# Patient Record
Sex: Male | Born: 1975 | Race: Black or African American | Hispanic: No | Marital: Married | Smoking: Former smoker
Health system: Southern US, Community
[De-identification: ages and names within clinical notes are randomized; demographics above are authoritative.]

## PROBLEM LIST (undated history)

## (undated) DIAGNOSIS — I5022 Chronic systolic (congestive) heart failure: Secondary | ICD-10-CM

## (undated) DIAGNOSIS — I255 Ischemic cardiomyopathy: Secondary | ICD-10-CM

## (undated) DIAGNOSIS — Z720 Tobacco use: Secondary | ICD-10-CM

## (undated) DIAGNOSIS — E7800 Pure hypercholesterolemia, unspecified: Secondary | ICD-10-CM

## (undated) DIAGNOSIS — I2119 ST elevation (STEMI) myocardial infarction involving other coronary artery of inferior wall: Secondary | ICD-10-CM

## (undated) DIAGNOSIS — I2699 Other pulmonary embolism without acute cor pulmonale: Secondary | ICD-10-CM

## (undated) DIAGNOSIS — G4733 Obstructive sleep apnea (adult) (pediatric): Secondary | ICD-10-CM

## (undated) DIAGNOSIS — Z9889 Other specified postprocedural states: Secondary | ICD-10-CM

## (undated) DIAGNOSIS — I340 Nonrheumatic mitral (valve) insufficiency: Secondary | ICD-10-CM

## (undated) DIAGNOSIS — E785 Hyperlipidemia, unspecified: Secondary | ICD-10-CM

## (undated) DIAGNOSIS — G43909 Migraine, unspecified, not intractable, without status migrainosus: Secondary | ICD-10-CM

## (undated) DIAGNOSIS — I2129 ST elevation (STEMI) myocardial infarction involving other sites: Secondary | ICD-10-CM

## (undated) DIAGNOSIS — I2510 Atherosclerotic heart disease of native coronary artery without angina pectoris: Secondary | ICD-10-CM

## (undated) HISTORY — DX: Atherosclerotic heart disease of native coronary artery without angina pectoris: I25.10

## (undated) HISTORY — PX: OTHER SURGICAL HISTORY: SHX169

---

## 2005-01-19 DIAGNOSIS — I2119 ST elevation (STEMI) myocardial infarction involving other coronary artery of inferior wall: Secondary | ICD-10-CM

## 2005-01-19 HISTORY — DX: ST elevation (STEMI) myocardial infarction involving other coronary artery of inferior wall: I21.19

## 2008-03-31 ENCOUNTER — Ambulatory Visit: Payer: Self-pay | Admitting: Cardiology

## 2008-03-31 ENCOUNTER — Inpatient Hospital Stay (HOSPITAL_COMMUNITY): Admission: EM | Admit: 2008-03-31 | Discharge: 2008-04-02 | Payer: Self-pay | Admitting: Emergency Medicine

## 2008-04-23 ENCOUNTER — Ambulatory Visit: Payer: Self-pay | Admitting: Cardiology

## 2008-04-23 LAB — CONVERTED CEMR LAB
Calcium: 9.8 mg/dL (ref 8.4–10.5)
Creatinine, Ser: 0.9 mg/dL (ref 0.4–1.5)
GFR calc non Af Amer: 126.28 mL/min (ref >60)

## 2008-04-26 DIAGNOSIS — I2510 Atherosclerotic heart disease of native coronary artery without angina pectoris: Secondary | ICD-10-CM | POA: Insufficient documentation

## 2008-04-26 DIAGNOSIS — J45909 Unspecified asthma, uncomplicated: Secondary | ICD-10-CM | POA: Insufficient documentation

## 2008-04-26 DIAGNOSIS — F17200 Nicotine dependence, unspecified, uncomplicated: Secondary | ICD-10-CM

## 2008-04-27 ENCOUNTER — Encounter: Payer: Self-pay | Admitting: Cardiology

## 2008-04-27 ENCOUNTER — Ambulatory Visit: Payer: Self-pay | Admitting: Cardiology

## 2008-04-27 DIAGNOSIS — E785 Hyperlipidemia, unspecified: Secondary | ICD-10-CM

## 2008-04-27 DIAGNOSIS — G4733 Obstructive sleep apnea (adult) (pediatric): Secondary | ICD-10-CM

## 2008-05-15 ENCOUNTER — Ambulatory Visit (HOSPITAL_BASED_OUTPATIENT_CLINIC_OR_DEPARTMENT_OTHER): Admission: RE | Admit: 2008-05-15 | Discharge: 2008-05-15 | Payer: Self-pay | Admitting: Cardiology

## 2008-05-28 ENCOUNTER — Ambulatory Visit: Payer: Self-pay | Admitting: Pulmonary Disease

## 2008-05-28 ENCOUNTER — Encounter: Payer: Self-pay | Admitting: Cardiology

## 2008-09-04 ENCOUNTER — Ambulatory Visit: Payer: Self-pay | Admitting: Cardiology

## 2008-09-04 DIAGNOSIS — R1310 Dysphagia, unspecified: Secondary | ICD-10-CM | POA: Insufficient documentation

## 2008-11-12 ENCOUNTER — Telehealth: Payer: Self-pay | Admitting: Cardiology

## 2008-11-20 ENCOUNTER — Telehealth: Payer: Self-pay | Admitting: Cardiology

## 2008-11-22 ENCOUNTER — Telehealth (INDEPENDENT_AMBULATORY_CARE_PROVIDER_SITE_OTHER): Payer: Self-pay | Admitting: *Deleted

## 2009-01-07 ENCOUNTER — Telehealth: Payer: Self-pay | Admitting: Cardiology

## 2009-06-04 ENCOUNTER — Ambulatory Visit: Payer: Self-pay | Admitting: Cardiology

## 2009-06-06 ENCOUNTER — Encounter: Payer: Self-pay | Admitting: Internal Medicine

## 2009-06-06 LAB — CONVERTED CEMR LAB
ALT: 34 units/L (ref 0–53)
AST: 22 units/L (ref 0–37)
Albumin: 3.9 g/dL (ref 3.5–5.2)
Cholesterol: 198 mg/dL (ref 0–200)
Direct LDL: 117.4 mg/dL
Triglycerides: 336 mg/dL — ABNORMAL HIGH (ref 0.0–149.0)

## 2009-09-09 ENCOUNTER — Encounter (INDEPENDENT_AMBULATORY_CARE_PROVIDER_SITE_OTHER): Payer: Self-pay | Admitting: *Deleted

## 2009-10-08 ENCOUNTER — Encounter (INDEPENDENT_AMBULATORY_CARE_PROVIDER_SITE_OTHER): Payer: Self-pay | Admitting: *Deleted

## 2009-11-15 ENCOUNTER — Ambulatory Visit: Payer: Self-pay | Admitting: Cardiology

## 2009-11-15 ENCOUNTER — Encounter: Payer: Self-pay | Admitting: Cardiology

## 2009-11-21 ENCOUNTER — Encounter: Payer: Self-pay | Admitting: Cardiology

## 2009-11-21 LAB — CONVERTED CEMR LAB
ALT: 33 units/L (ref 0–53)
Alkaline Phosphatase: 83 units/L (ref 39–117)
Bilirubin, Direct: 0.1 mg/dL (ref 0.0–0.3)
Direct LDL: 149.8 mg/dL
Total Bilirubin: 0.9 mg/dL (ref 0.3–1.2)
Total Protein: 7.1 g/dL (ref 6.0–8.3)

## 2009-12-05 ENCOUNTER — Telehealth: Payer: Self-pay | Admitting: Cardiology

## 2010-02-18 NOTE — Miscellaneous (Signed)
Summary: refills on meds  Clinical Lists Changes  Medications: Rx of SIMVASTATIN 80 MG TABS (SIMVASTATIN) 1 by mouth daily;  #30 x 11;  Signed;  Entered by: Charolotte Capuchin, RN;  Authorized by: Rollene Rotunda, MD, [facility];  Method used: Electronically to [facility]. [phone]*, [address], [place], [place]  [address], Ph: [phone], Fax: [phone] Rx of METOPROLOL TARTRATE 25 MG TABS (METOPROLOL TARTRATE) 1 by mouth daily;  #30 x 11;  Signed;  Entered by: Charolotte Capuchin, RN;  Authorized by: Rollene Rotunda, MD, [facility];  Method used: Electronically to [facility]. [phone]*, [address] [place], [place]  [address], Ph: [phone], Fax: [phone] Rx of PLAVIX 75 MG TABS (CLOPIDOGREL BISULFATE) ONE DAILY;  #30 x 11;  Signed;  Entered by: Charolotte Capuchin, RN;  Authorized by: Rollene Rotunda, MD, [facility];  Method used: Electronically to [facility] [address] [phone]*, [address] [place], [place]  [address], Ph: [phone], Fax: [phone]    Prescriptions: PLAVIX 75 MG TABS (CLOPIDOGREL BISULFATE) ONE DAILY  #30 x 11   Entered by:   Charolotte Capuchin, RN   Authorized by:   Rollene Rotunda, MD, [facility]   Signed by:   Charolotte Capuchin, RN on 11/15/2009   Method used:   Electronically to        [facility] [address] [phone]* (retail)       [address] [place], [place]  [address]       Ph: [phone]       Fax: [phone]   RxID:   [id number] METOPROLOL TARTRATE 25 MG TABS (METOPROLOL TARTRATE) 1 by mouth daily  #30 x 11   Entered by:   Charolotte Capuchin, RN   Authorized by:   Rollene Rotunda, MD, [facility]   Signed by:   Charolotte Capuchin, RN on 11/15/2009   Method used:   Electronically to        [facility] [address] [phone]* (retail)       [address] [place], [place]  [address]       Ph: [phone]       Fax: [phone]   RxID:   [id number] SIMVASTATIN 80 MG TABS (SIMVASTATIN) 1 by  mouth daily  #30 x 11   Entered by:   Charolotte Capuchin, RN   Authorized by:   Rollene Rotunda, MD, [facility]   Signed by:   Charolotte Capuchin, RN on 11/15/2009   Method used:   Electronically to        [facility] [address] [phone]* (retail)       [address] [place], [place]  [address]       Ph: [phone]       Fax: [phone]   RxID:   [id number]

## 2010-02-18 NOTE — Miscellaneous (Signed)
Summary: RX for lipitor 80 mg  Clinical Lists Changes  Medications: Removed medication of SIMVASTATIN 80 MG TABS (SIMVASTATIN) 1 by mouth daily Added new medication of LIPITOR 80 MG TABS (ATORVASTATIN CALCIUM) one daily - Signed Rx of LIPITOR 80 MG TABS (ATORVASTATIN CALCIUM) one daily;  #30 x 11;  Signed;  Entered by: Charolotte Capuchin, RN;  Authorized by: Rollene Rotunda, MD, [facility];  Method used: Electronically to [facility]. [phone]*, [address] [place], [place]  [address], Ph: [phone], Fax: [phone]    Prescriptions: LIPITOR 80 MG TABS (ATORVASTATIN CALCIUM) one daily  #30 x 11   Entered by:   Charolotte Capuchin, RN   Authorized by:   Rollene Rotunda, MD, [facility]   Signed by:   Charolotte Capuchin, RN on 11/21/2009   Method used:   Electronically to        [facility] [address] [phone]* (retail)       [address] [place], [place]  [address]       Ph: [phone]       Fax: [phone]   RxID:   [id number]

## 2010-02-18 NOTE — Letter (Signed)
Summary: Lab Test Reminder  [facility], Main Office  [address]   [place], [place] [address]   Phone: [phone]  Fax: [phone]         November 21, 2009 MRN: [id number]   Joshua May [address] [place], [place]  [address]   Dear [name],   Dr.  Antoine Poche requests you have some follow-up blood work (lipid and liver) which will be due in 2 months.  Please do not eat or drink after midnight the day of you come for the lab work.   It is important that patients and their doctor work together in the management/treatment of their health care.     Thanks,     Sander Nephew, RN for Dr Rollene Rotunda [facility]

## 2010-02-18 NOTE — Miscellaneous (Signed)
Summary: Orders Update  Clinical Lists Changes  Medications: Removed medication of EFFIENT 10 MG TABS (PRASUGREL HCL) OUT Added new medication of PLAVIX 75 MG TABS (CLOPIDOGREL BISULFATE) ONE DAILY - Signed Rx of PLAVIX 75 MG TABS (CLOPIDOGREL BISULFATE) ONE DAILY;  #30 x 11;  Signed;  Entered by: Charolotte Capuchin, RN;  Authorized by: Rollene Rotunda, MD, [facility];  Method used: Electronically to [facility]. [phone]*, [address] [place], [place]  [address], Ph: [phone], Fax: [phone] Rx of SIMVASTATIN 80 MG TABS (SIMVASTATIN) OUT;  #30 x 11;  Signed;  Entered by: Charolotte Capuchin, RN;  Authorized by: Rollene Rotunda, MD, [facility];  Method used: Electronically to [facility]. [phone]*, [address], [place], [place]  [address], Ph: [phone], Fax: [phone] Rx of METOPROLOL TARTRATE 25 MG TABS (METOPROLOL TARTRATE) OUT;  #30 x 11;  Signed;  Entered by: Charolotte Capuchin, RN;  Authorized by: Rollene Rotunda, MD, [facility];  Method used: Electronically to [facility]. [phone]*, [address], [place], [place]  [address], Ph: [phone], Fax: [phone] Orders: Added new Service order of EKG w/ Interpretation (93[address]) - Signed Added new Test order of TLB-Lipid Panel (80061-LIPID) - Signed Added new Test order of TLB-Hepatic/Liver Function Pnl (80076-HEPATIC) - Signed Observations: Added new observation of PI CARDIO: Your physician recommends that you schedule a follow-up appointment in: 3 MONTHS WITH DR [facility] Your physician recommends that you havelab work today.  Fasting Lipid and liver profile Have a "verify now blood test" in one week at the hospital Your physician has recommended you make the following change in your medication: Stop Effient and start Plavix 75 mg a day Your physician discussed the hazards of tobacco use.  Tobacco use cessation is recommended and techniques and options to help you quit were discussed. (06/04/2009  10:13)    Prescriptions: METOPROLOL TARTRATE 25 MG TABS (METOPROLOL TARTRATE) OUT  #30 x 11   Entered by:   Charolotte Capuchin, RN   Authorized by:   Rollene Rotunda, MD, [facility]   Signed by:   Charolotte Capuchin, RN on 06/04/2009   Method used:   Electronically to        [facility] [address] [phone]* (retail)       [address] [place], [place]  [address]       Ph: [phone]       Fax: [phone]   RxID:   [id number] SIMVASTATIN 80 MG TABS (SIMVASTATIN) OUT  #30 x 11   Entered by:   Charolotte Capuchin, RN   Authorized by:   Rollene Rotunda, MD, [facility]   Signed by:   Charolotte Capuchin, RN on 06/04/2009   Method used:   Electronically to        [facility] [address] [phone]* (retail)       [address] [place], [place]  [address]       Ph: [phone]       Fax: [phone]   RxID:   [id number] PLAVIX 75 MG TABS (CLOPIDOGREL BISULFATE) ONE DAILY  #30 x 11   Entered by:   Charolotte Capuchin, RN   Authorized by:   Rollene Rotunda, MD, [place]   Signed by:   Charolotte Capuchin, RN on 06/04/2009   Method used:   Electronically to        [facility]. [phone]* (retail)       [address]  [place], [place]  [address]       Ph: [phone]       Fax: [phone]   RxID:   [id number]    Patient Instructions: 1)  Your physician recommends that you schedule a follow-up appointment in: 3 MONTHS WITH [facility] 2)  Your physician recommends that you havelab work today.  Fasting Lipid and liver profile 3)  Have a "verify now blood test" in one week at the hospital 4)  Your physician has recommended you make the following change in your medication: Stop Effient and start Plavix 75 mg a day 5)  Your physician discussed the hazards of tobacco use.  Tobacco use cessation is recommended and techniques and options to help you quit were discussed.

## 2010-02-18 NOTE — Letter (Signed)
Summary: Appointment - Missed  [facility], Main Office  [address]   [place], [place] [address]   Phone: [phone]  Fax: [phone]     September 09, 2009 MRN: [id number]   Joshua May [address] [place], [place]  [address]   Dear [name],  Our records indicate you missed your appointment on 09-09-2009 with  Dr. Antoine Poche .  It is very important that we reach you to reschedule this appointment. We look forward to participating in your health care needs. Please contact us at the number listed above at your earliest convenience to reschedule this appointment.     Sincerely,   Lorne Skeens  [facility] Scheduling Team

## 2010-02-18 NOTE — Progress Notes (Signed)
Summary: Need new referral  Phone Note Call from Patient Call back at Home Phone [phone]   Caller: Patient Summary of Call: Pt needs a new referral for another MD that takes [facility] Initial call taken by: Judie Grieve,  December 05, 2009 11:35 AM  Follow-up for Phone Call        pt will need to call number on his insurance card to find of the name of MD that takes his insurance.  left message for pt to do so and call back with the information Follow-up by: Charolotte Capuchin, RN,  December 05, 2009 12:35 PM

## 2010-02-18 NOTE — Letter (Signed)
Summary: Appointment - Missed  [facility], Main Office  [address]   [place], [place] [address]   Phone: [phone]  Fax: [phone]     October 08, 2009 MRN: [id number]   Joshua May [address] [place], [place]  [address]   Dear [name],  Our records indicate you missed your appointment on 09/09/2009 at 09:45am with Dr. Antoine Poche. It is very important that we reach you to reschedule this appointment. We look forward to participating in your health care needs. Please contact us at the number listed above at your earliest convenience to reschedule this appointment.     Sincerely,  Neurosurgeon Team  GD

## 2010-02-18 NOTE — Assessment & Plan Note (Signed)
Summary: rov   Visit Type:  Follow-up  CC:  CAD.  History of Present Illness: The patient followup of his known coronary disease. Since I last saw him he has been doing well. He continues to smoke 2 cigarettes daily. He every day. With this he denies any chest discomfort, neck or arm discomfort. He has no palpitations, presyncope or syncope. He has no PND or orthopnea. He hast lost several pounds.  He does complain of headaches when the lights are first turned on in the morning. He also has occasional dyspnea which he relates to his asthma. He has not been seeing his primary care doctor.  Current Medications (verified): 1)  Bayer Aspirin 325 Mg Tabs (Aspirin) .... Take 1 Once Daily 2)  Simvastatin 80 Mg Tabs (Simvastatin) .Marland Kitchen.. 1 By Mouth Daily 3)  Metoprolol Tartrate 25 Mg Tabs (Metoprolol Tartrate) .Marland Kitchen.. 1 By Mouth Daily 4)  Plavix 75 Mg Tabs (Clopidogrel Bisulfate) .... One Daily  Allergies (verified): No Known Drug Allergies  Past History:  Past Medical History: Reviewed history from 04/26/2008 and no changes required.  1. 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 No significant CAD in the left main, LAD, or left circumflex.Mild segmental LV dysfunction with overall preserved LVEF. Successful PCI using a single bare-metal stent.   2. Asthma.   Past Surgical History: Reviewed history from 04/26/2008 and no changes required. None.   Review of Systems       As stated in the HPI and negative for all other systems.   Vital Signs:  Patient profile:   35 year old male Height:      73 inches Weight:      238 pounds BMI:     31.51 Pulse rate:   72 / minute Resp:     16 per minute BP sitting:   122 / 80  (right arm)  Vitals Entered By: Marrion Coy, CNA (November 15, 2009 9:02 AM)  Physical Exam  General:  Well developed, well nourished, in no acute distress. Head:  normocephalic and atraumatic Mouth:  Teeth, gums and palate normal. Oral mucosa  normal. Neck:  Neck supple, no JVD. No masses, thyromegaly or abnormal cervical nodes. Chest Wall:  no deformities or breast masses noted Lungs:  Clear bilaterally to auscultation and percussion. Heart:  Non-displaced PMI, chest non-tender; regular rate and rhythm, S1, S2 without murmurs, rubs or gallops. Carotid upstroke normal, no bruit. Normal abdominal aortic size, no bruits. Femorals normal pulses, no bruits. Pedals normal pulses. No edema, no varicosities. Abdomen:  Bowel sounds positive; abdomen soft and non-tender without masses, organomegaly, or hernias noted. No hepatosplenomegaly, obese Msk:  Back normal, normal gait. Muscle strength and tone normal. Extremities:  No clubbing or cyanosis. Neurologic:  Alert and oriented x 3. Skin:  Intact without lesions or rashes. Cervical Nodes:  no significant adenopathy Inguinal Nodes:  no significant adenopathy Psych:  Normal affect.   EKG  Procedure date:  11/15/2009  Findings:      Reviewed today and reported elsewhere.  Impression & Recommendations:  Problem # 1:  CAD (ICD-414.00) He is having no ongoing chest pain. No further cardiovascular testing is suggested. Orders: EKG w/ Interpretation (93000)  Problem # 2:  TOBACCO ABUSE (ICD-305.1) I again discussed the need to stop smoking completely  Problem # 3:  SLEEP APNEA (ICD-780.57) I review studies. There was no indication for treatment of mild sleep disturbance.  Problem # 4:  DYSLIPIDEMIA (ICD-272.4) I reviewed his lipids from  May and his LDL was 117. He has changed his lifestyle. I will repeat lipid and liver today. Orders: TLB-Lipid Panel (80061-LIPID) TLB-Hepatic/Liver Function Pnl (80076-HEPATIC)  Problem # 5:  ASTHMA (ICD-493.90) I will refer him for an primary care physician visit at his request for followup asthma and headaches. He is looking for a new primary M.D.  Other Orders: Primary Care Referral (Primary)  Patient Instructions: 1)  Your physician  recommends that you schedule a follow-up appointment in: 6 months with Dr Antoine Poche 2)  Your physician recommends that you return for a FASTING lipid and liver profile: today  272.0 v58.69 3)  Your physician recommends that you continue on your current medications as directed. Please refer to the Current Medication list given to you today. 4)  You have been referred to primary care to establish care

## 2010-02-18 NOTE — Letter (Signed)
Summary: Custom - Lipid  [facility], Main Office  [address]   [place], [place] [address]   Phone: [phone]  Fax: [phone]         Jun 06, 2009 MRN: [id number]     Joshua May [address] [place], [place]  [address]     Dear [name],  We have reviewed your cholesterol results.  They are as follows:     Total Cholesterol:    198 (Desirable: less than 200)       HDL  Cholesterol:     30.60  (Desirable: greater than 40 for men and 50 for women)       LDL Cholesterol:         (Desirable: less than 100 for low risk and less than 70 for moderate to high risk)       Triglycerides:       336.0  (Desirable: less than 150)  Our recommendations include:  Continue Zocor 40 mg, decrease fats in diet, repeat lab working in 10 weeks.   Call our office at the number listed above if you have any questions.  Lowering your LDL cholesterol is important, but it is only one of a large number of "risk factors" that may indicate that you are at risk for heart disease, stroke or other complications of hardening of the arteries.  Other risk factors include:   A.  Cigarette Smoking* B.  High Blood Pressure* C.  Obesity* D.   Low HDL Cholesterol (see yours above)* E.   Diabetes Mellitus (higher risk if your is uncontrolled) F.  Family history of premature heart disease G.  Previous history of stroke or cardiovascular disease          *These are risk factors YOU HAVE CONTROL OVER.  For more information, visit .  There is now evidence that lowering the TOTAL CHOLESTEROL AND LDL CHOLESTEROL can reduce the risk of heart disease.  The American Heart Association recommends the following guidelines for the treatment of elevated cholesterol:  1.  If there is now current heart disease and less than two risk factors, TOTAL CHOLESTEROL should be less than 200 and LDL CHOLESTEROL should be less than 100. 2.  If there is current heart disease or two or more risk  factors, TOTAL CHOLESTEROL should be less than 200 and LDL CHOLESTEROL should be less than 70.  A diet low in cholesterol, saturated fat, and calories is the cornerstone of treatment for elevated cholesterol.  Cessation of smoking and exercise are also important in the management of elevated cholesterol and preventing vascular disease.  Studies have shown that 30 to 60 minutes of physical activity most days can help lower blood pressure, lower cholesterol, and keep your weight at a healthy level.  Drug therapy is used when cholesterol levels do not respond to therapeutic lifestyle changes (smoking cessation, diet, and exercise) and remains unacceptably high.  If medication is started, it is important to have you levels checked periodically to evaluate the need for further treatment options.      Thank you,     Charolotte Capuchin, RN for Dr. Rollene Rotunda [facility] Team

## 2010-02-18 NOTE — Assessment & Plan Note (Signed)
Summary: f64m/jss   Visit Type:  Follow-up Primary Provider:  Billee Cashing, MD  CC:  CAD.  History of Present Illness: The patient presents for followup of his known coronary disease. Unfortunately since I last saw him he has come off of his medications. He said the pharmacy would not give him his Effient.  He subsequently stopped taking everything but the aspirin. He denies any chest discomfort, neck or arm discomfort. He is not having any palpitations, presyncope or syncope. He's not having any PND or orthopnea.  Current Medications (verified): 1)  Bayer Aspirin 325 Mg Tabs (Aspirin) .... Take 1 Once Daily 2)  Effient 10 Mg Tabs (Prasugrel Hcl) .... Out 3)  Simvastatin 80 Mg Tabs (Simvastatin) .... Out 4)  Metoprolol Tartrate 25 Mg Tabs (Metoprolol Tartrate) .... Out  Allergies (verified): No Known Drug Allergies  Past History:  Past Medical History: Reviewed history from 04/26/2008 and no changes required.  1. 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 No significant CAD in the left main, LAD, or left circumflex.Mild segmental LV dysfunction with overall preserved LVEF. Successful PCI using a single bare-metal stent.   2. Asthma.   Review of Systems       As stated in the HPI and negative for all other systems.   Vital Signs:  Patient profile:   35 year old male Height:      73 inches Weight:      247 pounds BMI:     32.71 Pulse rate:   72 / minute Resp:     16 per minute BP sitting:   118 / 88  (right arm)  Vitals Entered By: Marrion Coy, CNA (Jun 04, 2009 9:41 AM)  Physical Exam  General:  Well developed, well nourished, in no acute distress. Head:  normocephalic and atraumatic Mouth:  Teeth, gums and palate normal. Oral mucosa normal. Neck:  Neck supple, no JVD. No masses, thyromegaly or abnormal cervical nodes. Chest Wall:  no deformities or breast masses noted Lungs:  Clear bilaterally to auscultation and percussion. Heart:   Non-displaced PMI, chest non-tender; regular rate and rhythm, S1, S2 without murmurs, rubs or gallops. Carotid upstroke normal, no bruit. Normal abdominal aortic size, no bruits. Femorals normal pulses, no bruits. Pedals normal pulses. No edema, no varicosities. Abdomen:  Bowel sounds positive; abdomen soft and non-tender without masses, organomegaly, or hernias noted. No hepatosplenomegaly. Msk:  Back normal, normal gait. Muscle strength and tone normal. Extremities:  No clubbing or cyanosis. Neurologic:  Alert and oriented x 3. Skin:  Intact without lesions or rashes. Psych:  Normal affect.   EKG  Procedure date:  06/04/2009  Findings:      normal sinus rhythm, rate 74, old inferior infarct, rightward axis, inferolateral T-wave inversions  Impression & Recommendations:  Problem # 1:  TOBACCO ABUSE (ICD-305.1) I have again counseled the patient on the need to stop smoking (greater than 3 minutes).  Problem # 2:  DYSLIPIDEMIA (ICD-272.4) I will check a lipid profile today although it is a little bit difficult to interpret this as he's not been taking his statin. I will however probably not choose simvastatin 80 mg again and we'll determine her medication based on the results of the above testing.  Problem # 3:  CAD (ICD-414.00) I will put the patient on Plavix as Medicaid will not pay for the Effient.  I will check a platelet reactivity test to verify that the drug is effective.  Appended Document: f11m/jss Put patient  on Zocor 40 mg daily.  Decrease fat in diet and check lipids and liver in 10 weeks.  Appended Document: f28m/jss letter mailed with orders

## 2010-05-01 LAB — COMPREHENSIVE METABOLIC PANEL
ALT: 41 U/L (ref 0–53)
Albumin: 3.9 g/dL (ref 3.5–5.2)
Alkaline Phosphatase: 99 U/L (ref 39–117)
Chloride: 107 mEq/L (ref 96–112)
Glucose, Bld: 124 mg/dL — ABNORMAL HIGH (ref 70–99)
Potassium: 3.9 mEq/L (ref 3.5–5.1)
Sodium: 140 mEq/L (ref 135–145)
Total Protein: 7 g/dL (ref 6.0–8.3)

## 2010-05-01 LAB — BASIC METABOLIC PANEL
BUN: 11 mg/dL (ref 6–23)
Calcium: 8.6 mg/dL (ref 8.4–10.5)
Creatinine, Ser: 0.83 mg/dL (ref 0.4–1.5)
GFR calc non Af Amer: >60 mL/min (ref >60)
Glucose, Bld: 104 mg/dL — ABNORMAL HIGH (ref 70–99)

## 2010-05-01 LAB — CBC
Hemoglobin: 14.1 g/dL (ref 13.0–17.0)
Platelets: 254 10*3/uL (ref 150–400)
RDW: 12.9 % (ref 11.5–15.5)
RDW: 13.2 % (ref 11.5–15.5)
WBC: 7.4 10*3/uL (ref 4.0–10.5)

## 2010-05-01 LAB — CARDIAC PANEL(CRET KIN+CKTOT+MB+TROPI)
CK, MB: 51 ng/mL — ABNORMAL HIGH (ref 0.3–4.0)
CK, MB: 63.1 ng/mL — ABNORMAL HIGH (ref 0.3–4.0)
Relative Index: 7.4 — ABNORMAL HIGH (ref 0.0–2.5)
Relative Index: 8.5 — ABNORMAL HIGH (ref 0.0–2.5)
Troponin I: 5.14 ng/mL (ref 0.00–0.06)

## 2010-05-01 LAB — LIPID PANEL
Cholesterol: 198 mg/dL (ref 0–200)
LDL Cholesterol: 112 mg/dL — ABNORMAL HIGH (ref 0–99)
VLDL: 60 mg/dL — ABNORMAL HIGH (ref 0–40)

## 2010-05-01 LAB — LIPASE, BLOOD: Lipase: 19 U/L (ref 11–59)

## 2010-05-01 LAB — HEMOGLOBIN A1C: Mean Plasma Glucose: 94 mg/dL

## 2010-05-01 LAB — POCT CARDIAC MARKERS: Troponin i, poc: <0.05 ng/mL (ref 0.00–0.09)

## 2010-06-03 NOTE — Procedures (Signed)
Joshua May, Joshua May                 ACCOUNT NO.:  [id number]   MEDICAL RECORD NO.:  [id number]          PATIENT TYPE:  OUT   LOCATION:  SLEEP CENTER                 FACILITY[facility]   PHYSICIAN:  Barbaraann Share, MD,FCCPDATE OF BIRTH:  12-12-76   DATE OF STUDY:  05/28/2008                            NOCTURNAL POLYSOMNOGRAM   REFERRING PHYSICIAN:  Rollene Rotunda, MD, [facility]   INDICATION OF THE STUDY:  Hypersomnia with sleep apnea.   EPWORTH SCORE:  12.   SLEEP ARCHITECTURE:  The patient had total sleep time of 371 minutes  with no slow-wave sleep and decreased quantity of REM.  Sleep onset  latency was normal at 7.5 minutes, and REM onset was prolonged at 271  minutes.  Sleep efficiency was fairly good at 92%.   RESPIRATORY DATA:  The patient was noted to have 5 apneas and 10  obstructive hypopneas for an AHI of 2 events per hour.  The events  occurred in all body positions, and there was moderate snoring noted  throughout.  The patient did not meet split-night protocol secondary to  the small numbers of events.   OXYGEN DATA:  There was O2 desaturation transiently as low as 91%.   CARDIAC DATA:  No clinically significant arrhythmias were noted.   MOVEMENT/PARASOMNIA:  The patient was found to have no leg jerks or  other abnormalities seen with regard to behaviors.   IMPRESSION/RECOMMENDATIONS:  Small numbers of obstructive events, which  do not meet the AHI criteria for the obstructive sleep apnea syndrome.  I suspect from the patient's history that he has symptomatic snoring,  and would encourage him to work aggressively on weight loss and also to  avoid the supine sleeping position.      Barbaraann Share, MD,FCCP  Diplomate, American Board of Sleep  Medicine  Electronically Signed     [facility]  D:  05/28/2008 06:13:48  T:  05/28/2008 21:36:30  Job:  [id number]

## 2010-06-03 NOTE — Discharge Summary (Signed)
Joshua May, Joshua May                 ACCOUNT NO.:  [id number]   MEDICAL RECORD NO.:  [id number]          PATIENT TYPE:  INP   LOCATION:  2015                         FACILITY:  [facility]   PHYSICIAN:  Rollene Rotunda, MD, FACCDATE OF BIRTH:  02/02/1975   DATE OF ADMISSION:  03/31/2008  DATE OF DISCHARGE:  04/02/2008                               DISCHARGE SUMMARY   PRIMARY CARDIOLOGIST:  Rollene Rotunda, MD, [facility]   PRIMARY INTERVENTIONAL CARDIOLOGIST:  Veverly Fells. Excell Seltzer, MD   DISCHARGE DIAGNOSIS:  Inferior ST elevation myocardial infarction.   SECONDARY DIAGNOSES:  1. Coronary artery disease, history of myocardial infarction with      percutaneous coronary intervention January 18, 2006, ? stent.  2. Hyperlipidemia.  3. Asthma.  4. Tobacco abuse disorder, ongoing.   ALLERGIES:  NKDA.   PROCEDURES PERFORMED DURING THIS HOSPITALIZATION:  The patient had EKG  that was performed on March 31, 2008 that showed minimal ST elevation in  lead 2 and approximately 1.5 mm ST elevation in lead 3 and aVF.  T-wave  inversions in V5, V6, and one in aVL.  Otherwise, no acute ST-T wave  changes, possibly significant Q-waves in lead 3 and aVF.  Otherwise  normal sinus rhythm at the rate of 84 beats per minute.  No evidence of  hypertrophy.  Normal axis.  PR 152, QRS 86, and QTC 402.  The patient  had chest x-ray done on March 31, 2008.  It showed no acute process  findings.  The patient had urgent cardiac catheterization in March 2010  that showed:  1. Acute inferior MI secondary to RCA occlusion.  2. No significant CAD in the left main, LAD, or left circumflex.  3. Mild segmental LV dysfunction with overall preserved LVEF.  4. Successful PCI using a single bare-metal stent.   The patient had EKG performed on April 01, 2008, that showed no acute ST-  T wave changes, significant Q waves in 2, 3, and aVF.  No evidence of  hypertrophy.  Normal axis, otherwise normal sinus rhythm with a rate of  75 beats  per minute, PR 144, QRS 92, QTC 399.   HISTORY OF PRESENT ILLNESS:  The patient is a 35 year old African  American male with a known history of coronary artery disease status  post MIs and PCI (? stent) 01/18/06.  The patient was to be taking  Plavix; however, he has been noncompliant.  He developed recurrent chest  pain at 4 a.m. on date of admission, similar to prior angina.  He  presented to the ER at 7:21 a.m.  He was noted to have inferior ST  elevation.  He was treated with nitroglycerin and enteric-coated aspirin  and morphine.   HOSPITAL COURSE:  The patient was taken urgently to the cardiac cath lab  and underwent cardiac cath and PCI (see above).  The patient tolerated  the procedure well without any significant complications.  The patient's  symptoms significantly improved after cardiac cath and was asymptomatic  on the 14th, remaining so for the rest of his hospital course.  The  patient's vital signs  remained stable, and he was deemed ready for  discharge on April 02, 2008.  Prior to his discharge, the patient was  enrolled in the PARIS clinical trial.  The patient had risks and  benefits explained to him, and we signed the informed consent form on  the day of discharge.  Prior to discharge, the patient was found to  admit inclusion/exclusion criteria for the PARIS clinical trial.  The  patient was explained with benefits of enrollment into the trial, and he  agreed to participate.  He signed the informed consent document along  with the study personnel and received a copy.  The patient will be  contacted by study personnel regarding following up per protocol.  The  patient also received smoking cessation counseling and has agreed to  quit smoking.  He will be given a prescription for 21 mg smoking  cessation nicotine patch on discharge along with rest of his pain  medications.  The patient has been made a followup appointment with  [facility].  The patient received  all of his post-cath  instructions, followup instructions, and new medication list along with  prescriptions in both the oral and written form.  At the time of  discharge, he had no questions or concerns that had not been addressed.  Most recent vital signs on date of discharge:  Temperature 97.0 degrees  Fahrenheit,  BP 124/80, pulse 81, respiration rate 20, his O2 saturation  was 100% on room air.   DISCHARGE LABS:  WBC 10.1, HGB 13.2, HCT 38.9, PLT count 234.  Sodium  140, potassium 3.8, chloride 107, CO2 of 28, BUN 11, creatinine 0.83,  glucose 104.  Total bilirubin 0.6, alkaline phosphatase 99, AST 31, ALT  41, total protein 7.0, albumin 3.9, calcium 8.6, lipase 19.  Hemoglobin  A1c 4.9.  Total cholesterol 198, triglycerides 299, HDL 26, LDL 112,  VLDL 60, cholesterol/HDL ratio 7.6, TSH 0.674.   FOLLOWUP PLANS AND APPOINTMENT:  The patient has a followup appointment  for lab work between April 2nd and April 6th anytime of day on any date  except weekend.  The patient has a followup appointment with Dr.  Antoine Poche for April 27, 2008, at 3:30 p.m.   DISCHARGE MEDICATIONS:  1. Enteric-coated aspirin 325 mg p.o. daily.  2. Prasugrel 10 mg p.o. daily.  3. Simvastatin 80 mg p.o. daily.  4. Metoprolol tartarate 25 mg p.o. b.i.d.  5. Nicotine patch 21 mg apply daily.   DURATION OF THIS DISCHARGE ENCOUNTER:  Including physician time is 45  minutes.      Joshua May, [facility]      Rollene Rotunda, MD, [facility]  Electronically Signed    MS/MEDQ  D:  04/02/2008  T:  04/03/2008  Job:  [id number]   cc:   Veverly Fells. Excell Seltzer, MD

## 2010-06-06 NOTE — H&P (Signed)
Joshua May, Joshua May                 ACCOUNT NO.:  [id number]   MEDICAL RECORD NO.:  [id number]          PATIENT TYPE:  INP   LOCATION:                               FACILITY:  [facility]   PHYSICIAN:  Rollene Rotunda, MD, FACCDATE OF BIRTH:  [birth year]   DATE OF ADMISSION:  03/31/2008  DATE OF DISCHARGE:  04/02/2008                              HISTORY & PHYSICAL   PRIMARY CARE PHYSICIAN:  Lorelle Formosa, MD   REASON FOR PRESENTATION:  Evaluate the patient with chest pain.   HISTORY OF PRESENT ILLNESS:  The patient is a 35 year old African  American gentleman who reports a previous stent on January 18, 2006.  This was for an acute coronary event apparently in [place].  He has  moved to this area recently.  He has not been taking his medications.  He says he was taking Plavix for a while.  He was taking Coumadin at  some point, though he does not remember why.  He developed recurrent  chest discomfort at 4:00 a.m.  He was asleep.  This was similar to his  previous angina.  It was substernal and severe.  It was associated with  nausea.  He looks to be in moderate distress.  He has no radiation to  his jaw or arms.  He is having some shortness of breath.  He presented  to the emergency room at 7:21 where he was noted to have inferior ST-  segment elevation.  He has been treated with aspirin, nitroglycerin, and  morphine without significant improvement.   PAST MEDICAL HISTORY:  1. Coronary artery disease (details pending).  2. Asthma.   PAST SURGICAL HISTORY:  None.   ALLERGIES:  None.   MEDICATIONS:  Lipitor, metoprolol, and Plavix (the patient was not  taking any of these and does not recall the doses).   FAMILY HISTORY:  The patient reports a sister with a myocardial  infarction in her 30s.   SOCIAL HISTORY:  The patient is married with 2 children.  He smokes less  than 1 pack per day.   REVIEW OF SYSTEMS:  As stated in the HPI and negative for all other  systems.   PHYSICAL EXAMINATION:  GENERAL:  The patient is in moderate distress.  VITAL SIGNS:  Blood pressure 124/81, heart rate 79 and regular,  afebrile, respiratory rate 18.  HEENT: Eyelids unremarkable, pupils equal, round, and reactive to light,  fundi not visualized, oral mucosa unremarkable.  NECK:  No jugular venous distention at 45 degrees, carotid upstroke  brisk and symmetric, no bruits, no thyromegaly.  LYMPHATICS:  No cervical, axillary, or inguinal adenopathy.  LUNGS:  Clear to auscultation bilaterally.  BACK:  No costovertebral angle tenderness.  CHEST:  Unremarkable.  HEART:  PMI not displaced or sustained, S1 and S2 within normal limits,  no S3, no S4, no clicks, no rubs, no murmurs.  ABDOMEN:  Obese, positive bowel sounds, normal in frequency and pitch,  no bruits, no rebound, no guarding, no midline pulsatile mass, no  hepatomegaly, no splenomegaly.  SKIN:  No rashes, no nodules.  EXTREMITIES:  Pulses 2+ throughout, no edema, no cyanosis, no clubbing.  NEUROLOGIC:  Oriented to person, place, and time, cranial nerves II  through XII grossly intact, motor grossly intact.   EKG:  Rate 84, inferior ST-segment elevation of 1.5 mm in II, III, aVF,  Q's in III and aVF.  Labs pending.  Chest x-ray pending.   ASSESSMENT AND PLAN:  1. Inferior myocardial infarction.  The patient will be taken urgently      to the cath lab.  The risk and benefits have been explained, and he      understands.  This will be per Dr. Excell Seltzer.  2. Asthma.  He tolerated beta-blockers in the past and should be      restarted on these.  3. Risk reduction.  We will discuss stopping smoking.  We will check a      lipid profile and treat accordingly.      Rollene Rotunda, MD, [facility]  Electronically Signed     JH/MEDQ  D:  04/05/2008  T:  04/05/2008  Job:  [id number]   cc:   Lorelle Formosa, M.D.

## 2010-06-13 ENCOUNTER — Encounter: Payer: Self-pay | Admitting: Cardiology

## 2010-06-25 ENCOUNTER — Ambulatory Visit (INDEPENDENT_AMBULATORY_CARE_PROVIDER_SITE_OTHER): Payer: Self-pay | Admitting: Cardiology

## 2010-06-25 ENCOUNTER — Encounter: Payer: Self-pay | Admitting: Cardiology

## 2010-06-25 VITALS — BP 124/88 | HR 76 | Resp 18 | Ht 71.0 in | Wt 229.0 lb

## 2010-06-25 DIAGNOSIS — I2510 Atherosclerotic heart disease of native coronary artery without angina pectoris: Secondary | ICD-10-CM

## 2010-06-25 DIAGNOSIS — F17200 Nicotine dependence, unspecified, uncomplicated: Secondary | ICD-10-CM

## 2010-06-25 DIAGNOSIS — E785 Hyperlipidemia, unspecified: Secondary | ICD-10-CM

## 2010-06-25 NOTE — Patient Instructions (Signed)
Continue your current medications as listed Please return for fasting lipid panel Follow up with Dr Antoine Poche in 6 months

## 2010-06-25 NOTE — Assessment & Plan Note (Signed)
The patient has had no new symptoms. We will continue with risk reduction.

## 2010-06-25 NOTE — Progress Notes (Signed)
HPI The patient presents for followup of his known coronary disease. Since I last saw him he has done well. He has been playing basketball routinely. With this he denies any chest discomfort, neck or arm discomfort. He has had no palpitations, presyncope or syncope. He has had no weight gain or edema. He continues to smoke a few cigarettes. He is watching his diet. He did have a markedly elevated LDL last fall and I started him on Lipitor. He has not had followup lipids since then but has tolerated this medication.  No Known Allergies  Current Outpatient Prescriptions  Medication Sig Dispense Refill  . aspirin 325 MG tablet Take 325 mg by mouth daily.        Marland Kitchen atorvastatin (LIPITOR) 80 MG tablet Take 80 mg by mouth daily.        . clopidogrel (PLAVIX) 75 MG tablet Take 75 mg by mouth daily.        . metoprolol tartrate (LOPRESSOR) 25 MG tablet 1 TAB BY MOUTH DAILY         Past Medical History  Diagnosis Date  . CAD (coronary artery disease)     ACUTE,INFERIOR MI SECONDARY TO RCA OCCLUSION. NO SIGNIFICANT CAD IN THE LEFT MAIN,LAD, OR LEFT CIRCUMFLEX. MILD SEGMENTAL LV DYSFUNCTION WITH OVERALL PRESERVED LVEF. SUCCESSFUL PCI USING A SINGLE BARE-METAL STENT.  Marland Kitchen Asthma     Past Surgical History  Procedure Date  . Other surgical history     NONE    ROS:  As stated in the HPI and negative for all other systems.  PHYSICAL EXAM BP 124/88  Pulse 76  Resp 18  Ht 5\' 11"  (1.803 m)  Wt 229 lb (103.874 kg)  BMI 31.94 kg/m2 GENERAL:  Well appearing HEENT:  Pupils equal round and reactive, fundi not visualized, oral mucosa unremarkable NECK:  No jugular venous distention, waveform within normal limits, carotid upstroke brisk and symmetric, no bruits, no thyromegaly LYMPHATICS:  No cervical, inguinal adenopathy LUNGS:  Clear to auscultation bilaterally BACK:  No CVA tenderness CHEST:  Unremarkable HEART:  PMI not displaced or sustained,S1 and S2 within normal limits, no S3, no S4, no clicks,  no rubs, no murmurs ABD:  Flat, positive bowel sounds normal in frequency in pitch, no bruits, no rebound, no guarding, no midline pulsatile mass, no hepatomegaly, no splenomegaly EXT:  2 plus pulses throughout, no edema, no cyanosis no clubbing SKIN:  No rashes no nodules NEURO:  Cranial nerves II through XII grossly intact, motor grossly intact throughout PSYCH:  Cognitively intact, oriented to person place and time   EKG:  Sinus rhythm, rate 78, axis within normal limits, intervals within normal limits, nonspecific T-wave flattening  ASSESSMENT AND PLAN

## 2010-06-25 NOTE — Assessment & Plan Note (Signed)
We had a long discussion about the need to quit smoking completely.

## 2010-06-25 NOTE — Assessment & Plan Note (Signed)
I will have him come back for fasting lipid profile with a goal LDL less than 70 and HDL greater than 40.

## 2010-06-30 ENCOUNTER — Other Ambulatory Visit: Payer: Medicaid Other | Admitting: *Deleted

## 2010-07-01 ENCOUNTER — Other Ambulatory Visit: Payer: Medicaid Other | Admitting: *Deleted

## 2015-03-19 ENCOUNTER — Encounter (HOSPITAL_COMMUNITY): Payer: Self-pay | Admitting: *Deleted

## 2015-03-19 ENCOUNTER — Emergency Department (HOSPITAL_COMMUNITY)
Admission: EM | Admit: 2015-03-19 | Discharge: 2015-03-19 | Disposition: A | Discharge status: Home / Self Care | Payer: Self-pay | Source: Home / Self Care | Attending: Emergency Medicine | Admitting: Emergency Medicine

## 2015-03-19 DIAGNOSIS — J111 Influenza due to unidentified influenza virus with other respiratory manifestations: Secondary | ICD-10-CM

## 2015-03-19 MED ORDER — OSELTAMIVIR PHOSPHATE 75 MG PO CAPS: 75.0000 mg | ORAL_CAPSULE | Freq: Two times a day (BID) | ORAL | Status: DC

## 2015-03-19 NOTE — ED Notes (Signed)
Pt     Reports  Body  Aches   Chills -      Nausea     As  Well  As  Symptoms        Of         Congestion         X  sev  Days             Pt  Reports      Symptoms  Not  releived     By  otc  meds           Pt   Sitting  Upright  On      Exam table       Speaking  In  Complete   sentances

## 2015-03-19 NOTE — ED Provider Notes (Signed)
CSN: [id number]     Arrival date & time 03/19/15  1501 History   First MD Initiated Contact with Patient 03/19/15 1652     No chief complaint on file.  (Consider location/radiation/quality/duration/timing/severity/associated sxs/prior Treatment) The history is provided by the patient.    Past Medical History  Diagnosis Date  . CAD (coronary artery disease)     ACUTE,INFERIOR MI SECONDARY TO RCA OCCLUSION. NO SIGNIFICANT CAD IN THE LEFT MAIN,LAD, OR LEFT CIRCUMFLEX. MILD SEGMENTAL LV DYSFUNCTION WITH OVERALL PRESERVED LVEF. SUCCESSFUL PCI USING A SINGLE BARE-METAL STENT.  Marland Kitchen Asthma    Past Surgical History  Procedure Laterality Date  . Other surgical history      NONE   Family History  Problem Relation Age of Onset  . Other Sister 37    THE PATIENT REPORTS A SISTER WITH A MYOCARDIAL INFARCTION IN HER 30s.(SHE IS NOW S/P CABG)   Social History  Substance Use Topics  . Smoking status: Current Every Day Smoker -- 0.30 packs/day for 17 years    Types: Cigarettes  . Smokeless tobacco: Not on file     Comment: HE SMOKE LESS THAN  1 PACK PER DAY.  Marland Kitchen Alcohol Use: Not on file    Review of Systems  Constitutional: Positive for fever and chills.  HENT: Negative.   Eyes: Negative.   Respiratory: Negative.   Cardiovascular: Negative.   Gastrointestinal: Negative.   Endocrine: Negative.   Genitourinary: Negative.   Musculoskeletal: Negative.   Skin: Negative.   Allergic/Immunologic: Negative.   Neurological: Negative.   Hematological: Negative.   Psychiatric/Behavioral: Negative.     Allergies  Review of patient's allergies indicates no known allergies.  Home Medications   Prior to Admission medications   Medication Sig Start Date End Date Taking? Authorizing Provider  aspirin 325 MG tablet Take 325 mg by mouth daily.      Historical Provider, MD  atorvastatin (LIPITOR) 80 MG tablet Take 80 mg by mouth daily.      Historical Provider, MD  clopidogrel (PLAVIX) 75 MG tablet  Take 75 mg by mouth daily.      Historical Provider, MD  metoprolol tartrate (LOPRESSOR) 25 MG tablet 1 TAB BY MOUTH DAILY     Historical Provider, MD   Meds Ordered and Administered this Visit  Medications - No data to display  BP 124/85 mmHg  Pulse 101  Temp(Src) 100.4 F (38 C) (Oral)  Resp 16  SpO2 97% No data found.   Physical Exam  Constitutional:  Ill appearing  HENT:  Head: Normocephalic and atraumatic.  Eyes: Conjunctivae and EOM are normal. Pupils are equal, round, and reactive to light.  Neck: Normal range of motion.  Cardiovascular: Normal rate, regular rhythm and normal heart sounds.   Pulmonary/Chest: Effort normal and breath sounds normal.  Abdominal: Soft. Bowel sounds are normal.    ED Course  Procedures (including critical care time)  Labs Review Labs Reviewed - No data to display  Imaging Review No results found.   Visual Acuity Review  Right Eye Distance:   Left Eye Distance:   Bilateral Distance:    Right Eye Near:   Left Eye Near:    Bilateral Near:         MDM  Influenza Tamilflu  one po bid x 5 days #10  Push po fluids, rest, tylenol and motrin otc prn as directed for fever, arthralgias, and myalgias.  Follow up prn if sx's continue or persist.     Deatra Canter,  FNP 03/19/15 1743

## 2015-03-19 NOTE — Discharge Instructions (Signed)

## 2015-10-20 ENCOUNTER — Encounter (HOSPITAL_COMMUNITY)
Admission: EM | Disposition: A | Discharge status: Home / Self Care | Payer: Self-pay | Source: Home / Self Care | Attending: Cardiovascular Disease

## 2015-10-20 ENCOUNTER — Encounter (HOSPITAL_COMMUNITY): Payer: Self-pay | Admitting: Emergency Medicine

## 2015-10-20 ENCOUNTER — Inpatient Hospital Stay (HOSPITAL_COMMUNITY)
Admission: EM | Admit: 2015-10-20 | Discharge: 2015-10-22 | DRG: 247 | Disposition: A | Discharge status: Home / Self Care | Payer: Medicaid Other | Attending: Cardiovascular Disease | Admitting: Cardiovascular Disease

## 2015-10-20 DIAGNOSIS — Z7982 Long term (current) use of aspirin: Secondary | ICD-10-CM | POA: Diagnosis not present

## 2015-10-20 DIAGNOSIS — Z7902 Long term (current) use of antithrombotics/antiplatelets: Secondary | ICD-10-CM | POA: Diagnosis not present

## 2015-10-20 DIAGNOSIS — F17210 Nicotine dependence, cigarettes, uncomplicated: Secondary | ICD-10-CM | POA: Diagnosis present

## 2015-10-20 DIAGNOSIS — Z9114 Patient's other noncompliance with medication regimen: Secondary | ICD-10-CM

## 2015-10-20 DIAGNOSIS — I2129 ST elevation (STEMI) myocardial infarction involving other sites: Secondary | ICD-10-CM

## 2015-10-20 DIAGNOSIS — E7800 Pure hypercholesterolemia, unspecified: Secondary | ICD-10-CM | POA: Diagnosis present

## 2015-10-20 DIAGNOSIS — Z8249 Family history of ischemic heart disease and other diseases of the circulatory system: Secondary | ICD-10-CM

## 2015-10-20 DIAGNOSIS — I255 Ischemic cardiomyopathy: Secondary | ICD-10-CM | POA: Diagnosis present

## 2015-10-20 DIAGNOSIS — Z955 Presence of coronary angioplasty implant and graft: Secondary | ICD-10-CM | POA: Diagnosis not present

## 2015-10-20 DIAGNOSIS — I2510 Atherosclerotic heart disease of native coronary artery without angina pectoris: Secondary | ICD-10-CM | POA: Diagnosis present

## 2015-10-20 DIAGNOSIS — Z91013 Allergy to seafood: Secondary | ICD-10-CM

## 2015-10-20 DIAGNOSIS — I252 Old myocardial infarction: Secondary | ICD-10-CM

## 2015-10-20 DIAGNOSIS — E785 Hyperlipidemia, unspecified: Secondary | ICD-10-CM | POA: Diagnosis present

## 2015-10-20 DIAGNOSIS — I2119 ST elevation (STEMI) myocardial infarction involving other coronary artery of inferior wall: Principal | ICD-10-CM | POA: Diagnosis present

## 2015-10-20 DIAGNOSIS — I213 ST elevation (STEMI) myocardial infarction of unspecified site: Secondary | ICD-10-CM

## 2015-10-20 DIAGNOSIS — I2111 ST elevation (STEMI) myocardial infarction involving right coronary artery: Secondary | ICD-10-CM

## 2015-10-20 HISTORY — PX: CARDIAC CATHETERIZATION: SHX172

## 2015-10-20 HISTORY — DX: Pure hypercholesterolemia, unspecified: E78.00

## 2015-10-20 HISTORY — DX: ST elevation (STEMI) myocardial infarction involving other sites: I21.29

## 2015-10-20 HISTORY — DX: Tobacco use: Z72.0

## 2015-10-20 LAB — DIFFERENTIAL
BASOS ABS: 0 10*3/uL (ref 0.0–0.1)
BASOS PCT: 0 %
EOS ABS: 0 10*3/uL (ref 0.0–0.7)
Eosinophils Relative: 0 %
Lymphocytes Relative: 13 %
Lymphs Abs: 1.7 10*3/uL (ref 0.7–4.0)
MONOS PCT: 4 %
Monocytes Absolute: 0.6 10*3/uL (ref 0.1–1.0)
NEUTROS PCT: 83 %
Neutro Abs: 10.9 10*3/uL — ABNORMAL HIGH (ref 1.7–7.7)

## 2015-10-20 LAB — LIPID PANEL
CHOL/HDL RATIO: 8 ratio
CHOLESTEROL: 224 mg/dL — AB (ref 0–200)
HDL: 28 mg/dL — ABNORMAL LOW (ref >40)
LDL Cholesterol: 141 mg/dL — ABNORMAL HIGH (ref 0–99)
Triglycerides: 273 mg/dL — ABNORMAL HIGH (ref <150)
VLDL: 55 mg/dL — ABNORMAL HIGH (ref 0–40)

## 2015-10-20 LAB — CBC
HEMATOCRIT: 44.5 % (ref 39.0–52.0)
HEMOGLOBIN: 14.6 g/dL (ref 13.0–17.0)
MCH: 31.5 pg (ref 26.0–34.0)
MCHC: 32.8 g/dL (ref 30.0–36.0)
MCV: 95.9 fL (ref 78.0–100.0)
Platelets: 301 10*3/uL (ref 150–400)
RBC: 4.64 MIL/uL (ref 4.22–5.81)
RDW: 12.2 % (ref 11.5–15.5)
WBC: 13.1 10*3/uL — AB (ref 4.0–10.5)

## 2015-10-20 LAB — COMPREHENSIVE METABOLIC PANEL
ALT: 32 U/L (ref 17–63)
AST: 52 U/L — ABNORMAL HIGH (ref 15–41)
Albumin: 4.5 g/dL (ref 3.5–5.0)
Alkaline Phosphatase: 90 U/L (ref 38–126)
Anion gap: 12 (ref 5–15)
BILIRUBIN TOTAL: 0.6 mg/dL (ref 0.3–1.2)
BUN: 16 mg/dL (ref 6–20)
CHLORIDE: 107 mmol/L (ref 101–111)
CO2: 18 mmol/L — ABNORMAL LOW (ref 22–32)
CREATININE: 1.25 mg/dL — AB (ref 0.61–1.24)
Calcium: 10 mg/dL (ref 8.9–10.3)
Glucose, Bld: 141 mg/dL — ABNORMAL HIGH (ref 65–99)
Potassium: 4.3 mmol/L (ref 3.5–5.1)
Sodium: 137 mmol/L (ref 135–145)
TOTAL PROTEIN: 7.4 g/dL (ref 6.5–8.1)

## 2015-10-20 LAB — TROPONIN I
TROPONIN I: 0.9 ng/mL — AB (ref <0.03)
Troponin I: 22.03 ng/mL (ref <0.03)

## 2015-10-20 LAB — PROTIME-INR
INR: 0.99
Prothrombin Time: 13.1 seconds (ref 11.4–15.2)

## 2015-10-20 LAB — APTT: APTT: 26 s (ref 24–36)

## 2015-10-20 LAB — MRSA PCR SCREENING: MRSA BY PCR: NEGATIVE

## 2015-10-20 SURGERY — LEFT HEART CATH AND CORONARY ANGIOGRAPHY
Anesthesia: LOCAL

## 2015-10-20 MED ORDER — MIDAZOLAM HCL 2 MG/2ML IJ SOLN
INTRAMUSCULAR | Status: AC
  Filled 2015-10-20: qty 2

## 2015-10-20 MED ORDER — ONDANSETRON HCL 4 MG/2ML IJ SOLN
4.0000 mg | Freq: Once | INTRAMUSCULAR | Status: AC
  Administered 2015-10-20: 4 mg via INTRAVENOUS
  Filled 2015-10-20: qty 2

## 2015-10-20 MED ORDER — NITROGLYCERIN 1 MG/10 ML FOR IR/CATH LAB
INTRA_ARTERIAL | Status: DC | PRN
  Administered 2015-10-20 (×2): 200 ug via INTRACORONARY

## 2015-10-20 MED ORDER — TICAGRELOR 90 MG PO TABS
ORAL_TABLET | ORAL | Status: AC
  Filled 2015-10-20: qty 1

## 2015-10-20 MED ORDER — METOPROLOL TARTRATE 25 MG PO TABS
37.5000 mg | ORAL_TABLET | Freq: Two times a day (BID) | ORAL | Status: DC
  Administered 2015-10-20 – 2015-10-22 (×4): 37.5 mg via ORAL
  Filled 2015-10-20 (×4): qty 1

## 2015-10-20 MED ORDER — VERAPAMIL HCL 2.5 MG/ML IV SOLN
INTRAVENOUS | Status: DC | PRN
  Administered 2015-10-20: 10 mL via INTRA_ARTERIAL

## 2015-10-20 MED ORDER — TICAGRELOR 90 MG PO TABS
90.0000 mg | ORAL_TABLET | Freq: Two times a day (BID) | ORAL | Status: DC
  Administered 2015-10-20 – 2015-10-22 (×4): 90 mg via ORAL
  Filled 2015-10-20 (×4): qty 1

## 2015-10-20 MED ORDER — SODIUM CHLORIDE 0.9% FLUSH
3.0000 mL | Freq: Two times a day (BID) | INTRAVENOUS | Status: DC
  Administered 2015-10-20 – 2015-10-21 (×3): 3 mL via INTRAVENOUS

## 2015-10-20 MED ORDER — IOPAMIDOL (ISOVUE-370) INJECTION 76%
INTRAVENOUS | Status: AC
  Filled 2015-10-20: qty 125

## 2015-10-20 MED ORDER — TICAGRELOR 90 MG PO TABS
ORAL_TABLET | ORAL | Status: DC | PRN
  Administered 2015-10-20: 180 mg via ORAL

## 2015-10-20 MED ORDER — HEPARIN (PORCINE) IN NACL 2-0.9 UNIT/ML-% IJ SOLN
INTRAMUSCULAR | Status: DC | PRN
  Administered 2015-10-20: 1000 mL

## 2015-10-20 MED ORDER — BIVALIRUDIN 250 MG IV SOLR
INTRAVENOUS | Status: AC
  Filled 2015-10-20: qty 250

## 2015-10-20 MED ORDER — ASPIRIN 81 MG PO CHEW
81.0000 mg | CHEWABLE_TABLET | Freq: Every day | ORAL | Status: DC
  Administered 2015-10-21 – 2015-10-22 (×2): 81 mg via ORAL
  Filled 2015-10-20 (×2): qty 1

## 2015-10-20 MED ORDER — FENTANYL CITRATE (PF) 100 MCG/2ML IJ SOLN
INTRAMUSCULAR | Status: AC
  Filled 2015-10-20: qty 2

## 2015-10-20 MED ORDER — ACETAMINOPHEN 325 MG PO TABS: 650.0000 mg | ORAL_TABLET | ORAL | Status: DC | PRN

## 2015-10-20 MED ORDER — SODIUM CHLORIDE 0.9% FLUSH: 3.0000 mL | INTRAVENOUS | Status: DC | PRN

## 2015-10-20 MED ORDER — ONDANSETRON HCL 4 MG/2ML IJ SOLN: 4.0000 mg | Freq: Four times a day (QID) | INTRAMUSCULAR | Status: DC | PRN

## 2015-10-20 MED ORDER — BIVALIRUDIN BOLUS VIA INFUSION - CUPID
INTRAVENOUS | Status: DC | PRN
  Administered 2015-10-20: 81.675 mg via INTRAVENOUS

## 2015-10-20 MED ORDER — FENTANYL CITRATE (PF) 100 MCG/2ML IJ SOLN
INTRAMUSCULAR | Status: DC | PRN
  Administered 2015-10-20: 50 ug via INTRAVENOUS

## 2015-10-20 MED ORDER — MORPHINE SULFATE (PF) 2 MG/ML IV SOLN: 2.0000 mg | INTRAVENOUS | Status: DC | PRN

## 2015-10-20 MED ORDER — MIDAZOLAM HCL 2 MG/2ML IJ SOLN
INTRAMUSCULAR | Status: DC | PRN
  Administered 2015-10-20: 2 mg via INTRAVENOUS

## 2015-10-20 MED ORDER — SODIUM CHLORIDE 0.9 % IV SOLN: INTRAVENOUS | Status: AC

## 2015-10-20 MED ORDER — NITROGLYCERIN 1 MG/10 ML FOR IR/CATH LAB
INTRA_ARTERIAL | Status: AC
  Filled 2015-10-20: qty 10

## 2015-10-20 MED ORDER — IOPAMIDOL (ISOVUE-370) INJECTION 76%
INTRAVENOUS | Status: AC
  Filled 2015-10-20: qty 100

## 2015-10-20 MED ORDER — HEPARIN SODIUM (PORCINE) 5000 UNIT/ML IJ SOLN
4000.0000 [IU] | INTRAMUSCULAR | Status: AC
  Administered 2015-10-20: 4000 [IU] via INTRAVENOUS
  Filled 2015-10-20: qty 1

## 2015-10-20 MED ORDER — ASPIRIN 81 MG PO CHEW
324.0000 mg | CHEWABLE_TABLET | Freq: Once | ORAL | Status: AC
  Administered 2015-10-20: 324 mg via ORAL
  Filled 2015-10-20: qty 4

## 2015-10-20 MED ORDER — VERAPAMIL HCL 2.5 MG/ML IV SOLN
INTRAVENOUS | Status: AC
  Filled 2015-10-20: qty 2

## 2015-10-20 MED ORDER — SODIUM CHLORIDE 0.9 % IV SOLN
INTRAVENOUS | Status: DC | PRN
  Administered 2015-10-20: 1.75 mg/kg/h via INTRAVENOUS

## 2015-10-20 MED ORDER — SODIUM CHLORIDE 0.9 % IV SOLN: 250.0000 mL | INTRAVENOUS | Status: DC | PRN

## 2015-10-20 MED ORDER — LIDOCAINE HCL (PF) 1 % IJ SOLN
INTRAMUSCULAR | Status: AC
  Filled 2015-10-20: qty 30

## 2015-10-20 MED ORDER — MORPHINE SULFATE (PF) 2 MG/ML IV SOLN
2.0000 mg | Freq: Once | INTRAVENOUS | Status: AC
  Administered 2015-10-20: 2 mg via INTRAVENOUS
  Filled 2015-10-20: qty 1

## 2015-10-20 MED ORDER — ATORVASTATIN CALCIUM 80 MG PO TABS
80.0000 mg | ORAL_TABLET | Freq: Every day | ORAL | Status: DC
  Administered 2015-10-20 – 2015-10-21 (×2): 80 mg via ORAL
  Filled 2015-10-20 (×2): qty 1

## 2015-10-20 MED ORDER — HEPARIN (PORCINE) IN NACL 2-0.9 UNIT/ML-% IJ SOLN
INTRAMUSCULAR | Status: AC
  Filled 2015-10-20: qty 1000

## 2015-10-20 MED ORDER — METOPROLOL TARTRATE 25 MG PO TABS: 25.0000 mg | ORAL_TABLET | Freq: Two times a day (BID) | ORAL | Status: DC

## 2015-10-20 MED ORDER — IOPAMIDOL (ISOVUE-370) INJECTION 76%
INTRAVENOUS | Status: DC | PRN
  Administered 2015-10-20: 195 mL via INTRA_ARTERIAL

## 2015-10-20 MED ORDER — SODIUM CHLORIDE 0.9 % IV SOLN
10.0000 mL/h | INTRAVENOUS | Status: DC
  Administered 2015-10-20: 20 mL/h via INTRAVENOUS

## 2015-10-20 SURGICAL SUPPLY — 20 items
BALLN EMERGE MR 2.0X15 (BALLOONS) ×2
BALLN EMERGE MR 2.5X12 (BALLOONS) ×2
BALLN ~~LOC~~ TREK RX 3.25X12 (BALLOONS) ×2
BALLOON EMERGE MR 2.0X15 (BALLOONS) ×1 IMPLANT
BALLOON EMERGE MR 2.5X12 (BALLOONS) ×1 IMPLANT
BALLOON ~~LOC~~ TREK RX 3.25X12 (BALLOONS) ×1 IMPLANT
CATH IMPULSE 5F ANG/FL3.5 (CATHETERS) ×2 IMPLANT
CATH VISTA GUIDE 6FR XBLAD3.5 (CATHETERS) ×2 IMPLANT
DEVICE RAD COMP TR BAND LRG (VASCULAR PRODUCTS) ×2 IMPLANT
GLIDESHEATH SLEND SS 6F .021 (SHEATH) ×2 IMPLANT
KIT ENCORE 26 ADVANTAGE (KITS) ×2 IMPLANT
KIT HEART LEFT (KITS) ×2 IMPLANT
PACK CARDIAC CATHETERIZATION (CUSTOM PROCEDURE TRAY) ×2 IMPLANT
STENT SYNERGY DES 3X16 (Permanent Stent) ×2 IMPLANT
SYR MEDRAD MARK V 150ML (SYRINGE) ×2 IMPLANT
TRANSDUCER W/STOPCOCK (MISCELLANEOUS) ×2 IMPLANT
TUBING CIL FLEX 10 FLL-RA (TUBING) ×2 IMPLANT
WIRE COUGAR XT STRL 190CM (WIRE) ×2 IMPLANT
WIRE HI TORQ WHISPER MS 190CM (WIRE) ×2 IMPLANT
WIRE SAFE-T 1.5MM-J .035X260CM (WIRE) ×2 IMPLANT

## 2015-10-20 NOTE — ED Notes (Signed)
Cardiologist MD at bedside

## 2015-10-20 NOTE — H&P (Addendum)
Patient ID: Joshua May MRN: [id number] DOB/AGE: 1975/12/04 40 y.o. Admit date: 10/20/2015  Primary Care Physician: No PCP Per Patient Primary Cardiologist: Hochrein  HPI: 40 yo male with history of HLD, tobacco abuse and CAD with prior MI and bare metal stent placement RCA in 2010 who presented to the [facility] ED today with c/o severe chest pain, nausea, dyspnea. Prior MI in 2009 in [place] but no stent placed per pt. Inferior MI in March 2010 with placement of a bare metal stent in the RCA. He has not seen Dr. Antoine Poche since 2012. He has been off of all medical therapy except ASA. He is still smoking. Chest pain began around 3 am this am. Similar pain 2 days ago but did not seek medical attention. Pain is similar to pain he had with prior MI. EKG today with inferior Q-waves, 1 mm ST segment elevation. Code STEMI activated. Ongoing chest pain. Emergent cardiac cath planned. Pt received 4000 units IV heparin, morphine, Zofran and ASA in the ED.   Review of systems complete and found to be negative unless listed above   Past Medical History:  Diagnosis Date  . Asthma   . 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 NO SIGNIFICANT CAD IN THE LEFT MAIN,LAD, OR LEFT CIRCUMFLEX. MILD SEGMENTAL LV DYSFUNCTION WITH OVERALL PRESERVED LVEF. SUCCESSFUL PCI USING A SINGLE BARE-METAL STENT.  Marland Kitchen Hypercholesteremia   . Tobacco abuse     Family History  Problem Relation Age of Onset  . Other Sister 28    THE PATIENT REPORTS A SISTER WITH A MYOCARDIAL INFARCTION IN HER 30s.(SHE IS NOW S/P CABG)  . Heart attack Father     Social History   Social History  . Marital status: Married    Spouse name: N/A  . Number of children: N/A  . Years of education: N/A   Occupational History  . Not on file.   Social History Main Topics  . Smoking status: Current Every Day Smoker    Packs/day: 0.50    Years: 17.00    Types: Cigarettes  . Smokeless tobacco: Never Used       Comment: HE SMOKE LESS THAN  1 PACK PER DAY.  Marland Kitchen Alcohol use 4.8 oz/week    8 Shots of liquor per week  . Drug use:     Types: Marijuana  . Sexual activity: Not on file   Other Topics Concern  . Not on file   Social History Narrative   The patient is married with two children.    Past Surgical History:  Procedure Laterality Date  . none    . OTHER SURGICAL HISTORY     NONE    Allergies  Allergen Reactions  . Shellfish Allergy Anaphylaxis    Prior to Admission Meds:  Prior to Admission medications   Medication Sig Start Date End Date Taking? Authorizing Provider  aspirin 325 MG tablet Take 325 mg by mouth daily.      Historical Provider, MD                                Physical Exam: Blood pressure 123/60, pulse 78, temperature 98.2 F (36.8 C), temperature source Oral, resp. rate 20, height 5\' 11"  (1.803 m), weight 240 lb (108.9 kg), SpO2 100 %.    General: Well developed, well nourished, appears uncomfortable, alert and oriented x 3.  HEENT:  OP clear, mucus membranes moist  SKIN: warm, dry. No rashes.  Neuro: No focal deficits  Musculoskeletal: Muscle strength 5/5 all ext  Psychiatric: Mood and affect normal  Neck: No JVD, no carotid bruits, no thyromegaly, no lymphadenopathy.  Lungs:Clear bilaterally, no wheezes, rhonci, crackles  Cardiovascular: Regular rate and rhythm. No murmurs, gallops or rubs.  Abdomen:Soft. Bowel sounds present. Non-tender.  Extremities: No lower extremity edema. Pulses are 2 + in the bilateral DP/PT.   Labs:   Lab Results  Component Value Date   WBC 13.1 (H) 10/20/2015   HGB 14.6 10/20/2015   HCT 44.5 10/20/2015   MCV 95.9 10/20/2015   PLT 301 10/20/2015    BMET pending   EKG: sinus, 1 mm ST segment elevation inferior leads.   ASSESSMENT AND PLAN:   1. Acute inferior STEMI: Pt with prior placement bare metal stent in RCA in 2010. Ongoing tobacco abuse with medication non-compliance and no cardiac follow up in since  2012. Plan emergent cardiac cath today with possible PCI.   Earney Hamburghris McAlhany, MD 10/20/2015, 12:17 PM

## 2015-10-20 NOTE — ED Notes (Signed)
Chaplain responding to code STEMI.   Provided brief crisis support with family at bedside in ED and 2 heart waiting.    Spouse and two children present.

## 2015-10-20 NOTE — ED Notes (Signed)
MD at bedside. 

## 2015-10-20 NOTE — ED Triage Notes (Addendum)
Pt arrives from home reporting new CP beginning around 3am.  Pt reports medial pain radiating to RUE and back.  Pt reports N/V, SOB, diaphoresis, denies LOC.  Pt reports hx MI, states similar pain.

## 2015-10-20 NOTE — ED Provider Notes (Signed)
[facility]-EMERGENCY DEPT Provider Note   CSN: [id number] Arrival date & time: 10/20/15  1116     History   Chief Complaint Chief Complaint  Patient presents with  . Code STEMI    HPI Joshua May is a 40 y.o. male.  HPI  Patient has history of coronary artery disease and an acute inferior MI secondary to an RCA occlusion.  This was back in December 2010.  Patient states he has been doing well until last evening at about 3 AM when he had acute onset of chest pain in the center of his chest radiating to both sides and towards his left shoulder. She has felt nauseated and short of breath. He took aspirin this morning. Because of the persistent symptoms he decided to come into the emergency room. The pain is now severe. He feels that the symptoms are similar to his prior MI. He denies any fevers or chills. No cough. No recent injuries. No leg swelling. Past Medical History:  Diagnosis Date  . Asthma   . 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 NO SIGNIFICANT CAD IN THE LEFT MAIN,LAD, OR LEFT CIRCUMFLEX. MILD SEGMENTAL LV DYSFUNCTION WITH OVERALL PRESERVED LVEF. SUCCESSFUL PCI USING A SINGLE BARE-METAL STENT.  Marland Kitchen Hypercholesteremia     Patient Active Problem List   Diagnosis Date Noted  . DYSPHAGIA UNSPECIFIED 09/04/2008  . DYSLIPIDEMIA 04/27/2008  . SLEEP APNEA 04/27/2008  . TOBACCO ABUSE 04/26/2008  . CAD 04/26/2008  . ASTHMA 04/26/2008    Past Surgical History:  Procedure Laterality Date  . OTHER SURGICAL HISTORY     NONE       Home Medications    Prior to Admission medications   Medication Sig Start Date End Date Taking? Authorizing Provider  aspirin 325 MG tablet Take 325 mg by mouth daily.      Historical Provider, MD  atorvastatin (LIPITOR) 80 MG tablet Take 80 mg by mouth daily.      Historical Provider, MD  clopidogrel (PLAVIX) 75 MG tablet Take 75 mg by mouth daily.      Historical Provider, MD  metoprolol tartrate  (LOPRESSOR) 25 MG tablet 1 TAB BY MOUTH DAILY     Historical Provider, MD  oseltamivir (TAMIFLU) 75 MG capsule Take 1 capsule (75 mg total) by mouth every 12 (twelve) hours. 03/19/15   Deatra Canter, FNP    Family History Family History  Problem Relation Age of Onset  . Other Sister 2    THE PATIENT REPORTS A SISTER WITH A MYOCARDIAL INFARCTION IN HER 30s.(SHE IS NOW S/P CABG)    Social History Social History  Substance Use Topics  . Smoking status: Current Every Day Smoker    Packs/day: 0.50    Years: 17.00    Types: Cigarettes  . Smokeless tobacco: Never Used     Comment: HE SMOKE LESS THAN  1 PACK PER DAY.  Marland Kitchen Alcohol use 4.8 oz/week    8 Shots of liquor per week     Allergies   Shellfish allergy   Review of Systems Review of Systems  All other systems reviewed and are negative.    Physical Exam Updated Vital Signs BP 141/91 (BP Location: Right Arm)   Pulse 70   Temp 98.2 F (36.8 C) (Oral)   Resp 15   Ht 5\' 11"  (1.803 m)   Wt 108.9 kg   SpO2 100%   BMI 33.47 kg/m   Physical Exam  Constitutional: He appears  distressed.  HENT:  Head: Normocephalic and atraumatic.  Right Ear: External ear normal.  Left Ear: External ear normal.  Eyes: Conjunctivae are normal. Right eye exhibits no discharge. Left eye exhibits no discharge. No scleral icterus.  Neck: Neck supple. No tracheal deviation present.  Cardiovascular: Normal rate, regular rhythm and intact distal pulses.   Pulmonary/Chest: Effort normal and breath sounds normal. No stridor. No respiratory distress. He has no wheezes. He has no rales.  Abdominal: Soft. Bowel sounds are normal. He exhibits no distension. There is no tenderness. There is no rebound and no guarding.  Musculoskeletal: He exhibits no edema or tenderness.  Neurological: He is alert. He has normal strength. No cranial nerve deficit (no facial droop, extraocular movements intact, no slurred speech) or sensory deficit. He exhibits normal  muscle tone. He displays no seizure activity. Coordination normal.  Skin: Skin is warm and dry. No rash noted. He is not diaphoretic.  Psychiatric: He has a normal mood and affect.  Nursing note and vitals reviewed.    ED Treatments / Results  Labs (all labs ordered are listed, but only abnormal results are displayed) Labs Reviewed  CBC  DIFFERENTIAL  PROTIME-INR  APTT  COMPREHENSIVE METABOLIC PANEL  TROPONIN I  LIPID PANEL    EKG  EKG Interpretation  Date/Time:  Sunday October 20 2015 11:19:59 EDT Ventricular Rate:  72 PR Interval:  126 QRS Duration: 86 QT Interval:  404 QTC Calculation: 442 R Axis:   14 Text Interpretation:  Normal sinus rhythm Inferior infarct , age undetermined Anterolateral infarct , age undetermined Abnormal ECG new t wave inversion, increasing st elevation inferior leads which is present on prior ECG Confirmed by Leita Lindbloom  MD-J, Dagon Budai ([id number]) on 10/20/2015 11:27:11 AM      Procedures .Critical Care Performed by: Linwood Dibbles Authorized by: Linwood Dibbles   Critical care provider statement:    Critical care time (minutes):  20   Critical care was necessary to treat or prevent imminent or life-threatening deterioration of the following conditions: MI.   Critical care was time spent personally by me on the following activities:  Discussions with consultants, evaluation of patient's response to treatment, examination of patient, ordering and performing treatments and interventions, ordering and review of laboratory studies, ordering and review of radiographic studies, pulse oximetry, re-evaluation of patient's condition, obtaining history from patient or surrogate and review of old charts   (including critical care time)  Medications Ordered in ED Medications  0.9 %  sodium chloride infusion (not administered)  aspirin chewable tablet 324 mg (not administered)  heparin injection 4,000 Units (not administered)     Initial Impression / Assessment and Plan /  ED Course  I have reviewed the triage vital signs and the nursing notes.  Pertinent labs & imaging results that were available during my care of the patient were reviewed by me and considered in my medical decision making (see chart for details).  Clinical Course  Comment By Time  EKG reviewed at 1125.  ST elevation on prior ECG but does appear worse. Cardiology consulted.  Discussed with Dr Clifton James.  Code stemi activated Linwood Dibbles, MD 10/01 1144    The patient presented to the emergency room with symptoms concerning for acute coronary syndrome. He appears to have chronic ST elevation but the EKG findings today show worsening ST elevation with T-wave inversion.  Code STEMI has been activated. Aspirin and heparin have been ordered. The plan is for emergent cardiac catheterization.  Final Clinical Impressions(s) /  ED Diagnoses   Final diagnoses:  ST elevation myocardial infarction (STEMI), unspecified artery (HCC)      Linwood DibblesJon Deran Barro, MD 10/20/15 1152

## 2015-10-20 NOTE — ED Notes (Signed)
activated code stemi with [facility]- spoke with rhonda.- per a verbal from Dr. Lynelle DoctorKnapp.

## 2015-10-20 NOTE — Progress Notes (Signed)
Dr. Diona BrownerMcDowell informed of nonsustained VT and patients complaints of new onset throbbing CP 3/10. New orders received. Patient remains in NSR in 70s at this time. BP WNL.

## 2015-10-20 NOTE — ED Notes (Signed)
Pt placed on radiolucent zoll pads.

## 2015-10-21 ENCOUNTER — Inpatient Hospital Stay (HOSPITAL_COMMUNITY): Payer: Medicaid Other

## 2015-10-21 ENCOUNTER — Encounter (HOSPITAL_COMMUNITY): Payer: Self-pay | Admitting: Cardiovascular Disease

## 2015-10-21 DIAGNOSIS — I2119 ST elevation (STEMI) myocardial infarction involving other coronary artery of inferior wall: Principal | ICD-10-CM

## 2015-10-21 DIAGNOSIS — I213 ST elevation (STEMI) myocardial infarction of unspecified site: Secondary | ICD-10-CM

## 2015-10-21 LAB — ECHOCARDIOGRAM COMPLETE
HEIGHTINCHES: 71 in
WEIGHTICAEL: 3781.33 [oz_av]

## 2015-10-21 LAB — TROPONIN I: Troponin I: >65 ng/mL (ref <0.03)

## 2015-10-21 LAB — BASIC METABOLIC PANEL
Anion gap: 7 (ref 5–15)
BUN: 9 mg/dL (ref 6–20)
CALCIUM: 9.2 mg/dL (ref 8.9–10.3)
CHLORIDE: 105 mmol/L (ref 101–111)
CO2: 25 mmol/L (ref 22–32)
CREATININE: 1.06 mg/dL (ref 0.61–1.24)
Glucose, Bld: 111 mg/dL — ABNORMAL HIGH (ref 65–99)
Potassium: 3.9 mmol/L (ref 3.5–5.1)
SODIUM: 137 mmol/L (ref 135–145)

## 2015-10-21 LAB — CBC
HCT: 43.9 % (ref 39.0–52.0)
HEMOGLOBIN: 14 g/dL (ref 13.0–17.0)
MCH: 30.4 pg (ref 26.0–34.0)
MCHC: 31.9 g/dL (ref 30.0–36.0)
MCV: 95.4 fL (ref 78.0–100.0)
PLATELETS: 302 10*3/uL (ref 150–400)
RBC: 4.6 MIL/uL (ref 4.22–5.81)
RDW: 12.1 % (ref 11.5–15.5)
WBC: 15 10*3/uL — ABNORMAL HIGH (ref 4.0–10.5)

## 2015-10-21 LAB — POCT ACTIVATED CLOTTING TIME: ACTIVATED CLOTTING TIME: 621 s

## 2015-10-21 MED FILL — Nitroglycerin IV Soln 100 MCG/ML in D5W: INTRA_ARTERIAL | Qty: 10 | Status: CN

## 2015-10-21 MED FILL — Lidocaine HCl Local Preservative Free (PF) Inj 1%: INTRAMUSCULAR | Qty: 30 | Status: AC

## 2015-10-21 MED FILL — Bivalirudin For IV Soln 250 MG: INTRAVENOUS | Qty: 250 | Status: AC

## 2015-10-21 NOTE — Care Management Note (Signed)
Case Management Note  Patient Details  Name: Ambrose MantleKaleff H Abood MRN: [id number] Date of Birth: [birth year]  Subjective/Objective:    Adm w mi                Action/Plan:lives at home w fam   Expected Discharge Date:                  Expected Discharge Plan:  Home/Self Care  In-House Referral:     Discharge planning Services  CM Consult, Indigent Health Clinic, Medication Assistance  Post Acute Care Choice:    Choice offered to:     DME Arranged:    DME Agency:     HH Arranged:    HH Agency:     Status of Service:  In process, will continue to follow  If discussed at Long Length of Stay Meetings, dates discussed:    Additional Comments:no pcp , gave pt [facility] lists. Gave pt 30day free brilinta card and end pt to call 800 number to see if he can get few more month. Pt assist form on shadow chart.  Hanley Haysowell, Zykia Walla T, RN 10/21/2015, 9:51 AM

## 2015-10-21 NOTE — Progress Notes (Signed)
  Echocardiogram 2D Echocardiogram has been performed.  Arvil ChacoFoster, Afra Tricarico 10/21/2015, 11:54 AM

## 2015-10-21 NOTE — Progress Notes (Signed)
CARDIAC REHAB PHASE I   PRE:  Rate/Rhythm: 81 SR c/ PVCs  BP:  Sitting: 109/68        SaO2: 98 RA  MODE:  Ambulation: 350 ft   POST:  Rate/Rhythm: 105 ST  BP:  Sitting: 116/73         SaO2: 100 RA  Pt ambulated 350 ft on RA, handheld assist, steady gait, tolerated well with no complaints. Completed MI/stent education.  Reviewed risk factors, tobacco cessation (gave pt fake cigarette), anti-platelet therapy, stent card, activity restrictions, ntg, exercise, heart healthy diet and phase 2 cardiac rehab. Pt verbalized understanding. Pt agrees to phase 2 cardiac rehab referral, [facility]. Pt does not have insurance, on brilinta, also does not have a PCP, will need to see case manager regarding medications, establish PCP prior to d/c. Pt to recliner after walk, call bell within reach. Will follow.    [phone] Joylene GrapesEmily C Wrenley Sayed, RN, BSN 10/21/2015 9:44 AM

## 2015-10-21 NOTE — Progress Notes (Signed)
    Subjective:  No chest pain or shortness of breath this am. Feeling ok. Has been off of meds because of cost/limited resources.   Objective:  Vital Signs in the last 24 hours: Temp:  [98.1 F (36.7 C)-98.8 F (37.1 C)] 98.8 F (37.1 C) (10/02 0830) Pulse Rate:  [0-235] 85 (10/02 0600) Resp:  [0-59] 25 (10/02 0600) BP: (111-152)/(50-100) 124/73 (10/02 0600) SpO2:  [0 %-100 %] 99 % (10/02 0600) Weight:  [236 lb 5.3 oz (107.2 kg)-240 lb (108.9 kg)] 236 lb 5.3 oz (107.2 kg) (10/01 1350)  Intake/Output from previous day: 10/01 0701 - 10/02 0700 In: 1260 [P.O.:960; I.V.:300] Out: 1700 [Urine:1700]  Physical Exam: Pt is alert and oriented, pleasant obese male in NAD HEENT: normal Neck: JVP - normal, carotids 2+= without bruits Lungs: CTA bilaterally CV: RRR without murmur or gallop Abd: soft, NT, Positive BS, no hepatomegaly Ext: no C/C/E, distal pulses intact and equal, right radial site is clear with a 2+ proximal pulse Skin: warm/dry no rash   Lab Results:  Recent Labs  10/20/15 1144 10/21/15 0219  WBC 13.1* 15.0*  HGB 14.6 14.0  PLT 301 302    Recent Labs  10/20/15 1144 10/21/15 0219  NA 137 137  K 4.3 3.9  CL 107 105  CO2 18* 25  GLUCOSE 141* 111*  BUN 16 9  CREATININE 1.25* 1.06    Recent Labs  10/20/15 1922 10/21/15 0219  TROPONINI >65.00* >65.00*    Cardiac Studies: 2D Echo pending  Tele: Sinus rhythm, Episode of nonsustained VT last night  EKG: Normal sinus rhythm, age indeterminate inferior MI, resolution of ST segment elevation compared to 10/20/15 study  Assessment/Plan:  1. Acute inferolateral STEMI: Patient with acutely occluded left circumflex, treated with PCI using a drug-eluting stent. Troponins are greater than 65. LVEF is preserved by cath assessment. 2-D echo pending today.  2. Tobacco abuse: Cessation counseling done.  3. Hyperlipidemia: Cholesterol is 224, LDL 141, HDL low at 28. High intensity statin drug  started.  Overall the patient appears stable. He has 24 hours out from his MI. Did have an episode of nonsustained VT overnight, but rhythm looks good this morning with normal sinus. Will continue aspirin, brilinta, metoprolol, and atorvastatin. It will be important to get him on affordable medications at discharge. Will consult the case manager. Will write transfer orders for him to go to a telemetry bed today. Possible discharge tomorrow. 2-D echocardiogram is pending.   Tonny Bollmanooper, Brinleigh Tew, M.D. 10/21/2015, 8:53 AM

## 2015-10-22 ENCOUNTER — Encounter (HOSPITAL_COMMUNITY): Payer: Self-pay | Admitting: Cardiology

## 2015-10-22 ENCOUNTER — Telehealth: Payer: Self-pay | Admitting: Cardiology

## 2015-10-22 DIAGNOSIS — Z720 Tobacco use: Secondary | ICD-10-CM

## 2015-10-22 DIAGNOSIS — I2510 Atherosclerotic heart disease of native coronary artery without angina pectoris: Secondary | ICD-10-CM | POA: Diagnosis present

## 2015-10-22 DIAGNOSIS — I255 Ischemic cardiomyopathy: Secondary | ICD-10-CM

## 2015-10-22 DIAGNOSIS — E7800 Pure hypercholesterolemia, unspecified: Secondary | ICD-10-CM

## 2015-10-22 MED ORDER — METOPROLOL TARTRATE 37.5 MG PO TABS: 37.5000 mg | ORAL_TABLET | Freq: Two times a day (BID) | ORAL | 12 refills | Status: DC

## 2015-10-22 MED ORDER — NITROGLYCERIN 0.4 MG SL SUBL: 0.4000 mg | SUBLINGUAL_TABLET | SUBLINGUAL | 2 refills | Status: DC | PRN

## 2015-10-22 MED ORDER — ASPIRIN EC 81 MG PO TBEC: 81.0000 mg | DELAYED_RELEASE_TABLET | Freq: Every day | ORAL | Status: DC

## 2015-10-22 MED ORDER — TICAGRELOR 90 MG PO TABS: 90.0000 mg | ORAL_TABLET | Freq: Two times a day (BID) | ORAL | 0 refills | Status: DC

## 2015-10-22 MED ORDER — LISINOPRIL 5 MG PO TABS: 5.0000 mg | ORAL_TABLET | Freq: Every day | ORAL | 12 refills | Status: DC

## 2015-10-22 MED ORDER — LISINOPRIL 5 MG PO TABS
5.0000 mg | ORAL_TABLET | Freq: Every day | ORAL | Status: DC
  Administered 2015-10-22: 5 mg via ORAL
  Filled 2015-10-22: qty 1

## 2015-10-22 MED ORDER — TICAGRELOR 90 MG PO TABS: 90.0000 mg | ORAL_TABLET | Freq: Two times a day (BID) | ORAL | 12 refills | Status: DC

## 2015-10-22 NOTE — Progress Notes (Signed)
SUBJECTIVE: no chest pain or dyspnea  Tele: sinus  BP 120/66 (BP Location: Left Arm)   Pulse 81   Temp 99.3 F (37.4 C) (Oral)   Resp (!) 23   Ht 5\' 11"  (1.803 m)   Wt 236 lb 5.3 oz (107.2 kg)   SpO2 96%   BMI 32.96 kg/m   Intake/Output Summary (Last 24 hours) at 10/22/15 [id number] Last data filed at 10/21/15 2200  Gross per 24 hour  Intake              920 ml  Output                0 ml  Net              920 ml    PHYSICAL EXAM General: Well developed, well nourished, in no acute distress. Alert and oriented x 3.  Psych:  Good affect, responds appropriately Neck: No JVD. No masses noted.  Lungs: Clear bilaterally with no wheezes or rhonci noted.  Heart: RRR with no murmurs noted. Abdomen: Bowel sounds are present. Soft, non-tender.  Extremities: No lower extremity edema.   LABS: Basic Metabolic Panel:  Recent Labs  96/04/5408/01/17 1144 10/21/15 0219  NA 137 137  K 4.3 3.9  CL 107 105  CO2 18* 25  GLUCOSE 141* 111*  BUN 16 9  CREATININE 1.25* 1.06  CALCIUM 10.0 9.2   CBC:  Recent Labs  10/20/15 1144 10/21/15 0219  WBC 13.1* 15.0*  NEUTROABS 10.9*  --   HGB 14.6 14.0  HCT 44.5 43.9  MCV 95.9 95.4  PLT 301 302   Cardiac Enzymes:  Recent Labs  10/20/15 1423 10/20/15 1922 10/21/15 0219  TROPONINI 22.03* >65.00* >65.00*   Fasting Lipid Panel:  Recent Labs  10/20/15 1144  CHOL 224*  HDL 28*  LDLCALC 141*  TRIG 273*  CHOLHDL 8.0    Current Meds: . aspirin  81 mg Oral Daily  . atorvastatin  80 mg Oral q1800  . metoprolol tartrate  37.5 mg Oral BID  . sodium chloride flush  3 mL Intravenous Q12H  . ticagrelor  90 mg Oral BID   Echo 10/22/15: Left ventricle: The cavity size was mildly dilated. Systolic   function was mildly to moderately reduced. The estimated ejection   fraction was in the range of 40% to 45%. Diffuse hypokinesis.   Features are consistent with a pseudonormal left ventricular   filling pattern, with concomitant abnormal  relaxation and   increased filling pressure (grade 2 diastolic dysfunction).   Doppler parameters are consistent with indeterminate ventricular   filling pressure. - Aortic valve: Transvalvular velocity was within the normal range.   There was no stenosis. There was no regurgitation. - Mitral valve: Transvalvular velocity was within the normal range.   There was no evidence for stenosis. There was mild regurgitation. - Right ventricle: The cavity size was normal. Wall thickness was   normal. Systolic function was normal. - Tricuspid valve: There was no regurgitation.  ASSESSMENT AND PLAN:  1. Acute inferolateral STEMI: Patient with acutely occluded left circumflex, treated with PCI using a drug-eluting stent. Troponins are greater than 65. Echo with LVEF=40-45%. Will continue DAPT with ASA and Brilinta. Continue beta blocker and statin. Will start Ace-inh.   2. Tobacco abuse: Cessation counseling done.  3. Hyperlipidemia: He is on a high intensity statin.    4. Ischemic cardiomyopathy: Will continue beta blocker and will start Lisinopril.   Discharge home today.  He will need f/u with an office APP in [place] office one week. Follow up Dr. Antoine Poche after that.   Verne Carrow  10/3/20178:23 AM

## 2015-10-22 NOTE — Progress Notes (Signed)
Pt ready for d/c, has been walking. Teach back education reviewed with pt and wife. Pt planning to quit smoking. Knows the importance of Brilinta/ASA. Reinforced NTG.  [phone] Ethelda ChickKristan Sherie Dobrowolski CES, ACSM 10:23 AM 10/22/2015

## 2015-10-22 NOTE — Telephone Encounter (Signed)
TCM Phone call .Marland Kitchen. Appt on 11/01/15 at 10:30 w/ Azalee CourseHao Meng [facility] .   Thanks

## 2015-10-22 NOTE — Telephone Encounter (Signed)
Patient [facility] 10/22/15.  Patient understands to follow up with Joshua CourseHao May 11/01/15 at 10:30 am. Patient understands discharge instructions. Patient understands medications and regiment. Patient understands to bring all medications to this visit.  Patient stated he is feeling good.No complaints.

## 2015-10-22 NOTE — Discharge Summary (Signed)
Discharge Summary    Patient ID: Joshua May,  MRN: [id number], DOB/AGE: Apr 05, 1975 40 y.o.  Admit date: 10/20/2015 Discharge date: 10/22/2015  Primary Care Provider: No PCP Per Patient Primary Cardiologist: Dr. Antoine PocheHochrein  Discharge Diagnoses    Active Problems:   Acute ST elevation myocardial infarction (STEMI) involving right coronary artery (HCC)   Acute MI, inferior wall, initial episode of care [facility](HCC)   CAD (coronary artery disease)   Allergies Allergies  Allergen Reactions  . Shellfish Allergy Anaphylaxis    Diagnostic Studies/Procedures   Coronary Stent Intervention  Left Heart Cath and Coronary Angiography 10/20/15   Prox RCA lesion, 50 %stenosed.  A STENT SYNERGY DES 3X16 drug eluting stent was successfully placed.  Mid Cx lesion, 100 %stenosed.  Post intervention, there is a 0% residual stenosis.  Ost 3rd Mrg to 3rd Mrg lesion, 80 %stenosed.  Post intervention, there is a 10% residual stenosis.  The left ventricular systolic function is normal.  LV end diastolic pressure is normal.  The left ventricular ejection fraction is 50-55% by visual estimate.  There is no mitral valve regurgitation.  Dist LAD lesion, 20 %stenosed.   1. Acute inferolateral STEMI secondary to occluded mid Circumflex artery.  2. Successful PTCA/DES x 1 mid Circumflex 3. Successful balloon angioplasty ostium OM1 4. Moderate non-flow limiting restenosis bare metal stent proximal to mid RCA 5. Preserved LV systolic function  Recommendations: Will admit to CCU. Continue ASA and Brilinta. Start beta blocker, high intensity statin. Echo in am. Would start Lisinopril in am if BP tolerates.   Transthoracic Echocardiography 10/21/15 Study Conclusions  - Left ventricle: The cavity size was mildly dilated. Systolic   function was mildly to moderately reduced. The estimated ejection   fraction was in the range of 40% to 45%. Diffuse hypokinesis.   Features are consistent with  a pseudonormal left ventricular   filling pattern, with concomitant abnormal relaxation and   increased filling pressure (grade 2 diastolic dysfunction).   Doppler parameters are consistent with indeterminate ventricular   filling pressure. - Aortic valve: Transvalvular velocity was within the normal range.   There was no stenosis. There was no regurgitation. - Mitral valve: Transvalvular velocity was within the normal range.   There was no evidence for stenosis. There was mild regurgitation. - Right ventricle: The cavity size was normal. Wall thickness was   normal. Systolic function was normal. - Tricuspid valve: There was no regurgitation.  _____________   History of Present Illness   Joshua May is a 40 yo male with history of HLD, tobacco abuse and CAD with prior MI and bare metal stent placement RCA in 2010 who [facility]15 with c/o severe chest pain, nausea, dyspnea.   Also with history of prior MI in [place] in [place] but no stent placed per pt. He had an inferior MI in March 2010 with placement of a bare metal stent in the RCA. He has not seen Dr. Antoine PocheHochrein since 2012. He has been off of all medical therapy except ASA. He is still smoking.   Chest pain began around 3 am on the morning of 10/20/15. Similar pain 2 days prior but did not seek medical attention. Pain is similar to pain he had with prior MI. EKG today with inferior Q-waves, 1 mm ST segment elevation. Code STEMI activated. Ongoing chest pain. Emergent cardiac cath planned.    Hospital Course  Full cath report above. Patient had successful stenting to his  mid circumflex with a STENT SYNERGY DES 3X16. His troponin peaked at greater than 65. He did have an episode of nonsustained VT following stent placement while in the CCU likely consistent with reperfusion arrhythmia.   Patient continues to abuse tobacco. Smoking cessation counseling was done and cessation was encouraged.  He will continue on  aspirin, Brilinta, metoprolol and atorvastatin. Case management was consulted as he he expressed concerns about being able to afford his medications.   His echo showed LVEF of 40-45%. He was started on an ACE inhibitor. He will follow-up with our office in one week.  He was seen today by Dr. Clifton James and deemed suitable for discharge. _____________  Discharge Vitals Blood pressure 107/74, pulse 88, temperature 98.3 F (36.8 C), temperature source Oral, resp. rate 15, height 5\' 11"  (1.803 m), weight 236 lb 5.3 oz (107.2 kg), SpO2 100 %.  Filed Weights   10/20/15 1129 10/20/15 1350  Weight: 240 lb (108.9 kg) 236 lb 5.3 oz (107.2 kg)    Labs & Radiologic Studies     CBC  Recent Labs  10/20/15 1144 10/21/15 0219  WBC 13.1* 15.0*  NEUTROABS 10.9*  --   HGB 14.6 14.0  HCT 44.5 43.9  MCV 95.9 95.4  PLT 301 302   Basic Metabolic Panel  Recent Labs  10/20/15 1144 10/21/15 0219  NA 137 137  K 4.3 3.9  CL 107 105  CO2 18* 25  GLUCOSE 141* 111*  BUN 16 9  CREATININE 1.25* 1.06  CALCIUM 10.0 9.2   Liver Function Tests  Recent Labs  10/20/15 1144  AST 52*  ALT 32  ALKPHOS 90  BILITOT 0.6  PROT 7.4  ALBUMIN 4.5    Cardiac Enzymes  Recent Labs  10/20/15 1423 10/20/15 1922 10/21/15 0219  TROPONINI 22.03* >65.00* >65.00*   Fasting Lipid Panel  Recent Labs  10/20/15 1144  CHOL 224*  HDL 28*  LDLCALC 141*  TRIG 273*  CHOLHDL 8.0    Disposition   Pt is being discharged home today in good condition.  Follow-up Plans & Appointments    Follow-up Information    Azalee Course, [place] Follow up on 11/01/2015.   Specialties:  Cardiology, Radiology Why:  at 10:30am for hospital follow up.  Contact information: [address] [place] [address] [phone]          Discharge Instructions    Amb Referral to Cardiac Rehabilitation    Complete by:  As directed    Diagnosis:   Coronary Stents PTCA STEMI     Diet - low sodium heart  healthy    Complete by:  As directed    Discharge instructions    Complete by:  As directed    NO HEAVY LIFTING OR SEXUAL ACTIVITY for 7 DAYS. NO DRIVING for 3-5 DAYS. NO SOAKING BATHS, HOT TUBS, POOLS, ETC., for 7 DAYS  Radial Site Care Refer to this sheet in the next few weeks. These instructions provide you with information on caring for yourself after your procedure. Your caregiver May also give you more specific instructions. Your treatment has been planned according to current medical practices, but problems sometimes occur. Call your caregiver if you have any problems or questions after your procedure. HOME CARE INSTRUCTIONS You May shower the day after the procedure.Remove the bandage (dressing) and gently wash the site with plain soap and water.Gently pat the site dry.  Do not apply powder or lotion to the site.  Do not submerge the affected  site in water for 3 to 5 days.  Inspect the site at least twice daily.  Do not flex or bend the affected arm for 24 hours.  No lifting over 5 pounds (2.3 kg) for 5 days after your procedure.  Do not drive home if you are discharged the same day of the procedure. Have someone else drive you.  You May drive 24 hours after the procedure unless otherwise instructed by your caregiver.  What to expect: Any bruising will usually fade within 1 to 2 weeks.  Blood that collects in the tissue (hematoma) May be painful to the touch. It should usually decrease in size and tenderness within 1 to 2 weeks.  SEEK IMMEDIATE MEDICAL CARE IF: You have unusual pain at the radial site.  You have redness, warmth, swelling, or pain at the radial site.  You have drainage (other than a small amount of blood on the dressing).  You have chills.  You have a fever or persistent symptoms for more than 72 hours.  You have a fever and your symptoms suddenly get worse.  Your arm becomes pale, cool, tingly, or numb.  You have heavy bleeding from the site. Hold pressure on  the site.   Increase activity slowly    Complete by:  As directed       Discharge Medications   Discharge Medication List as of 10/22/2015  9:54 AM    START taking these medications   Details  aspirin EC 81 MG tablet Take 1 tablet (81 mg total) by mouth daily., Starting Tue 10/22/2015, OTC    lisinopril (PRINIVIL,ZESTRIL) 5 MG tablet Take 1 tablet (5 mg total) by mouth daily., Starting Tue 10/22/2015, Normal    !! ticagrelor (BRILINTA) 90 MG TABS tablet Take 1 tablet (90 mg total) by mouth 2 (two) times daily., Starting Tue 10/22/2015, Normal    !! ticagrelor (BRILINTA) 90 MG TABS tablet Take 1 tablet (90 mg total) by mouth 2 (two) times daily., Starting Tue 10/22/2015, Print     !! - Potential duplicate medications found. Please discuss with provider.    CONTINUE these medications which have CHANGED   Details  metoprolol tartrate 37.5 MG TABS Take 37.5 mg by mouth 2 (two) times daily., Starting Tue 10/22/2015, Normal      CONTINUE these medications which have NOT CHANGED   Details  atorvastatin (LIPITOR) 80 MG tablet Take 80 mg by mouth daily.  , Historical Med      STOP taking these medications     aspirin 325 MG tablet      clopidogrel (PLAVIX) 75 MG tablet      oseltamivir (TAMIFLU) 75 MG capsule          Aspirin prescribed at discharge?  Yes High Intensity Statin Prescribed? (Lipitor 40-80mg  or Crestor 20-40mg ): Yes Beta Blocker Prescribed? Yes For EF 40% or less, Was ACEI/ARB Prescribed? Yes ADP Receptor Inhibitor Prescribed? (i.e. Plavix etc.-Includes Medically Managed Patients): Yes For EF <40%, Aldosterone Inhibitor Prescribed? No: EF 40-45% Was EF assessed during THIS hospitalization? Yes Was Cardiac Rehab II ordered? (Included Medically managed Patients): Yes   Outstanding Labs/Studies   BMP   Duration of Discharge Encounter   Greater than 30 minutes including physician time.  Signed, Little Ishikawa NP 10/22/2015, 12:17 PM

## 2015-10-22 NOTE — Progress Notes (Signed)
Spoke w pt. md signed pt assist form and pt will bing with him to first appt. Pt has clinic list. He has 30day free brilinta card and knows to call 800 number also. Gave pt match letter in case meds other than brilinta too exspensive.

## 2015-11-01 ENCOUNTER — Ambulatory Visit (INDEPENDENT_AMBULATORY_CARE_PROVIDER_SITE_OTHER): Payer: Self-pay | Admitting: Physician Assistant

## 2015-11-01 ENCOUNTER — Encounter: Payer: Self-pay | Admitting: Physician Assistant

## 2015-11-01 VITALS — BP 137/98 | HR 63 | Ht 71.0 in | Wt 241.8 lb

## 2015-11-01 DIAGNOSIS — Z720 Tobacco use: Secondary | ICD-10-CM

## 2015-11-01 DIAGNOSIS — I2510 Atherosclerotic heart disease of native coronary artery without angina pectoris: Secondary | ICD-10-CM

## 2015-11-01 DIAGNOSIS — I255 Ischemic cardiomyopathy: Secondary | ICD-10-CM

## 2015-11-01 DIAGNOSIS — E785 Hyperlipidemia, unspecified: Secondary | ICD-10-CM

## 2015-11-01 LAB — BASIC METABOLIC PANEL
BUN: 14 mg/dL (ref 7–25)
CALCIUM: 9.3 mg/dL (ref 8.6–10.3)
CO2: 22 mmol/L (ref 20–31)
CREATININE: 1.22 mg/dL (ref 0.60–1.35)
Chloride: 108 mmol/L (ref 98–110)
Glucose, Bld: 87 mg/dL (ref 65–99)
POTASSIUM: 4.9 mmol/L (ref 3.5–5.3)
Sodium: 142 mmol/L (ref 135–146)

## 2015-11-01 MED ORDER — ATORVASTATIN CALCIUM 80 MG PO TABS: 80.0000 mg | ORAL_TABLET | Freq: Every day | ORAL | 3 refills | Status: DC

## 2015-11-01 MED ORDER — LISINOPRIL 10 MG PO TABS: 10.0000 mg | ORAL_TABLET | Freq: Every day | ORAL | 3 refills | Status: DC

## 2015-11-01 NOTE — Progress Notes (Signed)
Cardiology Office Note    Date:  11/01/2015   ID:  Joshua May, DOB Dec 21, 1975, MRN [id number]  PCP:  No PCP Per Patient  Cardiologist:  Dr. Antoine Poche   Chief Complaint  Patient presents with  . Transitions Of Care    seen for Dr. Antoine Poche, 14 day TCM after recent inferior STEMI  . Follow-up    since pt left hospital he has not taken his atorvastaton, was not given a script for it     History of Present Illness:  Joshua May is a 40 y.o. male with PMH of Tobacco abuse, HLD, and CAD s/p prior MI and bare metal stent placement in RCA in 2010 who presented recently again to [facility] on 10/20/2015 with severe chest pain. He has not been seen by cardiology prior to recent admission since 2012. He has been off of all medications except for aspirin. He is still smoking. Initial EKG on arrival showed inferior Q waves with 1 mm ST segment elevation. Code STEMI was activated. With ongoing chest pain, patient was taken urgently to the cath lab. Cardiac catheterization performed on the same day showed 50% proximal RCA lesion, 100% mid LCx occlusion treated with 3 x 16 mm Synergy DES, 80% ostial OM 3 lesion treated with balloon angioplasty, 20% distal LAD lesion. Ejection fraction by LV gram was normal at 50-55%. No significant mitral regurgitation noted. Post procedure, he was placed on aspirin, Brilinta, beta blocker, and high-dose statin. Echocardiogram obtained on 10/21/2015 showed EF 40-45%, grade 2 diastolic dysfunction, mild MR. given LV dysfunction, ACE inhibitor was also started. Post cath, his serial troponin did trended up to greater than 65 indicating significant myocardial injury. He was discharged on 10/22/2015, prior to discharge, case manager was consulted as he expressed concern for not being able to afford his medication.  He presents today for cardiology office follow-up. He has not had any chest discomfort since he left the hospital. He denies any significant shortness of  breath. There is no lower extremity edema, orthopnea or paroxysmal nocturnal dyspnea. He did not receive his Lipitor from discharge, therefore he has not started on event. I have given him a prescription of Lipitor for him to start today. Otherwise he has been compliant with all other medications. We have emphasized on the needed to be compliant with DAPT. He has stopped smoking since discharge. I congratulated him on this. EKG today showed normal sinus rhythm without significant ST-T wave changes. Previous ST elevation in the inferolateral leads has resolved. I have reviewed his lipid panel during the hospitalization and encourage him to increase exercise and take statin medication. We will recheck a fasting lipid panel and the liver function tests in 2 months. His blood pressure is mildly elevated today, I will continue to up titrate his medication, I will change his lisinopril to 10 mg daily. Technically, it may be more beneficial for his metoprolol tartrate to be changed to metoprolol succinate or carvedilol, however cost is very much a concern in this case. He is doing well on current medication, therefore I will keep the metoprolol tartrate. He says if he does not go back to work today then he likely will lose his job, since he is doing so well from cardiology perspective, I have cleared him to go back to work. 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 He is trying to enrolled in Brilinta medication assistance program, I have also asked the patient to contact  us if he is unable to be enrolled for any reason as I would actually prefer him to be on Plavix if he cannot afford Brilinta. Previously he was off Plavix for 2 years without telling anybody because of financial issues.    Past Medical History:  Diagnosis Date  . Asthma   . 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 NO SIGNIFICANT CAD IN THE LEFT MAIN,LAD, OR LEFT CIRCUMFLEX. MILD  SEGMENTAL LV DYSFUNCTION WITH OVERALL PRESERVED LVEF. SUCCESSFUL PCI USING A SINGLE BARE-METAL STENT. b. STEMI 10/2015 STENT SYNERGY DES 3X16  to Circ.  Marland Kitchen Hypercholesteremia   . Tobacco abuse     Past Surgical History:  Procedure Laterality Date  . CARDIAC CATHETERIZATION N/A 10/20/2015   Procedure: Left Heart Cath and Coronary Angiography;  Surgeon: Kathleene Hazel, MD;  Location: [facility] INVASIVE CV LAB;  Service: Cardiovascular;  Laterality: N/A;  . CARDIAC CATHETERIZATION N/A 10/20/2015   Procedure: Coronary Stent Intervention;  Surgeon: Kathleene Hazel, MD;  Location: [facility] INVASIVE CV LAB;  Service: Cardiovascular;  Laterality: N/A;  . none    . OTHER SURGICAL HISTORY     NONE    Current Medications: Outpatient Medications Prior to Visit  Medication Sig Dispense Refill  . aspirin EC 81 MG tablet Take 1 tablet (81 mg total) by mouth daily. 30 tablet   . metoprolol tartrate 37.5 MG TABS Take 37.5 mg by mouth 2 (two) times daily. 60 tablet 12  . nitroGLYCERIN (NITROSTAT) 0.4 MG SL tablet Place 1 tablet (0.4 mg total) under the tongue every 5 (five) minutes as needed for chest pain. 25 tablet 2  . ticagrelor (BRILINTA) 90 MG TABS tablet Take 1 tablet (90 mg total) by mouth 2 (two) times daily. 60 tablet 12  . atorvastatin (LIPITOR) 80 MG tablet Take 80 mg by mouth daily.      Marland Kitchen lisinopril (PRINIVIL,ZESTRIL) 5 MG tablet Take 1 tablet (5 mg total) by mouth daily. 30 tablet 12  . ticagrelor (BRILINTA) 90 MG TABS tablet Take 1 tablet (90 mg total) by mouth 2 (two) times daily. (Patient not taking: Reported on 11/01/2015) 60 tablet 0   No facility-administered medications prior to visit.      Allergies:   Shellfish allergy   Social History   Social History  . Marital status: Married    Spouse name: N/A  . Number of children: N/A  . Years of education: N/A   Social History Main Topics  . Smoking status: Former Smoker    Packs/day: 0.50    Years: 17.00    Types: Cigarettes    . Smokeless tobacco: Never Used     Comment: quit since heart attack in 10/2015  . Alcohol use 4.8 oz/week    8 Shots of liquor per week  . Drug use:     Types: Marijuana  . Sexual activity: Not Asked   Other Topics Concern  . None   Social History Narrative   The patient is married with two children.     Family History:  The patient's family history includes Heart attack in his father; Other (age of onset: 62) in his sister.   ROS:   Please see the history of present illness.    ROS All other systems reviewed and are negative.   PHYSICAL EXAM:   VS:  BP (!) 137/98 (BP Location: Right Wrist, Patient Position: Sitting, Cuff Size: Normal)   Pulse 63   Ht 5\' 11"  (1.803 m)  Wt 241 lb 12.8 oz (109.7 kg)   SpO2 99%   BMI 33.72 kg/m    GEN: Well nourished, well developed, in no acute distress  HEENT: normal  Neck: no JVD, carotid bruits, or masses Cardiac: RRR; no murmurs, rubs, or gallops,no edema  Respiratory:  clear to auscultation bilaterally, normal work of breathing GI: soft, nontender, nondistended, + BS MS: no deformity or atrophy  Skin: warm and dry, no rash Neuro:  Alert and Oriented x 3, Strength and sensation are intact Psych: euthymic mood, full affect  Wt Readings from Last 3 Encounters:  11/01/15 241 lb 12.8 oz (109.7 kg)  10/20/15 236 lb 5.3 oz (107.2 kg)  06/25/10 229 lb (103.9 kg)      Studies/Labs Reviewed:   EKG:  EKG is ordered today.  The ekg ordered today demonstrates Normal sinus rhythm with inferolateral T-wave inversion.  Recent Labs: 10/20/2015: ALT 32 10/21/2015: Hemoglobin 14.0; Platelets 302 11/01/2015: BUN 14; Creat 1.22; Potassium 4.9; Sodium 142   Lipid Panel    Component Value Date/Time   CHOL 224 (H) 10/20/2015 1144   TRIG 273 (H) 10/20/2015 1144   HDL 28 (L) 10/20/2015 1144   CHOLHDL 8.0 10/20/2015 1144   VLDL 55 (H) 10/20/2015 1144   LDLCALC 141 (H) 10/20/2015 1144   LDLDIRECT 149.8 11/15/2009 0933    Additional  studies/ records that were reviewed today include:   Cardiac catheterization 10/20/2015 Conclusion     Prox RCA lesion, 50 %stenosed.  A STENT SYNERGY DES 3X16 drug eluting stent was successfully placed.  Mid Cx lesion, 100 %stenosed.  Post intervention, there is a 0% residual stenosis.  Ost 3rd Mrg to 3rd Mrg lesion, 80 %stenosed.  Post intervention, there is a 10% residual stenosis.  The left ventricular systolic function is normal.  LV end diastolic pressure is normal.  The left ventricular ejection fraction is 50-55% by visual estimate.  There is no mitral valve regurgitation.  Dist LAD lesion, 20 %stenosed.   1. Acute inferolateral STEMI secondary to occluded mid Circumflex artery.  2. Successful PTCA/DES x 1 mid Circumflex 3. Successful balloon angioplasty ostium OM1 4. Moderate non-flow limiting restenosis bare metal stent proximal to mid RCA 5. Preserved LV systolic function  Recommendations: Will admit to CCU. Continue ASA and Brilinta. Start beta blocker, high intensity statin. Echo in am. Would start Lisinopril in am if BP tolerates.      Echo 10/21/2015 LV EF: 40% -   45%  ------------------------------------------------------------------- Indications:      MI - acute 410.91.  ------------------------------------------------------------------- History:   PMH:  Acute inferolateral STEMI S/P stent. Smoker. Hyperlipidemia.  ------------------------------------------------------------------- Study Conclusions  - Left ventricle: The cavity size was mildly dilated. Systolic   function was mildly to moderately reduced. The estimated ejection   fraction was in the range of 40% to 45%. Diffuse hypokinesis.   Features are consistent with a pseudonormal left ventricular   filling pattern, with concomitant abnormal relaxation and   increased filling pressure (grade 2 diastolic dysfunction).   Doppler parameters are consistent with indeterminate  ventricular   filling pressure. - Aortic valve: Transvalvular velocity was within the normal range.   There was no stenosis. There was no regurgitation. - Mitral valve: Transvalvular velocity was within the normal range.   There was no evidence for stenosis. There was mild regurgitation. - Right ventricle: The cavity size was normal. Wall thickness was   normal. Systolic function was normal. - Tricuspid valve: There was no regurgitation.   ASSESSMENT:  1. Coronary artery disease involving native coronary artery of native heart without angina pectoris   2. Hyperlipidemia, unspecified hyperlipidemia type   3. Tobacco abuse   4. Cardiomyopathy, ischemic, baseline EF 40-45% by echo 10/2015      PLAN:  In order of problems listed above:  1. CAD: Previous bare metal stent after inferior STEMI 2010, recently had recurrent inferior STEMI with 100% mid RCA occlusion treated with Synergy DES and high-grade disease in OM 3 treated with balloon angioplasty. No obvious angina, patient has been doing well. Continue aspirin and Brilinta, patient has been advised to contact cardiology if unable to afford Brilinta for any reason, as I would prefer to switch him to Plavix if he cannot afford Brilinta. I have repeatedly emphasized the importance of DAPT as patient had a history of coming off of medication due to financial difficulties without telling anybody. Will obtain basic metabolic panel today given his underlying stage II CKD and recent initiation of lisinopril. I have also increased his lisinopril to 10mg  daily as his blood pressure in the office is mildly elevated. However he does not have a formal diagnosis of hypertension.  2. ICM with baseline EF 40-45%  - Despite having mid RCA occlusion treated with stent, serial troponin was greater than 65 indicating significant myocardial injury. Continue beta blocker and ACE inhibitor.  3. HLD: Last lipid panel obtained on 10/20/2015 showed cholesterol  224, triglyceride 273, HDL 28, LDL 141. Currently on high-dose statin, plan for 2 month repeat fasting lipid panel and LFTs.  4. Tobacco abuse: He has quit since recent heart attack, I congratulated him on this.    Medication Adjustments/Labs and Tests Ordered: Current medicines are reviewed at length with the patient today.  Concerns regarding medicines are outlined above.  Medication changes, Labs and Tests ordered today are listed in the Patient Instructions below. Patient Instructions  Medication Instructions:  INCREASE- Lisinopril 10 mg daily  Labwork: BMP today  Fasting Lipid Liver 2 months.  Testing/Procedures: None Ordered  Follow-Up: Your physician recommends that you schedule a follow-up appointment in: 2 Months with Dr Antoine Poche   Any Other Special Instructions Will Be Listed Below (If Applicable).     If you need a refill on your cardiac medications before your next appointment, please call your pharmacy.      Ramond Dial, [place]  11/01/2015 11:36 PM    [facility] [address] [place], [place], [place]  [address] Phone: [phone]; Fax: [phone]

## 2015-11-01 NOTE — Progress Notes (Addendum)
Call received from wife, states PA wrote a Rx for Lipitor at his appt today. [facility]. Explained [facility] days post [facility] (dc on 10/22/2015, MATCH expired on 10/29/2015) States Lipitor and cost of his other Rx totalled $40.00 today at [facility].  Isidoro DonningAlesia Kiani Wurtzel RN CCM Case Mgmt phone [phone]

## 2015-11-01 NOTE — Patient Instructions (Signed)
Medication Instructions:  INCREASE- Lisinopril 10 mg daily  Labwork: BMP today  Fasting Lipid Liver 2 months.  Testing/Procedures: None Ordered  Follow-Up: Your physician recommends that you schedule a follow-up appointment in: 2 Months with Dr Antoine PocheHochrein   Any Other Special Instructions Will Be Listed Below (If Applicable).     If you need a refill on your cardiac medications before your next appointment, please call your pharmacy.

## 2015-11-04 ENCOUNTER — Telehealth: Payer: Self-pay | Admitting: Physician Assistant

## 2015-11-04 DIAGNOSIS — I25810 Atherosclerosis of coronary artery bypass graft(s) without angina pectoris: Secondary | ICD-10-CM

## 2015-11-04 NOTE — Telephone Encounter (Signed)
-----   Message from [place]ao Meng, [place] sent at 11/04/2015  9:51 AM EDT ----- Kidney function worsened slightly, would not change any medication at this time, recommend repeat BMET in 1 week

## 2015-11-04 NOTE — Telephone Encounter (Signed)
Spoke to pt and gave results. Informed him pf instructions to repeat lab work in 1 week. Pt verbalized understanding.

## 2015-11-12 ENCOUNTER — Telehealth (HOSPITAL_COMMUNITY): Payer: Self-pay | Admitting: *Deleted

## 2015-12-24 ENCOUNTER — Telehealth: Payer: Self-pay | Admitting: Cardiology

## 2015-12-24 MED ORDER — METOPROLOL TARTRATE 25 MG PO TABS: 37.5000 mg | ORAL_TABLET | Freq: Two times a day (BID) | ORAL | 5 refills | Status: DC

## 2015-12-24 NOTE — Telephone Encounter (Signed)
Spoke with pharmacy and the Metoprolol 37.5 mg tablets are on backorder  New Rx sent for Metoprolol 25 mg 1 and 1/2 tablet twice a day to [facility] Advised Joshua May

## 2015-12-24 NOTE — Telephone Encounter (Signed)
Left message to call back  

## 2015-12-24 NOTE — Telephone Encounter (Signed)
Returning your call. °

## 2015-12-24 NOTE — Telephone Encounter (Signed)
Pt's Metoprolol 37.5 mg is on back order,what can the pt use in the place of it? Please call this new medicine to [facility] on [address]. Please call today,pt have been out of his medicine for 2 days.

## 2015-12-24 NOTE — Telephone Encounter (Signed)
New message ° ° ° ° °

## 2016-01-13 NOTE — Progress Notes (Deleted)
Cardiology Office Note   Date:  01/13/2016   ID:  Joshua May, DOB [birth year], MRN [id number]  PCP:  No PCP Per Patient  Cardiologist:   Rollene RotundaJames Raelynne Ludwick, MD  Referring:  ***  No chief complaint on file.     History of Present Illness: Joshua May is a 40 y.o. male who presents for ***  Joshua May is a 40 y.o. male with PMH of Tobacco abuse, HLD, and CAD s/p prior MI and bare metal stent placement in RCA in 2010 who [facility] with severe chest pain.  He was not taking any meds and had not had any follow up.  Cardiac catheterization performed on the same day showed 50% proximal RCA lesion, 100% mid LCx occlusion treated with 3 x 16 mm Synergy DES, 80% ostial OM 3 lesion treated with balloon angioplasty, 20% distal LAD lesion. Ejection fraction by LV gram was normal at 50-55%. No significant mitral regurgitation noted. Post procedure, he was placed on aspirin, Brilinta, beta blocker, and high-dose statin. Echocardiogram obtained on 10/21/2015 showed EF 40-45%, grade 2 diastolic dysfunction, mild MR. given LV dysfunction, ACE inhibitor was also started. Post cath, his serial troponin did trended up to greater than 65 indicating significant myocardial injury. He was discharged on 10/22/2015, prior to discharge, the case manager was consulted as he expressed concern for not being able to afford his medication.  He has been back once for follow up seeing Azalee CourseHao Meng.  ***  He presents today for cardiology office follow-up. He has not had any chest discomfort since he left the hospital. He denies any significant shortness of breath. There is no lower extremity edema, orthopnea or paroxysmal nocturnal dyspnea. He did not receive his Lipitor from discharge, therefore he has not started on event. I have given him a prescription of Lipitor for him to start today. Otherwise he has been compliant with all other medications. We have emphasized on the  needed to be compliant with DAPT. He has stopped smoking since discharge. I congratulated him on this. EKG today showed normal sinus rhythm without significant ST-T wave changes. Previous ST elevation in the inferolateral leads has resolved. I have reviewed his lipid panel during the hospitalization and encourage him to increase exercise and take statin medication. We will recheck a fasting lipid panel and the liver function tests in 2 months. His blood pressure is mildly elevated today, I will continue to up titrate his medication, I will change his lisinopril to 10 mg daily. Technically, it may be more beneficial for his metoprolol tartrate to be changed to metoprolol succinate or carvedilol, however cost is very much a concern in this case. He is doing well on current medication, therefore I will keep the metoprolol tartrate. He says if he does not go back to work today then he likely will lose his job, since he is doing so well from cardiology perspective, I have cleared him to go back to work. 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 He is trying to enrolled in Brilinta medication assistance program, I have also asked the patient to contact us if he is unable to be enrolled for any reason as I would actually prefer him to be on Plavix if he cannot afford Brilinta. Previously he was off Plavix for 2 years without telling anybody because of financial issues.    Past Medical History:  Diagnosis Date  . Asthma   .  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 NO SIGNIFICANT CAD IN THE LEFT MAIN,LAD, OR LEFT CIRCUMFLEX. MILD SEGMENTAL LV DYSFUNCTION WITH OVERALL PRESERVED LVEF. SUCCESSFUL PCI USING A SINGLE BARE-METAL STENT. b. STEMI 10/2015 STENT SYNERGY DES 3X16  to Circ.  Marland Kitchen. Hypercholesteremia   . Tobacco abuse     Past Surgical History:  Procedure Laterality Date  . CARDIAC CATHETERIZATION N/A 10/20/2015   Procedure: Left Heart Cath and  Coronary Angiography;  Surgeon: Kathleene Hazelhristopher D McAlhany, MD;  [facility];  Service: Cardiovascular;  Laterality: N/A;  . CARDIAC CATHETERIZATION N/A 10/20/2015   Procedure: Coronary Stent Intervention;  Surgeon: Kathleene Hazelhristopher D McAlhany, MD;  Location: [facility] INVASIVE CV LAB;  Service: Cardiovascular;  Laterality: N/A;  . none    . OTHER SURGICAL HISTORY     NONE     Current Outpatient Prescriptions  Medication Sig Dispense Refill  . aspirin EC 81 MG tablet Take 1 tablet (81 mg total) by mouth daily. 30 tablet   . atorvastatin (LIPITOR) 80 MG tablet Take 1 tablet (80 mg total) by mouth daily. 90 tablet 3  . lisinopril (PRINIVIL,ZESTRIL) 10 MG tablet Take 1 tablet (10 mg total) by mouth daily. 90 tablet 3  . metoprolol tartrate (LOPRESSOR) 25 MG tablet Take 1.5 tablets (37.5 mg total) by mouth 2 (two) times daily. 90 tablet 5  . nitroGLYCERIN (NITROSTAT) 0.4 MG SL tablet Place 1 tablet (0.4 mg total) under the tongue every 5 (five) minutes as needed for chest pain. 25 tablet 2  . ticagrelor (BRILINTA) 90 MG TABS tablet Take 1 tablet (90 mg total) by mouth 2 (two) times daily. 60 tablet 12   No current facility-administered medications for this visit.     Allergies:   Shellfish allergy    ROS:  Please see the history of present illness.   Otherwise, review of systems are positive for {NONE DEFAULTED:18576::"none"}.   All other systems are reviewed and negative.    PHYSICAL EXAM: VS:  There were no vitals taken for this visit. , BMI There is no height or weight on file to calculate BMI. GENERAL:  Well appearing NECK:  No jugular venous distention, waveform within normal limits, carotid upstroke brisk and symmetric, no bruits, no thyromegaly LYMPHATICS:  No cervical, inguinal adenopathy LUNGS:  Clear to auscultation bilaterally BACK:  No CVA tenderness CHEST:  Unremarkable HEART:  PMI not displaced or sustained,S1 and S2 within normal limits, no S3, no S4, no clicks, no rubs,  *** murmurs ABD:  Flat, positive bowel sounds normal in frequency in pitch, no bruits, no rebound, no guarding, no midline pulsatile mass, no hepatomegaly, no splenomegaly EXT:  2 plus pulses throughout, no edema, no cyanosis no clubbing    EKG:  EKG {ACTION; IS/IS ZOX:[id number]}OT:[id number]} ordered today. The ekg ordered today demonstrates ***   Recent Labs: 10/20/2015: ALT 32 10/21/2015: Hemoglobin 14.0; Platelets 302 11/01/2015: BUN 14; Creat 1.22; Potassium 4.9; Sodium 142    Lipid Panel    Component Value Date/Time   CHOL 224 (H) 10/20/2015 1144   TRIG 273 (H) 10/20/2015 1144   HDL 28 (L) 10/20/2015 1144   CHOLHDL 8.0 10/20/2015 1144   VLDL 55 (H) 10/20/2015 1144   LDLCALC 141 (H) 10/20/2015 1144   LDLDIRECT 149.8 11/15/2009 0933      Wt Readings from Last 3 Encounters:  11/01/15 241 lb 12.8 oz (109.7 kg)  10/20/15 236 lb 5.3 oz (107.2 kg)  06/25/10 229 lb (103.9 kg)  Other studies Reviewed: Additional studies/ records that were reviewed today include: ***. Review of the above records demonstrates:  Please see elsewhere in the note.  ***   ASSESSMENT AND PLAN:  CAD:  ***  ICM:  EF 40 - 45%.  ***  HLD:  ***  CKD:  ***  TOBACCO ABUSE:  ***   Current medicines are reviewed at length with the patient today.  The patient {ACTIONS; HAS/DOES NOT HAVE:19233} concerns regarding medicines.  The following changes have been made:  {PLAN; NO CHANGE:13088:s}  Labs/ tests ordered today include: *** No orders of the defined types were placed in this encounter.    Disposition:   FU with ***    Signed, Rollene Rotunda, MD  01/13/2016 5:09 PM    [facility]

## 2016-01-14 ENCOUNTER — Ambulatory Visit: Payer: Self-pay | Admitting: Cardiology

## 2016-01-14 ENCOUNTER — Encounter: Payer: Self-pay | Admitting: *Deleted

## 2016-10-04 ENCOUNTER — Encounter: Payer: Self-pay | Admitting: *Deleted

## 2016-10-04 DIAGNOSIS — Z006 Encounter for examination for normal comparison and control in clinical research program: Secondary | ICD-10-CM

## 2017-02-27 ENCOUNTER — Other Ambulatory Visit: Payer: Self-pay

## 2017-02-27 ENCOUNTER — Inpatient Hospital Stay (HOSPITAL_COMMUNITY)
Admission: EM | Admit: 2017-02-27 | Discharge: 2017-03-05 | DRG: 175 | Disposition: A | Discharge status: Home / Self Care | Payer: Medicaid Other | Attending: Internal Medicine | Admitting: Internal Medicine

## 2017-02-27 ENCOUNTER — Encounter (HOSPITAL_COMMUNITY): Payer: Self-pay | Admitting: Emergency Medicine

## 2017-02-27 ENCOUNTER — Emergency Department (HOSPITAL_COMMUNITY): Payer: Medicaid Other

## 2017-02-27 DIAGNOSIS — I5043 Acute on chronic combined systolic (congestive) and diastolic (congestive) heart failure: Secondary | ICD-10-CM | POA: Diagnosis present

## 2017-02-27 DIAGNOSIS — I2699 Other pulmonary embolism without acute cor pulmonale: Principal | ICD-10-CM | POA: Diagnosis present

## 2017-02-27 DIAGNOSIS — I2510 Atherosclerotic heart disease of native coronary artery without angina pectoris: Secondary | ICD-10-CM | POA: Diagnosis present

## 2017-02-27 DIAGNOSIS — I472 Ventricular tachycardia: Secondary | ICD-10-CM | POA: Diagnosis not present

## 2017-02-27 DIAGNOSIS — Z6833 Body mass index (BMI) 33.0-33.9, adult: Secondary | ICD-10-CM

## 2017-02-27 DIAGNOSIS — I5022 Chronic systolic (congestive) heart failure: Secondary | ICD-10-CM | POA: Diagnosis present

## 2017-02-27 DIAGNOSIS — Z832 Family history of diseases of the blood and blood-forming organs and certain disorders involving the immune mechanism: Secondary | ICD-10-CM

## 2017-02-27 DIAGNOSIS — R011 Cardiac murmur, unspecified: Secondary | ICD-10-CM | POA: Diagnosis present

## 2017-02-27 DIAGNOSIS — E7800 Pure hypercholesterolemia, unspecified: Secondary | ICD-10-CM | POA: Diagnosis present

## 2017-02-27 DIAGNOSIS — Z91013 Allergy to seafood: Secondary | ICD-10-CM

## 2017-02-27 DIAGNOSIS — E785 Hyperlipidemia, unspecified: Secondary | ICD-10-CM | POA: Diagnosis present

## 2017-02-27 DIAGNOSIS — G4733 Obstructive sleep apnea (adult) (pediatric): Secondary | ICD-10-CM | POA: Diagnosis present

## 2017-02-27 DIAGNOSIS — I252 Old myocardial infarction: Secondary | ICD-10-CM

## 2017-02-27 DIAGNOSIS — Y831 Surgical operation with implant of artificial internal device as the cause of abnormal reaction of the patient, or of later complication, without mention of misadventure at the time of the procedure: Secondary | ICD-10-CM | POA: Diagnosis present

## 2017-02-27 DIAGNOSIS — I340 Nonrheumatic mitral (valve) insufficiency: Secondary | ICD-10-CM | POA: Diagnosis present

## 2017-02-27 DIAGNOSIS — Z9119 Patient's noncompliance with other medical treatment and regimen: Secondary | ICD-10-CM

## 2017-02-27 DIAGNOSIS — E669 Obesity, unspecified: Secondary | ICD-10-CM | POA: Diagnosis present

## 2017-02-27 DIAGNOSIS — I255 Ischemic cardiomyopathy: Secondary | ICD-10-CM | POA: Diagnosis present

## 2017-02-27 DIAGNOSIS — Z79899 Other long term (current) drug therapy: Secondary | ICD-10-CM

## 2017-02-27 DIAGNOSIS — J45909 Unspecified asthma, uncomplicated: Secondary | ICD-10-CM | POA: Diagnosis present

## 2017-02-27 DIAGNOSIS — Z87891 Personal history of nicotine dependence: Secondary | ICD-10-CM

## 2017-02-27 DIAGNOSIS — Z955 Presence of coronary angioplasty implant and graft: Secondary | ICD-10-CM

## 2017-02-27 DIAGNOSIS — Z7982 Long term (current) use of aspirin: Secondary | ICD-10-CM

## 2017-02-27 DIAGNOSIS — I25119 Atherosclerotic heart disease of native coronary artery with unspecified angina pectoris: Secondary | ICD-10-CM | POA: Diagnosis present

## 2017-02-27 DIAGNOSIS — T82855A Stenosis of coronary artery stent, initial encounter: Secondary | ICD-10-CM | POA: Diagnosis present

## 2017-02-27 DIAGNOSIS — Z8249 Family history of ischemic heart disease and other diseases of the circulatory system: Secondary | ICD-10-CM

## 2017-02-27 DIAGNOSIS — I2721 Secondary pulmonary arterial hypertension: Secondary | ICD-10-CM | POA: Diagnosis present

## 2017-02-27 HISTORY — DX: Hyperlipidemia, unspecified: E78.5

## 2017-02-27 HISTORY — DX: Obstructive sleep apnea (adult) (pediatric): G47.33

## 2017-02-27 HISTORY — DX: Ischemic cardiomyopathy: I25.5

## 2017-02-27 HISTORY — DX: Chronic systolic (congestive) heart failure: I50.22

## 2017-02-27 HISTORY — DX: Other pulmonary embolism without acute cor pulmonale: I26.99

## 2017-02-27 HISTORY — DX: ST elevation (STEMI) myocardial infarction involving other coronary artery of inferior wall: I21.19

## 2017-02-27 HISTORY — DX: ST elevation (STEMI) myocardial infarction involving other sites: I21.29

## 2017-02-27 HISTORY — DX: Nonrheumatic mitral (valve) insufficiency: I34.0

## 2017-02-27 LAB — BASIC METABOLIC PANEL
ANION GAP: 9 (ref 5–15)
BUN: 14 mg/dL (ref 6–20)
CALCIUM: 9 mg/dL (ref 8.9–10.3)
CO2: 22 mmol/L (ref 22–32)
Chloride: 109 mmol/L (ref 101–111)
Creatinine, Ser: 1.32 mg/dL — ABNORMAL HIGH (ref 0.61–1.24)
GFR calc non Af Amer: >60 mL/min (ref >60)
GLUCOSE: 96 mg/dL (ref 65–99)
Potassium: 4.2 mmol/L (ref 3.5–5.1)
Sodium: 140 mmol/L (ref 135–145)

## 2017-02-27 LAB — LIPASE, BLOOD: Lipase: 27 U/L (ref 11–51)

## 2017-02-27 LAB — CBC
HEMATOCRIT: 38.8 % — AB (ref 39.0–52.0)
HEMOGLOBIN: 12.2 g/dL — AB (ref 13.0–17.0)
MCH: 28.2 pg (ref 26.0–34.0)
MCHC: 31.4 g/dL (ref 30.0–36.0)
MCV: 89.6 fL (ref 78.0–100.0)
Platelets: 272 10*3/uL (ref 150–400)
RBC: 4.33 MIL/uL (ref 4.22–5.81)
RDW: 13.5 % (ref 11.5–15.5)
WBC: 8.9 10*3/uL (ref 4.0–10.5)

## 2017-02-27 LAB — I-STAT TROPONIN, ED: TROPONIN I, POC: 0.01 ng/mL (ref 0.00–0.08)

## 2017-02-27 LAB — D-DIMER, QUANTITATIVE (NOT AT ARMC): D DIMER QUANT: 5.92 ug{FEU}/mL — AB (ref 0.00–0.50)

## 2017-02-27 MED ORDER — IOPAMIDOL (ISOVUE-370) INJECTION 76%
INTRAVENOUS | Status: AC
  Administered 2017-02-28: 100 mL via INTRAVENOUS
  Filled 2017-02-27: qty 100

## 2017-02-27 NOTE — ED Triage Notes (Signed)
Reports epigastric pain that started three days ago and SOB with lying down and exertion for the last 2 weeks.  Also reports that abdomen is swollen.  Extensive cardiac history.  Also c/o n/v/d for last two days.

## 2017-02-27 NOTE — ED Notes (Signed)
ED Provider at bedside. 

## 2017-02-27 NOTE — ED Notes (Signed)
Patient transported to CT 

## 2017-02-27 NOTE — ED Provider Notes (Signed)
[facility] EMERGENCY DEPARTMENT Provider Note   CSN: [id number] Arrival date & time: 02/27/17  1928     History   Chief Complaint Chief Complaint  Patient presents with  . Abdominal Pain  . Shortness of Breath    HPI Joshua May is a 42 y.o. male with past medical history significant for CAD, multiple MIs with stent placement last in 2017, asthma, hyperlipidemia, presenting with 3 weeks of worsening shortness of breath when lying down which he describes as episodic apnea and gasping for air during the night as he is falling asleep.  He has been having to sleep with multiple pillows to keep his head elevated.  Also reports worsening shortness of breath on exertion over the last 2 weeks. He also endorses 3 days of epigastric non-radiating pain worse after meals, nausea/vomiting/nonbloody diarrhea.  He reports that the last vomiting and diarrhea was prior to ED arrival.  Nothing since in the emergency department.  His epigastric pain is exacerbated by lying on his side or movement.  He reports being comfortable at this time.  No chest pain. Patient denies any known ill contacts.  Also reports a sensation of something being stuck in his throat which is something he experienced with his prior 3 MIs. Otherwise states that this does not feel like his prior MIs.  Denies history of DVT/PE, prolonged immobilization, recent surgery, cough, hemoptysis, leg swelling or calf pain. Has been noncompliant with medications including antihypertensives, statin and nitroglycerin for the last 7 months. Current every day smoker of 5 cigarettes/day on average over the last 10-15 years.  HPI  Past Medical History:  Diagnosis Date  . Asthma   . 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 NO SIGNIFICANT CAD IN THE LEFT MAIN,LAD, OR LEFT CIRCUMFLEX. MILD SEGMENTAL LV DYSFUNCTION WITH OVERALL PRESERVED LVEF. SUCCESSFUL PCI USING A SINGLE BARE-METAL STENT. b.  STEMI 10/2015 STENT SYNERGY DES 3X16  to Circ.  Marland Kitchen Hypercholesteremia   . Tobacco abuse     Patient Active Problem List   Diagnosis Date Noted  . Pulmonary embolism (HCC) 02/28/2017  . CAD (coronary artery disease)   . Acute MI, inferior wall, initial episode of care (HCC) 10/20/2015  . Acute ST elevation myocardial infarction (STEMI) involving right coronary artery (HCC)   . DYSPHAGIA UNSPECIFIED 09/04/2008  . HLD (hyperlipidemia) 04/27/2008  . SLEEP APNEA 04/27/2008  . TOBACCO ABUSE 04/26/2008  . ASTHMA 04/26/2008    Past Surgical History:  Procedure Laterality Date  . CARDIAC CATHETERIZATION N/A 10/20/2015   Procedure: Left Heart Cath and Coronary Angiography;  Surgeon: Kathleene Hazel, MD;  Location[facility] INVASIVE CV LAB;  Service: Cardiovascular;  Laterality: N/A;  . CARDIAC CATHETERIZATION N/A 10/20/2015   Procedure: Coronary Stent Intervention;  Surgeon: Kathleene Hazel, MD;  Location: [facility] INVASIVE CV LAB;  Service: Cardiovascular;  Laterality: N/A;  . none    . OTHER SURGICAL HISTORY     NONE       Home Medications    Prior to Admission medications   Medication Sig Start Date End Date Taking? Authorizing Provider  aspirin EC 81 MG tablet Take 1 tablet (81 mg total) by mouth daily. 10/22/15  Yes Little Ishikawa, NP  nitroGLYCERIN (NITROSTAT) 0.4 MG SL tablet Place 1 tablet (0.4 mg total) under the tongue every 5 (five) minutes as needed for chest pain. 10/22/15  Yes Little Ishikawa, NP  atorvastatin (LIPITOR) 80 MG tablet Take 1 tablet (  80 mg total) by mouth daily. Patient not taking: Reported on 02/27/2017 11/01/15   Azalee Course, PA  lisinopril (PRINIVIL,ZESTRIL) 10 MG tablet Take 1 tablet (10 mg total) by mouth daily. Patient not taking: Reported on 02/27/2017 11/01/15   Azalee Course, PA  metoprolol tartrate (LOPRESSOR) 25 MG tablet Take 1.5 tablets (37.5 mg total) by mouth 2 (two) times daily. Patient not taking: Reported on 02/27/2017 12/24/15   Azalee Course, PA  ticagrelor  (BRILINTA) 90 MG TABS tablet Take 1 tablet (90 mg total) by mouth 2 (two) times daily. Patient not taking: Reported on 02/27/2017 10/22/15   Little Ishikawa, NP    Family History Family History  Problem Relation Age of Onset  . Other Sister 37       THE PATIENT REPORTS A SISTER WITH A MYOCARDIAL INFARCTION IN HER 30s.(SHE IS NOW S/P CABG)  . Heart attack Father     Social History Social History   Tobacco Use  . Smoking status: Former Smoker    Packs/day: 0.50    Years: 17.00    Pack years: 8.50    Types: Cigarettes  . Smokeless tobacco: Never Used  . Tobacco comment: quit since heart attack in 10/2015  Substance Use Topics  . Alcohol use: Yes    Alcohol/week: 4.8 oz    Types: 8 Shots of liquor per week  . Drug use: Yes    Types: Marijuana     Allergies   Shellfish allergy   Review of Systems Review of Systems  Constitutional: Positive for chills. Negative for diaphoresis, fatigue and fever.  HENT: Negative for congestion, sore throat, tinnitus, trouble swallowing and voice change.   Eyes: Negative for visual disturbance.  Respiratory: Positive for apnea and shortness of breath. Negative for cough, choking, chest tightness, wheezing and stridor.   Cardiovascular: Negative for chest pain, palpitations and leg swelling.  Gastrointestinal: Positive for abdominal pain, diarrhea, nausea and vomiting. Negative for blood in stool.       Nonradiating epigastric pain  Genitourinary: Negative for difficulty urinating, dysuria, flank pain, frequency and hematuria.  Musculoskeletal: Negative for arthralgias, back pain, gait problem, joint swelling, myalgias, neck pain and neck stiffness.  Skin: Negative for color change, pallor and rash.  Neurological: Negative for dizziness, seizures, syncope, weakness, light-headedness and headaches.     Physical Exam Updated Vital Signs BP 119/78   Pulse 89   Temp 98.3 F (36.8 C) (Oral)   Resp 18   Ht 5\' 11"  (1.803 m)   Wt 104.3 kg (230  lb)   SpO2 100%   BMI 32.08 kg/m   Physical Exam  Constitutional: He appears well-developed and well-nourished.  Non-toxic appearance. He does not appear ill. No distress.  Afebrile, nontoxic-appearing, sitting comfortably in bed no acute distress.  He is speaking in full sentences without difficulties.  HENT:  Head: Normocephalic and atraumatic.  Eyes: Conjunctivae are normal.  Neck: Neck supple.  Cardiovascular: Normal rate and regular rhythm.  Murmur heard. Holosystolic murmur  Pulmonary/Chest: Effort normal and breath sounds normal. No stridor. No respiratory distress. He has no wheezes. He has no rhonchi. He has no rales. He exhibits no tenderness.  Abdominal: Soft. Normal appearance and bowel sounds are normal. There is no tenderness. There is no rigidity, no rebound, no guarding, no CVA tenderness, no tenderness at McBurney's point and negative Murphy's sign.  Abdomen is soft and nontender to palpation.  Musculoskeletal: He exhibits no edema.  Neurological: He is alert.  Skin: Skin is warm  and dry. No rash noted. He is not diaphoretic. No cyanosis or erythema. No pallor.  Psychiatric: He has a normal mood and affect.  Nursing note and vitals reviewed.    ED Treatments / Results  Labs (all labs ordered are listed, but only abnormal results are displayed) Labs Reviewed  BASIC METABOLIC PANEL - Abnormal; Notable for the following components:      Result Value   Creatinine, Ser 1.32 (*)    All other components within normal limits  CBC - Abnormal; Notable for the following components:   Hemoglobin 12.2 (*)    HCT 38.8 (*)    All other components within normal limits  D-DIMER, QUANTITATIVE (NOT AT [facility]) - Abnormal; Notable for the following components:   D-Dimer, Quant 5.92 (*)    All other components within normal limits  LIPASE, BLOOD  URINALYSIS, ROUTINE W REFLEX MICROSCOPIC  I-STAT TROPONIN, ED  I-STAT TROPONIN, ED    EKG  EKG  Interpretation  Date/Time:  Saturday February 27 2017 19:38:46 EST Ventricular Rate:  95 PR Interval:  138 QRS Duration: 78 QT Interval:  368 QTC Calculation: 462 R Axis:   72 Text Interpretation:  Normal sinus rhythm Possible Left atrial enlargement Possible Inferior infarct , age undetermined ST & T wave abnormality, consider lateral ischemia Abnormal ECG Confirmed by Tilden Fossa [phone]) on 02/27/2017 8:51:54 PM       Radiology Dg Chest 2 View  Result Date: 02/27/2017 CLINICAL DATA:  Epigastric pain for 3 days, shortness of breath when lying down and with exertion for 2 weeks, nausea, vomiting, diarrhea abdominal swelling, history cardiac disease (coronary artery disease post MI and coronary stenting), asthma, former smoker EXAM: CHEST  2 VIEW COMPARISON:  03/31/2008 FINDINGS: Enlargement of cardiac silhouette. Mediastinal contours and pulmonary vascularity normal. Minimal chronic peribronchial thickening. Tiny RIGHT pleural effusion. No acute infiltrate, LEFT pleural effusion or pneumothorax. Bones unremarkable. IMPRESSION: Enlargement of cardiac silhouette. Minimal chronic bronchitic changes and tiny RIGHT pleural effusion without acute infiltrate. Electronically Signed   By: Ulyses Southward M.D.   On: 02/27/2017 20:17   Ct Angio Chest Pe W And/or Wo Contrast  Result Date: 02/28/2017 CLINICAL DATA:  Acute onset of epigastric abdominal pain. Shortness of breath with exertion and when lying down. Elevated D-dimer. EXAM: CT ANGIOGRAPHY CHEST WITH CONTRAST TECHNIQUE: Multidetector CT imaging of the chest was performed using the standard protocol during bolus administration of intravenous contrast. Multiplanar CT image reconstructions and MIPs were obtained to evaluate the vascular anatomy. CONTRAST:  ISOVUE-370 IOPAMIDOL (ISOVUE-370) INJECTION 76% COMPARISON:  Chest radiograph performed 02/27/2017 FINDINGS: Cardiovascular: A small pulmonary infarct is suspected at the right lung base, raising  concern for mild mostly resolved pulmonary embolus. No central pulmonary embolus is identified. The heart is borderline enlarged. The thoracic aorta is not well assessed but appears grossly unremarkable. The great vessels are grossly unremarkable in appearance. Mediastinum/Nodes: No mediastinal lymphadenopathy is seen. No pericardial effusion is identified. The visualized portions of the thyroid gland are unremarkable. No axillary lymphadenopathy is appreciated. Lungs/Pleura: A small right pleural effusion is noted. The hazy wedge-shaped opacity at the right lung base is suspicious for pulmonary infarct. No pneumothorax is seen. The left lung appears clear. No masses are identified. Upper Abdomen: The visualized portions of the liver and spleen are unremarkable. There is reflux of contrast into the hepatic veins and IVC. Musculoskeletal: No acute osseous abnormalities are identified. The visualized musculature is unremarkable in appearance. Review of the MIP images confirms the above findings.  IMPRESSION: 1. Small pulmonary infarct suspected at the right lung base, raising concern for mild mostly resolved pulmonary embolus. 2. Associated small right pleural effusion noted. 3. Borderline cardiomegaly. Critical Value/emergent results were called by telephone at the time of interpretation on 02/28/2017 at 12:44 am to Dr. Wilkie AyeHorton, who verbally acknowledged these results. Electronically Signed   By: Roanna RaiderJeffery  Chang M.D.   On: 02/28/2017 00:47    Procedures Procedures (including critical care time)   Medications Ordered in ED Medications  enoxaparin (LOVENOX) injection 105 mg (not administered)  iopamidol (ISOVUE-370) 76 % injection (100 mLs Intravenous Contrast Given 02/28/17 0001)  fentaNYL (SUBLIMAZE) injection 100 mcg (100 mcg Intravenous Given 02/28/17 0101)  enoxaparin (LOVENOX) injection 105 mg (105 mg Subcutaneous Given 02/28/17 0147)     Initial Impression / Assessment and Plan / ED Course  I have  reviewed the triage vital signs and the nursing notes.  Pertinent labs & imaging results that were available during my care of the patient were reviewed by me and considered in my medical decision making (see chart for details).     Inferolateral STEMI in 2017 with stent placement. Patient fell to follow up over the last year and has been noncompliant with medications over the last 7 months.  Presenting with worsening shortness of breath on exertion over the last 3 weeks. Sleep apnea and epigastric pain shifting to the right side when he lays on his side.  Elevated Dimer at 5.92, CTa with right lower lobe small pulmonary infarct and small effusion likely mild resolving PE.  CXR with worsening cardiomegally Last ECHO 2017: LV EF 40-45% with diffuse hypokinesis  EKG with some mild worsening t-wave inversions since last tracing. Negative delta troponin  Will call for admission for rule out given history, mild ekg changes, worsening cardiomegaly, poor follow up and compliance. Patient may need repeat echo.  Dr. Antionette Charpyd admitted patient.  Final Clinical Impressions(s) / ED Diagnoses   Final diagnoses:  Other acute pulmonary embolism without acute cor pulmonale [facility](HCC)    ED Discharge Orders    None       Gregary CromerMitchell, Venise Ellingwood B, PA-C 02/28/17 [id number]    Tilden Fossaees, Elizabeth, MD 02/28/17 [phone]

## 2017-02-28 ENCOUNTER — Observation Stay (HOSPITAL_BASED_OUTPATIENT_CLINIC_OR_DEPARTMENT_OTHER): Payer: Medicaid Other

## 2017-02-28 ENCOUNTER — Encounter (HOSPITAL_COMMUNITY): Payer: Self-pay | Admitting: Family Medicine

## 2017-02-28 ENCOUNTER — Emergency Department (HOSPITAL_COMMUNITY): Payer: Medicaid Other

## 2017-02-28 ENCOUNTER — Other Ambulatory Visit: Payer: Self-pay

## 2017-02-28 DIAGNOSIS — I472 Ventricular tachycardia: Secondary | ICD-10-CM | POA: Diagnosis not present

## 2017-02-28 DIAGNOSIS — I2510 Atherosclerotic heart disease of native coronary artery without angina pectoris: Secondary | ICD-10-CM | POA: Diagnosis not present

## 2017-02-28 DIAGNOSIS — Z79899 Other long term (current) drug therapy: Secondary | ICD-10-CM | POA: Diagnosis not present

## 2017-02-28 DIAGNOSIS — E669 Obesity, unspecified: Secondary | ICD-10-CM | POA: Diagnosis present

## 2017-02-28 DIAGNOSIS — I25119 Atherosclerotic heart disease of native coronary artery with unspecified angina pectoris: Secondary | ICD-10-CM | POA: Diagnosis not present

## 2017-02-28 DIAGNOSIS — R011 Cardiac murmur, unspecified: Secondary | ICD-10-CM | POA: Diagnosis present

## 2017-02-28 DIAGNOSIS — I2699 Other pulmonary embolism without acute cor pulmonale: Secondary | ICD-10-CM | POA: Diagnosis present

## 2017-02-28 DIAGNOSIS — I25110 Atherosclerotic heart disease of native coronary artery with unstable angina pectoris: Secondary | ICD-10-CM | POA: Diagnosis not present

## 2017-02-28 DIAGNOSIS — Z91013 Allergy to seafood: Secondary | ICD-10-CM | POA: Diagnosis not present

## 2017-02-28 DIAGNOSIS — Z8249 Family history of ischemic heart disease and other diseases of the circulatory system: Secondary | ICD-10-CM | POA: Diagnosis not present

## 2017-02-28 DIAGNOSIS — I2721 Secondary pulmonary arterial hypertension: Secondary | ICD-10-CM | POA: Diagnosis present

## 2017-02-28 DIAGNOSIS — I255 Ischemic cardiomyopathy: Secondary | ICD-10-CM | POA: Diagnosis present

## 2017-02-28 DIAGNOSIS — I5022 Chronic systolic (congestive) heart failure: Secondary | ICD-10-CM | POA: Diagnosis not present

## 2017-02-28 DIAGNOSIS — Y831 Surgical operation with implant of artificial internal device as the cause of abnormal reaction of the patient, or of later complication, without mention of misadventure at the time of the procedure: Secondary | ICD-10-CM | POA: Diagnosis present

## 2017-02-28 DIAGNOSIS — Z6833 Body mass index (BMI) 33.0-33.9, adult: Secondary | ICD-10-CM | POA: Diagnosis not present

## 2017-02-28 DIAGNOSIS — E7800 Pure hypercholesterolemia, unspecified: Secondary | ICD-10-CM | POA: Diagnosis present

## 2017-02-28 DIAGNOSIS — I5021 Acute systolic (congestive) heart failure: Secondary | ICD-10-CM | POA: Diagnosis not present

## 2017-02-28 DIAGNOSIS — Z7982 Long term (current) use of aspirin: Secondary | ICD-10-CM | POA: Diagnosis not present

## 2017-02-28 DIAGNOSIS — E785 Hyperlipidemia, unspecified: Secondary | ICD-10-CM | POA: Diagnosis present

## 2017-02-28 DIAGNOSIS — Z955 Presence of coronary angioplasty implant and graft: Secondary | ICD-10-CM | POA: Diagnosis not present

## 2017-02-28 DIAGNOSIS — G4733 Obstructive sleep apnea (adult) (pediatric): Secondary | ICD-10-CM | POA: Diagnosis present

## 2017-02-28 DIAGNOSIS — I252 Old myocardial infarction: Secondary | ICD-10-CM | POA: Diagnosis not present

## 2017-02-28 DIAGNOSIS — I340 Nonrheumatic mitral (valve) insufficiency: Secondary | ICD-10-CM | POA: Diagnosis present

## 2017-02-28 DIAGNOSIS — Z9119 Patient's noncompliance with other medical treatment and regimen: Secondary | ICD-10-CM | POA: Diagnosis not present

## 2017-02-28 DIAGNOSIS — I5043 Acute on chronic combined systolic (congestive) and diastolic (congestive) heart failure: Secondary | ICD-10-CM | POA: Diagnosis present

## 2017-02-28 DIAGNOSIS — J45909 Unspecified asthma, uncomplicated: Secondary | ICD-10-CM | POA: Diagnosis present

## 2017-02-28 DIAGNOSIS — T82855A Stenosis of coronary artery stent, initial encounter: Secondary | ICD-10-CM | POA: Diagnosis present

## 2017-02-28 DIAGNOSIS — Z832 Family history of diseases of the blood and blood-forming organs and certain disorders involving the immune mechanism: Secondary | ICD-10-CM | POA: Diagnosis not present

## 2017-02-28 DIAGNOSIS — Z87891 Personal history of nicotine dependence: Secondary | ICD-10-CM | POA: Diagnosis not present

## 2017-02-28 HISTORY — DX: Other pulmonary embolism without acute cor pulmonale: I26.99

## 2017-02-28 LAB — CBC
HEMATOCRIT: 38.5 % — AB (ref 39.0–52.0)
HEMOGLOBIN: 12.1 g/dL — AB (ref 13.0–17.0)
MCH: 28.2 pg (ref 26.0–34.0)
MCHC: 31.4 g/dL (ref 30.0–36.0)
MCV: 89.7 fL (ref 78.0–100.0)
Platelets: 267 10*3/uL (ref 150–400)
RBC: 4.29 MIL/uL (ref 4.22–5.81)
RDW: 13.7 % (ref 11.5–15.5)
WBC: 10.5 10*3/uL (ref 4.0–10.5)

## 2017-02-28 LAB — URINALYSIS, ROUTINE W REFLEX MICROSCOPIC
BILIRUBIN URINE: NEGATIVE
GLUCOSE, UA: NEGATIVE mg/dL
HGB URINE DIPSTICK: NEGATIVE
Ketones, ur: NEGATIVE mg/dL
Leukocytes, UA: NEGATIVE
Nitrite: NEGATIVE
PROTEIN: NEGATIVE mg/dL
Specific Gravity, Urine: >1.046 — ABNORMAL HIGH (ref 1.005–1.030)
pH: 5 (ref 5.0–8.0)

## 2017-02-28 LAB — BASIC METABOLIC PANEL
ANION GAP: 11 (ref 5–15)
BUN: 14 mg/dL (ref 6–20)
CHLORIDE: 107 mmol/L (ref 101–111)
CO2: 21 mmol/L — AB (ref 22–32)
Calcium: 8.9 mg/dL (ref 8.9–10.3)
Creatinine, Ser: 1.33 mg/dL — ABNORMAL HIGH (ref 0.61–1.24)
GFR calc Af Amer: >60 mL/min (ref >60)
GLUCOSE: 114 mg/dL — AB (ref 65–99)
POTASSIUM: 4.2 mmol/L (ref 3.5–5.1)
Sodium: 139 mmol/L (ref 135–145)

## 2017-02-28 LAB — I-STAT TROPONIN, ED: TROPONIN I, POC: 0 ng/mL (ref 0.00–0.08)

## 2017-02-28 LAB — HIV ANTIBODY (ROUTINE TESTING W REFLEX): HIV Screen 4th Generation wRfx: NONREACTIVE

## 2017-02-28 MED ORDER — ACETAMINOPHEN 650 MG RE SUPP: 650.0000 mg | Freq: Four times a day (QID) | RECTAL | Status: DC | PRN

## 2017-02-28 MED ORDER — SODIUM CHLORIDE 0.9 % IV SOLN: 250.0000 mL | INTRAVENOUS | Status: DC | PRN

## 2017-02-28 MED ORDER — LISINOPRIL 10 MG PO TABS
10.0000 mg | ORAL_TABLET | Freq: Every day | ORAL | Status: DC
  Administered 2017-03-01: 10 mg via ORAL
  Filled 2017-02-28 (×2): qty 1

## 2017-02-28 MED ORDER — SENNOSIDES-DOCUSATE SODIUM 8.6-50 MG PO TABS: 1.0000 | ORAL_TABLET | Freq: Every evening | ORAL | Status: DC | PRN

## 2017-02-28 MED ORDER — SODIUM CHLORIDE 0.9% FLUSH: 3.0000 mL | INTRAVENOUS | Status: DC | PRN

## 2017-02-28 MED ORDER — SODIUM CHLORIDE 0.9% FLUSH
3.0000 mL | Freq: Two times a day (BID) | INTRAVENOUS | Status: DC
  Administered 2017-02-28 (×3): 3 mL via INTRAVENOUS

## 2017-02-28 MED ORDER — ENOXAPARIN SODIUM 120 MG/0.8ML ~~LOC~~ SOLN
1.0000 mg/kg | Freq: Once | SUBCUTANEOUS | Status: AC
  Administered 2017-02-28: 105 mg via SUBCUTANEOUS
  Filled 2017-02-28: qty 0.8

## 2017-02-28 MED ORDER — BISACODYL 5 MG PO TBEC: 5.0000 mg | DELAYED_RELEASE_TABLET | Freq: Every day | ORAL | Status: DC | PRN

## 2017-02-28 MED ORDER — FENTANYL CITRATE (PF) 100 MCG/2ML IJ SOLN
100.0000 ug | Freq: Once | INTRAMUSCULAR | Status: AC
  Administered 2017-02-28: 100 ug via INTRAVENOUS
  Filled 2017-02-28: qty 2

## 2017-02-28 MED ORDER — MORPHINE SULFATE (PF) 4 MG/ML IV SOLN: 4.0000 mg | INTRAVENOUS | Status: DC | PRN

## 2017-02-28 MED ORDER — ASPIRIN EC 81 MG PO TBEC
81.0000 mg | DELAYED_RELEASE_TABLET | Freq: Every day | ORAL | Status: DC
  Administered 2017-02-28 – 2017-03-02 (×3): 81 mg via ORAL
  Filled 2017-02-28 (×4): qty 1

## 2017-02-28 MED ORDER — ATORVASTATIN CALCIUM 80 MG PO TABS
80.0000 mg | ORAL_TABLET | Freq: Every day | ORAL | Status: DC
  Administered 2017-02-28 – 2017-03-04 (×5): 80 mg via ORAL
  Filled 2017-02-28 (×6): qty 1

## 2017-02-28 MED ORDER — ONDANSETRON HCL 4 MG PO TABS: 4.0000 mg | ORAL_TABLET | Freq: Four times a day (QID) | ORAL | Status: DC | PRN

## 2017-02-28 MED ORDER — ACETAMINOPHEN 325 MG PO TABS
650.0000 mg | ORAL_TABLET | Freq: Four times a day (QID) | ORAL | Status: DC | PRN
  Administered 2017-02-28: 650 mg via ORAL
  Filled 2017-02-28: qty 2

## 2017-02-28 MED ORDER — ONDANSETRON HCL 4 MG/2ML IJ SOLN: 4.0000 mg | Freq: Four times a day (QID) | INTRAMUSCULAR | Status: DC | PRN

## 2017-02-28 MED ORDER — HEPARIN BOLUS VIA INFUSION
5800.0000 [IU] | Freq: Once | INTRAVENOUS | Status: DC
  Filled 2017-02-28: qty 5800

## 2017-02-28 MED ORDER — SODIUM CHLORIDE 0.9% FLUSH
3.0000 mL | Freq: Two times a day (BID) | INTRAVENOUS | Status: DC
  Administered 2017-02-28 – 2017-03-01 (×4): 3 mL via INTRAVENOUS

## 2017-02-28 MED ORDER — HYDROCODONE-ACETAMINOPHEN 5-325 MG PO TABS
1.0000 | ORAL_TABLET | ORAL | Status: DC | PRN
  Administered 2017-02-28 – 2017-03-02 (×2): 2 via ORAL
  Filled 2017-02-28 (×2): qty 2

## 2017-02-28 MED ORDER — ENOXAPARIN SODIUM 120 MG/0.8ML ~~LOC~~ SOLN
105.0000 mg | Freq: Two times a day (BID) | SUBCUTANEOUS | Status: DC
  Administered 2017-02-28 (×2): 105 mg via SUBCUTANEOUS
  Filled 2017-02-28 (×2): qty 0.8

## 2017-02-28 MED ORDER — HEPARIN (PORCINE) IN NACL 100-0.45 UNIT/ML-% IJ SOLN
1550.0000 [IU]/h | INTRAMUSCULAR | Status: DC
  Filled 2017-02-28: qty 250

## 2017-02-28 NOTE — ED Notes (Signed)
Heart healthy lunch tray with BBQ chicken, steak fries, and salad with french requested @1141 

## 2017-02-28 NOTE — Plan of Care (Signed)
  Pain Managment: General experience of comfort will improve 02/28/2017 2220 - Progressing by Elnita Maxwellodoo, Briena Swingler A, RN   Coping: Level of anxiety will decrease 02/28/2017 2220 - Progressing by Elnita Maxwellodoo, Taylah Dubiel A, RN   Activity: Risk for activity intolerance will decrease 02/28/2017 2220 - Progressing by Elnita Maxwellodoo, Kimora Stankovic A, RN

## 2017-02-28 NOTE — Progress Notes (Signed)
  PROGRESS NOTE  Patient admitted earlier this morning. See H&P. Patient admitted with PE. DVT US negative. Echo pending. CM consulted for DOAC assistance, patient does not have insurance. Continue lovenox BID for now.    Noralee StainJennifer Harless Molinari, DO [facility] [email or web address] Password [email or web address] 02/28/2017, 5:45 PM

## 2017-02-28 NOTE — Progress Notes (Signed)
Patient arrived from ED via bed.  Patient alert and oriented with no complaints of pain.  Patient educated on how to use phone, call bell, and surroundings.

## 2017-02-28 NOTE — ED Notes (Signed)
ED Provider at bedside. 

## 2017-02-28 NOTE — Progress Notes (Signed)
ANTICOAGULATION CONSULT NOTE - Initial Consult  Pharmacy Consult for Lovenox Indication: pulmonary embolus  Allergies  Allergen Reactions  . Shellfish Allergy Anaphylaxis    Patient Measurements: Height: 5\' 11"  (180.3 cm) Weight: 230 lb (104.3 kg) IBW/kg (Calculated) : 75.3  Vital Signs: Temp: 98.3 F (36.8 C) (02/09 1947) Temp Source: Oral (02/09 1947) BP: 119/78 (02/10 0145) Pulse Rate: 89 (02/10 0145)  Labs: Recent Labs    02/27/17 1959  HGB 12.2*  HCT 38.8*  PLT 272  CREATININE 1.32*    Estimated Creatinine Clearance: 91.4 mL/min (A) (by C-G formula based on SCr of 1.32 mg/dL (H)).   Medical History: Past Medical History:  Diagnosis Date  . Asthma   . 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 NO SIGNIFICANT CAD IN THE LEFT MAIN,LAD, OR LEFT CIRCUMFLEX. MILD SEGMENTAL LV DYSFUNCTION WITH OVERALL PRESERVED LVEF. SUCCESSFUL PCI USING A SINGLE BARE-METAL STENT. b. STEMI 10/2015 STENT SYNERGY DES 3X16  to Circ.  Marland Kitchen. Hypercholesteremia   . Tobacco abuse     Assessment: 42yo male c/o epigastric pain and SOB w/ exertion and lying down, CT reveals small pulmonary infarct suspected at the right lung base raising concern for mild mostly resolved pulmonary embolus, to begin LMWH.  Goal of Therapy:  Anti-Xa level 0.6-1 units/ml 4hrs after LMWH dose given Monitor platelets by anticoagulation protocol: Yes   Plan:  Will begin Lovenox 105mg  SQ Q12H and monitor CBC; f/u long-term anticoagulation.  Vernard GamblesVeronda Supriya Beaston, PharmD, BCPS  02/28/2017,2:09 AM

## 2017-02-28 NOTE — Progress Notes (Signed)
LE venous duplex prelim: negative for DVT. Dewitt Judice Eunice, RDMS, RVT  

## 2017-02-28 NOTE — ED Notes (Signed)
Patient transported to Ultrasound 

## 2017-02-28 NOTE — ED Notes (Signed)
Opyd, MD at bedside 

## 2017-02-28 NOTE — H&P (Signed)
History and Physical    Joshua May ZOX:[id number] DOB: October 24, 1975 DOA: 02/27/2017  PCP: Patient, No Pcp Per; confirmed with patient  Patient coming from: Home  Chief Complaint: Chest pain, epigastric pain, DOE, orthopnea  HPI: Joshua May is a 42 y.o. male with medical history significant for hyperlipidemia, coronary artery disease with stents, and nonadherence with his treatment plan, now presenting to the emergency department for evaluation of epigastric and chest pain for several days with concomitant exertional dyspnea.  Patient reports that the symptoms developed insidiously several days ago and person.  Denies fevers or chills, reports a nonproductive cough, and reports some nausea with nonbloody vomiting yesterday.  Chest pain is nonradiating, intermittent, worse with certain movements, and different than the symptoms he experienced with his prior MIs.  Reports using multiple pillows recently due to orthopnea.  Denies any leg swelling or tenderness.  Denies any personal history of DVT or PE, but reports that his dad had a DVT, though the details are uncertain.  ED Course: Upon arrival to the ED, patient is found to be afebrile, saturating well on room air, a sinus rhythm with T wave inversions that are similar.  Chemistry panel is notable for a serum creatinine of 1.32, similar to priors.  CBC features a slight normocytic anemia.  Troponin is negative x2 and decreasing.  D-dimer is elevated to 5.92 and CTA chest reveals a small pulmonary embolism in the right lung base, possibly a resolving PE.  Patient was treated with treatment dose Lovenox and fentanyl in the ED.  He remains hemodynamically stable, is not in any apparent respiratory distress, and will be observed on the telemetry unit for ongoing evaluation and management of pulmonary embolism.  Review of Systems:  All other systems reviewed and apart from HPI, are negative.  Past Medical History:  Diagnosis Date  . Asthma   . CAD  (coronary artery disease)    ACUTE,INFERIOR MI SECONDARY TO RCA OCCLUSION. NO SIGNIFICANT CAD IN THE LEFT MAIN,LAD, OR LEFT CIRCUMFLEX. MILD SEGMENTAL LV DYSFUNCTION WITH OVERALL PRESERVED LVEF. SUCCESSFUL PCI USING A SINGLE BARE-METAL STENT. b. STEMI 10/2015 STENT SYNERGY DES 3X16  to Circ.  Marland Kitchen Hypercholesteremia   . Tobacco abuse     Past Surgical History:  Procedure Laterality Date  . CARDIAC CATHETERIZATION N/A 10/20/2015   Procedure: Left Heart Cath and Coronary Angiography;  Surgeon: Kathleene Hazel, MD;  Location: [facility] INVASIVE CV LAB;  Service: Cardiovascular;  Laterality: N/A;  . CARDIAC CATHETERIZATION N/A 10/20/2015   Procedure: Coronary Stent Intervention;  Surgeon: Kathleene Hazel, MD;  Location: [facility] INVASIVE CV LAB;  Service: Cardiovascular;  Laterality: N/A;  . none    . OTHER SURGICAL HISTORY     NONE     reports that he has quit smoking. His smoking use included cigarettes. He has a 8.50 pack-year smoking history. he has never used smokeless tobacco. He reports that he drinks about 4.8 oz of alcohol per week. He reports that he uses drugs. Drug: Marijuana.  Allergies  Allergen Reactions  . Shellfish Allergy Anaphylaxis    Family History  Problem Relation Age of Onset  . Other Sister 15       THE PATIENT REPORTS A SISTER WITH A MYOCARDIAL INFARCTION IN HER 30s.(SHE IS NOW S/P CABG)  . Heart attack Father   . Deep vein thrombosis Father      Prior to Admission medications   Medication Sig Start Date End Date Taking? Authorizing Provider  aspirin EC 81  MG tablet Take 1 tablet (81 mg total) by mouth daily. 10/22/15  Yes Little IshikawaSmith, Erin E, NP  nitroGLYCERIN (NITROSTAT) 0.4 MG SL tablet Place 1 tablet (0.4 mg total) under the tongue every 5 (five) minutes as needed for chest pain. 10/22/15  Yes Little IshikawaSmith, Erin E, NP  atorvastatin (LIPITOR) 80 MG tablet Take 1 tablet (80 mg total) by mouth daily. Patient not taking: Reported on 02/27/2017 11/01/15   Azalee CourseMeng, Hao, PA    lisinopril (PRINIVIL,ZESTRIL) 10 MG tablet Take 1 tablet (10 mg total) by mouth daily. Patient not taking: Reported on 02/27/2017 11/01/15   Azalee CourseMeng, Hao, PA  metoprolol tartrate (LOPRESSOR) 25 MG tablet Take 1.5 tablets (37.5 mg total) by mouth 2 (two) times daily. Patient not taking: Reported on 02/27/2017 12/24/15   Azalee CourseMeng, Hao, PA  ticagrelor (BRILINTA) 90 MG TABS tablet Take 1 tablet (90 mg total) by mouth 2 (two) times daily. Patient not taking: Reported on 02/27/2017 10/22/15   Little IshikawaSmith, Erin E, NP    Physical Exam: Vitals:   02/28/17 0030 02/28/17 0100 02/28/17 0115 02/28/17 0145  BP: (!) 125/104 (!) 122/94 (!) 133/101 119/78  Pulse: 97 96 98 89  Resp: (!) 22 (!) 21 16 18   Temp:      TempSrc:      SpO2: 100% 100% 97% 100%  Weight:      Height:          Constitutional: NAD, calm, comfortable Eyes: PERTLA, lids and conjunctivae normal ENMT: Mucous membranes are moist. Posterior pharynx clear of any exudate or lesions.   Neck: normal, supple, no masses, no thyromegaly Respiratory: clear to auscultation bilaterally, no wheezing, no crackles. Normal respiratory effort.  Cardiovascular: S1 & S2 heard, regular rate and rhythm. No extremity edema. No significant JVD. Abdomen: No distension, no tenderness, no masses palpated. Bowel sounds normal.  Musculoskeletal: no clubbing / cyanosis. No joint deformity upper and lower extremities. Normal muscle tone.  Skin: no significant rashes, lesions, ulcers. Warm, dry, well-perfused. Neurologic: CN 2-12 grossly intact. Sensation intact. Strength 5/5 in all 4 limbs.  Psychiatric: Alert and oriented x 3. Calm, cooperative.     Labs on Admission: I have personally reviewed following labs and imaging studies  CBC: Recent Labs  Lab 02/27/17 1959  WBC 8.9  HGB 12.2*  HCT 38.8*  MCV 89.6  PLT 272   Basic Metabolic Panel: Recent Labs  Lab 02/27/17 1959  NA 140  K 4.2  CL 109  CO2 22  GLUCOSE 96  BUN 14  CREATININE 1.32*  CALCIUM 9.0    GFR: Estimated Creatinine Clearance: 91.4 mL/min (A) (by C-G formula based on SCr of 1.32 mg/dL (H)). Liver Function Tests: No results for input(s): AST, ALT, ALKPHOS, BILITOT, PROT, ALBUMIN in the last 168 hours. Recent Labs  Lab 02/27/17 1959  LIPASE 27   No results for input(s): AMMONIA in the last 168 hours. Coagulation Profile: No results for input(s): INR, PROTIME in the last 168 hours. Cardiac Enzymes: No results for input(s): CKTOTAL, CKMB, CKMBINDEX, TROPONINI in the last 168 hours. BNP (last 3 results) No results for input(s): PROBNP in the last 8760 hours. HbA1C: No results for input(s): HGBA1C in the last 72 hours. CBG: No results for input(s): GLUCAP in the last 168 hours. Lipid Profile: No results for input(s): CHOL, HDL, LDLCALC, TRIG, CHOLHDL, LDLDIRECT in the last 72 hours. Thyroid Function Tests: No results for input(s): TSH, T4TOTAL, FREET4, T3FREE, THYROIDAB in the last 72 hours. Anemia Panel: No results for input(s): VITAMINB12,  FOLATE, FERRITIN, TIBC, IRON, RETICCTPCT in the last 72 hours. Urine analysis: No results found for: COLORURINE, APPEARANCEUR, LABSPEC, PHURINE, GLUCOSEU, HGBUR, BILIRUBINUR, KETONESUR, PROTEINUR, UROBILINOGEN, NITRITE, LEUKOCYTESUR Sepsis Labs: @LABRCNTIP (procalcitonin:4,lacticidven:4) )No results found for this or any previous visit (from the past 240 hour(s)).   Radiological Exams on Admission: Dg Chest 2 View  Result Date: 02/27/2017 CLINICAL DATA:  Epigastric pain for 3 days, shortness of breath when lying down and with exertion for 2 weeks, nausea, vomiting, diarrhea abdominal swelling, history cardiac disease (coronary artery disease post MI and coronary stenting), asthma, former smoker EXAM: CHEST  2 VIEW COMPARISON:  03/31/2008 FINDINGS: Enlargement of cardiac silhouette. Mediastinal contours and pulmonary vascularity normal. Minimal chronic peribronchial thickening. Tiny RIGHT pleural effusion. No acute infiltrate, LEFT  pleural effusion or pneumothorax. Bones unremarkable. IMPRESSION: Enlargement of cardiac silhouette. Minimal chronic bronchitic changes and tiny RIGHT pleural effusion without acute infiltrate. Electronically Signed   By: Ulyses Southward M.D.   On: 02/27/2017 20:17   Ct Angio Chest Pe W And/or Wo Contrast  Result Date: 02/28/2017 CLINICAL DATA:  Acute onset of epigastric abdominal pain. Shortness of breath with exertion and when lying down. Elevated D-dimer. EXAM: CT ANGIOGRAPHY CHEST WITH CONTRAST TECHNIQUE: Multidetector CT imaging of the chest was performed using the standard protocol during bolus administration of intravenous contrast. Multiplanar CT image reconstructions and MIPs were obtained to evaluate the vascular anatomy. CONTRAST:  ISOVUE-370 IOPAMIDOL (ISOVUE-370) INJECTION 76% COMPARISON:  Chest radiograph performed 02/27/2017 FINDINGS: Cardiovascular: A small pulmonary infarct is suspected at the right lung base, raising concern for mild mostly resolved pulmonary embolus. No central pulmonary embolus is identified. The heart is borderline enlarged. The thoracic aorta is not well assessed but appears grossly unremarkable. The great vessels are grossly unremarkable in appearance. Mediastinum/Nodes: No mediastinal lymphadenopathy is seen. No pericardial effusion is identified. The visualized portions of the thyroid gland are unremarkable. No axillary lymphadenopathy is appreciated. Lungs/Pleura: A small right pleural effusion is noted. The hazy wedge-shaped opacity at the right lung base is suspicious for pulmonary infarct. No pneumothorax is seen. The left lung appears clear. No masses are identified. Upper Abdomen: The visualized portions of the liver and spleen are unremarkable. There is reflux of contrast into the hepatic veins and IVC. Musculoskeletal: No acute osseous abnormalities are identified. The visualized musculature is unremarkable in appearance. Review of the MIP images confirms the  above findings. IMPRESSION: 1. Small pulmonary infarct suspected at the right lung base, raising concern for mild mostly resolved pulmonary embolus. 2. Associated small right pleural effusion noted. 3. Borderline cardiomegaly. Critical Value/emergent results were called by telephone at the time of interpretation on 02/28/2017 at 12:44 am to Dr. Wilkie Aye, who verbally acknowledged these results. Electronically Signed   By: Roanna Raider M.D.   On: 02/28/2017 00:47    EKG: Independently reviewed. Sinus rhythm, T-wave inversions similar to prior.   Assessment/Plan  1. Pulmonary embolism - Presents with DOE and pain in epigastrium and chest for several days  - ED work-up with elevated d-dimer and small right-sided PE on CTA chest  - He was started on treatment-dose Lovenox  - Denies leg swelling, leg tenderness, prolonged immobilization, of prior DVT/PE - He reports FHx of DVT in his father but details uncertain  - Troponin has been negative x2 and decreasing  - Continue anticoagulation, check LE venous dopplers, check echo    2. CAD  - Complains of chest and epigastric pain for the past 3 days  - Troponin is negative  x2 and decreasing, EKG with T-wave inversions similar to prior, d-dimer elevated and small PE detected on CTA  - Started on treatment-dose Lovenox for PE  - Resume ASA, statin, and ACE-i, hold beta-blocker in light of PE   3. Chronic combined systolic/diastolic CHF  - EF 40-45% with diffuse HK, mild MR, and grade 2 diastolic dysfunction on echo from October 2017 after NSTEMI  - Appears to be well-compensated  - Given the worsening DOE, and small PE, will update echo   - Resume ACE, hold beta-blocker for now in light of PE   DVT prophylaxis: Treatment-dose Lovenox  Code Status: Full  Family Communication: Family updated at bedside Disposition Plan: Observe on telemetry Consults called: None Admission status: Observation    Briscoe Deutscher, MD [facility] Pager  [phone]  If 7PM-7AM, please contact night-coverage [email or web address] Password [facility]  02/28/2017, 2:33 AM

## 2017-02-28 NOTE — ED Notes (Signed)
Patient's ED bed changed to hospital bed.

## 2017-02-28 NOTE — ED Notes (Signed)
Breakfast tray ordered 

## 2017-02-28 NOTE — ED Notes (Signed)
Pharmacy messaged about lovenox.

## 2017-02-28 NOTE — ED Notes (Signed)
Nurse will draw labs. 

## 2017-03-01 ENCOUNTER — Inpatient Hospital Stay (HOSPITAL_COMMUNITY): Payer: Medicaid Other

## 2017-03-01 DIAGNOSIS — I2721 Secondary pulmonary arterial hypertension: Secondary | ICD-10-CM

## 2017-03-01 DIAGNOSIS — I340 Nonrheumatic mitral (valve) insufficiency: Secondary | ICD-10-CM

## 2017-03-01 DIAGNOSIS — I5021 Acute systolic (congestive) heart failure: Secondary | ICD-10-CM

## 2017-03-01 DIAGNOSIS — I25110 Atherosclerotic heart disease of native coronary artery with unstable angina pectoris: Secondary | ICD-10-CM

## 2017-03-01 LAB — BASIC METABOLIC PANEL
ANION GAP: 10 (ref 5–15)
BUN: 16 mg/dL (ref 6–20)
CALCIUM: 9 mg/dL (ref 8.9–10.3)
CO2: 24 mmol/L (ref 22–32)
Chloride: 106 mmol/L (ref 101–111)
Creatinine, Ser: 1.34 mg/dL — ABNORMAL HIGH (ref 0.61–1.24)
GFR calc Af Amer: >60 mL/min (ref >60)
GLUCOSE: 96 mg/dL (ref 65–99)
POTASSIUM: 4.7 mmol/L (ref 3.5–5.1)
SODIUM: 140 mmol/L (ref 135–145)

## 2017-03-01 LAB — CBC
HCT: 39 % (ref 39.0–52.0)
Hemoglobin: 12.2 g/dL — ABNORMAL LOW (ref 13.0–17.0)
MCH: 28.2 pg (ref 26.0–34.0)
MCHC: 31.3 g/dL (ref 30.0–36.0)
MCV: 90.1 fL (ref 78.0–100.0)
PLATELETS: 291 10*3/uL (ref 150–400)
RBC: 4.33 MIL/uL (ref 4.22–5.81)
RDW: 13.9 % (ref 11.5–15.5)
WBC: 10.4 10*3/uL (ref 4.0–10.5)

## 2017-03-01 LAB — TROPONIN I

## 2017-03-01 LAB — ECHOCARDIOGRAM COMPLETE
HEIGHTINCHES: 71 in
WEIGHTICAEL: 3790.4 [oz_av]

## 2017-03-01 MED ORDER — RIVAROXABAN (XARELTO) EDUCATION KIT FOR DVT/PE PATIENTS
PACK | Freq: Once | Status: AC
  Administered 2017-03-01: 14:00:00
  Filled 2017-03-01 (×2): qty 1

## 2017-03-01 MED ORDER — PERFLUTREN LIPID MICROSPHERE
INTRAVENOUS | Status: AC
  Administered 2017-03-01: 5 mL via INTRAVENOUS
  Filled 2017-03-01: qty 10

## 2017-03-01 MED ORDER — PERFLUTREN LIPID MICROSPHERE
1.0000 mL | INTRAVENOUS | Status: AC | PRN
  Administered 2017-03-01: 5 mL via INTRAVENOUS
  Filled 2017-03-01: qty 10

## 2017-03-01 MED ORDER — NITROGLYCERIN 0.4 MG SL SUBL
SUBLINGUAL_TABLET | SUBLINGUAL | Status: AC
  Administered 2017-03-01: 0.4 mg
  Filled 2017-03-01: qty 1

## 2017-03-01 MED ORDER — RIVAROXABAN 15 MG PO TABS
15.0000 mg | ORAL_TABLET | Freq: Two times a day (BID) | ORAL | Status: DC
  Administered 2017-03-01: 15 mg via ORAL
  Filled 2017-03-01: qty 1

## 2017-03-01 MED ORDER — HEPARIN (PORCINE) IN NACL 100-0.45 UNIT/ML-% IJ SOLN
1450.0000 [IU]/h | INTRAMUSCULAR | Status: DC
  Administered 2017-03-02: 1500 [IU]/h via INTRAVENOUS
  Administered 2017-03-02 (×2): 1600 [IU]/h via INTRAVENOUS
  Filled 2017-03-01 (×2): qty 250

## 2017-03-01 MED ORDER — RIVAROXABAN 20 MG PO TABS: 20.0000 mg | ORAL_TABLET | Freq: Every day | ORAL | Status: DC

## 2017-03-01 NOTE — Progress Notes (Signed)
RN called to the room by pt wife Pt wife states pt is having shortness of breath  Asked pt if he is having shortness of breath, pt stated yes Pt also states he just woke up  Pt has history of sleep apnea  Family states he has not been diagnosed and does not wear a CPAP  Placed pt on 2L nasal canula, pt states he feels better   MD called RN  MD aware of pt shortness of breath and placed on nasal canula  MD talked to pt and pt wife via phone regarding 2D echo results and plan of care

## 2017-03-01 NOTE — Progress Notes (Signed)
Echocardiogram 2D Echocardiogram with definity has been performed.  [name], Joshua May 03/01/2017, 11:25 AM

## 2017-03-01 NOTE — Progress Notes (Signed)
Pt having chest pian 7/10. Pt describes it as throbbing, mid sternal. Vital signs documented. EKG completed and placed in chart. One nitro SL given. Pt states chest pain relieved 3/10. Paged MD. MD aware of pt chest pain, nitro given. MD states she saw EKG digitally, no change from yesterday. MD stated to draw troponins and await 2D echo. Education provided to pt and pt family at bedside.  Pt resting in bed at this time. Will continue to monitor

## 2017-03-01 NOTE — Care Management Note (Signed)
Case Management Note  Patient Details  Name: Joshua May MRN: [id number] Date of Birth: 07-19-1975  Subjective/Objective:   PE                Action/Plan: Patient lives at home with spouse; no PCP/ no medical insurance; [facility]; Xarelto coupon card given to the patient with explanation of usage; CM encouraged pt/ spouse to call the Xarelto medication assistance program for ongoing support; They plan to go to [employer] to complete paperwork to see if he will qualify for Medicaid Assistance.  Expected Discharge Date:    possibly 03/01/2017              Expected Discharge Plan:   Home/ self  Status of Service:   In progress  Reola MosherChandler, Reeve Turnley L, RN,MHA,BSN [phone] 03/01/2017, 10:38 AM

## 2017-03-01 NOTE — Progress Notes (Signed)
PROGRESS NOTE    Joshua PoreKaleff H Causer  VHQ:[id number] DOB: 09/17/1975 DOA: 02/27/2017 PCP: Patient, No Pcp Per     Brief Narrative:  Joshua May is a 42 y.o. male with medical history significant for hyperlipidemia, coronary artery disease with stents, and nonadherence with DAPT, now presenting to the emergency department for evaluation of epigastric and chest pain for several days with concomitant exertional dyspnea. Patient reports that the symptoms developed insidiously several days. 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 Denies any personal history of DVT or PE, but reports that his dad had a DVT. In the ED, CTA chest confirmed PE. He was started on lovenox and transitioned to Xarelto. Echocardiogram was completed which revealed worsening EF 25% with diffuse hypokinesis.   Assessment & Plan:   Principal Problem:   Pulmonary embolism (HCC) Active Problems:   HLD (hyperlipidemia)   CAD (coronary artery disease)  Pulmonary embolism -LE doppler negative for DVT -Xarelto  Chest pain with hx of CAD and ICM  -Complained of central chest pain this morning. EKG completed and personally reviewed which revealed NSR with T wave inversion in lateral leads, similar to previous. Troponin obtained negative.  -Echocardiogram with new EF 25-30% with diffuse hypokinesis and inferolateral akinesis -He had previously undergone BMS in 2010 and DES in October 2017. He was lost to follow up due to insurance issues and stopped Brilinta about 7 months ago along with statin, beta blocker, ACE inhibitor. He is currently on baby aspirin only -Cardiology consult  HLD -Restart statin   ?OSA -Need sleep study as outpatient    DVT prophylaxis: Xarelto Code Status: Full Family Communication: At bedside Disposition Plan: Pending stabilization   Consultants:    Cardiology  Procedures:   None   Antimicrobials:  Anti-infectives (From admission, onward)   None       Subjective: On my initial examination this morning, patient is complaining of right-sided chest pain that was pleuritic in nature, worsening with deep breaths, cough, positional changes.  He had no complaints of shortness of breath at the time.  I was then later called by RN regarding substernal chest pain, throbbing in nature, 7 out of 10.  He did receive nitro with relief to 3 out of 10.   I called patient this afternoon regarding echocardiogram findings.  He had just complained of shortness of breath to RN.  Per wife, patient had been sleeping for about 3 hours and woke up with shortness of breath, gasping for air.  He was placed on 2 L nasal cannula with improvement in his symptoms.  Currently, he is chest pain-free.  Objective: Vitals:   03/01/17 1139 03/01/17 1209 03/01/17 1455 03/01/17 1456  BP: 125/62 125/62    Pulse: (!) 101 (!) 101 98 100  Resp: 18 18 18 18   Temp: 98.2 F (36.8 C) 98.2 F (36.8 C)    TempSrc: Oral Oral    SpO2: 100% 100% 100% 100%  Weight:      Height:        Intake/Output Summary (Last 24 hours) at 03/01/2017 1510 Last data filed at 03/01/2017 1440 Gross per 24 hour  Intake -  Output 525 ml  Net -525 ml   Filed Weights   02/27/17 1950 02/28/17 1536 03/01/17 0645  Weight: 104.3 kg (230 lb) 107 kg (236 lb) 107.5 kg (236 lb 14.4 oz)    Examination:  General  exam: Appears calm and comfortable  Respiratory system: Clear to auscultation. Respiratory effort normal. Cardiovascular system: S1 & S2 heard, RRR. No JVD, murmurs, rubs, gallops or clicks. No pedal edema. Gastrointestinal system: Abdomen is nondistended, soft and nontender. No organomegaly or masses felt. Normal bowel sounds heard. Central nervous system: Alert and oriented. No focal neurological deficits. Extremities: Symmetric 5 x 5 power. Skin: No rashes, lesions or  ulcers Psychiatry: Judgement and insight appear normal. Mood & affect appropriate.   Data Reviewed: I have personally reviewed following labs and imaging studies  CBC: Recent Labs  Lab 02/27/17 1959 02/28/17 0234 03/01/17 0605  WBC 8.9 10.5 10.4  HGB 12.2* 12.1* 12.2*  HCT 38.8* 38.5* 39.0  MCV 89.6 89.7 90.1  PLT 272 267 291   Basic Metabolic Panel: Recent Labs  Lab 02/27/17 1959 02/28/17 0234 03/01/17 0605  NA 140 139 140  K 4.2 4.2 4.7  CL 109 107 106  CO2 22 21* 24  GLUCOSE 96 114* 96  BUN 14 14 16   CREATININE 1.32* 1.33* 1.34*  CALCIUM 9.0 8.9 9.0   GFR: Estimated Creatinine Clearance: 91.4 mL/min (A) (by C-G formula based on SCr of 1.34 mg/dL (H)). Liver Function Tests: No results for input(s): AST, ALT, ALKPHOS, BILITOT, PROT, ALBUMIN in the last 168 hours. Recent Labs  Lab 02/27/17 1959  LIPASE 27   No results for input(s): AMMONIA in the last 168 hours. Coagulation Profile: No results for input(s): INR, PROTIME in the last 168 hours. Cardiac Enzymes: Recent Labs  Lab 03/01/17 0848  TROPONINI <0.03   BNP (last 3 results) No results for input(s): PROBNP in the last 8760 hours. HbA1C: No results for input(s): HGBA1C in the last 72 hours. CBG: No results for input(s): GLUCAP in the last 168 hours. Lipid Profile: No results for input(s): CHOL, HDL, LDLCALC, TRIG, CHOLHDL, LDLDIRECT in the last 72 hours. Thyroid Function Tests: No results for input(s): TSH, T4TOTAL, FREET4, T3FREE, THYROIDAB in the last 72 hours. Anemia Panel: No results for input(s): VITAMINB12, FOLATE, FERRITIN, TIBC, IRON, RETICCTPCT in the last 72 hours. Sepsis Labs: No results for input(s): PROCALCITON, LATICACIDVEN in the last 168 hours.  No results found for this or any previous visit (from the past 240 hour(s)).     Radiology Studies: Dg Chest 2 View  Result Date: 02/27/2017 CLINICAL DATA:  Epigastric pain for 3 days, shortness of breath when lying down and with  exertion for 2 weeks, nausea, vomiting, diarrhea abdominal swelling, history cardiac disease (coronary artery disease post MI and coronary stenting), asthma, former smoker EXAM: CHEST  2 VIEW COMPARISON:  03/31/2008 FINDINGS: Enlargement of cardiac silhouette. Mediastinal contours and pulmonary vascularity normal. Minimal chronic peribronchial thickening. Tiny RIGHT pleural effusion. No acute infiltrate, LEFT pleural effusion or pneumothorax. Bones unremarkable. IMPRESSION: Enlargement of cardiac silhouette. Minimal chronic bronchitic changes and tiny RIGHT pleural effusion without acute infiltrate. Electronically Signed   By: Ulyses Southward M.D.   On: 02/27/2017 20:17   Ct Angio Chest Pe W And/or Wo Contrast  Result Date: 02/28/2017 CLINICAL DATA:  Acute onset of epigastric abdominal pain. Shortness of breath with exertion and when lying down. Elevated D-dimer. EXAM: CT ANGIOGRAPHY CHEST WITH CONTRAST TECHNIQUE: Multidetector CT imaging of the chest was performed using the standard protocol during bolus administration of intravenous contrast. Multiplanar CT image reconstructions and MIPs were obtained to evaluate the vascular anatomy. CONTRAST:  ISOVUE-370 IOPAMIDOL (ISOVUE-370) INJECTION 76% COMPARISON:  Chest radiograph performed 02/27/2017 FINDINGS: Cardiovascular: A small pulmonary infarct is  suspected at the right lung base, raising concern for mild mostly resolved pulmonary embolus. No central pulmonary embolus is identified. The heart is borderline enlarged. The thoracic aorta is not well assessed but appears grossly unremarkable. The great vessels are grossly unremarkable in appearance. Mediastinum/Nodes: No mediastinal lymphadenopathy is seen. No pericardial effusion is identified. The visualized portions of the thyroid gland are unremarkable. No axillary lymphadenopathy is appreciated. Lungs/Pleura: A small right pleural effusion is noted. The hazy wedge-shaped opacity at the right lung base is  suspicious for pulmonary infarct. No pneumothorax is seen. The left lung appears clear. No masses are identified. Upper Abdomen: The visualized portions of the liver and spleen are unremarkable. There is reflux of contrast into the hepatic veins and IVC. Musculoskeletal: No acute osseous abnormalities are identified. The visualized musculature is unremarkable in appearance. Review of the MIP images confirms the above findings. IMPRESSION: 1. Small pulmonary infarct suspected at the right lung base, raising concern for mild mostly resolved pulmonary embolus. 2. Associated small right pleural effusion noted. 3. Borderline cardiomegaly. Critical Value/emergent results were called by telephone at the time of interpretation on 02/28/2017 at 12:44 am to Dr. Wilkie Aye, who verbally acknowledged these results. Electronically Signed   By: Roanna Raider M.D.   On: 02/28/2017 00:47   Vas [place] Lower Extremity Venous (dvt)  Result Date: 02/28/2017  Lower Venous Study Risk Factors: Confirmed PE. Examination Guidelines: A complete evaluation includes B-mode imaging, spectral doppler, color doppler, and power doppler as needed of all accessible portions of each vessel. Bilateral testing is considered an integral part of a complete examination. Limited examinations for reoccurring indications may be performed as noted. The reflux portion of the exam is performed with the patient in reverse Trendelenburg.  Right Venous Findings: +---------+---------------+---------+-----------+----------+-------+          CompressibilityPhasicitySpontaneityPropertiesSummary +---------+---------------+---------+-----------+----------+-------+ CFV      Full           Yes      Yes                          +---------+---------------+---------+-----------+----------+-------+ FV Prox  Full                                                 +---------+---------------+---------+-----------+----------+-------+ FV Mid   Full                                                  +---------+---------------+---------+-----------+----------+-------+ FV DistalFull                                                 +---------+---------------+---------+-----------+----------+-------+ PFV      Full                                                 +---------+---------------+---------+-----------+----------+-------+ POP      Full           Yes  Yes                          +---------+---------------+---------+-----------+----------+-------+ PTV      Full                                                 +---------+---------------+---------+-----------+----------+-------+ PERO     Full                                                 +---------+---------------+---------+-----------+----------+-------+  Left Venous Findings: +---------+---------------+---------+-----------+----------+-------+          CompressibilityPhasicitySpontaneityPropertiesSummary +---------+---------------+---------+-----------+----------+-------+ CFV      Full           Yes      Yes                          +---------+---------------+---------+-----------+----------+-------+ FV Prox  Full                                                 +---------+---------------+---------+-----------+----------+-------+ FV Mid   Full                                                 +---------+---------------+---------+-----------+----------+-------+ FV DistalFull                                                 +---------+---------------+---------+-----------+----------+-------+ PFV      Full                                                 +---------+---------------+---------+-----------+----------+-------+ POP      Full           Yes      Yes                          +---------+---------------+---------+-----------+----------+-------+ PTV      Full                                                  +---------+---------------+---------+-----------+----------+-------+ PERO     Full                                                 +---------+---------------+---------+-----------+----------+-------+    Final Interpretation: Right: There is no evidence of deep vein thrombosis in the lower extremity. No cystic  structure found in the popliteal fossa. Left: There is no evidence of deep vein thrombosis in the lower extremity. No cystic structure found in the popliteal fossa.  *See table(s) above for measurements and observations. Electronically signed by Lemar Livings on 02/28/2017 at 2:00:06 PM.  Final     Scheduled Meds: . aspirin EC  81 mg Oral Daily  . atorvastatin  80 mg Oral q1800  . rivaroxaban  15 mg Oral BID   And  . [START ON 03/22/2017] rivaroxaban  20 mg Oral Daily  . sodium chloride flush  3 mL Intravenous Q12H  . sodium chloride flush  3 mL Intravenous Q12H   Continuous Infusions: . sodium chloride       LOS: 1 day    Time spent: 30 minutes   Noralee Stain, DO [facility] [email or web address] Password [email or web address] 03/01/2017, 3:10 PM

## 2017-03-01 NOTE — Progress Notes (Signed)
ANTICOAGULATION CONSULT NOTE   Pharmacy Consult for Lovenox > change to Xarelto Indication: pulmonary embolus  Allergies  Allergen Reactions  . Shellfish Allergy Anaphylaxis    Patient Measurements: Height: 5\' 11"  (180.3 cm) Weight: 236 lb 14.4 oz (107.5 kg) IBW/kg (Calculated) : 75.3  Vital Signs: Temp: 98.4 F (36.9 C) (02/11 0645) Temp Source: Oral (02/11 0645) BP: 114/70 (02/11 0852) Pulse Rate: 91 (02/11 0852)  Labs: Recent Labs    02/27/17 1959 02/28/17 0234 03/01/17 0605  HGB 12.2* 12.1* 12.2*  HCT 38.8* 38.5* 39.0  PLT 272 267 291  CREATININE 1.32* 1.33* 1.34*    Estimated Creatinine Clearance: 91.4 mL/min (A) (by C-G formula based on SCr of 1.34 mg/dL (H)).   Medical History: Past Medical History:  Diagnosis Date  . Asthma   . 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 NO SIGNIFICANT CAD IN THE LEFT MAIN,LAD, OR LEFT CIRCUMFLEX. MILD SEGMENTAL LV DYSFUNCTION WITH OVERALL PRESERVED LVEF. SUCCESSFUL PCI USING A SINGLE BARE-METAL STENT. b. STEMI 10/2015 STENT SYNERGY DES 3X16  to Circ.  Marland Kitchen. Hypercholesteremia   . Tobacco abuse     Assessment: 42yo male c/o epigastric pain and SOB w/ exertion and lying down, CT reveals small pulmonary infarct suspected at the right lung base raising concern for mild mostly resolved pulmonary embolus, started on Lovenox.  Pharmacy asked to change to Xarelto tonight.  Goal of Therapy:  Anti-Xa level 0.6-1 units/ml 4hrs after LMWH dose given Monitor platelets by anticoagulation protocol: Yes   Plan:  D/c Lovenox Start Xarelto 15 mg BID x 21 days, followed by 20 mg daily. Will educate patient prior to d/c.  Tad MooreJessica Destina Mantei, Pharm D, BCPS  Clinical Pharmacist Pager [phone]  03/01/2017 10:11 AM

## 2017-03-01 NOTE — Progress Notes (Signed)
ANTICOAGULATION CONSULT NOTE - Follow Up Consult  Pharmacy Consult:  Xarelto >> IV heparin Indication: pulmonary embolus  Allergies  Allergen Reactions  . Shellfish Allergy Anaphylaxis    Patient Measurements: Height: 5\' 11"  (180.3 cm) Weight: 236 lb 14.4 oz (107.5 kg) IBW/kg (Calculated) : 75.3 Heparin Dosing Weight: 98 kg  Vital Signs: Temp: 98.2 F (36.8 C) (02/11 1209) Temp Source: Oral (02/11 1209) BP: 125/62 (02/11 1209) Pulse Rate: 100 (02/11 1456)  Labs: Recent Labs    02/27/17 1959 02/28/17 0234 03/01/17 0605 03/01/17 0848  HGB 12.2* 12.1* 12.2*  --   HCT 38.8* 38.5* 39.0  --   PLT 272 267 291  --   CREATININE 1.32* 1.33* 1.34*  --   TROPONINI  --   --   --  <0.03    Estimated Creatinine Clearance: 91.4 mL/min (A) (by C-G formula based on SCr of 1.34 mg/dL (H)).     Assessment: 41 YOM with complaint of epigastric pain and SOB with exertion and lying down.  CT revealed small pulmonary infarct suspected at the right lung base raising concern for mild mostly resolved pulmonary embolus.  Patient was transitioned to Xarelto from Lovenox today and he received Xarelto around 1130.  Cards plan for cath to rule out occluded CFx and Pharmacy consulted to transition patient to IV heparin.  BMET and CBC stable; no bleeding reported.   Goal of Therapy:  Heparin level 0.3-0.7 units/ml aPTT 66 - 102 seconds Monitor platelets by anticoagulation protocol: Yes    Plan:  At 2345, start heparin gtt at 1600 units/hr, no bolus Check 6 hr aPTT and heparin level Daily heparin level, aPTT and CBC   Joshua May D. Laney Potashang, PharmD, BCPS Pager:  [phone] 03/01/2017, 4:30 PM

## 2017-03-01 NOTE — Consult Note (Signed)
Cardiology Consultation:   Patient ID: Joshua May; [id number]; 1975/05/20   Admit date: 02/27/2017 Date of Consult: 03/01/2017  Primary Care Provider: Patient, No Pcp Per Primary Cardiologist: Dr Antoine Poche   Patient Profile:   Joshua May is a 42 y.o.AA male with a hx of CAD who is being seen today for the evaluation of new LVD at the request of Dr Alvino Chapel.  History of Present Illness:   Joshua May is an unemployed 42 y/o AA male with a history of CAD dating back to 2007 when he had a PCI in [place]. In 2010 he presented with a STEMI and had an RCA DES placed. He followed up for a year but then didn't present again till Oct 2017 when he presented with a CFX infarct. Cath revealed 50% ISR of the RCA, total mCFX and 85% OM3. He had CFX PCI with DES and OM3 POBA. His EF then was 50-55%.   He did well for 6 -7 months but then ran out of his medications. He presents now with gradual onset increasing DOE. Work up so far has shown negative Troponin. His D-dimer was elevated and a chest CTA showed a small RLL PE. Echo shows new LVD with an EF of 25-30% with moderate to severe MR.    Past Medical History:  Diagnosis Date  . Asthma   . 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 NO SIGNIFICANT CAD IN THE LEFT MAIN,LAD, OR LEFT CIRCUMFLEX. MILD SEGMENTAL LV DYSFUNCTION WITH OVERALL PRESERVED LVEF. SUCCESSFUL PCI USING A SINGLE BARE-METAL STENT. b. STEMI 10/2015 STENT SYNERGY DES 3X16  to Circ.  Marland Kitchen Hypercholesteremia   . Tobacco abuse    Home medications- none   Inpatient Medications: Scheduled Meds: . aspirin EC  81 mg Oral Daily  . atorvastatin  80 mg Oral q1800  . rivaroxaban  15 mg Oral BID   And  . [START ON 03/22/2017] rivaroxaban  20 mg Oral Daily  . sodium chloride flush  3 mL Intravenous Q12H  . sodium chloride flush  3 mL Intravenous Q12H   Continuous Infusions: . sodium chloride     PRN Meds: sodium chloride, acetaminophen **OR** acetaminophen,  bisacodyl, HYDROcodone-acetaminophen, morphine injection, ondansetron **OR** ondansetron (ZOFRAN) IV, senna-docusate, sodium chloride flush  Allergies:    Allergies  Allergen Reactions  . Shellfish Allergy Anaphylaxis    Social History:   Social History   Socioeconomic History  . Marital status: Married    Spouse name: Not on file  . Number of children: Not on file  . Years of education: Not on file  . Highest education level: Not on file  Social Needs  . Financial resource strain: Not on file  . Food insecurity - worry: Not on file  . Food insecurity - inability: Not on file  . Transportation needs - medical: Not on file  . Transportation needs - non-medical: Not on file  Occupational History  . Not on file  Tobacco Use  . Smoking status: Former Smoker    Packs/day: 0.50    Years: 17.00    Pack years: 8.50    Types: Cigarettes  . Smokeless tobacco: Never Used  . Tobacco comment: quit since heart attack in 10/2015  Substance and Sexual Activity  . Alcohol use: Yes    Alcohol/week: 4.8 oz    Types: 8 Shots of liquor per week  . Drug use: Yes    Types: Marijuana  . Sexual activity: Not on file  Other Topics Concern  . Not on file  Social History Narrative   The patient is married with two children.    Family History:    Family History  Problem Relation Age of Onset  . Other Sister 58       THE PATIENT REPORTS A SISTER WITH A MYOCARDIAL INFARCTION IN HER 30s.(SHE IS NOW S/P CABG)  . Heart attack Father   . Deep vein thrombosis Father      ROS:  Please see the history of present illness.  All other ROS reviewed and negative.   Pt snores and wakes up gasping at times. He had mild obstructive sleep apnea in 2010   Physical Exam/Data:   Vitals:   03/01/17 1139 03/01/17 1209 03/01/17 1455 03/01/17 1456  BP: 125/62 125/62    Pulse: (!) 101 (!) 101 98 100  Resp: 18 18 18 18   Temp: 98.2 F (36.8 C) 98.2 F (36.8 C)    TempSrc: Oral Oral    SpO2: 100% 100%  100% 100%  Weight:      Height:        Intake/Output Summary (Last 24 hours) at 03/01/2017 1539 Last data filed at 03/01/2017 1440 Gross per 24 hour  Intake -  Output 525 ml  Net -525 ml   Filed Weights   02/27/17 1950 02/28/17 1536 03/01/17 0645  Weight: 230 lb (104.3 kg) 236 lb (107 kg) 236 lb 14.4 oz (107.5 kg)   Body mass index is 33.04 kg/m.  General:  Well nourished, well developed, in no acute distress HEENT: normal Lymph: no adenopathy Neck: no JVD Endocrine:  No thryomegaly Vascular: No carotid bruits; FA pulses 2+ bilaterally without bruits  Cardiac:  normal S1, S2; 2/6 MR murmur LSB, Lt axillary area Lungs:  clear to auscultation bilaterally, no wheezing, rhonchi or rales  Abd: soft, nontender, no hepatomegaly  Ext: no edema Musculoskeletal:  No deformities, BUE and BLE strength normal and equal Skin: warm and dry  Neuro:  CNs 2-12 intact, no focal abnormalities noted Psych:  Normal affect   EKG:  The EKG was personally reviewed and demonstrates:  NSR, inferior Qs, anterior lateral TWI Telemetry:  Telemetry was personally reviewed and demonstrates:  NSR  Relevant CV Studies: Echo 03/01/17- Study Conclusions  - Left ventricle: The cavity size was mildly dilated. Wall   thickness was normal. Diffuse hypokinesis with inferolateral   akinesis. Systolic function was severely reduced. The estimated   ejection fraction was in the range of 25% to 30%. Doppler   parameters are consistent with restrictive physiology, indicative   of decreased left ventricular diastolic compliance and/or   increased left atrial pressure. - Ventricular septum: D-shaped interventricular septum suggestive   of RV pressure/volume overload. - Aortic valve: There was no stenosis. - Mitral valve: There was moderate to severe regurgitation, suspect   infarct-related MR given inferolateral akinesis and restriction   of posterior leaflet. - Left atrium: The atrium was mildly dilated. - Right  ventricle: Poorly visualized. The cavity size was normal.   Systolic function was mildly reduced. - Right atrium: The atrium was mildly dilated. - Tricuspid valve: Peak RV-RA gradient (S): 50 mm Hg. - Pulmonary arteries: PA peak pressure: 58 mm Hg (S). - Systemic veins: IVC measured 2.0 cm with < 50% respirophasic   variation, suggesting RA pressure 8 mmHg.  Impressions:  - Mildly dilated LV with EF 25-30%, diffuse hypokinesis with   inferolateral akinesis. Restrictive diastolic function. RV poorly   visualized but appears  normal in size with mildly decreased   systolic function. D-shaped interventricular septum is suggestive   of RV pressure/volume overload. Moderate pulmonary hypertension.   Moderate to severe mitral regurgitation, suspect infarct-related   MR with restricted posterior leaflet and akinetic inferolateral   wall.  Laboratory Data:  Chemistry Recent Labs  Lab 02/27/17 1959 02/28/17 0234 03/01/17 0605  NA 140 139 140  K 4.2 4.2 4.7  CL 109 107 106  CO2 22 21* 24  GLUCOSE 96 114* 96  BUN 14 14 16   CREATININE 1.32* 1.33* 1.34*  CALCIUM 9.0 8.9 9.0  GFRNONAA >60 >60 >60  GFRAA >60 >60 >60  ANIONGAP 9 11 10     No results for input(s): PROT, ALBUMIN, AST, ALT, ALKPHOS, BILITOT in the last 168 hours. Hematology Recent Labs  Lab 02/27/17 1959 02/28/17 0234 03/01/17 0605  WBC 8.9 10.5 10.4  RBC 4.33 4.29 4.33  HGB 12.2* 12.1* 12.2*  HCT 38.8* 38.5* 39.0  MCV 89.6 89.7 90.1  MCH 28.2 28.2 28.2  MCHC 31.4 31.4 31.3  RDW 13.5 13.7 13.9  PLT 272 267 291   Cardiac Enzymes Recent Labs  Lab 03/01/17 0848  TROPONINI <0.03    Recent Labs  Lab 02/27/17 2011 02/28/17 0034  TROPIPOC 0.01 0.00    BNPNo results for input(s): BNP, PROBNP in the last 168 hours.  DDimer  Recent Labs  Lab 02/27/17 2224  DDIMER 5.92*    Radiology/Studies:  Dg Chest 2 View  Result Date: 02/27/2017 CLINICAL DATA:  Epigastric pain for 3 days, shortness of breath when  lying down and with exertion for 2 weeks, nausea, vomiting, diarrhea abdominal swelling, history cardiac disease (coronary artery disease post MI and coronary stenting), asthma, former smoker EXAM: CHEST  2 VIEW COMPARISON:  03/31/2008 FINDINGS: Enlargement of cardiac silhouette. Mediastinal contours and pulmonary vascularity normal. Minimal chronic peribronchial thickening. Tiny RIGHT pleural effusion. No acute infiltrate, LEFT pleural effusion or pneumothorax. Bones unremarkable. IMPRESSION: Enlargement of cardiac silhouette. Minimal chronic bronchitic changes and tiny RIGHT pleural effusion without acute infiltrate. Electronically Signed   By: Ulyses Southward M.D.   On: 02/27/2017 20:17   Ct Angio Chest Pe W And/or Wo Contrast  Result Date: 02/28/2017 CLINICAL DATA:  Acute onset of epigastric abdominal pain. Shortness of breath with exertion and when lying down. Elevated D-dimer. EXAM: CT ANGIOGRAPHY CHEST WITH CONTRAST TECHNIQUE: Multidetector CT imaging of the chest was performed using the standard protocol during bolus administration of intravenous contrast. Multiplanar CT image reconstructions and MIPs were obtained to evaluate the vascular anatomy. CONTRAST:  ISOVUE-370 IOPAMIDOL (ISOVUE-370) INJECTION 76% COMPARISON:  Chest radiograph performed 02/27/2017 FINDINGS: Cardiovascular: A small pulmonary infarct is suspected at the right lung base, raising concern for mild mostly resolved pulmonary embolus. No central pulmonary embolus is identified. The heart is borderline enlarged. The thoracic aorta is not well assessed but appears grossly unremarkable. The great vessels are grossly unremarkable in appearance. Mediastinum/Nodes: No mediastinal lymphadenopathy is seen. No pericardial effusion is identified. The visualized portions of the thyroid gland are unremarkable. No axillary lymphadenopathy is appreciated. Lungs/Pleura: A small right pleural effusion is noted. The hazy wedge-shaped opacity at the  right lung base is suspicious for pulmonary infarct. No pneumothorax is seen. The left lung appears clear. No masses are identified. Upper Abdomen: The visualized portions of the liver and spleen are unremarkable. There is reflux of contrast into the hepatic veins and IVC. Musculoskeletal: No acute osseous abnormalities are identified. The visualized musculature is unremarkable in appearance.  Review of the MIP images confirms the above findings. IMPRESSION: 1. Small pulmonary infarct suspected at the right lung base, raising concern for mild mostly resolved pulmonary embolus. 2. Associated small right pleural effusion noted. 3. Borderline cardiomegaly. Critical Value/emergent results were called by telephone at the time of interpretation on 02/28/2017 at 12:44 am to Dr. Wilkie AyeHorton, who verbally acknowledged these results. Electronically Signed   By: Roanna RaiderJeffery  Chang M.D.   On: 02/28/2017 00:47   Vas [place] Lower Extremity Venous (dvt)  Result Date: 02/28/2017  Lower Venous Study  Final Interpretation: Right: There is no evidence of deep vein thrombosis in the lower extremity. No cystic structure found in the popliteal fossa. Left: There is no evidence of deep vein thrombosis in the lower extremity. No cystic structure found in the popliteal fossa.  *See table(s) above for measurements and observations. Electronically signed by Lemar LivingsBrandon Cain on 02/28/2017 at 2:00:06 PM.  Final    Assessment and Plan:   Ischemic cardiomyopathy- new drop in EF with moderate to severe MR. Suspect he has occluded his CFX  CAD-s/p PCI 2007, RCA DES 2010, CFX DES and OM3 POBA Oct 2017.  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   Dyslipidemia-LDL was 141 in Oct 2017  Plan: MD to see. He will need coronary angiogram prior to starting anticoagulation. He received Xarelto this am, will stop and start heparin. Plan cath  for Wednesday.  Consider work up for hypercoagulable state- will defer to primary service.   For questions or updates, please contact [facility] Please consult [email or web address] for contact info under Cardiology/STEMI.   Signed, Corine ShelterLuke Kilroy, PA-C  03/01/2017 3:39 PM   I have seen and examined the patient along with Corine ShelterLuke Kilroy, PA-C .  I have reviewed the chart, notes and new data.  I agree with PA/NP's note.  Key new complaints: presentation is more consistent with acute HF rather than new acute coronary event. Still has orthopnea and chest tightness if he tries to lay flat. Key examination changes: clear lungs, hard to see JVD, no dependent edema, RRR, S3 present , 3/6 holosystolic apical murmur radiating both towards the axilla and the base Key new findings / data: echo findings consistent with extensive wall motion abnormality in LCX distribution with secondary ischemic MR and severe pulmonary HTN. EF is severely depressed.  PLAN: Acute systolic HF due to ischemic CMP and MR, likely due to ischemia and hibernation (versus infarction and scar) in LCX territory. Diuresis and vasodilators until he can lie flat. Coronary angio on Wednesday (received Xarelto today). Hopefully anatomy amenable to PCI (although there is a good chance that the LCX stent is occluded). If can be revascularized, hopefully will see myocardial recovery and improvement in MR. If this does not occur, may need MV repair and CABG, possibly ICD.  Joshua FairMihai Marion Seese, MD, [facility] [phone] 03/01/2017, 4:18 PM

## 2017-03-02 LAB — BASIC METABOLIC PANEL
ANION GAP: 11 (ref 5–15)
BUN: 16 mg/dL (ref 6–20)
CHLORIDE: 105 mmol/L (ref 101–111)
CO2: 24 mmol/L (ref 22–32)
Calcium: 9 mg/dL (ref 8.9–10.3)
Creatinine, Ser: 1.23 mg/dL (ref 0.61–1.24)
GFR calc non Af Amer: >60 mL/min (ref >60)
Glucose, Bld: 90 mg/dL (ref 65–99)
Potassium: 4.6 mmol/L (ref 3.5–5.1)
SODIUM: 140 mmol/L (ref 135–145)

## 2017-03-02 LAB — CBC
HEMATOCRIT: 38 % — AB (ref 39.0–52.0)
HEMOGLOBIN: 11.9 g/dL — AB (ref 13.0–17.0)
MCH: 28.1 pg (ref 26.0–34.0)
MCHC: 31.3 g/dL (ref 30.0–36.0)
MCV: 89.6 fL (ref 78.0–100.0)
Platelets: 251 10*3/uL (ref 150–400)
RBC: 4.24 MIL/uL (ref 4.22–5.81)
RDW: 13.8 % (ref 11.5–15.5)
WBC: 8.9 10*3/uL (ref 4.0–10.5)

## 2017-03-02 LAB — HEPARIN LEVEL (UNFRACTIONATED): HEPARIN UNFRACTIONATED: 1.92 [IU]/mL — AB (ref 0.30–0.70)

## 2017-03-02 LAB — APTT
APTT: 99 s — AB (ref 24–36)
APTT: 119 s — AB (ref 24–36)
aPTT: 91 seconds — ABNORMAL HIGH (ref 24–36)

## 2017-03-02 MED ORDER — FUROSEMIDE 10 MG/ML IJ SOLN
40.0000 mg | Freq: Once | INTRAMUSCULAR | Status: AC
  Administered 2017-03-02: 40 mg via INTRAVENOUS
  Filled 2017-03-02: qty 4

## 2017-03-02 MED ORDER — SODIUM CHLORIDE 0.9% FLUSH: 3.0000 mL | INTRAVENOUS | Status: DC | PRN

## 2017-03-02 MED ORDER — ASPIRIN EC 81 MG PO TBEC
81.0000 mg | DELAYED_RELEASE_TABLET | Freq: Every day | ORAL | Status: DC
  Administered 2017-03-04: 81 mg via ORAL
  Filled 2017-03-02: qty 1

## 2017-03-02 MED ORDER — ASPIRIN 81 MG PO CHEW
81.0000 mg | CHEWABLE_TABLET | ORAL | Status: AC
  Administered 2017-03-03: 81 mg via ORAL
  Filled 2017-03-02: qty 1

## 2017-03-02 MED ORDER — SODIUM CHLORIDE 0.9 % IV SOLN
INTRAVENOUS | Status: DC
  Administered 2017-03-03: 06:00:00 via INTRAVENOUS

## 2017-03-02 MED ORDER — SODIUM CHLORIDE 0.9% FLUSH
3.0000 mL | Freq: Two times a day (BID) | INTRAVENOUS | Status: DC
  Administered 2017-03-02: 3 mL via INTRAVENOUS

## 2017-03-02 MED ORDER — SODIUM CHLORIDE 0.9 % IV SOLN: 250.0000 mL | INTRAVENOUS | Status: DC | PRN

## 2017-03-02 MED ORDER — SODIUM CHLORIDE 0.9 % IV SOLN: INTRAVENOUS | Status: DC

## 2017-03-02 MED ORDER — CARVEDILOL 3.125 MG PO TABS
3.1250 mg | ORAL_TABLET | Freq: Two times a day (BID) | ORAL | Status: DC
  Administered 2017-03-02: 3.125 mg via ORAL
  Filled 2017-03-02: qty 1

## 2017-03-02 NOTE — Plan of Care (Signed)
  Clinical Measurements: Ability to maintain clinical measurements within normal limits will improve 03/02/2017 0033 - Progressing by Sheryle Hailolumbres, Zakiyyah Savannah A, RN Diagnostic test results will improve 03/02/2017 0033 - Progressing by Sheryle Hailolumbres, Rhyder Bratz A, RN Cardiovascular complication will be avoided 03/02/2017 0033 - Progressing by Kash Davie, Marlana Salvageonnie A, RN

## 2017-03-02 NOTE — Plan of Care (Signed)
  Clinical Measurements: Diagnostic test results will improve 03/02/2017 2015 - Progressing by Alayna Mabe A, RN   Education: Understanding of CV disease, CV risk reduction, and recovery process will improve 03/02/2017 2015 - Progressing by Reinhold Rickey A, RN   Cardiovascular: Vascular access site(s) Level 0-1 will be maintained 03/02/2017 2015 - Progressing by Denzal Meir, Marlana Salvageonnie A, RN

## 2017-03-02 NOTE — Progress Notes (Signed)
Pt states his pain is relieved with oxygen Asked pt why he didn't report his pain over night, pt states is is intermittent   Will continue to monitor

## 2017-03-02 NOTE — Progress Notes (Signed)
PROGRESS NOTE    Joshua May  ZOX:[id number] DOB: 1975/07/10 DOA: 02/27/2017 PCP: Patient, No Pcp Per     Brief Narrative:  Joshua May is a 42 y.o. male with medical history significant for hyperlipidemia, coronary artery disease with stents, and nonadherence with DAPT, now presenting to the emergency department for evaluation of epigastric and chest pain for several days with concomitant exertional dyspnea. Patient reports that the symptoms developed insidiously several days. 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 Denies any personal history of DVT or PE, but reports that his dad had a DVT. In the ED, CTA chest confirmed PE. He was started on lovenox and transitioned to Xarelto. Echocardiogram was completed which revealed worsening EF 25% with diffuse hypokinesis.   Assessment & Plan:   Principal Problem:   Pulmonary embolism (HCC) Active Problems:   HLD (hyperlipidemia)   CAD (coronary artery disease)  Pulmonary embolism -LE doppler negative for DVT -Heparin gtt for now, plan to transition back to xarelto   CAD -He had previously undergone BMS in 2010 and DES in October 2017. He was lost to follow up due to insurance issues and stopped Brilinta about 7 months ago along with statin, beta blocker, ACE inhibitor. He is currently on baby aspirin only -Cardiology consulted, plan for heart cath 2/13  -Coreg, lasix   Acute systolic HF, ICM -Echocardiogram with new EF 25-30% with diffuse hypokinesis and inferolateral akinesis -[place] O2 as needed -Coreg, lasix   HLD -Restart statin   ?OSA -Need sleep study as outpatient    DVT prophylaxis: Heparin gtt Code Status: Full Family Communication: No family at bedside Disposition Plan: Heart cath 2/13   Consultants:   Cardiology  Procedures:   None   Antimicrobials:  Anti-infectives (From  admission, onward)   None       Subjective: No complaints this morning. Had intermittent chest pains overnight but none currently. Breathing better on [place] O2.   Objective: Vitals:   03/01/17 1456 03/01/17 2031 03/02/17 0446 03/02/17 0749  BP:  118/80 117/77 111/81  Pulse: 100 95 97 (!) 101  Resp: 18 18 18 18   Temp:  (!) 97.4 F (36.3 C) 98.4 F (36.9 C)   TempSrc:  Oral Oral   SpO2: 100% 100% 100% 100%  Weight:   107.3 kg (236 lb 9.6 oz)   Height:        Intake/Output Summary (Last 24 hours) at 03/02/2017 1146 Last data filed at 03/02/2017 1118 Gross per 24 hour  Intake 240 ml  Output 1175 ml  Net -935 ml   Filed Weights   02/28/17 1536 03/01/17 0645 03/02/17 0446  Weight: 107 kg (236 lb) 107.5 kg (236 lb 14.4 oz) 107.3 kg (236 lb 9.6 oz)    Examination: General exam: Appears calm and comfortable  Respiratory system: Clear to auscultation. Respiratory effort normal. [place] O2  Cardiovascular system: S1 & S2 heard, RRR. +systolic murmur  Gastrointestinal system: Abdomen is nondistended, soft and nontender. No organomegaly or masses felt. Normal bowel sounds heard. Central nervous system: Alert and oriented. No focal neurological deficits. Extremities: Symmetric 5 x 5 power. Skin: No rashes, lesions or ulcers Psychiatry: Judgement and insight appear normal. Mood & affect appropriate.    Data Reviewed: I have personally reviewed following labs and imaging studies  CBC: Recent Labs  Lab 02/27/17 1959 02/28/17 0234 03/01/17 0605 03/02/17 0508  WBC 8.9  10.5 10.4 8.9  HGB 12.2* 12.1* 12.2* 11.9*  HCT 38.8* 38.5* 39.0 38.0*  MCV 89.6 89.7 90.1 89.6  PLT 272 267 291 251   Basic Metabolic Panel: Recent Labs  Lab 02/27/17 1959 02/28/17 0234 03/01/17 0605 03/02/17 0508  NA 140 139 140 140  K 4.2 4.2 4.7 4.6  CL 109 107 106 105  CO2 22 21* 24 24  GLUCOSE 96 114* 96 90  BUN 14 14 16 16   CREATININE 1.32* 1.33* 1.34* 1.23  CALCIUM 9.0 8.9 9.0 9.0    GFR: Estimated Creatinine Clearance: 99.5 mL/min (by C-G formula based on SCr of 1.23 mg/dL). Liver Function Tests: No results for input(s): AST, ALT, ALKPHOS, BILITOT, PROT, ALBUMIN in the last 168 hours. Recent Labs  Lab 02/27/17 1959  LIPASE 27   No results for input(s): AMMONIA in the last 168 hours. Coagulation Profile: No results for input(s): INR, PROTIME in the last 168 hours. Cardiac Enzymes: Recent Labs  Lab 03/01/17 0848  TROPONINI <0.03   BNP (last 3 results) No results for input(s): PROBNP in the last 8760 hours. HbA1C: No results for input(s): HGBA1C in the last 72 hours. CBG: No results for input(s): GLUCAP in the last 168 hours. Lipid Profile: No results for input(s): CHOL, HDL, LDLCALC, TRIG, CHOLHDL, LDLDIRECT in the last 72 hours. Thyroid Function Tests: No results for input(s): TSH, T4TOTAL, FREET4, T3FREE, THYROIDAB in the last 72 hours. Anemia Panel: No results for input(s): VITAMINB12, FOLATE, FERRITIN, TIBC, IRON, RETICCTPCT in the last 72 hours. Sepsis Labs: No results for input(s): PROCALCITON, LATICACIDVEN in the last 168 hours.  No results found for this or any previous visit (from the past 240 hour(s)).     Radiology Studies: Dg Chest 2 View  Result Date: 02/27/2017 CLINICAL DATA:  Epigastric pain for 3 days, shortness of breath when lying down and with exertion for 2 weeks, nausea, vomiting, diarrhea abdominal swelling, history cardiac disease (coronary artery disease post MI and coronary stenting), asthma, former smoker EXAM: CHEST  2 VIEW COMPARISON:  03/31/2008 FINDINGS: Enlargement of cardiac silhouette. Mediastinal contours and pulmonary vascularity normal. Minimal chronic peribronchial thickening. Tiny RIGHT pleural effusion. No acute infiltrate, LEFT pleural effusion or pneumothorax. Bones unremarkable. IMPRESSION: Enlargement of cardiac silhouette. Minimal chronic bronchitic changes and tiny RIGHT pleural effusion without acute  infiltrate. Electronically Signed   By: Ulyses Southward M.D.   On: 02/27/2017 20:17   Ct Angio Chest Pe W And/or Wo Contrast  Result Date: 02/28/2017 CLINICAL DATA:  Acute onset of epigastric abdominal pain. Shortness of breath with exertion and when lying down. Elevated D-dimer. EXAM: CT ANGIOGRAPHY CHEST WITH CONTRAST TECHNIQUE: Multidetector CT imaging of the chest was performed using the standard protocol during bolus administration of intravenous contrast. Multiplanar CT image reconstructions and MIPs were obtained to evaluate the vascular anatomy. CONTRAST:  ISOVUE-370 IOPAMIDOL (ISOVUE-370) INJECTION 76% COMPARISON:  Chest radiograph performed 02/27/2017 FINDINGS: Cardiovascular: A small pulmonary infarct is suspected at the right lung base, raising concern for mild mostly resolved pulmonary embolus. No central pulmonary embolus is identified. The heart is borderline enlarged. The thoracic aorta is not well assessed but appears grossly unremarkable. The great vessels are grossly unremarkable in appearance. Mediastinum/Nodes: No mediastinal lymphadenopathy is seen. No pericardial effusion is identified. The visualized portions of the thyroid gland are unremarkable. No axillary lymphadenopathy is appreciated. Lungs/Pleura: A small right pleural effusion is noted. The hazy wedge-shaped opacity at the right lung base is suspicious for pulmonary infarct. No pneumothorax is seen.  The left lung appears clear. No masses are identified. Upper Abdomen: The visualized portions of the liver and spleen are unremarkable. There is reflux of contrast into the hepatic veins and IVC. Musculoskeletal: No acute osseous abnormalities are identified. The visualized musculature is unremarkable in appearance. Review of the MIP images confirms the above findings. IMPRESSION: 1. Small pulmonary infarct suspected at the right lung base, raising concern for mild mostly resolved pulmonary embolus. 2. Associated small right pleural  effusion noted. 3. Borderline cardiomegaly. Critical Value/emergent results were called by telephone at the time of interpretation on 02/28/2017 at 12:44 am to Dr. Wilkie Aye, who verbally acknowledged these results. Electronically Signed   By: Roanna Raider M.D.   On: 02/28/2017 00:47   Vas [place] Lower Extremity Venous (dvt)  Result Date: 02/28/2017  Lower Venous Study Risk Factors: Confirmed PE. Examination Guidelines: A complete evaluation includes B-mode imaging, spectral doppler, color doppler, and power doppler as needed of all accessible portions of each vessel. Bilateral testing is considered an integral part of a complete examination. Limited examinations for reoccurring indications may be performed as noted. The reflux portion of the exam is performed with the patient in reverse Trendelenburg.  Right Venous Findings: +---------+---------------+---------+-----------+----------+-------+          CompressibilityPhasicitySpontaneityPropertiesSummary +---------+---------------+---------+-----------+----------+-------+ CFV      Full           Yes      Yes                          +---------+---------------+---------+-----------+----------+-------+ FV Prox  Full                                                 +---------+---------------+---------+-----------+----------+-------+ FV Mid   Full                                                 +---------+---------------+---------+-----------+----------+-------+ FV DistalFull                                                 +---------+---------------+---------+-----------+----------+-------+ PFV      Full                                                 +---------+---------------+---------+-----------+----------+-------+ POP      Full           Yes      Yes                          +---------+---------------+---------+-----------+----------+-------+ PTV      Full                                                  +---------+---------------+---------+-----------+----------+-------+ PERO     Full                                                 +---------+---------------+---------+-----------+----------+-------+  Left Venous Findings: +---------+---------------+---------+-----------+----------+-------+          CompressibilityPhasicitySpontaneityPropertiesSummary +---------+---------------+---------+-----------+----------+-------+ CFV      Full           Yes      Yes                          +---------+---------------+---------+-----------+----------+-------+ FV Prox  Full                                                 +---------+---------------+---------+-----------+----------+-------+ FV Mid   Full                                                 +---------+---------------+---------+-----------+----------+-------+ FV DistalFull                                                 +---------+---------------+---------+-----------+----------+-------+ PFV      Full                                                 +---------+---------------+---------+-----------+----------+-------+ POP      Full           Yes      Yes                          +---------+---------------+---------+-----------+----------+-------+ PTV      Full                                                 +---------+---------------+---------+-----------+----------+-------+ PERO     Full                                                 +---------+---------------+---------+-----------+----------+-------+    Final Interpretation: Right: There is no evidence of deep vein thrombosis in the lower extremity. No cystic structure found in the popliteal fossa. Left: There is no evidence of deep vein thrombosis in the lower extremity. No cystic structure found in the popliteal fossa.  *See table(s) above for measurements and observations. Electronically signed by Lemar LivingsBrandon Cain on 02/28/2017 at 2:00:06 PM.   Final     Scheduled Meds: . aspirin EC  81 mg Oral Daily  . atorvastatin  80 mg Oral q1800  . carvedilol  3.125 mg Oral BID WC  . sodium chloride flush  3 mL Intravenous Q12H  . sodium chloride flush  3 mL Intravenous Q12H   Continuous Infusions: . sodium chloride    . heparin Stopped (03/02/17 1113)     LOS: 2 days    Time spent: 30 minutes   Noralee StainJennifer Jaton Eilers, DO [facility] [email or web address] Password [email or web address] 03/02/2017, 11:46  AM

## 2017-03-02 NOTE — Progress Notes (Signed)
ANTICOAGULATION CONSULT NOTE  Pharmacy Consult:  Heparin Indication: pulmonary embolus  Allergies  Allergen Reactions  . Shellfish Allergy Anaphylaxis    Patient Measurements: Height: 5\' 11"  (180.3 cm) Weight: 236 lb 9.6 oz (107.3 kg) IBW/kg (Calculated) : 75.3 Heparin Dosing Weight: 98 kg  Vital Signs: Temp: 97.9 F (36.6 C) (02/12 1949) Temp Source: Oral (02/12 1949) BP: 118/83 (02/12 1949) Pulse Rate: 88 (02/12 1949)  Labs: Recent Labs    02/28/17 0234 03/01/17 0605 03/01/17 0848 03/02/17 0508 03/02/17 1125 03/02/17 1946  HGB 12.1* 12.2*  --  11.9*  --   --   HCT 38.5* 39.0  --  38.0*  --   --   PLT 267 291  --  251  --   --   APTT  --   --   --  99* 119* 91*  HEPARINUNFRC  --   --   --  1.92*  --   --   CREATININE 1.33* 1.34*  --  1.23  --   --   TROPONINI  --   --  <0.03  --   --   --      Assessment: 42 year old male with complaint of epigastric pain and SOB with exertion and lying down.  CT revealed small pulmonary infarct suspected at the right lung base raising concern for mild mostly resolved pulmonary embolus.  Patient was transitioned to Xarelto from Lovenox.  Cards plan for cath to rule out occluded CFx and Pharmacy consulted to transition patient to IV heparin.  BMET and CBC stable; no bleeding reported.  APTT therapeutic.  No overt bleeding or complications noted.   Goal of Therapy:  Heparin level 0.3-0.7 units/ml aPTT 66 - 102 seconds Monitor platelets by anticoagulation protocol: Yes    Plan:  Decrease heparin gtt slightly to 1450 units/hr F/U AM labs   Yuvin Bussiere D. Laney Potashang, PharmD, BCPS Pager:  [phone] 03/02/2017, 9:30 PM

## 2017-03-02 NOTE — Progress Notes (Addendum)
Pt states he has had chest tightness off and on all night  Pt states it is not new  Vital signs documented, PRN given, 2L nasal canula placed Pt sitting in bed watching tv  Will inform MD

## 2017-03-02 NOTE — Progress Notes (Signed)
ANTICOAGULATION CONSULT NOTE  Pharmacy Consult:  Xarelto >> IV heparin Indication: pulmonary embolus  Allergies  Allergen Reactions  . Shellfish Allergy Anaphylaxis    Patient Measurements: Height: 5\' 11"  (180.3 cm) Weight: 236 lb 9.6 oz (107.3 kg) IBW/kg (Calculated) : 75.3 Heparin Dosing Weight: 98 kg  Vital Signs: Temp: 98.4 F (36.9 C) (02/12 0446) Temp Source: Oral (02/12 0446) BP: 117/77 (02/12 0446) Pulse Rate: 97 (02/12 0446)  Labs: Recent Labs    02/28/17 0234 03/01/17 0605 03/01/17 0848 03/02/17 0508  HGB 12.1* 12.2*  --  11.9*  HCT 38.5* 39.0  --  38.0*  PLT 267 291  --  251  APTT  --   --   --  99*  HEPARINUNFRC  --   --   --  1.92*  CREATININE 1.33* 1.34*  --  1.23  TROPONINI  --   --  <0.03  --      Assessment: 42 year old male with complaint of epigastric pain and SOB with exertion and lying down.  CT revealed small pulmonary infarct suspected at the right lung base raising concern for mild mostly resolved pulmonary embolus.  Patient was transitioned to Xarelto from Lovenox.  Cards plan for cath to rule out occluded CFx and Pharmacy consulted to transition patient to IV heparin.  BMET and CBC stable; no bleeding reported.  APTT is wnl, heparin level is high as anticipated from Xarelto.  Goal of Therapy:  Heparin level 0.3-0.7 units/ml aPTT 66 - 102 seconds Monitor platelets by anticoagulation protocol: Yes    Plan:  Continue heparin at 1600 units/hr Check confirmatory aPTT Daily heparin level, aPTT and CBC   Agapito GamesAlison Margan Elias, PharmD, BCPS Clinical Pharmacist 03/02/2017 6:19 AM

## 2017-03-02 NOTE — Progress Notes (Signed)
CCMD called stated pt had 6 beats of vtach, stripped saved  PA for Cardiology aware  No new orders

## 2017-03-02 NOTE — Progress Notes (Signed)
Cardiology PA  Aware of pt chest pain, no new orders at this time  PA aware of norco given and nasal canula given

## 2017-03-02 NOTE — Progress Notes (Signed)
ANTICOAGULATION CONSULT NOTE  Pharmacy Consult:  Xarelto >> IV heparin Indication: pulmonary embolus  Allergies  Allergen Reactions  . Shellfish Allergy Anaphylaxis    Patient Measurements: Height: 5\' 11"  (180.3 cm) Weight: 236 lb 9.6 oz (107.3 kg) IBW/kg (Calculated) : 75.3 Heparin Dosing Weight: 98 kg  Vital Signs: Temp: 98.4 F (36.9 C) (02/12 0446) Temp Source: Oral (02/12 0446) BP: 111/81 (02/12 0749) Pulse Rate: 101 (02/12 0749)  Labs: Recent Labs    02/28/17 0234 03/01/17 0605 03/01/17 0848 03/02/17 0508 03/02/17 1125  HGB 12.1* 12.2*  --  11.9*  --   HCT 38.5* 39.0  --  38.0*  --   PLT 267 291  --  251  --   APTT  --   --   --  99* 119*  HEPARINUNFRC  --   --   --  1.92*  --   CREATININE 1.33* 1.34*  --  1.23  --   TROPONINI  --   --  <0.03  --   --      Assessment: 42 year old male with complaint of epigastric pain and SOB with exertion and lying down.  CT revealed small pulmonary infarct suspected at the right lung base raising concern for mild mostly resolved pulmonary embolus.  Patient was transitioned to Xarelto from Lovenox.  Cards plan for cath to rule out occluded CFx and Pharmacy consulted to transition patient to IV heparin.  BMET and CBC stable; no bleeding reported.  Aptt this afternoon is above goal.  No overt bleeding or complications noted.  Goal of Therapy:  Heparin level 0.3-0.7 units/ml aPTT 66 - 102 seconds Monitor platelets by anticoagulation protocol: Yes    Plan:  Decrease IV heparin to 1500 units/hr Check confirmatory aPTT in 6 hrs. Daily heparin level, aPTT and CBC  Tad MooreJessica Sally-Anne Wamble, Pharm D, BCPS  Clinical Pharmacist Pager [phone]  03/02/2017 1:49 PM

## 2017-03-02 NOTE — Progress Notes (Signed)
Progress Note  Patient Name: Joshua May Date of Encounter: 03/02/2017  Primary Cardiologist: No primary care provider on file.   Subjective   He has improved but he is still mildly orthopneic.  Has not had any chest tightness. Brief episode of asymptomatic nonsustained VT earlier today.  Inpatient Medications    Scheduled Meds: . aspirin EC  81 mg Oral Daily  . atorvastatin  80 mg Oral q1800  . carvedilol  3.125 mg Oral BID WC  . sodium chloride flush  3 mL Intravenous Q12H  . sodium chloride flush  3 mL Intravenous Q12H   Continuous Infusions: . sodium chloride    . heparin 1,600 Units/hr (03/02/17 0010)   PRN Meds: sodium chloride, acetaminophen **OR** acetaminophen, bisacodyl, HYDROcodone-acetaminophen, morphine injection, ondansetron **OR** ondansetron (ZOFRAN) IV, senna-docusate, sodium chloride flush   Vital Signs    Vitals:   03/01/17 1456 03/01/17 2031 03/02/17 0446 03/02/17 0749  BP:  118/80 117/77 111/81  Pulse: 100 95 97 (!) 101  Resp: 18 18 18 18   Temp:  (!) 97.4 F (36.3 C) 98.4 F (36.9 C)   TempSrc:  Oral Oral   SpO2: 100% 100% 100% 100%  Weight:   236 lb 9.6 oz (107.3 kg)   Height:        Intake/Output Summary (Last 24 hours) at 03/02/2017 0948 Last data filed at 03/02/2017 4782 Gross per 24 hour  Intake 0 ml  Output 1025 ml  Net -1025 ml   Filed Weights   02/28/17 1536 03/01/17 0645 03/02/17 0446  Weight: 236 lb (107 kg) 236 lb 14.4 oz (107.5 kg) 236 lb 9.6 oz (107.3 kg)    Telemetry    Sinus rhythm, a single brief 5 beat run of nonsustained VT- Personally Reviewed  ECG    No new tracing- Personally Reviewed  Physical Exam  Appears very comfortable while sitting up GEN: No acute distress.   Neck: No JVD Cardiac: RRR, no murmurs, rubs, or gallops.  Respiratory: Clear to auscultation bilaterally. GI: Soft, nontender, non-distended  MS: No edema; No deformity. Neuro:  Nonfocal  Psych: Normal affect   Labs     Chemistry Recent Labs  Lab 02/28/17 0234 03/01/17 0605 03/02/17 0508  NA 139 140 140  K 4.2 4.7 4.6  CL 107 106 105  CO2 21* 24 24  GLUCOSE 114* 96 90  BUN 14 16 16   CREATININE 1.33* 1.34* 1.23  CALCIUM 8.9 9.0 9.0  GFRNONAA >60 >60 >60  GFRAA >60 >60 >60  ANIONGAP 11 10 11      Hematology Recent Labs  Lab 02/28/17 0234 03/01/17 0605 03/02/17 0508  WBC 10.5 10.4 8.9  RBC 4.29 4.33 4.24  HGB 12.1* 12.2* 11.9*  HCT 38.5* 39.0 38.0*  MCV 89.7 90.1 89.6  MCH 28.2 28.2 28.1  MCHC 31.4 31.3 31.3  RDW 13.7 13.9 13.8  PLT 267 291 251    Cardiac Enzymes Recent Labs  Lab 03/01/17 0848  TROPONINI <0.03    Recent Labs  Lab 02/27/17 2011 02/28/17 0034  TROPIPOC 0.01 0.00     BNPNo results for input(s): BNP, PROBNP in the last 168 hours.   DDimer  Recent Labs  Lab 02/27/17 2224  DDIMER 5.92*     Radiology    Vas [place] Lower Extremity Venous (dvt)  Result Date: 02/28/2017  Final Interpretation: Right: There is no evidence of deep vein thrombosis in the lower extremity. No cystic structure found in the popliteal fossa. Left: There is no evidence  of deep vein thrombosis in the lower extremity. No cystic structure found in the popliteal fossa.    Cardiac Studies   Echo 03/01/17- Study Conclusions  - Left ventricle: The cavity size was mildly dilated. Wall thickness was normal. Diffuse hypokinesis with inferolateral akinesis. Systolic function was severely reduced. The estimated ejection fraction was in the range of 25% to 30%. Doppler parameters are consistent with restrictive physiology, indicative of decreased left ventricular diastolic compliance and/or increased left atrial pressure. - Ventricular septum: D-shaped interventricular septum suggestive of RV pressure/volume overload. - Aortic valve: There was no stenosis. - Mitral valve: There was moderate to severe regurgitation, suspect infarct-related MR given inferolateral akinesis and  restriction of posterior leaflet. - Left atrium: The atrium was mildly dilated. - Right ventricle: Poorly visualized. The cavity size was normal. Systolic function was mildly reduced. - Right atrium: The atrium was mildly dilated. - Tricuspid valve: Peak RV-RA gradient (S): 50 mm Hg. - Pulmonary arteries: PA peak pressure: 58 mm Hg (S). - Systemic veins: IVC measured 2.0 cm with < 50% respirophasic variation, suggesting RA pressure 8 mmHg.  Impressions:  - Mildly dilated LV with EF 25-30%, diffuse hypokinesis with inferolateral akinesis. Restrictive diastolic function. RV poorly visualized but appears normal in size with mildly decreased systolic function. D-shaped interventricular septum is suggestive of RV pressure/volume overload. Moderate pulmonary hypertension. Moderate to severe mitral regurgitation, suspect infarct-related MR with restricted posterior leaflet and akinetic inferolateral wall.  Patient Profile     42 y.o. 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 Also identified to have a small right lower lobe pulmonary embolism.  Assessment & Plan    1. CHF: Still has orthopnea, but suspect that by tomorrow will be ready to lie supine for cardiac catheterization.  Renal function improving, will give another dose of intravenous diuretic today. 2. CAD: Echo findings suggests complete occlusion of the left circumflex coronary artery with secondary wall motion abnormality and ischemic mitral insufficiency.  Hopefully there will be an option to open that vessel again. 3. MR: Echo  appearance is strongly consistent with ischemic posterior leaflet tethering.  Fully he has hibernating myocardium that can be recruited with secondary improvement in the degree of mitral insufficiency.  If not he will require high risk mitral valve surgery and bypass, probably ICD. 4. PE: His clinical presentation is much more suggestive of heart failure rather than pulmonary embolism, but he will require anticoagulation.  His father did have a history of a "clotting disorder".  Plan to restart Xarelto after the cath/revascularization.  For questions or updates, please contact [facility] Please consult [email or web address] for contact info under Cardiology/STEMI.      Signed, Thurmon FairMihai Diksha Tagliaferro, MD  03/02/2017, 9:48 AM

## 2017-03-03 ENCOUNTER — Encounter (HOSPITAL_COMMUNITY): Payer: Self-pay | Admitting: Cardiology

## 2017-03-03 ENCOUNTER — Inpatient Hospital Stay (HOSPITAL_COMMUNITY)
Admission: EM | Disposition: A | Discharge status: Home / Self Care | Payer: Self-pay | Source: Home / Self Care | Attending: Internal Medicine

## 2017-03-03 DIAGNOSIS — I25119 Atherosclerotic heart disease of native coronary artery with unspecified angina pectoris: Secondary | ICD-10-CM

## 2017-03-03 DIAGNOSIS — I255 Ischemic cardiomyopathy: Secondary | ICD-10-CM

## 2017-03-03 HISTORY — PX: LEFT HEART CATH AND CORONARY ANGIOGRAPHY: CATH118249

## 2017-03-03 HISTORY — PX: CORONARY PRESSURE/FFR STUDY: CATH118243

## 2017-03-03 LAB — BASIC METABOLIC PANEL
Anion gap: 12 (ref 5–15)
BUN: 20 mg/dL (ref 6–20)
CHLORIDE: 105 mmol/L (ref 101–111)
CO2: 23 mmol/L (ref 22–32)
CREATININE: 1.19 mg/dL (ref 0.61–1.24)
Calcium: 8.8 mg/dL — ABNORMAL LOW (ref 8.9–10.3)
GFR calc Af Amer: >60 mL/min (ref >60)
GFR calc non Af Amer: >60 mL/min (ref >60)
GLUCOSE: 92 mg/dL (ref 65–99)
Potassium: 4.4 mmol/L (ref 3.5–5.1)
SODIUM: 140 mmol/L (ref 135–145)

## 2017-03-03 LAB — CBC
HEMATOCRIT: 39 % (ref 39.0–52.0)
HEMOGLOBIN: 12 g/dL — AB (ref 13.0–17.0)
MCH: 27.6 pg (ref 26.0–34.0)
MCHC: 30.8 g/dL (ref 30.0–36.0)
MCV: 89.9 fL (ref 78.0–100.0)
Platelets: 242 10*3/uL (ref 150–400)
RBC: 4.34 MIL/uL (ref 4.22–5.81)
RDW: 13.6 % (ref 11.5–15.5)
WBC: 8.5 10*3/uL (ref 4.0–10.5)

## 2017-03-03 LAB — POCT ACTIVATED CLOTTING TIME: ACTIVATED CLOTTING TIME: 301 s

## 2017-03-03 LAB — PROTIME-INR
INR: 1.31
Prothrombin Time: 16.1 seconds — ABNORMAL HIGH (ref 11.4–15.2)

## 2017-03-03 LAB — HEPARIN LEVEL (UNFRACTIONATED): HEPARIN UNFRACTIONATED: 0.66 [IU]/mL (ref 0.30–0.70)

## 2017-03-03 LAB — APTT: aPTT: 76 seconds — ABNORMAL HIGH (ref 24–36)

## 2017-03-03 SURGERY — LEFT HEART CATH AND CORONARY ANGIOGRAPHY
Anesthesia: LOCAL

## 2017-03-03 MED ORDER — LIDOCAINE HCL (PF) 1 % IJ SOLN
INTRAMUSCULAR | Status: DC | PRN
  Administered 2017-03-03: 2 mL

## 2017-03-03 MED ORDER — VERAPAMIL HCL 2.5 MG/ML IV SOLN
INTRAVENOUS | Status: AC
  Filled 2017-03-03: qty 2

## 2017-03-03 MED ORDER — ADENOSINE (DIAGNOSTIC) 140MCG/KG/MIN
INTRAVENOUS | Status: DC | PRN
  Administered 2017-03-03: 140 ug/kg/min via INTRAVENOUS

## 2017-03-03 MED ORDER — IOPAMIDOL (ISOVUE-370) INJECTION 76%
INTRAVENOUS | Status: AC
  Filled 2017-03-03: qty 50

## 2017-03-03 MED ORDER — ADENOSINE 12 MG/4ML IV SOLN
INTRAVENOUS | Status: AC
  Filled 2017-03-03: qty 16

## 2017-03-03 MED ORDER — RIVAROXABAN 20 MG PO TABS: 20.0000 mg | ORAL_TABLET | Freq: Every day | ORAL | Status: DC

## 2017-03-03 MED ORDER — RIVAROXABAN 15 MG PO TABS
15.0000 mg | ORAL_TABLET | Freq: Two times a day (BID) | ORAL | Status: DC
  Administered 2017-03-04 – 2017-03-05 (×3): 15 mg via ORAL
  Filled 2017-03-03 (×3): qty 1

## 2017-03-03 MED ORDER — RIVAROXABAN 15 MG PO TABS
15.0000 mg | ORAL_TABLET | Freq: Two times a day (BID) | ORAL | Status: DC
  Administered 2017-03-03: 15 mg via ORAL
  Filled 2017-03-03: qty 1

## 2017-03-03 MED ORDER — FUROSEMIDE 10 MG/ML IJ SOLN
40.0000 mg | Freq: Two times a day (BID) | INTRAMUSCULAR | Status: DC
  Administered 2017-03-03: 40 mg via INTRAVENOUS
  Filled 2017-03-03: qty 4

## 2017-03-03 MED ORDER — SODIUM CHLORIDE 0.9 % IV SOLN: INTRAVENOUS | Status: DC

## 2017-03-03 MED ORDER — FENTANYL CITRATE (PF) 100 MCG/2ML IJ SOLN
INTRAMUSCULAR | Status: AC
  Filled 2017-03-03: qty 2

## 2017-03-03 MED ORDER — FUROSEMIDE 10 MG/ML IJ SOLN
40.0000 mg | Freq: Two times a day (BID) | INTRAMUSCULAR | Status: DC
  Administered 2017-03-03 – 2017-03-04 (×2): 40 mg via INTRAVENOUS
  Filled 2017-03-03 (×2): qty 4

## 2017-03-03 MED ORDER — MIDAZOLAM HCL 2 MG/2ML IJ SOLN
INTRAMUSCULAR | Status: AC
  Filled 2017-03-03: qty 2

## 2017-03-03 MED ORDER — MIDAZOLAM HCL 2 MG/2ML IJ SOLN
INTRAMUSCULAR | Status: DC | PRN
  Administered 2017-03-03 (×2): 1 mg via INTRAVENOUS

## 2017-03-03 MED ORDER — HEPARIN (PORCINE) IN NACL 2-0.9 UNIT/ML-% IJ SOLN
INTRAMUSCULAR | Status: AC | PRN
  Administered 2017-03-03 (×2): 500 mL

## 2017-03-03 MED ORDER — LIDOCAINE HCL (PF) 1 % IJ SOLN
INTRAMUSCULAR | Status: AC
  Filled 2017-03-03: qty 30

## 2017-03-03 MED ORDER — CARVEDILOL 3.125 MG PO TABS
3.1250 mg | ORAL_TABLET | Freq: Two times a day (BID) | ORAL | Status: DC
  Administered 2017-03-03 – 2017-03-05 (×5): 3.125 mg via ORAL
  Filled 2017-03-03 (×5): qty 1

## 2017-03-03 MED ORDER — HEPARIN (PORCINE) IN NACL 2-0.9 UNIT/ML-% IJ SOLN
INTRAMUSCULAR | Status: AC
  Filled 2017-03-03: qty 1000

## 2017-03-03 MED ORDER — VERAPAMIL HCL 2.5 MG/ML IV SOLN
INTRAVENOUS | Status: DC | PRN
  Administered 2017-03-03: 10 mL via INTRA_ARTERIAL

## 2017-03-03 MED ORDER — HEPARIN SODIUM (PORCINE) 1000 UNIT/ML IJ SOLN
INTRAMUSCULAR | Status: DC | PRN
  Administered 2017-03-03 (×2): 5500 [IU] via INTRAVENOUS

## 2017-03-03 MED ORDER — SODIUM CHLORIDE 0.9 % IV SOLN: 250.0000 mL | INTRAVENOUS | Status: DC | PRN

## 2017-03-03 MED ORDER — FENTANYL CITRATE (PF) 100 MCG/2ML IJ SOLN
INTRAMUSCULAR | Status: DC | PRN
  Administered 2017-03-03 (×2): 25 ug via INTRAVENOUS

## 2017-03-03 MED ORDER — SODIUM CHLORIDE 0.9% FLUSH: 3.0000 mL | Freq: Two times a day (BID) | INTRAVENOUS | Status: DC

## 2017-03-03 MED ORDER — SODIUM CHLORIDE 0.9 % IV SOLN
INTRAVENOUS | Status: AC | PRN
  Administered 2017-03-03: 20 mL/h via INTRAVENOUS

## 2017-03-03 MED ORDER — HEPARIN SODIUM (PORCINE) 1000 UNIT/ML IJ SOLN
INTRAMUSCULAR | Status: AC
  Filled 2017-03-03: qty 1

## 2017-03-03 MED ORDER — LOSARTAN POTASSIUM 25 MG PO TABS
25.0000 mg | ORAL_TABLET | Freq: Every day | ORAL | Status: DC
  Administered 2017-03-03 – 2017-03-05 (×3): 25 mg via ORAL
  Filled 2017-03-03 (×3): qty 1

## 2017-03-03 MED ORDER — SODIUM CHLORIDE 0.9% FLUSH
3.0000 mL | INTRAVENOUS | Status: DC | PRN
Start: 2017-03-03 — End: 2017-03-03

## 2017-03-03 MED ORDER — IOPAMIDOL (ISOVUE-370) INJECTION 76%
INTRAVENOUS | Status: DC | PRN
  Administered 2017-03-03: 108 mL

## 2017-03-03 SURGICAL SUPPLY — 17 items
CATH INFINITI 5 FR JL3.5 (CATHETERS) ×2 IMPLANT
CATH INFINITI 5FR ANG PIGTAIL (CATHETERS) ×2 IMPLANT
CATH LAUNCHER 5F EBU3.5 (CATHETERS) ×2 IMPLANT
CATH MICROCATH NAVVUS (MICROCATHETER) ×1 IMPLANT
CATH OPTITORQUE TIG 4.0 5F (CATHETERS) ×2 IMPLANT
CATH VISTA GUIDE 6FR XBLAD3.5 (CATHETERS) ×4 IMPLANT
DEVICE RAD COMP TR BAND LRG (VASCULAR PRODUCTS) ×2 IMPLANT
GLIDESHEATH SLEND A-KIT 6F 22G (SHEATH) ×2 IMPLANT
GUIDEWIRE INQWIRE 1.5J.035X260 (WIRE) ×1 IMPLANT
INQWIRE 1.5J .035X260CM (WIRE) ×2
KIT ESSENTIALS PG (KITS) ×2 IMPLANT
KIT HEMO VALVE WATCHDOG (MISCELLANEOUS) ×2 IMPLANT
KIT PREMIUM HAND CONTROLLER (KITS) ×2 IMPLANT
KIT SINGLE USE MANIFOLD (KITS) ×2 IMPLANT
MICROCATHETER NAVVUS (MICROCATHETER) ×2
PACK CARDIAC CATHETERIZATION (CUSTOM PROCEDURE TRAY) ×2 IMPLANT
WIRE ASAHI PROWATER 180CM (WIRE) ×2 IMPLANT

## 2017-03-03 NOTE — Progress Notes (Signed)
PROGRESS NOTE    Joshua May  XBM:[id number] DOB: 12-04-1975 DOA: 02/27/2017 PCP: Patient, No Pcp Per     Brief Narrative:  Joshua May is a 42 y.o. male with medical history significant for hyperlipidemia, coronary artery disease with stents, and nonadherence with DAPT, now presenting to the emergency department for evaluation of epigastric and chest pain for several days with concomitant exertional dyspnea. Patient reports that the symptoms developed insidiously several days. 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 Denies any personal history of DVT or PE, but reports that his dad had a DVT. In the ED, CTA chest confirmed PE. He was started on lovenox and transitioned to Xarelto. Echocardiogram was completed which revealed worsening EF 25% with diffuse hypokinesis. Cardiology was consulted.   Assessment & Plan:   Principal Problem:   Pulmonary embolism (HCC) Active Problems:   HLD (hyperlipidemia)   CAD (coronary artery disease)  Pulmonary embolism -LE doppler negative for DVT -Heparin gtt for now, plan to transition back to xarelto after cath   CAD -He had previously undergone BMS in 2010 and DES in October 2017. He was lost to follow up due to insurance issues and stopped Brilinta about 7 months ago along with statin, beta blocker, ACE inhibitor. He is currently on baby aspirin only at home  -Cardiology consulted, plan for heart cath today  -Coreg  Acute systolic HF, ICM -Echocardiogram with new EF 25-30% with diffuse hypokinesis and inferolateral akinesis - O2 as needed -Lasix given yesterday  -Coreg  HLD -Restart statin   ?OSA -Need sleep study as outpatient    DVT prophylaxis: Heparin gtt Code Status: Full Family Communication: No family at bedside Disposition Plan: Heart cath today    Consultants:   Cardiology  Procedures:    None   Antimicrobials:  Anti-infectives (From admission, onward)   None       Subjective: No chest pain, shortness of breath overnight or this morning.   Objective: Vitals:   03/02/17 1439 03/02/17 1949 03/03/17 0611 03/03/17 0829  BP: 101/71 118/83 119/76 115/88  Pulse: 86 88 95 87  Resp: 18 18 18    Temp: (!) 97.5 F (36.4 C) 97.9 F (36.6 C) 98 F (36.7 C)   TempSrc: Oral Oral Oral   SpO2: 100% 100% 100%   Weight:   104.5 kg (230 lb 6.4 oz)   Height:        Intake/Output Summary (Last 24 hours) at 03/03/2017 0928 Last data filed at 03/03/2017 0908 Gross per 24 hour  Intake 1206 ml  Output 2100 ml  Net -894 ml   Filed Weights   03/01/17 0645 03/02/17 0446 03/03/17 0611  Weight: 107.5 kg (236 lb 14.4 oz) 107.3 kg (236 lb 9.6 oz) 104.5 kg (230 lb 6.4 oz)    Examination: General exam: Appears calm and comfortable  Respiratory system: Clear to auscultation. Respiratory effort normal. Cardiovascular system: S1 & S2 heard, RRR. +systolic murmur  Gastrointestinal system: Abdomen is nondistended, soft and nontender. No organomegaly or masses felt. Normal bowel sounds heard. Central nervous system: Alert and oriented. No focal neurological deficits. Extremities: Symmetric 5 x 5 power. Skin: No rashes, lesions or ulcers Psychiatry: Judgement and insight appear normal. Mood & affect appropriate.     Data Reviewed: I have personally reviewed following labs and imaging studies  CBC: Recent Labs  Lab 02/27/17 1959 02/28/17 0234 03/01/17 0272 03/02/17  [id number] 03/03/17 0557  WBC 8.9 10.5 10.4 8.9 8.5  HGB 12.2* 12.1* 12.2* 11.9* 12.0*  HCT 38.8* 38.5* 39.0 38.0* 39.0  MCV 89.6 89.7 90.1 89.6 89.9  PLT 272 267 291 251 242   Basic Metabolic Panel: Recent Labs  Lab 02/27/17 1959 02/28/17 0234 03/01/17 0605 03/02/17 0508 03/03/17 0557  NA 140 139 140 140 140  K 4.2 4.2 4.7 4.6 4.4  CL 109 107 106 105 105  CO2 22 21* 24 24 23   GLUCOSE 96 114* 96 90 92  BUN  14 14 16 16 20   CREATININE 1.32* 1.33* 1.34* 1.23 1.19  CALCIUM 9.0 8.9 9.0 9.0 8.8*   GFR: Estimated Creatinine Clearance: 101.5 mL/min (by C-G formula based on SCr of 1.19 mg/dL). Liver Function Tests: No results for input(s): AST, ALT, ALKPHOS, BILITOT, PROT, ALBUMIN in the last 168 hours. Recent Labs  Lab 02/27/17 1959  LIPASE 27   No results for input(s): AMMONIA in the last 168 hours. Coagulation Profile: Recent Labs  Lab 03/03/17 0557  INR 1.31   Cardiac Enzymes: Recent Labs  Lab 03/01/17 0848  TROPONINI <0.03   BNP (last 3 results) No results for input(s): PROBNP in the last 8760 hours. HbA1C: No results for input(s): HGBA1C in the last 72 hours. CBG: No results for input(s): GLUCAP in the last 168 hours. Lipid Profile: No results for input(s): CHOL, HDL, LDLCALC, TRIG, CHOLHDL, LDLDIRECT in the last 72 hours. Thyroid Function Tests: No results for input(s): TSH, T4TOTAL, FREET4, T3FREE, THYROIDAB in the last 72 hours. Anemia Panel: No results for input(s): VITAMINB12, FOLATE, FERRITIN, TIBC, IRON, RETICCTPCT in the last 72 hours. Sepsis Labs: No results for input(s): PROCALCITON, LATICACIDVEN in the last 168 hours.  No results found for this or any previous visit (from the past 240 hour(s)).     Radiology Studies: Dg Chest 2 View  Result Date: 02/27/2017 CLINICAL DATA:  Epigastric pain for 3 days, shortness of breath when lying down and with exertion for 2 weeks, nausea, vomiting, diarrhea abdominal swelling, history cardiac disease (coronary artery disease post MI and coronary stenting), asthma, former smoker EXAM: CHEST  2 VIEW COMPARISON:  03/31/2008 FINDINGS: Enlargement of cardiac silhouette. Mediastinal contours and pulmonary vascularity normal. Minimal chronic peribronchial thickening. Tiny RIGHT pleural effusion. No acute infiltrate, LEFT pleural effusion or pneumothorax. Bones unremarkable. IMPRESSION: Enlargement of cardiac silhouette. Minimal chronic  bronchitic changes and tiny RIGHT pleural effusion without acute infiltrate. Electronically Signed   By: Ulyses SouthwardMark  Boles M.D.   On: 02/27/2017 20:17   Ct Angio Chest Pe W And/or Wo Contrast  Result Date: 02/28/2017 CLINICAL DATA:  Acute onset of epigastric abdominal pain. Shortness of breath with exertion and when lying down. Elevated D-dimer. EXAM: CT ANGIOGRAPHY CHEST WITH CONTRAST TECHNIQUE: Multidetector CT imaging of the chest was performed using the standard protocol during bolus administration of intravenous contrast. Multiplanar CT image reconstructions and MIPs were obtained to evaluate the vascular anatomy. CONTRAST:  100mL ISOVUE-370 IOPAMIDOL (ISOVUE-370) INJECTION 76% COMPARISON:  Chest radiograph performed 02/27/2017 FINDINGS: Cardiovascular: A small pulmonary infarct is suspected at the right lung base, raising concern for mild mostly resolved pulmonary embolus. No central pulmonary embolus is identified. The heart is borderline enlarged. The thoracic aorta is not well assessed but appears grossly unremarkable. The great vessels are grossly unremarkable in appearance. Mediastinum/Nodes: No mediastinal lymphadenopathy is seen. No pericardial effusion is identified. The visualized portions of the thyroid gland are unremarkable. No axillary lymphadenopathy is appreciated. Lungs/Pleura: A small right pleural  effusion is noted. The hazy wedge-shaped opacity at the right lung base is suspicious for pulmonary infarct. No pneumothorax is seen. The left lung appears clear. No masses are identified. Upper Abdomen: The visualized portions of the liver and spleen are unremarkable. There is reflux of contrast into the hepatic veins and IVC. Musculoskeletal: No acute osseous abnormalities are identified. The visualized musculature is unremarkable in appearance. Review of the MIP images confirms the above findings. IMPRESSION: 1. Small pulmonary infarct suspected at the right lung base, raising concern for mild  mostly resolved pulmonary embolus. 2. Associated small right pleural effusion noted. 3. Borderline cardiomegaly. Critical Value/emergent results were called by telephone at the time of interpretation on 02/28/2017 at 12:44 am to Dr. Wilkie Aye, who verbally acknowledged these results. Electronically Signed   By: Roanna Raider M.D.   On: 02/28/2017 00:47   Vas [place] Lower Extremity Venous (dvt)  Result Date: 02/28/2017  Lower Venous Study Risk Factors: Confirmed PE. Examination Guidelines: A complete evaluation includes B-mode imaging, spectral doppler, color doppler, and power doppler as needed of all accessible portions of each vessel. Bilateral testing is considered an integral part of a complete examination. Limited examinations for reoccurring indications may be performed as noted. The reflux portion of the exam is performed with the patient in reverse Trendelenburg.  Right Venous Findings: +---------+---------------+---------+-----------+----------+-------+          CompressibilityPhasicitySpontaneityPropertiesSummary +---------+---------------+---------+-----------+----------+-------+ CFV      Full           Yes      Yes                          +---------+---------------+---------+-----------+----------+-------+ FV Prox  Full                                                 +---------+---------------+---------+-----------+----------+-------+ FV Mid   Full                                                 +---------+---------------+---------+-----------+----------+-------+ FV DistalFull                                                 +---------+---------------+---------+-----------+----------+-------+ PFV      Full                                                 +---------+---------------+---------+-----------+----------+-------+ POP      Full           Yes      Yes                          +---------+---------------+---------+-----------+----------+-------+ PTV       Full                                                 +---------+---------------+---------+-----------+----------+-------+  PERO     Full                                                 +---------+---------------+---------+-----------+----------+-------+  Left Venous Findings: +---------+---------------+---------+-----------+----------+-------+          CompressibilityPhasicitySpontaneityPropertiesSummary +---------+---------------+---------+-----------+----------+-------+ CFV      Full           Yes      Yes                          +---------+---------------+---------+-----------+----------+-------+ FV Prox  Full                                                 +---------+---------------+---------+-----------+----------+-------+ FV Mid   Full                                                 +---------+---------------+---------+-----------+----------+-------+ FV DistalFull                                                 +---------+---------------+---------+-----------+----------+-------+ PFV      Full                                                 +---------+---------------+---------+-----------+----------+-------+ POP      Full           Yes      Yes                          +---------+---------------+---------+-----------+----------+-------+ PTV      Full                                                 +---------+---------------+---------+-----------+----------+-------+ PERO     Full                                                 +---------+---------------+---------+-----------+----------+-------+    Final Interpretation: Right: There is no evidence of deep vein thrombosis in the lower extremity. No cystic structure found in the popliteal fossa. Left: There is no evidence of deep vein thrombosis in the lower extremity. No cystic structure found in the popliteal fossa.  *See table(s) above for measurements and observations. Electronically  signed by Lemar Livings on 02/28/2017 at 2:00:06 PM.  Final     Scheduled Meds: . [START ON 03/04/2017] aspirin EC  81 mg Oral Daily  . atorvastatin  80 mg Oral q1800  . carvedilol  3.125 mg Oral BID WC  .  sodium chloride flush  3 mL Intravenous Q12H  . sodium chloride flush  3 mL Intravenous Q12H  . sodium chloride flush  3 mL Intravenous Q12H   Continuous Infusions: . sodium chloride    . sodium chloride 10 mL/hr at 03/03/17 0608  . sodium chloride    . sodium chloride 10 mL/hr at 03/03/17 0746  . heparin 1,450 Units/hr (03/03/17 0746)     LOS: 3 days    Time spent: 30 minutes   Noralee Stain, DO [facility] [email or web address] Password [facility] 03/03/2017, 9:28 AM

## 2017-03-03 NOTE — Progress Notes (Signed)
Pt departs to cath lab, heparin gtt stopped, IV saline locked.

## 2017-03-03 NOTE — Progress Notes (Signed)
Page to Dr Alvino Chapelhoi [phone], are you available to review orders on this patient? it seems some orders should be discontinued but were in s/h orders post-cath.

## 2017-03-03 NOTE — Interval H&P Note (Signed)
History and Physical Interval Note:  03/03/2017 9:24 AM  Cath Lab Visit (complete for each Cath Lab visit)  Clinical Evaluation Leading to the Procedure:   ACS: Yes.    Non-ACS:    Anginal Classification: CCS III  Anti-ischemic medical therapy: Minimal Therapy (1 class of medications)  Non-Invasive Test Results: No non-invasive testing performed = ECHO WITH REGIONAL WMA  Prior CABG: No previous CABG  Bryan Lemmaavid Kyrell Ruacho, M.D., M.S. Interventional Cardiologist   Pager # [phone] Phone # [phone] [place], [place] [address]

## 2017-03-03 NOTE — Progress Notes (Signed)
Progress Note  Patient Name: [name] H Terwilliger Date of Encounter: 03/03/2017  Primary Cardiologist: No primary care provider on file.   Subjective   Net 2L diuresis, able to lie flat. No angina Creat 1.2.  Inpatient Medications    Scheduled Meds: . [START ON 03/04/2017] aspirin EC  81 mg Oral Daily  . atorvastatin  80 mg Oral q1800  . carvedilol  3.125 mg Oral BID WC  . sodium chloride flush  3 mL Intravenous Q12H  . sodium chloride flush  3 mL Intravenous Q12H  . sodium chloride flush  3 mL Intravenous Q12H   Continuous Infusions: . sodium chloride    . sodium chloride 10 mL/hr at 03/03/17 0608  . sodium chloride    . sodium chloride 10 mL/hr at 03/03/17 0746  . heparin 1,450 Units/hr (03/03/17 0746)   PRN Meds: sodium chloride, sodium chloride, acetaminophen **OR** acetaminophen, bisacodyl, HYDROcodone-acetaminophen, morphine injection, ondansetron **OR** ondansetron (ZOFRAN) IV, senna-docusate, sodium chloride flush, sodium chloride flush   Vital Signs    Vitals:   03/02/17 0749 03/02/17 1439 03/02/17 1949 03/03/17 0611  BP: 111/81 101/71 118/83 119/76  Pulse: (!) 101 86 88 95  Resp: 18 18 18 18   Temp:  (!) 97.5 F (36.4 C) 97.9 F (36.6 C) 98 F (36.7 C)  TempSrc:  Oral Oral Oral  SpO2: 100% 100% 100% 100%  Weight:    230 lb 6.4 oz (104.5 kg)  Height:        Intake/Output Summary (Last 24 hours) at 03/03/2017 0825 Last data filed at 03/03/2017 0800 Gross per 24 hour  Intake 1206 ml  Output 2100 ml  Net -894 ml   Filed Weights   03/01/17 0645 03/02/17 0446 03/03/17 0611  Weight: 236 lb 14.4 oz (107.5 kg) 236 lb 9.6 oz (107.3 kg) 230 lb 6.4 oz (104.5 kg)    Telemetry    NSR - Personally Reviewed  ECG    No new tracing - Personally Reviewed  Physical Exam  Comfortable GEN: No acute distress.   Neck: No JVD Cardiac: RRR, 3/6 holosystolic apical murmur, no diastolic murmurs, rubs, or gallops.  Respiratory: Clear to auscultation bilaterally. GI:  Soft, nontender, non-distended  MS: No edema; No deformity. Neuro:  Nonfocal  Psych: Normal affect   Labs    Chemistry Recent Labs  Lab 03/01/17 0605 03/02/17 0508 03/03/17 0557  NA 140 140 140  K 4.7 4.6 4.4  CL 106 105 105  CO2 24 24 23   GLUCOSE 96 90 92  BUN 16 16 20   CREATININE 1.34* 1.23 1.19  CALCIUM 9.0 9.0 8.8*  GFRNONAA >60 >60 >60  GFRAA >60 >60 >60  ANIONGAP 10 11 12      Hematology Recent Labs  Lab 03/01/17 0605 03/02/17 0508 03/03/17 0557  WBC 10.4 8.9 8.5  RBC 4.33 4.24 4.34  HGB 12.2* 11.9* 12.0*  HCT 39.0 38.0* 39.0  MCV 90.1 89.6 89.9  MCH 28.2 28.1 27.6  MCHC 31.3 31.3 30.8  RDW 13.9 13.8 13.6  PLT 291 251 242    Cardiac Enzymes Recent Labs  Lab 03/01/17 0848  TROPONINI <0.03    Recent Labs  Lab 02/27/17 2011 02/28/17 0034  TROPIPOC 0.01 0.00     BNPNo results for input(s): BNP, PROBNP in the last 168 hours.   DDimer  Recent Labs  Lab 02/27/17 2224  DDIMER 5.92*     Radiology    No results found.  Cardiac Studies   Echo 03/01/17- Study Conclusions  -  Left ventricle: The cavity size was mildly dilated. Wall thickness was normal. Diffuse hypokinesis with inferolateral akinesis. Systolic function was severely reduced. The estimated ejection fraction was in the range of 25% to 30%. Doppler parameters are consistent with restrictive physiology, indicative of decreased left ventricular diastolic compliance and/or increased left atrial pressure. - Ventricular septum: D-shaped interventricular septum suggestive of RV pressure/volume overload. - Aortic valve: There was no stenosis. - Mitral valve: There was moderate to severe regurgitation, suspect infarct-related MR given inferolateral akinesis and restriction of posterior leaflet. - Left atrium: The atrium was mildly dilated. - Right ventricle: Poorly visualized. The cavity size was normal. Systolic function was mildly reduced. - Right atrium: The  atrium was mildly dilated. - Tricuspid valve: Peak RV-RA gradient (S): 50 mm Hg. - Pulmonary arteries: PA peak pressure: 58 mm Hg (S). - Systemic veins: IVC measured 2.0 cm with < 50% respirophasic variation, suggesting RA pressure 8 mmHg.  Impressions:  - Mildly dilated LV with EF 25-30%, diffuse hypokinesis with inferolateral akinesis. Restrictive diastolic function. RV poorly visualized but appears normal in size with mildly decreased systolic function. D-shaped interventricular septum is suggestive of RV pressure/volume overload. Moderate pulmonary hypertension. Moderate to severe mitral regurgitation, suspect infarct-related MR with restricted posterior leaflet and akinetic inferolateral wall.  CATH 10/20/2015   Prox RCA lesion, 50 %stenosed.  A STENT SYNERGY DES 3X16 drug eluting stent was successfully placed.  Mid Cx lesion, 100 %stenosed.  Post intervention, there is a 0% residual stenosis.  Ost 3rd Mrg to 3rd Mrg lesion, 80 %stenosed.  Post intervention, there is a 10% residual stenosis.  The left ventricular systolic function is normal.  LV end diastolic pressure is normal.  The left ventricular ejection fraction is 50-55% by visual estimate.  There is no mitral valve regurgitation.  Dist LAD lesion, 20 %stenosed.   1. Acute inferolateral STEMI secondary to occluded mid Circumflex artery.  2. Successful PTCA/DES x 1 mid Circumflex 3. Successful balloon angioplasty ostium OM1 4. Moderate non-flow limiting restenosis bare metal stent proximal to mid RCA 5. Preserved LV systolic function  Recommendations: Will admit to CCU. Continue ASA and Brilinta. Start beta blocker, high intensity statin. Echo in am. Would start Lisinopril in am if BP tolerates.   Diagnostic Diagram       Post-Intervention Diagram       Implants     Permanent Stent  Stent Synergy Des 3x16       Patient Profile     42 y.o. male with premature onset CAD,  previous inferior wall myocardial infarction and stent to right coronary artery 2007, previous left circumflex territory infarction and drug-eluting stent  2017, presents with acute systolic heart failure, newly recognized severe left ventricular dysfunction with extensive inferolateral wall motion abnormality moderate to severe mitral insufficiency and moderate pulmonary artery hypertension. Also identified to have a small right lower lobe pulmonary embolism.  Assessment & Plan    1. CHF: better, able to lie supine for cardiac catheterization.  RAAS inhibitors to start after cath. On starting dose carvedilol. 2. CAD: Echo findings suggests complete occlusion of the left circumflex coronary artery with secondary wall motion abnormality and ischemic mitral insufficiency.  Hopefully there will be an option to open that vessel again, also hopefully followed by reduction in MR. 3. MR: Echo appearance is strongly consistent with ischemic posterior leaflet tethering.  If not able to revascularize or no improvement after PCI, he will require high risk mitral valve surgery and bypass, probably ICD. 4.  PE: His clinical presentation is much more suggestive of heart failure rather than pulmonary embolism, but he will require anticoagulation.  His father did have a history of a "clotting disorder".  Plan to restart Xarelto after the cath/revascularization.  For cardiac cath +/- PCI today. This procedure has been fully reviewed with the patient and written informed consent has been obtained.   For questions or updates, please contact [facility] Please consult [email or web address] for contact info under Cardiology/STEMI.      Signed, Thurmon Fair, MD  03/03/2017, 8:25 AM

## 2017-03-03 NOTE — Progress Notes (Signed)
Pt shaving himself upon entering the room for shift report. Pt reports "nicking" himself while shaving his groin. Pt denies active bleeding.

## 2017-03-03 NOTE — Progress Notes (Signed)
Second withdrawal of 1cc of air from TR band, reveals darkening of puncture site. Will wait 20 minutes for next pull.

## 2017-03-03 NOTE — Interval H&P Note (Signed)
History and Physical Interval Note:  03/03/2017 9:23 AM  Joshua May  has presented today for surgery, with the diagnosis of cp- abnormal Echo with known CAD.   The various methods of treatment have been discussed with the patient and family. After consideration of risks, benefits and other options for treatment, the patient has consented to  Procedure(s): LEFT HEART CATH AND CORONARY ANGIOGRAPHY (N/A) with possible PERCUTANEOUS CORONARY INTERVENTION as a surgical intervention .  The patient's history has been reviewed, patient examined, no change in status, stable for surgery.  I have reviewed the patient's chart and labs.  Questions were answered to the patient's satisfaction.     Bryan Lemmaavid Kendle Turbin

## 2017-03-03 NOTE — Progress Notes (Signed)
Pt arrives to room post-cath. Per cath lab, pt is to follow PCI protocol of 2 hour hold before removing air from TR band. First removal will occur at 1250

## 2017-03-03 NOTE — Progress Notes (Signed)
Entered patient's room to medicate with morning coreg, pt states that a 'doctor in a white coat' just turned off his heparin drip because it was beeping; minutes before this nurse entered the room.  Confirmed with Dr C  (found in dictation room, unaware of who may have been in pt room) and confirmed that pt should in fact still be on heparin gtt.  Drip restarted.   Still unclear who was at bedside with patient.

## 2017-03-03 NOTE — H&P (View-Only) (Signed)
Progress Note  Patient Name: Joshua May Date of Encounter: 03/03/2017  Primary Cardiologist: No primary care provider on file.   Subjective   Net 2L diuresis, able to lie flat. No angina Creat 1.2.  Inpatient Medications    Scheduled Meds: . [START ON 03/04/2017] aspirin EC  81 mg Oral Daily  . atorvastatin  80 mg Oral q1800  . carvedilol  3.125 mg Oral BID WC  . sodium chloride flush  3 mL Intravenous Q12H  . sodium chloride flush  3 mL Intravenous Q12H  . sodium chloride flush  3 mL Intravenous Q12H   Continuous Infusions: . sodium chloride    . sodium chloride 10 mL/hr at 03/03/17 0608  . sodium chloride    . sodium chloride 10 mL/hr at 03/03/17 0746  . heparin 1,450 Units/hr (03/03/17 0746)   PRN Meds: sodium chloride, sodium chloride, acetaminophen **OR** acetaminophen, bisacodyl, HYDROcodone-acetaminophen, morphine injection, ondansetron **OR** ondansetron (ZOFRAN) IV, senna-docusate, sodium chloride flush, sodium chloride flush   Vital Signs    Vitals:   03/02/17 0749 03/02/17 1439 03/02/17 1949 03/03/17 0611  BP: 111/81 101/71 118/83 119/76  Pulse: (!) 101 86 88 95  Resp: 18 18 18 18   Temp:  (!) 97.5 F (36.4 C) 97.9 F (36.6 C) 98 F (36.7 C)  TempSrc:  Oral Oral Oral  SpO2: 100% 100% 100% 100%  Weight:    230 lb 6.4 oz (104.5 kg)  Height:        Intake/Output Summary (Last 24 hours) at 03/03/2017 0825 Last data filed at 03/03/2017 0800 Gross per 24 hour  Intake 1206 ml  Output 2100 ml  Net -894 ml   Filed Weights   03/01/17 0645 03/02/17 0446 03/03/17 0611  Weight: 236 lb 14.4 oz (107.5 kg) 236 lb 9.6 oz (107.3 kg) 230 lb 6.4 oz (104.5 kg)    Telemetry    NSR - Personally Reviewed  ECG    No new tracing - Personally Reviewed  Physical Exam  Comfortable GEN: No acute distress.   Neck: No JVD Cardiac: RRR, 3/6 holosystolic apical murmur, no diastolic murmurs, rubs, or gallops.  Respiratory: Clear to auscultation bilaterally. GI:  Soft, nontender, non-distended  MS: No edema; No deformity. Neuro:  Nonfocal  Psych: Normal affect   Labs    Chemistry Recent Labs  Lab 03/01/17 0605 03/02/17 0508 03/03/17 0557  NA 140 140 140  K 4.7 4.6 4.4  CL 106 105 105  CO2 24 24 23   GLUCOSE 96 90 92  BUN 16 16 20   CREATININE 1.34* 1.23 1.19  CALCIUM 9.0 9.0 8.8*  GFRNONAA >60 >60 >60  GFRAA >60 >60 >60  ANIONGAP 10 11 12      Hematology Recent Labs  Lab 03/01/17 0605 03/02/17 0508 03/03/17 0557  WBC 10.4 8.9 8.5  RBC 4.33 4.24 4.34  HGB 12.2* 11.9* 12.0*  HCT 39.0 38.0* 39.0  MCV 90.1 89.6 89.9  MCH 28.2 28.1 27.6  MCHC 31.3 31.3 30.8  RDW 13.9 13.8 13.6  PLT 291 251 242    Cardiac Enzymes Recent Labs  Lab 03/01/17 0848  TROPONINI <0.03    Recent Labs  Lab 02/27/17 2011 02/28/17 0034  TROPIPOC 0.01 0.00     BNPNo results for input(s): BNP, PROBNP in the last 168 hours.   DDimer  Recent Labs  Lab 02/27/17 2224  DDIMER 5.92*     Radiology    No results found.  Cardiac Studies   Echo 03/01/17- Study Conclusions  -  Left ventricle: The cavity size was mildly dilated. Wall thickness was normal. Diffuse hypokinesis with inferolateral akinesis. Systolic function was severely reduced. The estimated ejection fraction was in the range of 25% to 30%. Doppler parameters are consistent with restrictive physiology, indicative of decreased left ventricular diastolic compliance and/or increased left atrial pressure. - Ventricular septum: D-shaped interventricular septum suggestive of RV pressure/volume overload. - Aortic valve: There was no stenosis. - Mitral valve: There was moderate to severe regurgitation, suspect infarct-related MR given inferolateral akinesis and restriction of posterior leaflet. - Left atrium: The atrium was mildly dilated. - Right ventricle: Poorly visualized. The cavity size was normal. Systolic function was mildly reduced. - Right atrium: The  atrium was mildly dilated. - Tricuspid valve: Peak RV-RA gradient (S): 50 mm Hg. - Pulmonary arteries: PA peak pressure: 58 mm Hg (S). - Systemic veins: IVC measured 2.0 cm with < 50% respirophasic variation, suggesting RA pressure 8 mmHg.  Impressions:  - Mildly dilated LV with EF 25-30%, diffuse hypokinesis with inferolateral akinesis. Restrictive diastolic function. RV poorly visualized but appears normal in size with mildly decreased systolic function. D-shaped interventricular septum is suggestive of RV pressure/volume overload. Moderate pulmonary hypertension. Moderate to severe mitral regurgitation, suspect infarct-related MR with restricted posterior leaflet and akinetic inferolateral wall.  CATH 10/20/2015   Prox RCA lesion, 50 %stenosed.  A STENT SYNERGY DES 3X16 drug eluting stent was successfully placed.  Mid Cx lesion, 100 %stenosed.  Post intervention, there is a 0% residual stenosis.  Ost 3rd Mrg to 3rd Mrg lesion, 80 %stenosed.  Post intervention, there is a 10% residual stenosis.  The left ventricular systolic function is normal.  LV end diastolic pressure is normal.  The left ventricular ejection fraction is 50-55% by visual estimate.  There is no mitral valve regurgitation.  Dist LAD lesion, 20 %stenosed.   1. Acute inferolateral STEMI secondary to occluded mid Circumflex artery.  2. Successful PTCA/DES x 1 mid Circumflex 3. Successful balloon angioplasty ostium OM1 4. Moderate non-flow limiting restenosis bare metal stent proximal to mid RCA 5. Preserved LV systolic function  Recommendations: Will admit to CCU. Continue ASA and Brilinta. Start beta blocker, high intensity statin. Echo in am. Would start Lisinopril in am if BP tolerates.   Diagnostic Diagram       Post-Intervention Diagram       Implants     Permanent Stent  Stent Synergy Des 3x16       Patient Profile     42 y.o. male with premature onset CAD,  previous inferior wall myocardial infarction and stent to right coronary artery 2007, previous left circumflex territory infarction and drug-eluting stent  2017, presents with acute systolic heart failure, newly recognized severe left ventricular dysfunction with extensive inferolateral wall motion abnormality moderate to severe mitral insufficiency and moderate pulmonary artery hypertension. Also identified to have a small right lower lobe pulmonary embolism.  Assessment & Plan    1. CHF: better, able to lie supine for cardiac catheterization.  RAAS inhibitors to start after cath. On starting dose carvedilol. 2. CAD: Echo findings suggests complete occlusion of the left circumflex coronary artery with secondary wall motion abnormality and ischemic mitral insufficiency.  Hopefully there will be an option to open that vessel again, also hopefully followed by reduction in MR. 3. MR: Echo appearance is strongly consistent with ischemic posterior leaflet tethering.  If not able to revascularize or no improvement after PCI, he will require high risk mitral valve surgery and bypass, probably ICD. 4.  PE: His clinical presentation is much more suggestive of heart failure rather than pulmonary embolism, but he will require anticoagulation.  His father did have a history of a "clotting disorder".  Plan to restart Xarelto after the cath/revascularization.  For cardiac cath +/- PCI today. This procedure has been fully reviewed with the patient and written informed consent has been obtained.   For questions or updates, please contact [facility] Please consult [email or web address] for contact info under Cardiology/STEMI.      Signed, Thurmon Fair, MD  03/03/2017, 8:25 AM

## 2017-03-03 NOTE — Progress Notes (Addendum)
ANTICOAGULATION CONSULT NOTE  Addendum: Patient went for cardiac cath earlier this morning. Cardiology has asked to resume PTA Xarelto with tonights meal. Patient was taking Xarelto 15mg  BID x21 days and will change to 20mg  once daily on 03/24/17. No bleeding issues reported.    Ruben Imony Kimsey Demaree, PharmD Clinical Pharmacist 03/03/2017 12:50 PM   __________________________________________________________________    ANTICOAGULATION CONSULT NOTE  Pharmacy Consult:  Heparin Indication: pulmonary embolus  Allergies  Allergen Reactions  . Shellfish Allergy Anaphylaxis   Patient Measurements: Height: 5\' 11"  (180.3 cm) Weight: 230 lb 6.4 oz (104.5 kg) IBW/kg (Calculated) : 75.3 Heparin Dosing Weight: 98 kg  Vital Signs: Temp: 97.7 F (36.5 C) (02/13 1105) Temp Source: Oral (02/13 1105) BP: 125/80 (02/13 1105) Pulse Rate: 93 (02/13 1105)  Labs: Recent Labs    03/01/17 0605 03/01/17 0848  03/02/17 0508 03/02/17 1125 03/02/17 1946 03/03/17 0557  HGB 12.2*  --   --  11.9*  --   --  12.0*  HCT 39.0  --   --  38.0*  --   --  39.0  PLT 291  --   --  251  --   --  242  APTT  --   --    < > 99* 119* 91* 76*  LABPROT  --   --   --   --   --   --  16.1*  INR  --   --   --   --   --   --  1.31  HEPARINUNFRC  --   --   --  1.92*  --   --  0.66  CREATININE 1.34*  --   --  1.23  --   --  1.19  TROPONINI  --  <0.03  --   --   --   --   --    < > = values in this interval not displayed.   Assessment: 42 year old male with complaint of epigastric pain and SOB with exertion and lying down.  CT revealed small pulmonary infarct suspected at the right lung base raising concern for mild mostly resolved pulmonary embolus.  Patient was transitioned to Xarelto from Lovenox.  Cards plan for cath today at 1600 to rule out occluded CFx and Pharmacy consulted to transition patient to IV heparin.  BMET and CBC stable; no bleeding reported.  APTT remains therapeutic:76 seconds.  No overt bleeding or  complications noted.  Goal of Therapy:  Heparin level 0.3-0.7 units/ml aPTT 66 - 102 seconds Monitor platelets by anticoagulation protocol: Yes   Plan:  Continue heparin gtt at 1450 units/hr Daily CBC and heparin level while on heparin Monitor for s/sx of bleeding F/u anticoagulation post cath  Ruben Imony Ximenna Fonseca, PharmD Clinical Pharmacist 03/03/2017 11:25 AM

## 2017-03-04 ENCOUNTER — Encounter (HOSPITAL_COMMUNITY): Payer: Self-pay | Admitting: Thoracic Surgery (Cardiothoracic Vascular Surgery)

## 2017-03-04 DIAGNOSIS — I2510 Atherosclerotic heart disease of native coronary artery without angina pectoris: Secondary | ICD-10-CM

## 2017-03-04 DIAGNOSIS — I340 Nonrheumatic mitral (valve) insufficiency: Secondary | ICD-10-CM | POA: Diagnosis present

## 2017-03-04 DIAGNOSIS — I5022 Chronic systolic (congestive) heart failure: Secondary | ICD-10-CM

## 2017-03-04 LAB — CBC
HCT: 39 % (ref 39.0–52.0)
HEMOGLOBIN: 12.4 g/dL — AB (ref 13.0–17.0)
MCH: 28.1 pg (ref 26.0–34.0)
MCHC: 31.8 g/dL (ref 30.0–36.0)
MCV: 88.2 fL (ref 78.0–100.0)
Platelets: 293 10*3/uL (ref 150–400)
RBC: 4.42 MIL/uL (ref 4.22–5.81)
RDW: 13.7 % (ref 11.5–15.5)
WBC: 9 10*3/uL (ref 4.0–10.5)

## 2017-03-04 LAB — BASIC METABOLIC PANEL
ANION GAP: 13 (ref 5–15)
BUN: 20 mg/dL (ref 6–20)
CALCIUM: 9.3 mg/dL (ref 8.9–10.3)
CO2: 26 mmol/L (ref 22–32)
Chloride: 102 mmol/L (ref 101–111)
Creatinine, Ser: 1.35 mg/dL — ABNORMAL HIGH (ref 0.61–1.24)
GFR calc non Af Amer: >60 mL/min (ref >60)
Glucose, Bld: 93 mg/dL (ref 65–99)
POTASSIUM: 3.7 mmol/L (ref 3.5–5.1)
Sodium: 141 mmol/L (ref 135–145)

## 2017-03-04 MED ORDER — SPIRONOLACTONE 12.5 MG HALF TABLET
12.5000 mg | ORAL_TABLET | Freq: Every day | ORAL | Status: DC
  Administered 2017-03-04: 12.5 mg via ORAL
  Filled 2017-03-04 (×2): qty 1

## 2017-03-04 MED ORDER — FUROSEMIDE 40 MG PO TABS
40.0000 mg | ORAL_TABLET | Freq: Every day | ORAL | Status: DC
  Administered 2017-03-05: 40 mg via ORAL
  Filled 2017-03-04: qty 1

## 2017-03-04 MED FILL — Heparin Sodium (Porcine) 2 Unit/ML in Sodium Chloride 0.9%: INTRAMUSCULAR | Qty: 1000 | Status: AC

## 2017-03-04 NOTE — Progress Notes (Signed)
Heart Failure Navigator Consult Note  Presentation: per Dr. Shirlee Latch:  Joshua May is a 42 y.o.maleAA male with a history of CAD dating back to 2007 when he had a PCI in [place]. In 2010 he presented with a STEMI and had an RCA DES placed. He followed up for a year but then didn't present again till Oct 2017 when he presented with a CFX infarct. Cath revealed 50% ISR of the RCA, total mCFX and 85% OM3. He had CFX PCI with DES and OM3 POBA. His EF then was 50-55%.   He presented to [facility] on 02/27/17 with abdominal pain and SOB. Was admitted for a small RLL PE and was found to have worsened cardiomyopathy and CHF. Pertinent admission labs include Cr 1.32, K 4.2. Had been off all medications for 7 months due to finances. SOB began in 11/18 and continually worsened. PTA pt was chronically orthopneic and experiencing SOB intermittently at rest.    Patient feels "better" today. Has a good appetite and wants to increase activity. Sleeps on an incline due to chronic orthopnea. Denies SOB at rest, chest pain, abdominal pain, dizziness, and lower extremity pain or swelling. Current tobacco use.  Past Medical History:  Diagnosis Date  . Asthma   . CAD (coronary artery disease)   . Chronic systolic CHF (congestive heart failure) (HCC)   . HLD (hyperlipidemia)    Qualifier: Diagnosis of  By: Antoine Poche, MD, Gerrit Heck    . Hypercholesteremia   . Inferior myocardial infarction [facility]) 2007   PCI of RCA  . Inferior myocardial infarction (HCC) 03/31/2008   PCI of RCA with bare metal stent  . Ischemic cardiomyopathy   . Mitral regurgitation   . Obstructive sleep apnea    Qualifier: Diagnosis of  By: Antoine Poche, MD, Gerrit Heck    . 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 Pulmonary embolism (HCC) 02/28/2017  . Tobacco abuse     Social History   Socioeconomic History  . Marital status: Married    Spouse name: None  . Number of children: None  . Years of education:  None  . Highest education level: None  Social Needs  . Financial resource strain: None  . Food insecurity - worry: None  . Food insecurity - inability: None  . Transportation needs - medical: None  . Transportation needs - non-medical: None  Occupational History  . None  Tobacco Use  . Smoking status: Former Smoker    Packs/day: 0.50    Years: 17.00    Pack years: 8.50    Types: Cigarettes  . Smokeless tobacco: Never Used  . Tobacco comment: quit since heart attack in 10/2015  Substance and Sexual Activity  . Alcohol use: Yes    Alcohol/week: 4.8 oz    Types: 8 Shots of liquor per week  . Drug use: Yes    Types: Marijuana  . Sexual activity: None  Other Topics Concern  . None  Social History Narrative   The patient is married with two children.    ECHO:Study Conclusions-03/01/17  - Left ventricle: The cavity size was mildly dilated. Wall   thickness was normal. Diffuse hypokinesis with inferolateral   akinesis. Systolic function was severely reduced. The estimated   ejection fraction was in the range of 25% to 30%. Doppler   parameters are consistent with restrictive physiology, indicative   of decreased left ventricular diastolic compliance and/or   increased left atrial pressure. - Ventricular septum: D-shaped  interventricular septum suggestive   of RV pressure/volume overload. - Aortic valve: There was no stenosis. - Mitral valve: There was moderate to severe regurgitation, suspect   infarct-related MR given inferolateral akinesis and restriction   of posterior leaflet. - Left atrium: The atrium was mildly dilated. - Right ventricle: Poorly visualized. The cavity size was normal.   Systolic function was mildly reduced. - Right atrium: The atrium was mildly dilated. - Tricuspid valve: Peak RV-RA gradient (S): 50 mm Hg. - Pulmonary arteries: PA peak pressure: 58 mm Hg (S). - Systemic veins: IVC measured 2.0 cm with < 50% respirophasic   variation, suggesting RA  pressure 8 mmHg.  Impressions:  - Mildly dilated LV with EF 25-30%, diffuse hypokinesis with   inferolateral akinesis. Restrictive diastolic function. RV poorly   visualized but appears normal in size with mildly decreased   systolic function. D-shaped interventricular septum is suggestive   of RV pressure/volume overload. Moderate pulmonary hypertension.   Moderate to severe mitral regurgitation, suspect infarct-related   MR with restricted posterior leaflet and akinetic inferolateral   wall.  BNP No results found for: BNP  ProBNP No results found for: PROBNP   Education Assessment and Provision:  Detailed education and instructions provided on heart failure disease management including the following:  Signs and symptoms of Heart Failure When to call the physician Importance of daily weights Low sodium diet Fluid restriction Medication management Anticipated future follow-up appointments  Patient education given on each of the topics.  Patient acknowledges understanding and acceptance of all instructions.  I spoke with Mr Ellery PlunkBest regarding his current hospitalization and HF diagnosis.  I reviewed the importance of daily weights and when to contact the physician related to his signs and symptoms.  I also reviewed a low sodium diet as well as high sodium foods to avoid.  He says that he currently has no insurance and that affording medications at the time of discharge will be an issue.  I informed him that we can assist with his HF medications at the time of discharge.  We will send his prescriptions to the [facility] to be filled through the HF Fund.  He does say that he has transportation and can pick up those medications after he is discharged.  He will follow in the AHF clinic.  I have given him directions and a map to get to the Outpatient Pharmacy as well as HF Clinic.  Education Materials:  "Living Better With Heart Failure" Booklet, Daily Weight Tracker Tool    High Risk Criteria for Readmission and/or Poor Patient Outcomes:  (Recommend Follow-up with Advanced Heart Failure Clinic)--yes   EF <30%- Yes 25-30%   2 or more admissions in 6 months-No  Difficult social situation- Yes -no insurance noted  Demonstrates medication noncompliance- yes    Barriers of Care:  Knowledge, compliance and financial  Discharge Planning:   [place]

## 2017-03-04 NOTE — Progress Notes (Addendum)
Progress Note  Patient Name: Joshua May Date of Encounter: 03/04/2017  Primary Cardiologist: Dr Antoine PocheHochrein  Subjective   No SOB  Inpatient Medications    Scheduled Meds: . aspirin EC  81 mg Oral Daily  . atorvastatin  80 mg Oral q1800  . carvedilol  3.125 mg Oral BID WC  . furosemide  40 mg Intravenous BID  . losartan  25 mg Oral Daily  . rivaroxaban  15 mg Oral BID WC   Followed by  . [START ON 03/24/2017] rivaroxaban  20 mg Oral Daily   Continuous Infusions:  PRN Meds: acetaminophen **OR** acetaminophen, bisacodyl, HYDROcodone-acetaminophen, morphine injection, ondansetron **OR** ondansetron (ZOFRAN) IV, senna-docusate   Vital Signs    Vitals:   03/03/17 1956 03/04/17 0230 03/04/17 0455 03/04/17 0824  BP: (!) 92/55 101/69 (!) 93/54 (!) 99/54  Pulse: 82  79 74  Resp: 18  18   Temp: (!) 97.5 F (36.4 C)  98.1 F (36.7 C) 98.6 F (37 C)  TempSrc: Oral  Oral Oral  SpO2: 98%  98% 96%  Weight:   222 lb 4.8 oz (100.8 kg)   Height:        Intake/Output Summary (Last 24 hours) at 03/04/2017 0834 Last data filed at 03/04/2017 0300 Gross per 24 hour  Intake 920 ml  Output 2875 ml  Net -1955 ml   Filed Weights   03/02/17 0446 03/03/17 0611 03/04/17 0455  Weight: 236 lb 9.6 oz (107.3 kg) 230 lb 6.4 oz (104.5 kg) 222 lb 4.8 oz (100.8 kg)    Telemetry    NSR - Personally Reviewed  ECG    No new - Personally Reviewed  Physical Exam   GEN: No acute distress.   Neck: No JVD Cardiac: RRR, 2/6 MR murmur, no rubs, or gallops.  Respiratory: Clear to auscultation bilaterally. GI: Soft, nontender, non-distended  MS: No edema; No deformity. Neuro:  Nonfocal  Psych: Normal affect   Labs    Chemistry Recent Labs  Lab 03/02/17 0508 03/03/17 0557 03/04/17 0643  NA 140 140 141  K 4.6 4.4 3.7  CL 105 105 102  CO2 24 23 26   GLUCOSE 90 92 93  BUN 16 20 20   CREATININE 1.23 1.19 1.35*  CALCIUM 9.0 8.8* 9.3  GFRNONAA >60 >60 >60  GFRAA >60 >60 >60  ANIONGAP  11 12 13      Hematology Recent Labs  Lab 03/02/17 0508 03/03/17 0557 03/04/17 0643  WBC 8.9 8.5 9.0  RBC 4.24 4.34 4.42  HGB 11.9* 12.0* 12.4*  HCT 38.0* 39.0 39.0  MCV 89.6 89.9 88.2  MCH 28.1 27.6 28.1  MCHC 31.3 30.8 31.8  RDW 13.8 13.6 13.7  PLT 251 242 293    Cardiac Enzymes Recent Labs  Lab 03/01/17 0848  TROPONINI <0.03    Recent Labs  Lab 02/27/17 2011 02/28/17 0034  TROPIPOC 0.01 0.00     BNPNo results for input(s): BNP, PROBNP in the last 168 hours.   DDimer  Recent Labs  Lab 02/27/17 2224  DDIMER 5.92*     Radiology    CXR 02/27/17- CHEST  2 VIEW  COMPARISON:  03/31/2008  FINDINGS: Enlargement of cardiac silhouette.  Mediastinal contours and pulmonary vascularity normal.  Minimal chronic peribronchial thickening.  Tiny RIGHT pleural effusion.  No acute infiltrate, LEFT pleural effusion or pneumothorax.  Bones unremarkable.  IMPRESSION: Enlargement of cardiac silhouette.  Minimal chronic bronchitic changes and tiny RIGHT pleural effusion without acute infiltrate.  Cardiac Studies  Cath 03/03/17-  Previously placed Prox Cx to Mid Cx stent (unknown type) is widely patent.  Prox Cx lesion is 45% stenosed. - proximal to the Stent -- FFR 0.88.  Prox RCA to Mid RCA stent is 55% stenosed.  Dist LAD lesion is 20% stenosed.  LV end diastolic pressure is severely elevated. 30-31 mmHg   Angiographically moderate lesion prior to the circumflex stent which is likely just a step up.  FFR 0.88.  Otherwise minimal CAD.  No change to the in-stent restenosis of the RCA.  Severely elevated EDP, likely related to cardiomyopathy and mitral regurgitation.  Plan: Medical management and consider possibility of mitral valve repair.   Echo 03/01/17- Study Conclusions  - Left ventricle: The cavity size was mildly dilated. Wall   thickness was normal. Diffuse hypokinesis with inferolateral   akinesis. Systolic function was severely  reduced. The estimated   ejection fraction was in the range of 25% to 30%. Doppler   parameters are consistent with restrictive physiology, indicative   of decreased left ventricular diastolic compliance and/or   increased left atrial pressure. - Ventricular septum: D-shaped interventricular septum suggestive   of RV pressure/volume overload. - Aortic valve: There was no stenosis. - Mitral valve: There was moderate to severe regurgitation, suspect   infarct-related MR given inferolateral akinesis and restriction   of posterior leaflet. - Left atrium: The atrium was mildly dilated. - Right ventricle: Poorly visualized. The cavity size was normal.   Systolic function was mildly reduced. - Right atrium: The atrium was mildly dilated. - Tricuspid valve: Peak RV-RA gradient (S): 50 mm Hg. - Pulmonary arteries: PA peak pressure: 58 mm Hg (S). - Systemic veins: IVC measured 2.0 cm with < 50% respirophasic   variation, suggesting RA pressure 8 mmHg.  Impressions:  - Mildly dilated LV with EF 25-30%, diffuse hypokinesis with   inferolateral akinesis. Restrictive diastolic function. RV poorly   visualized but appears normal in size with mildly decreased   systolic function. D-shaped interventricular septum is suggestive   of RV pressure/volume overload. Moderate pulmonary hypertension.   Moderate to severe mitral regurgitation, suspect infarct-related   MR with restricted posterior leaflet and akinetic inferolateral   wall.   Patient Profile     42 y.o. male AA male with a history of CAD dating back to 2007 when he had a PCI in [place]. In 2010 he presented with a STEMI and had an RCA DES placed. He followed up for a year but then didn't present again till Oct 2017 when he presented with a CFX infarct. Cath revealed 50% ISR of the RCA, total mCFX and 85% OM3. He had CFX PCI with DES and OM3 POBA. His EF then was 50-55%. He presents now with a small RLL PE and was found to have new  cardiomyopathy and CHF-off all medications for several months secondary to finances.    Assessment & Plan     Ischemic cardiomyopathy- new drop in EF with moderate to severe MR.   Acute CHF- I/O negative 3.8L, wgt down 14 lb. He still had elevated LVEDP at cath but is improved symptomatically.  CAD-s/p PCI 2007, RCA DES 2010, CFX DES and OM3 POBA Oct 2017.-patent stents at cath 03/03/17.  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 Xarelto started  Dyslipidemia-LDL was 141 in Oct 2017  Plan: B/P is borderline low and SCr  is now 1.35, (GFR still > 60) doubt we can push medication further. Change Lasix to PO after this mornings IV dose and decrease to 40 mg daily. Consider Entresto as an OP if he can demonstrate compliance, and we could get him assistance for it.    ? DC later today with plans for medical RX. Will discuss timing of surgical consult with MD. He will need TOC OV in one week after discharge.    For questions or updates, please contact [facility] Please consult [email or web address] for contact info under Cardiology/STEMI.      Signed, Corine Shelter, PA-C  03/04/2017, 8:34 AM    I have seen and examined the patient along with Corine Shelter, PA.  I have reviewed the chart, notes and new data.  I agree with PA's note.  Key new complaints: Dyspnea has resolved completely.  Net diuresis more than 4 L, weight down 14 pounds in the last 48 hours. he has noticed that urine output has begun to diminish even with IV diuretics. Key examination changes: Clear lungs, no gallop, no JVD, no edema, 3/6 holosystolic apical murmur radiating to the axilla Key new findings / data: Creatinine suddenly increased to 1.35.  Potassium is 3.7  PLAN: Switch to p.o. Diuretics. Gradually titrate ARB and carvedilol. Evaluation by heart failure team and Dr. Cornelius Moras. Reevaluate severity of  MR after reaching maximal medical therapy. Might be ready for discharge tomorrow pending on renal function and fluid status response to transition to oral diuretics.   Thurmon Fair, MD, [facility] [phone] 03/04/2017, 10:33 AM

## 2017-03-04 NOTE — Consult Note (Signed)
Advanced Heart Failure Team Consult Note   Primary Physician: Patient, No Pcp Per PCP-Cardiologist:  Rollene RotundaJames Hochrein, MD  Reason for Consultation: Acute on chronic systolic HF  HPI:    [name] for evaluation of acute on chronic systolic HF at the request of Dr. Cornelius Moraswen.  [name] is a 42 y.o. male AA male with a history of CAD dating back to 2007 when he had a [place] in [place]. In 2010 he presented with a STEMI and had an RCA DES placed. He followed up for a year but then didn't present again till Oct 2017 when he presented with a CFX infarct. Cath revealed 50% ISR of the RCA, total mCFX and 85% OM3. He had CFX PCI with DES and OM3 POBA. His EF then was 50-55%.   [facility] with abdominal pain and SOB. Was admitted for a small RLL PE and was found to have worsened cardiomyopathy and CHF. Pertinent admission labs include Cr 1.32, K 4.2. Had been off all medications for 7 months due to finances. SOB began in 11/18 and continually worsened. PTA pt was chronically orthopneic and experiencing SOB intermittently at rest.    Patient feels "better" today. Has a good appetite and wants to increase activity. Sleeps on an incline due to chronic orthopnea. Denies SOB at rest, chest pain, abdominal pain, dizziness, and lower extremity pain or swelling. Current tobacco use.  CT Angio chest (02/28/17): small pulmonary infarct at R lung base, raising concern for mild mostly resolved PE. Small R pleural effusion, borderline cardiomegaly. CXR (02/27/17): Enlarged cardiac silhouette, minimal chronic bronchitic changes and tiny R pleural effusion without acute infiltrate EKG (02/27/17): NSR, HR 95 bpm LHC (03/03/17): Moderate lesion prior to circumflex, likely just a step up???, Otherwise minimal CAD.  Echo (10/17): EF 40-45%, diffuse HK, Grade 2 DD, mild MR Echo (2/19): EF 25-30%, diffuse HK with infolateral akinesis, D shaped interventricular septum, mod to severe MR (suspect  infarct-related), mild LAE, mild RAE, PA 58 mmHg  Review of systems except as above in HPI.  Review of Systems: [y] = yes, [ ]  = no   General: Weight gain [ ] ; Weight loss Cove.Etienne[y ]; Anorexia [ ] ; Fatigue [ ] ; Fever [ ] ; Chills [ ] ; Weakness [ ]   Cardiac: Chest pain/pressure [ ] ; Resting SOB [ ] ; Exertional SOB [ ] ; Orthopnea Cove.Etienne[y ]; Pedal Edema [ ] ; Palpitations [ ] ; Syncope [ ] ; Presyncope [ ] ; Paroxysmal nocturnal dyspnea[ ]   Pulmonary: Cough [ ] ; Wheezing[ ] ; Hemoptysis[ ] ; Sputum [ ] ; Snoring [ ]   GI: Vomiting[ ] ; Dysphagia[ ] ; Melena[ ] ; Hematochezia [ ] ; Heartburn[ ] ; Abdominal pain [ ] ; Constipation [ ] ; Diarrhea [ ] ; BRBPR [ ]   GU: Hematuria[ ] ; Dysuria [ ] ; Nocturia[ ]   Vascular: Pain in legs with walking [ ] ; Pain in feet with lying flat [ ] ; Non-healing sores [ ] ; Stroke [ ] ; TIA [ ] ; Slurred speech [ ] ;  Neuro: Headaches[ ] ; Vertigo[ ] ; Seizures[ ] ; Paresthesias[ ] ;Blurred vision [ ] ; Diplopia [ ] ; Vision changes [ ]   Ortho/Skin: Arthritis [ ] ; Joint pain [ ] ; Muscle pain [ ] ; Joint swelling [ ] ; Back Pain [ ] ; Rash [ ]   Psych: Depression[ ] ; Anxiety[ ]   Heme: Bleeding problems [ ] ; Clotting disorders [ ] ; Anemia [ ]   Endocrine: Diabetes [ ] ; Thyroid dysfunction[ ]   Home Medications Prior to Admission medications   Medication Sig Start Date End Date Taking? Authorizing  Provider  aspirin EC 81 MG tablet Take 1 tablet (81 mg total) by mouth daily. 10/22/15  Yes Little Ishikawa, NP  nitroGLYCERIN (NITROSTAT) 0.4 MG SL tablet Place 1 tablet (0.4 mg total) under the tongue every 5 (five) minutes as needed for chest pain. 10/22/15  Yes Little Ishikawa, NP  atorvastatin (LIPITOR) 80 MG tablet Take 1 tablet (80 mg total) by mouth daily. Patient not taking: Reported on 02/27/2017 11/01/15   Azalee Course, PA  lisinopril (PRINIVIL,ZESTRIL) 10 MG tablet Take 1 tablet (10 mg total) by mouth daily. Patient not taking: Reported on 02/27/2017 11/01/15   Azalee Course, PA  metoprolol tartrate (LOPRESSOR) 25 MG tablet  Take 1.5 tablets (37.5 mg total) by mouth 2 (two) times daily. Patient not taking: Reported on 02/27/2017 12/24/15   Azalee Course, PA  ticagrelor (BRILINTA) 90 MG TABS tablet Take 1 tablet (90 mg total) by mouth 2 (two) times daily. Patient not taking: Reported on 02/27/2017 10/22/15   Little Ishikawa, NP    Past Medical History: Past Medical History:  Diagnosis Date  . Asthma   . 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 NO SIGNIFICANT CAD IN THE LEFT MAIN,LAD, OR LEFT CIRCUMFLEX. MILD SEGMENTAL LV DYSFUNCTION WITH OVERALL PRESERVED LVEF. SUCCESSFUL PCI USING A SINGLE BARE-METAL STENT. b. STEMI 10/2015 STENT SYNERGY DES 3X16  to Circ.  Marland Kitchen Hypercholesteremia   . Tobacco abuse     Past Surgical History: Past Surgical History:  Procedure Laterality Date  . CARDIAC CATHETERIZATION N/A 10/20/2015   Procedure: Left Heart Cath and Coronary Angiography;  Surgeon: Kathleene Hazel, MD;  Location[facility] INVASIVE CV LAB;  Service: Cardiovascular;  Laterality: N/A;  . CARDIAC CATHETERIZATION N/A 10/20/2015   Procedure: Coronary Stent Intervention;  Surgeon: Kathleene Hazel, MD;  Location: [facility] INVASIVE CV LAB;  Service: Cardiovascular;  Laterality: N/A;  . INTRAVASCULAR PRESSURE WIRE/FFR STUDY N/A 03/03/2017   Procedure: INTRAVASCULAR PRESSURE WIRE/FFR STUDY;  Surgeon: Marykay Lex, MD;  Location: [facility] INVASIVE CV LAB;  Service: Cardiovascular;  Laterality: N/A;  . LEFT HEART CATH AND CORONARY ANGIOGRAPHY N/A 03/03/2017   Procedure: LEFT HEART CATH AND CORONARY ANGIOGRAPHY;  Surgeon: Marykay Lex, MD;  Location: [facility] INVASIVE CV LAB;  Service: Cardiovascular;  Laterality: N/A;  . none    . OTHER SURGICAL HISTORY     NONE    Family History: Family History  Problem Relation Age of Onset  . Other Sister 40       THE PATIENT REPORTS A SISTER WITH A MYOCARDIAL INFARCTION IN HER 30s.(SHE IS NOW S/P CABG)  . Heart attack Father   . Deep vein thrombosis Father      Social History: Social History   Socioeconomic History  . Marital status: Married    Spouse name: None  . Number of children: None  . Years of education: None  . Highest education level: None  Social Needs  . Financial resource strain: None  . Food insecurity - worry: None  . Food insecurity - inability: None  . Transportation needs - medical: None  . Transportation needs - non-medical: None  Occupational History  . None  Tobacco Use  . Smoking status: Former Smoker    Packs/day: 0.50    Years: 17.00    Pack years: 8.50    Types: Cigarettes  . Smokeless tobacco: Never Used  . Tobacco comment: quit since heart attack in 10/2015  Substance and Sexual Activity  . Alcohol  use: Yes    Alcohol/week: 4.8 oz    Types: 8 Shots of liquor per week  . Drug use: Yes    Types: Marijuana  . Sexual activity: None  Other Topics Concern  . None  Social History Narrative   The patient is married with two children.    Allergies:  Allergies  Allergen Reactions  . Shellfish Allergy Anaphylaxis    Objective:    Vital Signs:   Temp:  [97.5 F (36.4 C)-98.6 F (37 C)] 98.6 F (37 C) (02/14 0824) Pulse Rate:  [66-93] 74 (02/14 0824) Resp:  [12-23] 18 (02/14 0455) BP: (92-126)/(54-89) 99/54 (02/14 0824) SpO2:  [95 %-100 %] 96 % (02/14 0824) Weight:  [222 lb 4.8 oz (100.8 kg)] 222 lb 4.8 oz (100.8 kg) (02/14 0455) Last BM Date: 03/03/17  Weight change: Filed Weights   03/02/17 0446 03/03/17 0611 03/04/17 0455  Weight: 236 lb 9.6 oz (107.3 kg) 230 lb 6.4 oz (104.5 kg) 222 lb 4.8 oz (100.8 kg)    Intake/Output:   Intake/Output Summary (Last 24 hours) at 03/04/2017 1012 Last data filed at 03/04/2017 1000 Gross per 24 hour  Intake 1396 ml  Output 3625 ml  Net -2229 ml      Physical Exam    General:  Well appearing. No resp difficulty HEENT: normal Neck: supple. JVP 8. Carotids 2+ bilat; no bruits. No lymphadenopathy or thyromegaly appreciated. Cor: PMI  nondisplaced. Regular rate & rhythm. 1/6 HSM apex. No rubs, gallops.  Lungs: clear bilaterally Abdomen: soft, nontender, nondistended. No hepatosplenomegaly. No bruits or masses. Good bowel sounds. Extremities: no cyanosis, clubbing, rash, edema Neuro: alert & orientedx3, cranial nerves grossly intact. moves all 4 extremities w/o difficulty. Affect pleasant   Telemetry   NSR, 80  EKG    No new tracings.   Labs   Basic Metabolic Panel: Recent Labs  Lab 02/28/17 0234 03/01/17 0605 03/02/17 0508 03/03/17 0557 03/04/17 0643  NA 139 140 140 140 141  K 4.2 4.7 4.6 4.4 3.7  CL 107 106 105 105 102  CO2 21* 24 24 23 26   GLUCOSE 114* 96 90 92 93  BUN 14 16 16 20 20   CREATININE 1.33* 1.34* 1.23 1.19 1.35*  CALCIUM 8.9 9.0 9.0 8.8* 9.3    Liver Function Tests: No results for input(s): AST, ALT, ALKPHOS, BILITOT, PROT, ALBUMIN in the last 168 hours. Recent Labs  Lab 02/27/17 1959  LIPASE 27   No results for input(s): AMMONIA in the last 168 hours.  CBC: Recent Labs  Lab 02/28/17 0234 03/01/17 0605 03/02/17 0508 03/03/17 0557 03/04/17 0643  WBC 10.5 10.4 8.9 8.5 9.0  HGB 12.1* 12.2* 11.9* 12.0* 12.4*  HCT 38.5* 39.0 38.0* 39.0 39.0  MCV 89.7 90.1 89.6 89.9 88.2  PLT 267 291 251 242 293    Cardiac Enzymes: Recent Labs  Lab 03/01/17 0848  TROPONINI <0.03    BNP: BNP (last 3 results) No results for input(s): BNP in the last 8760 hours.  ProBNP (last 3 results) No results for input(s): PROBNP in the last 8760 hours.   CBG: No results for input(s): GLUCAP in the last 168 hours.  Coagulation Studies: Recent Labs    03/03/17 0557  LABPROT 16.1*  INR 1.31     Imaging    No results found.   Medications:     Current Medications: . aspirin EC  81 mg Oral Daily  . atorvastatin  80 mg Oral q1800  . carvedilol  3.125 mg Oral  BID WC  . [START ON 03/05/2017] furosemide  40 mg Oral Daily  . losartan  25 mg Oral Daily  . rivaroxaban  15 mg Oral BID  WC   Followed by  . [START ON 03/24/2017] rivaroxaban  20 mg Oral Daily     Infusions:     Patient Profile   Joshua May is a 42 y.o. male AA male with a history of CAD dating back to 2007 when he had a PCI in [place]. In 2010 he presented with a STEMI and had an RCA DES placed. He followed up for a year but then didn't present again till Oct 2017 when he presented with a CFX infarct. Cath revealed 50% ISR of the RCA, total mCFX and 85% OM3. He had CFX PCI with DES and OM3 POBA. His EF then was 50-55%.    Assessment/Plan   See attending note below.   Length of Stay: 4  Frances Furbish, [place] Neighbor  03/04/2017, 10:12 AM  Advanced Heart Failure Team Pager [phone] (M-F; 7a - 4p)  Please contact [facility] for night-coverage after hours (4p -7a ) and weekends on [email or web address]  Patient seen with PA, agree with the above note.  Patient has extensive CAD history.  He presented with dyspnea and epigastric/lower chest discomfort that was pleuritic. He was found to have a RLL PE and to be in heart failure.  LHC was done, showing nonobstructive CAD.  He was diuresed and is down > 14 lbs since admission.  Today, he feels well.  Minimal dyspnea but has not been up much.  No further chest/epigastric pain.   On exam, JVP 8 cm, regular rhythm, very soft MR-type murmur, no edema.   1. Acute on chronic diastolic CHF: Ischemic cardiomyopathy with EF 25-30% and inferolateral akinesis, also at least mild RV dysfunction with D-shaped septum (with recent PE).  On exam, he is not significantly volume overloaded at this point.  SBP is soft in the upper 90s-100s.  No lightheadedness.  - Agree with transition to po Lasix, 40 mg po daily.  - Continue Coreg 3.125 mg bid and losartan 25 mg daily.   - Add spironolactone 12.5 daily.   - Will need close outpatient followup for med titration.  We will see him in CHF clinic.  - I suspect he will need an ICD eventually, not CRT candidate.  2. CAD: Patient has extensive  history of CAD with stents in LCx and RCA.  Cath this admission showed nonobstructive disease.  - We will continue statin, use atorvastatin 80 mg daily.  - Given Xarelto use with stable CAD, can stop ASA 81.  3. PE: Small RLL PE.  ?Etiology => no triggers such as recent surgery, sedentary lifestyle, or long car/plan trips.  Interestingly, his father had venous thromboembolism as well so likely a genetic basis.  - Will send Factor V Leiden, prothrombin gene mutation, and Lupus anticoagulant.  If the gene tests are abnormal, this could be helpful for his family.  I think that he will need long-term anticoagulation with untriggered VTE.  4. 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 Interestingly, his murmur is quite soft after extensive diuresis, and suspect MR is responsive to loading conditions.  Would plan medical management for 4-6 weeks and then TEE to assess MR.  If he has severe MR at that point and remains symptomatic, would consider surgical repair (benefit tends to be questionable for surgery with ischemic MR, generally reserve for failure of medical therapy).   Jalexus Brett  Daquisha Clermont 03/04/2017 11:49 AM

## 2017-03-04 NOTE — Progress Notes (Signed)
PROGRESS NOTE    Emanuelle Bastos Chait  ZOX:[id number] DOB: 25-Nov-1975 DOA: 02/27/2017 PCP: Patient, No Pcp Per     Brief Narrative:  Joshua May is a 42 y.o. male with medical history significant for hyperlipidemia, coronary artery disease with stents, and nonadherence with DAPT, now presenting to the emergency department for evaluation of epigastric and chest pain for several days with concomitant exertional dyspnea. Patient reports that the symptoms developed insidiously several days. 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 Denies any personal history of DVT or PE, but reports that his dad had a DVT. In the ED, CTA chest confirmed PE. He was started on lovenox and transitioned to Xarelto. Echocardiogram was completed which revealed worsening EF 25% with diffuse hypokinesis. Cardiology was consulted.   Assessment & Plan:   Principal Problem:   Pulmonary embolism (HCC) Active Problems:   HLD (hyperlipidemia)   CAD (coronary artery disease)   Ischemic cardiomyopathy  Pulmonary embolism -LE doppler negative for DVT -Xarelto   CAD -He had previously undergone BMS in 2010 and DES in October 2017. He was lost to follow up due to insurance issues and stopped Brilinta about 7 months ago along with statin, beta blocker, ACE inhibitor. He is currently on baby aspirin only at home  -Cardiology following  -Heart cath 2/13 showed moderate lesion prior to circumflex stent, minimal CAD. No change to in-stent restenosis RCA    Acute systolic HF, ICM -Echocardiogram with new EF 25-30% with diffuse hypokinesis and inferolateral akinesis -Coreg, ARB, Lasix PO, aldactone  -Await CHF team eval   Severe MR -Await surgical eval   HLD -Restart statin   ?OSA -Need sleep study as outpatient    DVT prophylaxis: Xarelto  Code Status: Full Family Communication: Wife at bedside    Disposition Plan: Pending consultant eval    Consultants:   Cardiology  CHF team  Cardiothoracic surgery   Procedures:   None   Antimicrobials:  Anti-infectives (From admission, onward)   None       Subjective: No complaints of CP SOB   Objective: Vitals:   03/04/17 0230 03/04/17 0455 03/04/17 0824 03/04/17 1100  BP: 101/69 (!) 93/54 (!) 99/54 104/66  Pulse:  79 74 72  Resp:  18    Temp:  98.1 F (36.7 C) 98.6 F (37 C) 98.4 F (36.9 C)  TempSrc:  Oral Oral Oral  SpO2:  98% 96% 99%  Weight:  100.8 kg (222 lb 4.8 oz)    Height:        Intake/Output Summary (Last 24 hours) at 03/04/2017 1142 Last data filed at 03/04/2017 1000 Gross per 24 hour  Intake 1396 ml  Output 3625 ml  Net -2229 ml   Filed Weights   03/02/17 0446 03/03/17 0611 03/04/17 0455  Weight: 107.3 kg (236 lb 9.6 oz) 104.5 kg (230 lb 6.4 oz) 100.8 kg (222 lb 4.8 oz)   Examination: General exam: Appears calm and comfortable  Respiratory system: Clear to auscultation. Respiratory effort normal. Cardiovascular system: S1 & S2 heard, RRR. +systolic murmur  Gastrointestinal system: Abdomen is nondistended, soft and nontender. No organomegaly or masses felt. Normal bowel sounds heard. Central nervous system: Alert and oriented. No focal neurological deficits. Extremities: Symmetric 5 x 5 power. Skin: No rashes, lesions or ulcers Psychiatry: Judgement and insight appear normal. Mood & affect appropriate.    Data Reviewed: I have personally reviewed  following labs and imaging studies  CBC: Recent Labs  Lab 02/28/17 0234 03/01/17 0605 03/02/17 0508 03/03/17 0557 03/04/17 0643  WBC 10.5 10.4 8.9 8.5 9.0  HGB 12.1* 12.2* 11.9* 12.0* 12.4*  HCT 38.5* 39.0 38.0* 39.0 39.0  MCV 89.7 90.1 89.6 89.9 88.2  PLT 267 291 251 242 293   Basic Metabolic Panel: Recent Labs  Lab 02/28/17 0234 03/01/17 0605 03/02/17 0508 03/03/17 0557 03/04/17 0643  NA 139 140 140 140 141  K 4.2 4.7 4.6 4.4  3.7  CL 107 106 105 105 102  CO2 21* 24 24 23 26   GLUCOSE 114* 96 90 92 93  BUN 14 16 16 20 20   CREATININE 1.33* 1.34* 1.23 1.19 1.35*  CALCIUM 8.9 9.0 9.0 8.8* 9.3   GFR: Estimated Creatinine Clearance: 88 mL/min (A) (by C-G formula based on SCr of 1.35 mg/dL (H)). Liver Function Tests: No results for input(s): AST, ALT, ALKPHOS, BILITOT, PROT, ALBUMIN in the last 168 hours. Recent Labs  Lab 02/27/17 1959  LIPASE 27   No results for input(s): AMMONIA in the last 168 hours. Coagulation Profile: Recent Labs  Lab 03/03/17 0557  INR 1.31   Cardiac Enzymes: Recent Labs  Lab 03/01/17 0848  TROPONINI <0.03   BNP (last 3 results) No results for input(s): PROBNP in the last 8760 hours. HbA1C: No results for input(s): HGBA1C in the last 72 hours. CBG: No results for input(s): GLUCAP in the last 168 hours. Lipid Profile: No results for input(s): CHOL, HDL, LDLCALC, TRIG, CHOLHDL, LDLDIRECT in the last 72 hours. Thyroid Function Tests: No results for input(s): TSH, T4TOTAL, FREET4, T3FREE, THYROIDAB in the last 72 hours. Anemia Panel: No results for input(s): VITAMINB12, FOLATE, FERRITIN, TIBC, IRON, RETICCTPCT in the last 72 hours. Sepsis Labs: No results for input(s): PROCALCITON, LATICACIDVEN in the last 168 hours.  No results found for this or any previous visit (from the past 240 hour(s)).     Radiology Studies: Dg Chest 2 View  Result Date: 02/27/2017 CLINICAL DATA:  Epigastric pain for 3 days, shortness of breath when lying down and with exertion for 2 weeks, nausea, vomiting, diarrhea abdominal swelling, history cardiac disease (coronary artery disease post MI and coronary stenting), asthma, former smoker EXAM: CHEST  2 VIEW COMPARISON:  03/31/2008 FINDINGS: Enlargement of cardiac silhouette. Mediastinal contours and pulmonary vascularity normal. Minimal chronic peribronchial thickening. Tiny RIGHT pleural effusion. No acute infiltrate, LEFT pleural effusion or  pneumothorax. Bones unremarkable. IMPRESSION: Enlargement of cardiac silhouette. Minimal chronic bronchitic changes and tiny RIGHT pleural effusion without acute infiltrate. Electronically Signed   By: Ulyses SouthwardMark  Boles M.D.   On: 02/27/2017 20:17   Ct Angio Chest Pe W And/or Wo Contrast  Result Date: 02/28/2017 CLINICAL DATA:  Acute onset of epigastric abdominal pain. Shortness of breath with exertion and when lying down. Elevated D-dimer. EXAM: CT ANGIOGRAPHY CHEST WITH CONTRAST TECHNIQUE: Multidetector CT imaging of the chest was performed using the standard protocol during bolus administration of intravenous contrast. Multiplanar CT image reconstructions and MIPs were obtained to evaluate the vascular anatomy. CONTRAST:  100mL ISOVUE-370 IOPAMIDOL (ISOVUE-370) INJECTION 76% COMPARISON:  Chest radiograph performed 02/27/2017 FINDINGS: Cardiovascular: A small pulmonary infarct is suspected at the right lung base, raising concern for mild mostly resolved pulmonary embolus. No central pulmonary embolus is identified. The heart is borderline enlarged. The thoracic aorta is not well assessed but appears grossly unremarkable. The great vessels are grossly unremarkable in appearance. Mediastinum/Nodes: No mediastinal lymphadenopathy is seen. No pericardial effusion is  identified. The visualized portions of the thyroid gland are unremarkable. No axillary lymphadenopathy is appreciated. Lungs/Pleura: A small right pleural effusion is noted. The hazy wedge-shaped opacity at the right lung base is suspicious for pulmonary infarct. No pneumothorax is seen. The left lung appears clear. No masses are identified. Upper Abdomen: The visualized portions of the liver and spleen are unremarkable. There is reflux of contrast into the hepatic veins and IVC. Musculoskeletal: No acute osseous abnormalities are identified. The visualized musculature is unremarkable in appearance. Review of the MIP images confirms the above findings.  IMPRESSION: 1. Small pulmonary infarct suspected at the right lung base, raising concern for mild mostly resolved pulmonary embolus. 2. Associated small right pleural effusion noted. 3. Borderline cardiomegaly. Critical Value/emergent results were called by telephone at the time of interpretation on 02/28/2017 at 12:44 am to Dr. Wilkie Aye, who verbally acknowledged these results. Electronically Signed   By: Roanna Raider M.D.   On: 02/28/2017 00:47   Vas [place] Lower Extremity Venous (dvt)  Result Date: 02/28/2017  Lower Venous Study Risk Factors: Confirmed PE. Examination Guidelines: A complete evaluation includes B-mode imaging, spectral doppler, color doppler, and power doppler as needed of all accessible portions of each vessel. Bilateral testing is considered an integral part of a complete examination. Limited examinations for reoccurring indications may be performed as noted. The reflux portion of the exam is performed with the patient in reverse Trendelenburg.  Right Venous Findings: +---------+---------------+---------+-----------+----------+-------+          CompressibilityPhasicitySpontaneityPropertiesSummary +---------+---------------+---------+-----------+----------+-------+ CFV      Full           Yes      Yes                          +---------+---------------+---------+-----------+----------+-------+ FV Prox  Full                                                 +---------+---------------+---------+-----------+----------+-------+ FV Mid   Full                                                 +---------+---------------+---------+-----------+----------+-------+ FV DistalFull                                                 +---------+---------------+---------+-----------+----------+-------+ PFV      Full                                                 +---------+---------------+---------+-----------+----------+-------+ POP      Full           Yes      Yes                           +---------+---------------+---------+-----------+----------+-------+ PTV      Full                                                 +---------+---------------+---------+-----------+----------+-------+  PERO     Full                                                 +---------+---------------+---------+-----------+----------+-------+  Left Venous Findings: +---------+---------------+---------+-----------+----------+-------+          CompressibilityPhasicitySpontaneityPropertiesSummary +---------+---------------+---------+-----------+----------+-------+ CFV      Full           Yes      Yes                          +---------+---------------+---------+-----------+----------+-------+ FV Prox  Full                                                 +---------+---------------+---------+-----------+----------+-------+ FV Mid   Full                                                 +---------+---------------+---------+-----------+----------+-------+ FV DistalFull                                                 +---------+---------------+---------+-----------+----------+-------+ PFV      Full                                                 +---------+---------------+---------+-----------+----------+-------+ POP      Full           Yes      Yes                          +---------+---------------+---------+-----------+----------+-------+ PTV      Full                                                 +---------+---------------+---------+-----------+----------+-------+ PERO     Full                                                 +---------+---------------+---------+-----------+----------+-------+    Final Interpretation: Right: There is no evidence of deep vein thrombosis in the lower extremity. No cystic structure found in the popliteal fossa. Left: There is no evidence of deep vein thrombosis in the lower extremity. No cystic structure found  in the popliteal fossa.  *See table(s) above for measurements and observations. Electronically signed by Lemar Livings on 02/28/2017 at 2:00:06 PM.  Final     Scheduled Meds: . atorvastatin  80 mg Oral q1800  . carvedilol  3.125 mg Oral BID WC  . [START ON 03/05/2017] furosemide  40 mg Oral Daily  . losartan  25 mg Oral Daily  . rivaroxaban  15 mg Oral BID WC   Followed by  . [START ON 03/24/2017] rivaroxaban  20 mg Oral Daily  . spironolactone  12.5 mg Oral Daily   Continuous Infusions:    LOS: 4 days    Time spent: 30 minutes   Noralee Stain, DO [facility] [email or web address] Password [facility] 03/04/2017, 11:42 AM

## 2017-03-04 NOTE — Consult Note (Addendum)
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    Joshua May's cardiac history dates back to 2007 when he suffered an acute inferior wall myocardial infarction.  At the time he lived in [place] and he was treated with PCI and stenting of the right coronary artery.  Approximately 10 years ago he moved to [facility] and in March 2010 he suffered another acute inferior wall myocardial infarction involving the right coronary artery.  He was treated with PCI and stenting of the right coronary artery using a bare-metal stent.  He remained stable from a cardiac standpoint until October 2017 when he presented with a third acute myocardial infarction involving acute occlusion of the left circumflex coronary artery.  He was treated with PCI and stenting of the left circumflex coronary artery using a drug eluting stent.  The right coronary artery remained patent at that time with 50% stenosis.  At that time left ventricular ejection fraction was estimated 40-45% and the Joshua May was reported to have mild mitral regurgitation.  The Joshua May states that ever since then he has had symptoms of  exertional shortness of breath.  Symptoms remained quite stable until over the past 2-3 months when he developed considerable progression of shortness of breath.  He began to experience resting shortness of breath and orthopnea.  He has never had any chest pain or chest tightness.  Approximately 1 week ago the Joshua May experienced new onset epigastric abdominal pain.  Pain was described as sharp and constant.  Initially he attributed this to food.  Symptoms persisted over several days, ultimately prompting him to present to the emergency department.  EKGs revealed sinus rhythm without acute ischemic changes.  Serial troponin levels remain negative.  CT angiogram of the chest revealed pulmonary embolus involving the right lower lobe.  Transthoracic echocardiogram revealed severe left ventricular systolic dysfunction with ejection fraction estimated 25-30%.  Left ventricle was mildly dilated.  There was diffuse hypokinesis with inferolateral akinesis.  There was moderate to severe mitral regurgitation.  Attempts to quantify mitral regurgitation revealed ERO reported 0.09 cm corresponding to regurgitant volume estimated 11 mL.  The ventricular septum was D-shaped, consistent with likely right ventricular pressure and volume overload.  The Joshua May subsequently underwent left heart catheterization March 03, 2017.  Previous placed stents in the left circumflex were widely patent.  There was 45% stenosis of the proximal left circumflex coronary artery which was not felt to be hemodynamically significant using FFR.  Stents in the proximal and mid right coronary artery were patent with 55% stenosis.  There was nonobstructive disease in the left anterior descending coronary artery.  Left ventricular end-diastolic pressure was severely elevated, estimated 30-31 mmHg.  Right heart catheterization  was not performed.  Cardiothoracic surgical consultation was requested.  The Joshua May is married and lives locally in [place]  with his wife and 2 teenage children.  He is currently out of work, having previously worked in [employer].  He states that he has been limited by exertional shortness of breath dating back to October 2017.  Symptoms have become considerably worse over the last few months, culminating in this admission.  Prior to admission the Joshua May had occasional symptoms of resting shortness of breath with chronic orthopnea and occasional PND.  He has not had exertional chest pain or chest tightness.  Symptoms of shortness of breath have improved with medical therapy in the hospital.  The abdominal pain which he experienced at the time of admission resolved quickly and has not recurred.  He denies any fevers chills or productive cough.  Past Medical History:  Diagnosis Date  . Asthma   . CAD (coronary artery disease)   . Chronic systolic CHF (congestive heart failure) ([place])   . HLD (hyperlipidemia)    Qualifier: Diagnosis of  By: Percival Spanish, MD, Farrel Gordon    . Hypercholesteremia   . Inferior myocardial infarction [facility]) 2007   PCI of RCA  . Inferior myocardial infarction ([place]) 03/31/2008   PCI of RCA with bare metal stent  . Ischemic cardiomyopathy   . Mitral regurgitation   . Obstructive sleep apnea    Qualifier: Diagnosis of  By: Percival Spanish, MD, Farrel Gordon    . Posterolateral myocardial infarction ([place]) 10/20/2015   PCI of LCx using DES  . Pulmonary embolism ([place]) 02/28/2017  . Tobacco abuse     Past Surgical History:  Procedure Laterality Date  . CARDIAC CATHETERIZATION N/A 10/20/2015   Procedure: Left Heart Cath and Coronary Angiography;  Surgeon: Burnell Blanks, MD;  Location: [facility] [facility];  Service: Cardiovascular;  Laterality: N/A;  . CARDIAC CATHETERIZATION N/A 10/20/2015   Procedure: Coronary Stent Intervention;  Surgeon: Burnell Blanks, MD;  Location: [place] [facility];  Service: Cardiovascular;  Laterality: N/A;  . INTRAVASCULAR PRESSURE WIRE/FFR STUDY N/A  03/03/2017   Procedure: INTRAVASCULAR PRESSURE WIRE/FFR STUDY;  Surgeon: Leonie Man, MD;  Location: [place];  Service: Cardiovascular;  Laterality: N/A;  . LEFT HEART CATH AND CORONARY ANGIOGRAPHY N/A 03/03/2017   Procedure: LEFT HEART CATH AND CORONARY ANGIOGRAPHY;  Surgeon: Leonie Man, MD;  Location: [place] [facility];  Service: Cardiovascular;  Laterality: N/A;  . none    . OTHER SURGICAL HISTORY     NONE    Family History  Problem Relation Age of Onset  . Other Sister 92       THE Joshua May REPORTS A SISTER WITH A MYOCARDIAL INFARCTION IN HER 30s.(SHE IS NOW S/P CABG)  . Heart attack Father   . Deep vein thrombosis Father     Social History   Socioeconomic History  . Marital status: Married    Spouse name: Not on file  . Number of children: Not on file  . Years of education: Not on file  . Highest education level: Not on file  Social Needs  . Financial resource strain: Not on file  . Food insecurity - worry: Not on file  . Food insecurity - inability: Not on file  . Transportation needs - medical: Not on file  . Transportation needs - non-medical: Not on file  Occupational History  . Not on file  Tobacco Use  . Smoking status: Former Smoker  Packs/day: 0.50    Years: 17.00    Pack years: 8.50    Types: Cigarettes  . Smokeless tobacco: Never Used  . Tobacco comment: quit since heart attack in 10/2015  Substance and Sexual Activity  . Alcohol use: Yes    Alcohol/week: 4.8 oz    Types: 8 Shots of liquor per week  . Drug use: Yes    Types: Marijuana  . Sexual activity: Not on file  Other Topics Concern  . Not on file  Social History Narrative   The Joshua May is married with two children.    Prior to Admission medications   Medication Sig Start Date End Date Taking? Authorizing Provider  aspirin EC 81 MG tablet Take 1 tablet (81 mg total) by mouth daily. 10/22/15  Yes Arbutus Leas, NP  nitroGLYCERIN (NITROSTAT) 0.4 MG SL tablet Place 1  tablet (0.4 mg total) under the tongue every 5 (five) minutes as needed for chest pain. 10/22/15  Yes Arbutus Leas, NP  atorvastatin (LIPITOR) 80 MG tablet Take 1 tablet (80 mg total) by mouth daily. Joshua May not taking: Reported on 02/27/2017 11/01/15   Almyra Deforest, PA  lisinopril (PRINIVIL,ZESTRIL) 10 MG tablet Take 1 tablet (10 mg total) by mouth daily. Joshua May not taking: Reported on 02/27/2017 11/01/15   Almyra Deforest, PA  metoprolol tartrate (LOPRESSOR) 25 MG tablet Take 1.5 tablets (37.5 mg total) by mouth 2 (two) times daily. Joshua May not taking: Reported on 02/27/2017 12/24/15   Almyra Deforest, PA  ticagrelor (BRILINTA) 90 MG TABS tablet Take 1 tablet (90 mg total) by mouth 2 (two) times daily. Joshua May not taking: Reported on 02/27/2017 10/22/15   Arbutus Leas, NP    Current Facility-Administered Medications  Medication Dose Route Frequency Provider Last Rate Last Dose  . acetaminophen (TYLENOL) tablet 650 mg  650 mg Oral Q6H PRN Opyd, Ilene Qua, MD   650 mg at 02/28/17 1024   Or  . acetaminophen (TYLENOL) suppository 650 mg  650 mg Rectal Q6H PRN Opyd, Ilene Qua, MD      . atorvastatin (LIPITOR) tablet 80 mg  80 mg Oral q1800 Opyd, Ilene Qua, MD   80 mg at 03/03/17 1650  . bisacodyl (DULCOLAX) EC tablet 5 mg  5 mg Oral Daily PRN Opyd, Ilene Qua, MD      . carvedilol (COREG) tablet 3.125 mg  3.125 mg Oral BID WC Simmons, Brittainy M, PA-C   3.125 mg at 03/04/17 0848  . [START ON 03/05/2017] furosemide (LASIX) tablet 40 mg  40 mg Oral Daily Kilroy, Luke K, [place]      . HYDROcodone-acetaminophen (NORCO/VICODIN) 5-325 MG per tablet 1-2 tablet  1-2 tablet Oral Q4H PRN Opyd, Ilene Qua, MD   2 tablet at 03/02/17 0751  . losartan (COZAAR) tablet 25 mg  25 mg Oral Daily Croitoru, Mihai, MD   25 mg at 03/04/17 1038  . morphine 4 MG/ML injection 4 mg  4 mg Intravenous Q4H PRN Opyd, Ilene Qua, MD      . ondansetron (ZOFRAN) tablet 4 mg  4 mg Oral Q6H PRN Opyd, Ilene Qua, MD       Or  . ondansetron (ZOFRAN) injection  4 mg  4 mg Intravenous Q6H PRN Opyd, Ilene Qua, MD      . Rivaroxaban (XARELTO) tablet 15 mg  15 mg Oral BID WC Croitoru, Mihai, MD   15 mg at 03/04/17 0848   Followed by  . [START ON 03/24/2017] rivaroxaban (XARELTO) tablet 20 mg  20 mg Oral Daily Croitoru, Mihai, MD      . senna-docusate (Senokot-S) tablet 1 tablet  1 tablet Oral QHS PRN Opyd, Ilene Qua, MD      . spironolactone (ALDACTONE) tablet 12.5 mg  12.5 mg Oral Daily Larey Dresser, MD   12.5 mg at 03/04/17 1254    Allergies  Allergen Reactions  . Shellfish Allergy Anaphylaxis      Review of Systems:   General:  normal appetite, normal energy, no weight gain, no weight loss, no fever  Cardiac:  no chest pain with exertion, no chest pain at rest, + SOB with exertion, + resting SOB, + PND, + orthopnea, no palpitations, no arrhythmia, no atrial fibrillation, no LE edema, no dizzy spells, no syncope  Respiratory:  + shortness of breath, no home oxygen, no productive cough, no dry cough, no bronchitis, no wheezing, no hemoptysis, no asthma, no pain with inspiration or cough, + sleep apnea, no CPAP at night  GI:   no difficulty swallowing, no reflux, no frequent heartburn, no hiatal hernia, + abdominal pain, no constipation, no diarrhea, no hematochezia, no hematemesis, no melena  GU:   no dysuria,  no frequency, no urinary tract infection, no hematuria, no enlarged prostate, no kidney stones, no kidney disease  Vascular:  no pain suggestive of claudication, no pain in feet, no leg cramps, no varicose veins, no DVT, no non-healing foot ulcer  Neuro:   no stroke, no TIA's, no seizures, no headaches, no temporary blindness one eye,  no slurred speech, no peripheral neuropathy, no chronic pain, no instability of gait, no memory/cognitive dysfunction  Musculoskeletal: no arthritis, no joint swelling, no myalgias, no difficulty walking, normal mobility   Skin:   no rash, no itching, no skin infections, no pressure sores or  ulcerations  Psych:   no anxiety, no depression, no nervousness, no unusual recent stress  Eyes:   no blurry vision, no floaters, no recent vision changes, does not wears glasses or contacts  ENT:   no hearing loss, no loose or painful teeth but one broken molar, no dentures, last saw dentist many years ago  Hematologic:  no easy bruising, no abnormal bleeding, no clotting disorder, no frequent epistaxis  Endocrine:  no diabetes, does not check CBG's at home     Physical Exam:   BP 104/66 (BP Location: Right Arm)   Pulse 72   Temp 98.4 F (36.9 C) (Oral)   Resp 18   Ht '5\' 11"'$  (1.803 m)   Wt 222 lb 4.8 oz (100.8 kg) Comment: scale c  SpO2 99%   BMI 31.00 kg/m   General:  Mildly obese,  well-appearing  HEENT:  Unremarkable   Neck:   no JVD, no bruits, no adenopathy   Chest:   clear to auscultation, symmetrical breath sounds, no wheezes, no rhonchi   CV:   RRR, soft grade III/VI systolic murmur   Abdomen:  soft, non-tender, no masses   Extremities:  warm, well-perfused, pulses palpable, no lower extremity edema  Rectal/GU  Deferred  Neuro:   Grossly non-focal and symmetrical throughout  Skin:   Clean and dry, no rashes, no breakdown  Diagnostic Tests:  CT ANGIOGRAPHY CHEST WITH CONTRAST  TECHNIQUE: Multidetector CT imaging of the chest was performed using the standard protocol during bolus administration of intravenous contrast. Multiplanar CT image reconstructions and MIPs were obtained to evaluate the vascular anatomy.  CONTRAST:  177m ISOVUE-370 IOPAMIDOL (ISOVUE-370) INJECTION 76%  COMPARISON:  Chest radiograph performed 02/27/2017  FINDINGS: Cardiovascular: A small pulmonary infarct is suspected at the right lung base, raising concern for mild mostly resolved pulmonary embolus. No central pulmonary embolus is identified.  The heart is borderline enlarged. The thoracic aorta is not well assessed but appears grossly unremarkable. The great vessels are grossly  unremarkable in appearance.  Mediastinum/Nodes: No mediastinal lymphadenopathy is seen. No pericardial effusion is identified. The visualized portions of the thyroid gland are unremarkable. No axillary lymphadenopathy is appreciated.  Lungs/Pleura: A small right pleural effusion is noted. The hazy wedge-shaped opacity at the right lung base is suspicious for pulmonary infarct. No pneumothorax is seen. The left lung appears clear. No masses are identified.  Upper Abdomen: The visualized portions of the liver and spleen are unremarkable. There is reflux of contrast into the hepatic veins and IVC.  Musculoskeletal: No acute osseous abnormalities are identified. The visualized musculature is unremarkable in appearance.  Review of the MIP images confirms the above findings.  IMPRESSION: 1. Small pulmonary infarct suspected at the right lung base, raising concern for mild mostly resolved pulmonary embolus. 2. Associated small right pleural effusion noted. 3. Borderline cardiomegaly.  Critical Value/emergent results were called by telephone at the time of interpretation on 02/28/2017 at 12:44 am to Dr. Dina Rich, who verbally acknowledged these results.   Electronically Signed   By: Garald Balding M.D.   On: 02/28/2017 00:47   Transthoracic Echocardiography  Joshua May:    Kwamaine, Cuppett MR #:       [id number] Study Date: 03/01/2017 Gender:     M Age:        41 Height:     180.3 cm Weight:     107.5 kg BSA:        2.35 m^2 Pt. Status: Room:       3E18C   PERFORMING   [facility], Inpatient  SONOGRAPHER  Darlina Sicilian, RDCS  ADMITTING    Opyd, [place]    Dessa Phi Chahn-Yang  Chase Caller, Jennifer Chahn-Yang  REFERRING    Dessa Phi Chahn-Yang  cc:  ------------------------------------------------------------------- LV EF: 25% -   30%  ------------------------------------------------------------------- Indications:      Dyspnea  786.09.  ------------------------------------------------------------------- History:   PMH:  Pulmonary Embolism / DVT.  Coronary artery disease.  PMH:   Myocardial infarction.  Risk factors: Dyslipidemia.  ------------------------------------------------------------------- Study Conclusions  - Left ventricle: The cavity size was mildly dilated. Wall   thickness was normal. Diffuse hypokinesis with inferolateral   akinesis. Systolic function was severely reduced. The estimated   ejection fraction was in the range of 25% to 30%. Doppler   parameters are consistent with restrictive physiology, indicative   of decreased left ventricular diastolic compliance and/or   increased left atrial pressure. - Ventricular septum: D-shaped interventricular septum suggestive   of RV pressure/volume overload. - Aortic valve: There was no stenosis. - Mitral valve: There was moderate to severe regurgitation, suspect   infarct-related MR given inferolateral akinesis and restriction   of posterior leaflet. - Left atrium: The atrium was mildly dilated. - Right ventricle: Poorly visualized. The cavity size was normal.   Systolic function was mildly reduced. - Right atrium: The atrium was mildly dilated. - Tricuspid valve: Peak RV-RA gradient (S): 50 mm Hg. - Pulmonary arteries: PA peak pressure: 58 mm Hg (S). - Systemic veins: IVC measured 2.0 cm with < 50% respirophasic   variation, suggesting RA pressure 8 mmHg.  Impressions:  - Mildly dilated LV with EF 25-30%, diffuse  hypokinesis with   inferolateral akinesis. Restrictive diastolic function. RV poorly   visualized but appears normal in size with mildly decreased   systolic function. D-shaped interventricular septum is suggestive   of RV pressure/volume overload. Moderate pulmonary hypertension.   Moderate to severe mitral regurgitation, suspect infarct-related   MR with restricted posterior leaflet and akinetic inferolateral    wall.  ------------------------------------------------------------------- Study data:  Comparison was made to the study of 10/21/2015.  Study status:  Routine.  Procedure:  The Joshua May reported no pain pre or post test. Transthoracic echocardiography. Image quality was poor. The study was technically difficult, as a result of poor sound wave transmission. Intravenous contrast (Definity) was administered. Study completion:  There were no complications. Transthoracic echocardiography.  M-mode, complete 2D, spectral Doppler, and color Doppler.  Birthdate:  Joshua May birthdate: 12/06/1975.  Age:  Joshua May is 42 yr old.  Sex:  Gender: male. BMI: 33.1 kg/m^2.  Blood pressure:     120/80  Joshua May status: Inpatient.  Study date:  Study date: 03/01/2017. Study time: 10:32 AM.  Location:  Bedside.  -------------------------------------------------------------------  ------------------------------------------------------------------- Left ventricle:  The cavity size was mildly dilated. Wall thickness was normal. Diffuse hypokinesis with inferolateral akinesis. Systolic function was severely reduced. The estimated ejection fraction was in the range of 25% to 30%. Doppler parameters are consistent with restrictive physiology, indicative of decreased left ventricular diastolic compliance and/or increased left atrial pressure.  ------------------------------------------------------------------- Aortic valve:   Trileaflet.  Doppler:   There was no stenosis. There was no regurgitation.  ------------------------------------------------------------------- Aorta:  Aortic root: The aortic root was normal in size. Ascending aorta: The ascending aorta was normal in size.  ------------------------------------------------------------------- Mitral valve:   Mildly calcified leaflets .  Doppler:   There was no evidence for stenosis.   There was moderate to severe regurgitation, suspect infarct-related  MR given inferolateral akinesis and restriction of posterior leaflet.    Peak gradient (D): 11 mm Hg.  ------------------------------------------------------------------- Left atrium:  The atrium was mildly dilated.  ------------------------------------------------------------------- Right ventricle:  Poorly visualized. The cavity size was normal. Systolic function was mildly reduced.  ------------------------------------------------------------------- Ventricular septum:   D-shaped interventricular septum suggestive of RV pressure/volume overload.  ------------------------------------------------------------------- Pulmonic valve:    Structurally normal valve.   Cusp separation was normal.  Doppler:  Transvalvular velocity was within the normal range. There was trivial regurgitation.  ------------------------------------------------------------------- Tricuspid valve:   Doppler:  There was mild regurgitation.  ------------------------------------------------------------------- Right atrium:  The atrium was mildly dilated.  ------------------------------------------------------------------- Pericardium:  There was no pericardial effusion.  ------------------------------------------------------------------- Systemic veins:  IVC measured 2.0 cm with < 50% respirophasic variation, suggesting RA pressure 8 mmHg.  ------------------------------------------------------------------- Measurements   Left ventricle                             Value        Reference  LV ID, ED, PLAX chordal            (H)     56.1  mm     43 - 52  LV ID, ES, PLAX chordal            (H)     49.7  mm     23 - 38  LV fx shortening, PLAX chordal     (L)     11    %      >=29  LV PW thickness, ED  10.6  mm     ----------  IVS/LV PW ratio, ED                        1.08         <=1.3  Stroke volume, 2D                          30    ml     ----------  Stroke volume/bsa, 2D                       13    ml/m^2 ----------  LV end-diastolic volume, 1-p F6C           197   ml     ----------  LV ejection fraction, 1-p A4C              34    %      ----------  LV end-diastolic volume/bsa, 1-p           84    ml/m^2 ----------  L2X  LV end-diastolic volume, 2-p               187   ml     ----------  LV end-systolic volume, 2-p                140   ml     ----------  LV ejection fraction, 2-p                  25    %      ----------  Stroke volume, 2-p                         47    ml     ----------  LV end-diastolic volume/bsa, 2-p           79    ml/m^2 ----------  LV end-systolic volume/bsa, 2-p            59    ml/m^2 ----------  Stroke volume/bsa, 2-p                     20    ml/m^2 ----------  LV e&', lateral                             8.49  cm/s   ----------  LV E/e&', lateral                           19.79        ----------  LV e&', medial                              7.29  cm/s   ----------  LV E/e&', medial                            23.05        ----------  LV e&', average                             7.89  cm/s   ----------  LV E/e&', average  21.29        ----------    Ventricular septum                         Value        Reference  IVS thickness, ED                          11.5  mm     ----------    LVOT                                       Value        Reference  LVOT ID, S                                 17    mm     ----------  LVOT area                                  2.27  cm^2   ----------  LVOT peak velocity, S                      84.2  cm/s   ----------  LVOT mean velocity, S                      54.9  cm/s   ----------  LVOT VTI, S                                13    cm     ----------    Aorta                                      Value        Reference  Aortic root ID, ED                         24    mm     ----------    Left atrium                                Value        Reference  LA ID, A-P, ES                              48    mm     ----------  LA ID/bsa, A-P                             2.04  cm/m^2 <=2.2  LA volume, S                               55.6  ml     ----------  LA volume/bsa, S  23.6  ml/m^2 ----------  LA volume, ES, 1-p A4C                     54.1  ml     ----------  LA volume/bsa, ES, 1-p A4C                 23    ml/m^2 ----------  LA volume, ES, 1-p A2C                     57.3  ml     ----------  LA volume/bsa, ES, 1-p A2C                 24.4  ml/m^2 ----------    Mitral valve                               Value        Reference  Mitral E-wave peak velocity                168   cm/s   ----------  Mitral A-wave peak velocity                49.4  cm/s   ----------  Mitral deceleration time           (L)     127   ms     150 - 230  Mitral peak gradient, D                    11    mm Hg  ----------  Mitral E/A ratio, peak                     3.4          ----------  Mitral regurg VTI, PISA                    120   cm     ----------  Mitral ERO, PISA                           0.09  cm^2   ----------  Mitral regurg volume, PISA                 11    ml     ----------    Pulmonary arteries                         Value        Reference  PA pressure, S, DP                 (H)     58    mm Hg  <=30    Tricuspid valve                            Value        Reference  Tricuspid regurg peak velocity             353   cm/s   ----------  Tricuspid peak RV-RA gradient              50    mm Hg  ----------    Right atrium  Value        Reference  RA ID, S-I, ES, A4C                (H)     63.4  mm     34 - 49  RA area, ES, A4C                   (H)     22.2  cm^2   8.3 - 19.5  RA volume, ES, A/L                         62.7  ml     ----------  RA volume/bsa, ES, A/L                     26.6  ml/m^2 ----------    Systemic veins                             Value        Reference  Estimated CVP                              8      mm Hg  ----------    Right ventricle                            Value        Reference  TAPSE                                      19.4  mm     ----------  RV s&', lateral, S                          14    cm/s   ----------  Legend: (L)  and  (H)  mark values outside specified reference range.  ------------------------------------------------------------------- Prepared and Electronically Authenticated by  Loralie Champagne, M.D. 2019-02-11T14:50:06    INTRAVASCULAR PRESSURE WIRE/FFR STUDY  LEFT HEART CATH AND CORONARY ANGIOGRAPHY  Conclusion     Previously placed Prox Cx to Mid Cx stent (unknown type) is widely patent.  Prox Cx lesion is 45% stenosed. - proximal to the Stent -- FFR 0.88.  Prox RCA to Mid RCA stent is 55% stenosed.  Dist LAD lesion is 20% stenosed.  LV end diastolic pressure is severely elevated. 30-31 mmHg   Angiographically moderate lesion prior to the circumflex stent which is likely just a step up.  FFR 0.88.  Otherwise minimal CAD.  No change to the in-stent restenosis of the RCA.  Severely elevated EDP, likely related to cardiomyopathy and mitral regurgitation.  Plan: Medical management and consider possibility of mitral valve repair.   Discussed with Dr. Sallyanne Kuster.    Glenetta Hew, M.D., M.S. Interventional Cardiologist   Pager # [phone] Phone # [phone] [address] [place], [place] [address]    Indications   Ischemic cardiomyopathy [I25.5 (ICD-10-CM)]  Non-rheumatic mitral regurgitation [I34.0 (ICD-10-CM)]  Coronary artery disease involving native coronary artery of native heart with angina pectoris ([place]) [I25.119 (ICD-10-CM)]  Procedural Details/Technique   Technical Details PCP: Joshua May, No Pcp Per Primary Cardiologist: Dr Percival Spanish  Mr. Meader is an unemployed  42 y/o AA male with a history of CAD dating back to 2007 when he had a PCI in [place]. In 2010 he presented with a STEMI and had an RCA DES  placed. He followed up for a year but then didn't present again till Oct 2017 when he presented with a CFX infarct. Cath revealed 50% ISR of the RCA, total mCFX and 85% OM3. He had CFX PCI with DES and OM3 POBA. His EF then was 50-55%.   He presented to [facility] emergency room pain followed by chest pain. As part of his evaluation he has been found to have a PE but also significantly reduced ejection fraction with inferior inferolateral hypokinesis to akinesis. He is now referred for ischemic evaluation for ongoing intermittent chest discomfort and newly reduced EF which would suggest possible occlusion of his circumflex stent.  Time Out: Verified Joshua May identification, verified procedure, site/side was marked, verified correct Joshua May position, special equipment/implants available, medications/allergies/relevent history reviewed, required imaging and test results available. Performed. Consent Signed.   Access:  * RIGHT Radial Artery: 6 Fr sheath -- Seldinger technique using Angiocath Micropuncture Kit * 10 mL radial cocktail IA; 5500 Units IV Heparin   Left Heart Catheterization: 5 & 6Fr Catheters advanced or exchanged over a J-wire under direct fluoroscopic guidance into the ascending aorta; TIG 4.0 catheter advanced first.  * Left Coronary Artery Cineangiography: After several catheters including TIG 4.0, JL 3.5 and EBU 3.5 catheters, I finally ended using a 6 [place] XB LAD 3.5 guide catheter catheter  * Right Coronary Artery Cineangiography: TIG 4.0 catheter  * LV Hemodynamics (no LV Gram): Angled pigtail catheter  After initial review of angiography, there did appear to be a slight step up in the circumflex just prior to the stent. On several images there was an appearance of a hazy possible significant lesion upstream and we therefore decided to evaluate with FFR.  Additional bolus of IV heparin was administered (5500 units). The 6 [place] XB LAD 3.5 guide catheter was reintroduced. A  pro-water wire was used and the Microcatheter Navvus was used to measure FFR. Baseline FFR was 0.98. Final FFR 0.88. -IV adenosine was perfused at the standard rate of 140 mg/kilogram/min for a total of 2:15 min. The FFR catheter and wire were then removed completely out of the body.   - After completion of angiography, the catheter was removed completely out of the body over wire without complication.  Radial sheath(s) removed in the Cath Lab with TR band placed for hemostasis.   TR Band: 1050 Hours; 14 mL air  MEDICATIONS * SQ Lidocaine 59m * Radial Cocktail: 3 mg Verapamil in 10 mL NS * Isovue Contrast: 108 mL * Heparin: Total 11,000 units *IV adenosine infused at 140 mg/kg/min for total of 2: 15 min * IC NTG - n/a  Fluoro time: 16 minutes. Dose Area Product: [id number]. Cumulative Air Kerma: 596 mGy.    Estimated blood loss <50 mL.  During this procedure the Joshua May was administered the following to achieve and maintain moderate conscious sedation: Versed 2 mg, Fentanyl 50 mcg, while the Joshua May's heart rate, blood pressure, and oxygen saturation were continuously monitored. The period of conscious sedation was 67 minutes, of which I was present face-to-face 100% of this time.  Complications   Complications documented before study signed (03/03/2017 11:22 AM EST)    No complications were associated with this study.  Documented by HLeonie Man MD - 03/03/2017 11:21 AM EST    Coronary  Findings   Diagnostic  Dominance: Right  Left Anterior Descending  Vessel is large. Vessel is angiographically normal.  Dist LAD lesion 20% stenosed  Dist LAD lesion is 20% stenosed.  First Diagonal Branch  Vessel is large in size. Vessel is angiographically normal.  Second Diagonal Branch  Vessel is moderate in size. Vessel is angiographically normal.  Second Septal Branch  The vessel exhibits minimal luminal irregularities.  Third Diagonal Branch  Vessel is small in size.  Left  Circumflex  Vessel is large.  Prox Cx lesion 45% stenosed  Prox Cx lesion is 45% stenosed. The lesion is discrete. Pre-stent Pressure wire/FFR was performed on the lesion. FFR: 0.88. Not physiologically significant  Prox Cx to Mid Cx lesion 0% stenosed  Previously placed Prox Cx to Mid Cx stent (unknown type) is widely patent.  First Obtuse Marginal Branch  Vessel is moderate in size.  Left Atrioventricular Groove Continuation  Vessel is small in size.  Right Coronary Artery  Prox RCA to Mid RCA lesion 55% stenosed  Prox RCA to Mid RCA lesion is 55% stenosed. diffuse The lesion was previously treated using a stent (unknown type) over 2 years ago. Previously placed stent displays restenosis.  Acute Marginal Branch  Vessel is small in size.  Right Posterior Descending Artery  Vessel is small in size.  Intervention   No interventions have been documented.  Wall Motion   No LV Gram        Left Heart   Left Ventricle LV end diastolic pressure is severely elevated.  Coronary Diagrams   Diagnostic Diagram       Implants     No implant documentation for this case.  MERGE Images   Show images for CARDIAC CATHETERIZATION   Link to Procedure Log   Procedure Log    Hemo Data    Most Recent Value  AO Systolic Pressure 161 mmHg  AO Diastolic Pressure 78 mmHg  AO Mean 89 mmHg  LV Systolic Pressure 096 mmHg  LV Diastolic Pressure 12 mmHg  LV EDP 32 mmHg  Arterial Occlusion Pressure Extended Systolic Pressure 045 mmHg  Arterial Occlusion Pressure Extended Diastolic Pressure 95 mmHg  Arterial Occlusion Pressure Extended Mean Pressure 109 mmHg  Left Ventricular Apex Extended Systolic Pressure 409 mmHg  Left Ventricular Apex Extended Diastolic Pressure 19 mmHg  Left Ventricular Apex Extended EDP Pressure 35 mmHg      Impression:  Joshua May has recent pulmonary embolus involving the right lower lobe and acute on chronic systolic congestive heart failure due to underlying  ischemic cardiomyopathy with secondary mitral regurgitation.  I have personally reviewed the Joshua May's recent CT angiogram, transthoracic echocardiogram, and diagnostic cardiac catheterization.  CT angiogram reveals likely recent pulmonary embolism involving a portion of the right lower lobe.  This may or may not have been the source of the Joshua May's original epigastric pain which prompted hospitalization.  The Joshua May has clear history of progressive symptoms of exertional shortness of breath, orthopnea, and occasional PND consistent with acute on chronic systolic congestive heart failure.  Symptoms have improved somewhat over the last few days with diuretic therapy.  Transthoracic echocardiogram demonstrates severe left ventricular systolic dysfunction with akinesis of the posterior lateral wall and hypokinesis of the inferior wall.  There was at least moderate mitral regurgitation with functional anatomy consistent with type IIIB mitral valve dysfunction consistent with ischemic mitral regurgitation.  Diagnostic cardiac catheterization reveals continued patency of all stents in the left circumflex and right coronary artery territories with otherwise  moderate nonobstructive disease including a 45% stenosis in the proximal left circumflex coronary artery and 55% in-stent restenosis of the right coronary artery.  There is nonobstructive disease in the left anterior descending coronary artery distribution.  At this time there are no absolute indications for surgical intervention.  The Joshua May does not need surgical revascularization, and there remains a possibility that his mitral regurgitation will improve with aggressive medical therapy for management of congestive heart failure.  However, I agree that he will need to be followed closely and there remains a significant possibility that his mitral regurgitation might ultimately need to be addressed surgically.   Plan:  I have discussed matters at length with  the Joshua May and his wife at the bedside this afternoon.  We reviewed the underlying cause of his ischemic heart disease and the importance of risk factor modification and close long-term medical follow-up.  We discussed the potential indications for surgical treatment of ischemic mitral regurgitation and all of his questions have been addressed.  I recommend continued plans for systemic anticoagulation to treat his pulmonary embolus and aggressive medical therapy for management of his congestive heart failure.  He will need follow-up TEE and possible right heart catheterization at some point in the future to see whether or not he might benefit from surgical treatment of his ischemic mitral regurgitation.  I would be happy to see him back in follow-up at that time if necessary.  All of his questions have been addressed.   I spent in excess of 120 minutes during the conduct of this hospital consultation and >50% of this time involved direct face-to-face encounter for counseling and/or coordination of the Joshua May's care.   Valentina Gu. Roxy Manns, MD 03/04/2017 1:39 PM

## 2017-03-05 ENCOUNTER — Telehealth (HOSPITAL_COMMUNITY): Payer: Self-pay | Admitting: *Deleted

## 2017-03-05 DIAGNOSIS — I5043 Acute on chronic combined systolic (congestive) and diastolic (congestive) heart failure: Secondary | ICD-10-CM

## 2017-03-05 LAB — BASIC METABOLIC PANEL
Anion gap: 12 (ref 5–15)
BUN: 20 mg/dL (ref 6–20)
CHLORIDE: 105 mmol/L (ref 101–111)
CO2: 23 mmol/L (ref 22–32)
Calcium: 9.3 mg/dL (ref 8.9–10.3)
Creatinine, Ser: 1.16 mg/dL (ref 0.61–1.24)
GFR calc Af Amer: >60 mL/min (ref >60)
GFR calc non Af Amer: >60 mL/min (ref >60)
GLUCOSE: 91 mg/dL (ref 65–99)
POTASSIUM: 4.3 mmol/L (ref 3.5–5.1)
SODIUM: 140 mmol/L (ref 135–145)

## 2017-03-05 LAB — CBC
HEMATOCRIT: 42.7 % (ref 39.0–52.0)
HEMOGLOBIN: 13.4 g/dL (ref 13.0–17.0)
MCH: 28 pg (ref 26.0–34.0)
MCHC: 31.4 g/dL (ref 30.0–36.0)
MCV: 89.3 fL (ref 78.0–100.0)
Platelets: 314 10*3/uL (ref 150–400)
RBC: 4.78 MIL/uL (ref 4.22–5.81)
RDW: 13.9 % (ref 11.5–15.5)
WBC: 10 10*3/uL (ref 4.0–10.5)

## 2017-03-05 MED ORDER — FUROSEMIDE 40 MG PO TABS: 40.0000 mg | ORAL_TABLET | Freq: Every day | ORAL | 0 refills | Status: DC

## 2017-03-05 MED ORDER — ATORVASTATIN CALCIUM 80 MG PO TABS: 80.0000 mg | ORAL_TABLET | Freq: Every day | ORAL | 0 refills | Status: DC

## 2017-03-05 MED ORDER — SPIRONOLACTONE 25 MG PO TABS: 25.0000 mg | ORAL_TABLET | Freq: Every day | ORAL | 0 refills | Status: DC

## 2017-03-05 MED ORDER — LOSARTAN POTASSIUM 25 MG PO TABS: 25.0000 mg | ORAL_TABLET | Freq: Every day | ORAL | 0 refills | Status: DC

## 2017-03-05 MED ORDER — CARVEDILOL 3.125 MG PO TABS: 3.1250 mg | ORAL_TABLET | Freq: Two times a day (BID) | ORAL | 0 refills | Status: DC

## 2017-03-05 MED ORDER — RIVAROXABAN (XARELTO) VTE STARTER PACK (15 & 20 MG): ORAL_TABLET | ORAL | 0 refills | Status: DC

## 2017-03-05 MED ORDER — SPIRONOLACTONE 25 MG PO TABS
25.0000 mg | ORAL_TABLET | Freq: Every day | ORAL | Status: DC
  Administered 2017-03-05: 25 mg via ORAL
  Filled 2017-03-05: qty 1

## 2017-03-05 MED FILL — XARELTO STARTER PACK: 15 & 20 | 30 days supply | Qty: 51 | Fill #0

## 2017-03-05 MED FILL — CARVEDILOL 3.125 MG TABLET: 3.125 | 34 days supply | Qty: 68 | Fill #0

## 2017-03-05 MED FILL — FUROSEMIDE 40 MG TAB: 40 | 30 days supply | Qty: 30 | Fill #0

## 2017-03-05 MED FILL — ATORVASTATIN 80 MG TABLET: 80 | 34 days supply | Qty: 34 | Fill #0

## 2017-03-05 MED FILL — SPIRONOLACTONE 25 MG TABLET: 25 | 34 days supply | Qty: 34 | Fill #0

## 2017-03-05 MED FILL — LOSARTAN POTASSIUM 25 MG TA: 25 | 34 days supply | Qty: 34 | Fill #0

## 2017-03-05 NOTE — Telephone Encounter (Signed)
Pt being D/C from hospital today, the following meds were faxed to [facility] under HF FUND:  Losartan 25 mg daily Lasix 40 mg daily Spironolactone 25 mg daily Carvedilol 3.125 mg Twice daily  Atorvastatin 80 mg daily  Pt is also on Xarelto and was given 30 day free card by Otilio SaberAndy Tillery, PA.  Pt is sch for f/u appt 03/16/17 at 9AM, will make Cecil CobbsErika pharm D and Annice PihJackie CSW aware to f/u with pt at that time for further assistance.

## 2017-03-05 NOTE — Progress Notes (Signed)
Pt has orders to be discharged. Discharge instructions given and pt has no additional questions at this time. Medication regimen reviewed and pt educated. Pt verbalized understanding and has no additional questions. Telemetry box removed. IV removed and site in good condition. Pt stable and waiting for transportation. 

## 2017-03-05 NOTE — Progress Notes (Signed)
Advanced Heart Failure Rounding Note  PCP-Cardiologist: Rollene Rotunda, MD  HF Cardiology: Dr. Shirlee Latch  Subjective:    - 600 mls with 40 mg IV lasix x1 yesterday. Weight unchanged. Started spiro yesterday. Creatinine stable at 1.16. K 4.3.  No CP, SOB, or dizziness. Has been moving around in the room.    Objective:   Weight Range: 222 lb (100.7 kg) Body mass index is 30.96 kg/m.   Vital Signs:   Temp:  [98.1 F (36.7 C)-98.6 F (37 C)] 98.4 F (36.9 C) (02/15 0757) Pulse Rate:  [72-79] 79 (02/15 0757) Resp:  [17-18] 17 (02/15 0757) BP: (98-111)/(54-66) 98/63 (02/15 0757) SpO2:  [96 %-100 %] 100 % (02/15 0757) Weight:  [222 lb (100.7 kg)] 222 lb (100.7 kg) (02/15 0307) Last BM Date: 03/04/17  Weight change: Filed Weights   03/03/17 0611 03/04/17 0455 03/05/17 0307  Weight: 230 lb 6.4 oz (104.5 kg) 222 lb 4.8 oz (100.8 kg) 222 lb (100.7 kg)    Intake/Output:   Intake/Output Summary (Last 24 hours) at 03/05/2017 0804 Last data filed at 03/04/2017 1345 Gross per 24 hour  Intake 716 ml  Output 1325 ml  Net -609 ml      Physical Exam    General:  Well appearing. No resp difficulty HEENT: Normal Neck: Supple. JVP 5-6. Carotids 2+ bilat; no bruits. No lymphadenopathy or thyromegaly appreciated. Cor: PMI nondisplaced. Regular rate & rhythm. No rubs or gallops. 1/6 HSM apex Lungs: Diminished throughout Abdomen: Soft, nontender, nondistended. No hepatosplenomegaly. No bruits or masses. Good bowel sounds. Extremities: No cyanosis, clubbing, rash, edema Neuro: Alert & orientedx3, cranial nerves grossly intact. moves all 4 extremities w/o difficulty. Affect pleasant   Telemetry   NSR 70's. Personally reviewed.  EKG    No new tracings.  Labs    CBC Recent Labs    03/04/17 0643 03/05/17 0544  WBC 9.0 10.0  HGB 12.4* 13.4  HCT 39.0 42.7  MCV 88.2 89.3  PLT 293 314   Basic Metabolic Panel Recent Labs    29/56/21 0643 03/05/17 0544  NA 141 140  K  3.7 4.3  CL 102 105  CO2 26 23  GLUCOSE 93 91  BUN 20 20  CREATININE 1.35* 1.16  CALCIUM 9.3 9.3   Liver Function Tests No results for input(s): AST, ALT, ALKPHOS, BILITOT, PROT, ALBUMIN in the last 72 hours. No results for input(s): LIPASE, AMYLASE in the last 72 hours. Cardiac Enzymes No results for input(s): CKTOTAL, CKMB, CKMBINDEX, TROPONINI in the last 72 hours.  BNP: BNP (last 3 results) No results for input(s): BNP in the last 8760 hours.  ProBNP (last 3 results) No results for input(s): PROBNP in the last 8760 hours.   D-Dimer No results for input(s): DDIMER in the last 72 hours. Hemoglobin A1C No results for input(s): HGBA1C in the last 72 hours. Fasting Lipid Panel No results for input(s): CHOL, HDL, LDLCALC, TRIG, CHOLHDL, LDLDIRECT in the last 72 hours. Thyroid Function Tests No results for input(s): TSH, T4TOTAL, T3FREE, THYROIDAB in the last 72 hours.  Invalid input(s): FREET3  Other results:   Imaging     No results found.   Medications:     Scheduled Medications: . atorvastatin  80 mg Oral q1800  . carvedilol  3.125 mg Oral BID WC  . furosemide  40 mg Oral Daily  . losartan  25 mg Oral Daily  . rivaroxaban  15 mg Oral BID WC   Followed by  . [START ON  03/24/2017] rivaroxaban  20 mg Oral Daily  . spironolactone  12.5 mg Oral Daily     Infusions:   PRN Medications:  acetaminophen **OR** acetaminophen, bisacodyl, HYDROcodone-acetaminophen, morphine injection, ondansetron **OR** ondansetron (ZOFRAN) IV, senna-docusate    Patient Profile    Filmore Piccininni is a 42 y.o.maleAA male with a history of CAD dating back to 2007 when he had a PCI in [place]. In 2010 he presented with a STEMI and had an RCA DES placed. He followed up for a year but then didn't present again till Oct 2017 when he presented with a CFX infarct. Cath revealed 50% ISR of the RCA, total mCFX and 85% OM3. He had CFX PCI with DES and OM3 POBA. His EF then was 50-55%.     Assessment/Plan   1. Acute on chronic diastolic CHF: Ischemic cardiomyopathy with EF 25-30% and inferolateral akinesis, also at least mild RV dysfunction with D-shaped septum (with recent PE).  Volume status stable on exam. No dyspnea walking around his room.  - Continue 40 mg PO lasix daily. - Continue Coreg 3.125 mg bid and losartan 25 mg daily.  Will try to transition to [facility] as outpatient, will not do yet given relatively soft BP.  - Increase spironolactone to 25 mg daily.  - Will need close outpatient followup for med titration.  We will see him in CHF clinic.  - I suspect he will need an ICD eventually, not CRT candidate.  2. CAD: Patient has extensive history of CAD with stents in LCx and RCA.  Cath this admission showed nonobstructive disease.  - Continue atorvastatin 80 mg daily.  - Given Xarelto use with stable CAD, have stopped ASA 81.  3. PE: Small RLL PE.  ?Etiology => no triggers such as recent surgery, sedentary lifestyle, or long car/plan trips.  Interestingly, his father had venous thromboembolism as well so likely a genetic basis.  - Have sent Factor V Leiden, prothrombin gene mutation, and Lupus anticoagulant (pending).  If the gene tests are abnormal, this could be helpful for his family.  I think that he will need long-term anticoagulation with untriggered VTE.  - Continue Xarelto 4. 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 Interestingly, his murmur is quite soft after extensive diuresis, and suspect MR is responsive to loading conditions.  Would plan medical management for 4-6 weeks and then TEE to assess MR.  If he has severe MR at that point and remains symptomatic, would consider surgical repair (benefit tends to be questionable for surgery with ischemic MR, generally reserve for failure of medical therapy). No change.  Medication concerns reviewed with patient and pharmacy team. Barriers identified: none  Will arrange hospital follow up in  HF clinic.   Length of Stay: 5  Alford Highland, NP  03/05/2017, 8:04 AM  Advanced Heart Failure Team Pager [phone] (M-F; 7a - 4p)  Please contact [facility] for night-coverage after hours (4p -7a ) and weekends on [email or web address]  Patient seen with NP, agree with the above note.  He is symptomatically stable today.  Labs stable.  SBP in 100s-110s generally.  He is not volume overloaded on exam.   I think that he can go home today.  Will arrange followup in CHF clinic.  He will likely need a TEE in 4-6 weeks to reassess the mitral valve on optimal medical therapy.  Meds for home: atorvastatin 80 mg daily, Xarelto, spironolactone 25 mg daily, Coreg 3.125 mg bid, losartan 25 mg daily, Lasix 40 mg daily.  Marca AnconaDalton Deepika [place] 03/05/2017 9:22 AM

## 2017-03-05 NOTE — Discharge Summary (Signed)
Physician Discharge Summary  Joshua May ZOX:[id number] DOB: 12-17-1975 DOA: 41/09/2017  PCP: Patient, No Pcp Per  Admit date: 02/27/2017 Discharge date: 03/05/2017  Admitted From: Home Disposition:  Home  Recommendations for Outpatient Follow-up:  1. Follow up with PCP. Establish with [facility] on 2/20 at 3pm 2. Follow up with CHF clinic in on 2/26 at 9am  3. Please obtain BMP in 1 week   Discharge Condition: Stable CODE STATUS: Full  Diet recommendation: Heart healthy   Brief/Interim Summary: Joshua May a 42 y.o.malewith medical history significant forhyperlipidemia, coronary artery disease with stents, and nonadherence with DAPT, now presenting to the emergency department for evaluation of epigastric and chest pain for several days with concomitant exertional dyspnea.Patient reports that the symptoms developed insidiously several days.Chest pain is right sided, nonradiating, intermittent, worse with certain movements and deep breaths, and different than the symptoms he experienced with his prior MIs. Denies any personal history of DVT or PE, but reports that his dad had a DVT. In the ED, CTA chest confirmed PE. He was started on lovenox and transitioned to Xarelto. Echocardiogram was completed which revealed worsening EF 25% with diffuse hypokinesis. Cardiology was consulted. He underwent heart cath on 2/13 which showed moderate lesion prior to circumflex stent, minimal CAD. No change to in-stent restenosis RCA. CHF team and cardiothoracic surgery were consulted for worsening CHF in setting of severe mitral regurgitation. He will be medically maximized and have close follow up with CHF team, undergo TEE in 4-6 weeks to reassess mitral valve for possible surgical consideration.   Discharge Diagnoses:  Principal Problem:   Pulmonary embolism (HCC) Active Problems:   HLD (hyperlipidemia)   CAD (coronary artery disease)   Ischemic  cardiomyopathy   Chronic systolic CHF (congestive heart failure) (HCC)   Mitral regurgitation  Pulmonary embolism -LE doppler negative for DVT -Has family hx of VTE (father). Factor 5 leiden, lupus anticoagulant, prothrombin gene pending. Due to unprovoked PE, patient will need long-term, if not life long, anticoagulation regardless of genetic testing  -Xarelto prescribed at discharge   CAD -He had previously undergone BMS in 2010 and DES in October 2017. He was lost to follow up due to insurance issues and stopped Brilinta about 7 months ago along with statin, beta blocker, ACE inhibitor. He is currently on baby aspirin only at home  -Cardiology following  -Heart cath 2/13 showed moderate lesion prior to circumflex stent, minimal CAD. No change to in-stent restenosis RCA    Acute systolic HF, ICM -Echocardiogram with new EF 25-30% with diffuse hypokinesis and inferolateral akinesis -Coreg, ARB, Lasix, aldactone  -Follow up with CHF team as outpatient for TEE in 4-6 weeks   Severe MR -Evaluated by Dr. Barry Dienes in the hospital. Patient to be medically maximized, then follow up with CHF team.   HLD -Lipitor   Discharge Instructions  Discharge Instructions    (HEART FAILURE PATIENTS) Call MD:  Anytime you have any of the following symptoms: 1) 3 pound weight gain in 24 hours or 5 pounds in 1 week 2) shortness of breath, with or without a dry hacking cough 3) swelling in the hands, feet or stomach 4) if you have to sleep on extra pillows at night in order to breathe.   Complete by:  As directed    Call MD for:   Complete by:  As directed    Chest pain or shortness of breath   Call MD for:  difficulty breathing, headache  or visual disturbances   Complete by:  As directed    Call MD for:  extreme fatigue   Complete by:  As directed    Call MD for:  persistant dizziness or light-headedness   Complete by:  As directed    Call MD for:  persistant nausea and vomiting   Complete by:   As directed    Call MD for:  severe uncontrolled pain   Complete by:  As directed    Call MD for:  temperature >100.4   Complete by:  As directed    Diet - low sodium heart healthy   Complete by:  As directed    Discharge instructions   Complete by:  As directed    You were cared for by a hospitalist during your hospital stay. If you have any questions about your discharge medications or the care you received while you were in the hospital after you are discharged, you can call the unit and asked to speak with the hospitalist on call if the hospitalist that took care of you is not available. Once you are discharged, your primary care physician will handle any further medical issues. Please note that NO REFILLS for any discharge medications will be authorized once you are discharged, as it is imperative that you return to your primary care physician (or establish a relationship with a primary care physician if you do not have one) for your aftercare needs so that they can reassess your need for medications and monitor your lab values.   Increase activity slowly   Complete by:  As directed      Allergies as of 03/05/2017      Reactions   Shellfish Allergy Anaphylaxis      Medication List    STOP taking these medications   aspirin EC 81 MG tablet   lisinopril 10 MG tablet Commonly known as:  PRINIVIL,ZESTRIL   metoprolol tartrate 25 MG tablet Commonly known as:  LOPRESSOR   ticagrelor 90 MG Tabs tablet Commonly known as:  BRILINTA     TAKE these medications   atorvastatin 80 MG tablet Commonly known as:  LIPITOR Take 1 tablet (80 mg total) by mouth daily.   carvedilol 3.125 MG tablet Commonly known as:  COREG Take 1 tablet (3.125 mg total) by mouth 2 (two) times daily with a meal.   furosemide 40 MG tablet Commonly known as:  LASIX Take 1 tablet (40 mg total) by mouth daily. Start taking on:  03/06/2017   losartan 25 MG tablet Commonly known as:  COZAAR Take 1 tablet (25  mg total) by mouth daily. Start taking on:  03/06/2017   nitroGLYCERIN 0.4 MG SL tablet Commonly known as:  NITROSTAT Place 1 tablet (0.4 mg total) under the tongue every 5 (five) minutes as needed for chest pain.   Rivaroxaban 15 & 20 MG Tbpk Take as directed on package: Start with one 15mg  tablet by mouth twice a day with food. On Day 22, switch to one 20mg  tablet once a day with food.   spironolactone 25 MG tablet Commonly known as:  ALDACTONE Take 1 tablet (25 mg total) by mouth daily.      Follow-up Information    [facility]. Go on 03/10/2017.   Why:  @3pm  Contact information: [address] [place] [address] [phone]       [facility]. Go on 03/16/2017.   Specialty:  Cardiology Why:  9:00 AM, [facility], parking code [id number] Contact information: [address] [id number] [address]c [place] [address] [phone]         Allergies  Allergen Reactions  . Shellfish Allergy Anaphylaxis    Consultations:  Cardiology  CHF team  Cardiothoracic surgery    Procedures/Studies: Dg Chest 2 View  Result Date: 02/27/2017 CLINICAL DATA:  Epigastric pain for 3 days, shortness of breath when lying down and with exertion for 2 weeks, nausea, vomiting, diarrhea abdominal swelling, history cardiac disease (coronary artery disease post MI and coronary stenting), asthma, former smoker EXAM: CHEST  2 VIEW COMPARISON:  03/31/2008 FINDINGS: Enlargement of cardiac silhouette. Mediastinal contours and pulmonary vascularity normal. Minimal chronic peribronchial thickening. Tiny RIGHT pleural effusion. No acute infiltrate, LEFT pleural effusion or pneumothorax. Bones unremarkable. IMPRESSION: Enlargement of cardiac silhouette. Minimal chronic bronchitic changes and tiny RIGHT pleural effusion without acute infiltrate. Electronically Signed   By: Ulyses Southward  M.D.   On: 02/27/2017 20:17   Ct Angio Chest Pe W And/or Wo Contrast  Result Date: 02/28/2017 CLINICAL DATA:  Acute onset of epigastric abdominal pain. Shortness of breath with exertion and when lying down. Elevated D-dimer. EXAM: CT ANGIOGRAPHY CHEST WITH CONTRAST TECHNIQUE: Multidetector CT imaging of the chest was performed using the standard protocol during bolus administration of intravenous contrast. Multiplanar CT image reconstructions and MIPs were obtained to evaluate the vascular anatomy. CONTRAST:  ISOVUE-370 IOPAMIDOL (ISOVUE-370) INJECTION 76% COMPARISON:  Chest radiograph performed 02/27/2017 FINDINGS: Cardiovascular: A small pulmonary infarct is suspected at the right lung base, raising concern for mild mostly resolved pulmonary embolus. No central pulmonary embolus is identified. The heart is borderline enlarged. The thoracic aorta is not well assessed but appears grossly unremarkable. The great vessels are grossly unremarkable in appearance. Mediastinum/Nodes: No mediastinal lymphadenopathy is seen. No pericardial effusion is identified. The visualized portions of the thyroid gland are unremarkable. No axillary lymphadenopathy is appreciated. Lungs/Pleura: A small right pleural effusion is noted. The hazy wedge-shaped opacity at the right lung base is suspicious for pulmonary infarct. No pneumothorax is seen. The left lung appears clear. No masses are identified. Upper Abdomen: The visualized portions of the liver and spleen are unremarkable. There is reflux of contrast into the hepatic veins and IVC. Musculoskeletal: No acute osseous abnormalities are identified. The visualized musculature is unremarkable in appearance. Review of the MIP images confirms the above findings. IMPRESSION: 1. Small pulmonary infarct suspected at the right lung base, raising concern for mild mostly resolved pulmonary embolus. 2. Associated small right pleural effusion noted. 3. Borderline cardiomegaly. Critical  Value/emergent results were called by telephone at the time of interpretation on 02/28/2017 at 12:44 am to Dr. Wilkie Aye, who verbally acknowledged these results. Electronically Signed   By: Roanna Raider M.D.   On: 02/28/2017 00:47   Vas [place] Lower Extremity Venous (dvt)  Result Date: 02/28/2017  Lower Venous Study Risk Factors: Confirmed PE. Examination Guidelines: A complete evaluation includes B-mode imaging, spectral doppler, color doppler, and power doppler as needed of all accessible portions of each vessel. Bilateral testing is considered an integral part of a complete examination. Limited examinations for reoccurring indications may be performed as noted. The reflux portion of the exam is performed with the patient in reverse Trendelenburg.  Right Venous Findings: +---------+---------------+---------+-----------+----------+-------+          CompressibilityPhasicitySpontaneityPropertiesSummary +---------+---------------+---------+-----------+----------+-------+ CFV      Full           Yes  Yes                          +---------+---------------+---------+-----------+----------+-------+ FV Prox  Full                                                 +---------+---------------+---------+-----------+----------+-------+ FV Mid   Full                                                 +---------+---------------+---------+-----------+----------+-------+ FV DistalFull                                                 +---------+---------------+---------+-----------+----------+-------+ PFV      Full                                                 +---------+---------------+---------+-----------+----------+-------+ POP      Full           Yes      Yes                          +---------+---------------+---------+-----------+----------+-------+ PTV      Full                                                  +---------+---------------+---------+-----------+----------+-------+ PERO     Full                                                 +---------+---------------+---------+-----------+----------+-------+  Left Venous Findings: +---------+---------------+---------+-----------+----------+-------+          CompressibilityPhasicitySpontaneityPropertiesSummary +---------+---------------+---------+-----------+----------+-------+ CFV      Full           Yes      Yes                          +---------+---------------+---------+-----------+----------+-------+ FV Prox  Full                                                 +---------+---------------+---------+-----------+----------+-------+ FV Mid   Full                                                 +---------+---------------+---------+-----------+----------+-------+ FV DistalFull                                                 +---------+---------------+---------+-----------+----------+-------+  PFV      Full                                                 +---------+---------------+---------+-----------+----------+-------+ POP      Full           Yes      Yes                          +---------+---------------+---------+-----------+----------+-------+ PTV      Full                                                 +---------+---------------+---------+-----------+----------+-------+ PERO     Full                                                 +---------+---------------+---------+-----------+----------+-------+    Final Interpretation: Right: There is no evidence of deep vein thrombosis in the lower extremity. No cystic structure found in the popliteal fossa. Left: There is no evidence of deep vein thrombosis in the lower extremity. No cystic structure found in the popliteal fossa.  *See table(s) above for measurements and observations. Electronically signed by Lemar Livings on 02/28/2017 at 2:00:06 PM.   Final    Echo  Study Conclusions  - Left ventricle: The cavity size was mildly dilated. Wall   thickness was normal. Diffuse hypokinesis with inferolateral   akinesis. Systolic function was severely reduced. The estimated   ejection fraction was in the range of 25% to 30%. Doppler   parameters are consistent with restrictive physiology, indicative   of decreased left ventricular diastolic compliance and/or   increased left atrial pressure. - Ventricular septum: D-shaped interventricular septum suggestive   of RV pressure/volume overload. - Aortic valve: There was no stenosis. - Mitral valve: There was moderate to severe regurgitation, suspect   infarct-related MR given inferolateral akinesis and restriction   of posterior leaflet. - Left atrium: The atrium was mildly dilated. - Right ventricle: Poorly visualized. The cavity size was normal.   Systolic function was mildly reduced. - Right atrium: The atrium was mildly dilated. - Tricuspid valve: Peak RV-RA gradient (S): 50 mm Hg. - Pulmonary arteries: PA peak pressure: 58 mm Hg (S). - Systemic veins: IVC measured 2.0 cm with < 50% respirophasic   variation, suggesting RA pressure 8 mmHg.  Impressions:  - Mildly dilated LV with EF 25-30%, diffuse hypokinesis with   inferolateral akinesis. Restrictive diastolic function. RV poorly   visualized but appears normal in size with mildly decreased   systolic function. D-shaped interventricular septum is suggestive   of RV pressure/volume overload. Moderate pulmonary hypertension.   Moderate to severe mitral regurgitation, suspect infarct-related   MR with restricted posterior leaflet and akinetic inferolateral wall.  Heart cath  Previously placed Prox Cx to Mid Cx stent (unknown type) is widely patent.  Prox Cx lesion is 45% stenosed. - proximal to the Stent -- FFR 0.88.  Prox RCA to Mid RCA stent is 55% stenosed.  Dist LAD lesion is 20% stenosed.  LV end diastolic pressure  is  severely elevated. 30-31 mmHg   Angiographically moderate lesion prior to the circumflex stent which is likely just a step up.  FFR 0.88.  Otherwise minimal CAD.  No change to the in-stent restenosis of the RCA.  Severely elevated EDP, likely related to cardiomyopathy and mitral regurgitation.  Plan: Medical management and consider possibility of mitral valve repair.   Discharge Exam: Vitals:   03/05/17 0444 03/05/17 0757  BP: (!) 105/55 98/63  Pulse: 77 79  Resp: 18 17  Temp: 98.2 F (36.8 C) 98.4 F (36.9 C)  SpO2: 100% 100%   Vitals:   03/04/17 2207 03/05/17 0307 03/05/17 0444 03/05/17 0757  BP: 111/63  (!) 105/55 98/63  Pulse: 76  77 79  Resp: 18  18 17   Temp: 98.1 F (36.7 C)  98.2 F (36.8 C) 98.4 F (36.9 C)  TempSrc: Oral  Oral Oral  SpO2: 100%  100% 100%  Weight:  100.7 kg (222 lb)    Height:        General: Pt is alert, awake, not in acute distress Cardiovascular: RRR, S1/S2 +, no rubs, no gallops, +systolic murmur  Respiratory: CTA bilaterally, no wheezing, no rhonchi Abdominal: Soft, NT, ND, bowel sounds + Extremities: no edema, no cyanosis    The results of significant diagnostics from this hospitalization (including imaging, microbiology, ancillary and laboratory) are listed below for reference.     Microbiology: No results found for this or any previous visit (from the past 240 hour(s)).   Labs: BNP (last 3 results) No results for input(s): BNP in the last 8760 hours. Basic Metabolic Panel: Recent Labs  Lab 03/01/17 0605 03/02/17 0508 03/03/17 0557 03/04/17 0643 03/05/17 0544  NA 140 140 140 141 140  K 4.7 4.6 4.4 3.7 4.3  CL 106 105 105 102 105  CO2 24 24 23 26 23   GLUCOSE 96 90 92 93 91  BUN 16 16 20 20 20   CREATININE 1.34* 1.23 1.19 1.35* 1.16  CALCIUM 9.0 9.0 8.8* 9.3 9.3   Liver Function Tests: No results for input(s): AST, ALT, ALKPHOS, BILITOT, PROT, ALBUMIN in the last 168 hours. Recent Labs  Lab 02/27/17 1959  LIPASE  27   No results for input(s): AMMONIA in the last 168 hours. CBC: Recent Labs  Lab 03/01/17 0605 03/02/17 0508 03/03/17 0557 03/04/17 0643 03/05/17 0544  WBC 10.4 8.9 8.5 9.0 10.0  HGB 12.2* 11.9* 12.0* 12.4* 13.4  HCT 39.0 38.0* 39.0 39.0 42.7  MCV 90.1 89.6 89.9 88.2 89.3  PLT 291 251 242 293 314   Cardiac Enzymes: Recent Labs  Lab 03/01/17 0848  TROPONINI <0.03   BNP: Invalid input(s): POCBNP CBG: No results for input(s): GLUCAP in the last 168 hours. D-Dimer No results for input(s): DDIMER in the last 72 hours. Hgb A1c No results for input(s): HGBA1C in the last 72 hours. Lipid Profile No results for input(s): CHOL, HDL, LDLCALC, TRIG, CHOLHDL, LDLDIRECT in the last 72 hours. Thyroid function studies No results for input(s): TSH, T4TOTAL, T3FREE, THYROIDAB in the last 72 hours.  Invalid input(s): FREET3 Anemia work up No results for input(s): VITAMINB12, FOLATE, FERRITIN, TIBC, IRON, RETICCTPCT in the last 72 hours. Urinalysis    Component Value Date/Time   COLORURINE YELLOW 02/27/2017 0452   APPEARANCEUR CLEAR 02/27/2017 0452   LABSPEC >1.046 (H) 02/27/2017 0452   PHURINE 5.0 02/27/2017 0452   GLUCOSEU NEGATIVE 02/27/2017 0452   HGBUR NEGATIVE 02/27/2017 0452   BILIRUBINUR NEGATIVE 02/27/2017 1610   Lavenia Atlas  NEGATIVE 02/27/2017 0452   PROTEINUR NEGATIVE 02/27/2017 0452   NITRITE NEGATIVE 02/27/2017 0452   LEUKOCYTESUR NEGATIVE 02/27/2017 0452   Sepsis Labs Invalid input(s): PROCALCITONIN,  WBC,  LACTICIDVEN Microbiology No results found for this or any previous visit (from the past 240 hour(s)).   Time coordinating discharge: 40 minutes  SIGNED:  Noralee Stain, DO [facility] Pager [phone]  If 7PM-7AM, please contact night-coverage [email or web address] Password [facility] 03/05/2017, 9:36 AM

## 2017-03-07 LAB — LUPUS ANTICOAGULANT PANEL
DRVVT: 113.9 s — ABNORMAL HIGH (ref 0.0–47.0)
PTT LA: 47 s (ref 0.0–51.9)

## 2017-03-07 LAB — DRVVT MIX: dRVVT Mix: 83.2 s — ABNORMAL HIGH (ref 0.0–47.0)

## 2017-03-07 LAB — DRVVT CONFIRM: dRVVT Confirm: 1.8 ratio — ABNORMAL HIGH (ref 0.8–1.2)

## 2017-03-08 ENCOUNTER — Other Ambulatory Visit (HOSPITAL_COMMUNITY): Payer: Self-pay

## 2017-03-08 DIAGNOSIS — R760 Raised antibody titer: Secondary | ICD-10-CM

## 2017-03-08 LAB — PROTHROMBIN GENE MUTATION

## 2017-03-10 ENCOUNTER — Ambulatory Visit: Payer: Medicaid Other | Attending: Internal Medicine | Admitting: Internal Medicine

## 2017-03-10 ENCOUNTER — Encounter: Payer: Self-pay | Admitting: Internal Medicine

## 2017-03-10 DIAGNOSIS — J45909 Unspecified asthma, uncomplicated: Secondary | ICD-10-CM | POA: Diagnosis not present

## 2017-03-10 DIAGNOSIS — Z79899 Other long term (current) drug therapy: Secondary | ICD-10-CM | POA: Diagnosis not present

## 2017-03-10 DIAGNOSIS — I5022 Chronic systolic (congestive) heart failure: Secondary | ICD-10-CM | POA: Diagnosis not present

## 2017-03-10 DIAGNOSIS — Z86711 Personal history of pulmonary embolism: Secondary | ICD-10-CM | POA: Diagnosis not present

## 2017-03-10 DIAGNOSIS — D6862 Lupus anticoagulant syndrome: Secondary | ICD-10-CM | POA: Diagnosis not present

## 2017-03-10 DIAGNOSIS — G4733 Obstructive sleep apnea (adult) (pediatric): Secondary | ICD-10-CM | POA: Insufficient documentation

## 2017-03-10 DIAGNOSIS — Z87891 Personal history of nicotine dependence: Secondary | ICD-10-CM | POA: Diagnosis not present

## 2017-03-10 DIAGNOSIS — I255 Ischemic cardiomyopathy: Secondary | ICD-10-CM | POA: Insufficient documentation

## 2017-03-10 DIAGNOSIS — I2510 Atherosclerotic heart disease of native coronary artery without angina pectoris: Secondary | ICD-10-CM | POA: Insufficient documentation

## 2017-03-10 DIAGNOSIS — E7800 Pure hypercholesterolemia, unspecified: Secondary | ICD-10-CM | POA: Diagnosis not present

## 2017-03-10 DIAGNOSIS — I252 Old myocardial infarction: Secondary | ICD-10-CM | POA: Insufficient documentation

## 2017-03-10 DIAGNOSIS — Z91013 Allergy to seafood: Secondary | ICD-10-CM | POA: Diagnosis not present

## 2017-03-10 LAB — FACTOR 5 LEIDEN

## 2017-03-10 NOTE — Progress Notes (Signed)
Patient comes in for f/u from hospital. He is feeling better. He reports 18 pound weight loss in the hospital. He was also dx with a PE- note he reports a fhx of PE. On review of labs it appears he has a lupus anticoagulant.   He is feeling well today without significant complaint  Past Medical History:  Diagnosis Date  . Asthma   . CAD (coronary artery disease)   . Chronic systolic CHF (congestive heart failure) (HCC)   . HLD (hyperlipidemia)    Qualifier: Diagnosis of  By: Antoine Poche, MD, Gerrit Heck    . Hypercholesteremia   . Inferior myocardial infarction [facility]) 2007   PCI of RCA  . Inferior myocardial infarction (HCC) 03/31/2008   PCI of RCA with bare metal stent  . Ischemic cardiomyopathy   . Mitral regurgitation   . Obstructive sleep apnea    Qualifier: Diagnosis of  By: Antoine Poche, MD, Gerrit Heck    . 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 Pulmonary embolism (HCC) 02/28/2017  . Tobacco abuse     Social History   Socioeconomic History  . Marital status: Married    Spouse name: Not on file  . Number of children: Not on file  . Years of education: Not on file  . Highest education level: Not on file  Social Needs  . Financial resource strain: Not on file  . Food insecurity - worry: Not on file  . Food insecurity - inability: Not on file  . Transportation needs - medical: Not on file  . Transportation needs - non-medical: Not on file  Occupational History  . Not on file  Tobacco Use  . Smoking status: Former Smoker    Packs/day: 0.50    Years: 17.00    Pack years: 8.50    Types: Cigarettes  . Smokeless tobacco: Never Used  . Tobacco comment: quit since heart attack in 10/2015  Substance and Sexual Activity  . Alcohol use: Yes    Alcohol/week: 4.8 oz    Types: 8 Shots of liquor per week  . Drug use: Yes    Types: Marijuana  . Sexual activity: Not on file  Other Topics Concern  . Not on file  Social History Narrative   The patient is married with two children.    Past Surgical History:  Procedure Laterality Date  . CARDIAC CATHETERIZATION N/A 10/20/2015   Procedure: Left Heart Cath and Coronary Angiography;  Surgeon: Kathleene Hazel, MD;  Location: [facility] INVASIVE CV LAB;  Service: Cardiovascular;  Laterality: N/A;  . CARDIAC CATHETERIZATION N/A 10/20/2015   Procedure: Coronary Stent Intervention;  Surgeon: Kathleene Hazel, MD;  Location: [facility] INVASIVE CV LAB;  Service: Cardiovascular;  Laterality: N/A;  . INTRAVASCULAR PRESSURE WIRE/FFR STUDY N/A 03/03/2017   Procedure: INTRAVASCULAR PRESSURE WIRE/FFR STUDY;  Surgeon: Marykay Lex, MD;  Location: [facility] INVASIVE CV LAB;  Service: Cardiovascular;  Laterality: N/A;  . LEFT HEART CATH AND CORONARY ANGIOGRAPHY N/A 03/03/2017   Procedure: LEFT HEART CATH AND CORONARY ANGIOGRAPHY;  Surgeon: Marykay Lex, MD;  Location: [facility] INVASIVE CV LAB;  Service: Cardiovascular;  Laterality: N/A;  . none    . OTHER SURGICAL HISTORY     NONE    Family History  Problem Relation Age of Onset  . Other Sister 61       THE PATIENT REPORTS A SISTER WITH A MYOCARDIAL INFARCTION IN HER 30s.(SHE IS NOW S/P CABG)  . Heart attack Father   .  Deep vein thrombosis Father     Allergies  Allergen Reactions  . Shellfish Allergy Anaphylaxis    Current Outpatient Medications on File Prior to Visit  Medication Sig Dispense Refill  . atorvastatin (LIPITOR) 80 MG tablet Take 1 tablet (80 mg total) by mouth daily. 30 tablet 0  . carvedilol (COREG) 3.125 MG tablet Take 1 tablet (3.125 mg total) by mouth 2 (two) times daily with a meal. 60 tablet 0  . furosemide (LASIX) 40 MG tablet Take 1 tablet (40 mg total) by mouth daily. 30 tablet 0  . losartan (COZAAR) 25 MG tablet Take 1 tablet (25 mg total) by mouth daily. 30 tablet 0  . nitroGLYCERIN (NITROSTAT) 0.4 MG SL tablet Place 1 tablet (0.4 mg total) under the tongue every 5 (five) minutes as needed for chest pain. 25 tablet 2  .  Rivaroxaban 15 & 20 MG TBPK Take as directed on package: Start with one 15mg  tablet by mouth twice a day with food. On Day 22, switch to one 20mg  tablet once a day with food. 51 each 0  . spironolactone (ALDACTONE) 25 MG tablet Take 1 tablet (25 mg total) by mouth daily. 30 tablet 0   No current facility-administered medications on file prior to visit.      patient denies chest pain, shortness of breath, orthopnea. Denies lower extremity edema, abdominal pain, change in appetite, change in bowel movements. Patient denies rashes, musculoskeletal complaints. No other specific complaints in a complete review of systems.   BP 105/69   Pulse 75   Temp 98.4 F (36.9 C) (Oral)   Resp 16   Wt 229 lb (103.9 kg)   SpO2 97%   BMI 31.94 kg/m   well-developed well-nourished male in no acute distress. HEENT exam atraumatic, normocephalic, neck supple without jugular venous distention. Chest clear to auscultation cardiac exam S1-S2 are regular. No significant murmur Abdominal exam overweight with bowel sounds, soft and nontender. Extremities no edema. Neurologic exam is alert with a normal gait.   A/p- ischemic cardiomyopathy with CHF (ef 25%). He appears well and clinically is stable He has f/u with CV next week. Discussed the need for absolute medicine adherence.  He also understands his diet and need for daily weights.   PE- has lupus AC- continue NOAC- stressed the importance of abslute compliance  F/u one month

## 2017-03-10 NOTE — Patient Instructions (Signed)
You have multiple medical problems and are not able to work due to congestive heart failure and pulmonary emboli. There may be a time in the future (perhaps 3 months) that you may be physically fit to seek employment.   Continue your current medications. Follow up with your cardiologists and we will see you back here in one month

## 2017-03-16 ENCOUNTER — Ambulatory Visit (HOSPITAL_COMMUNITY)
Admission: RE | Admit: 2017-03-16 | Discharge: 2017-03-16 | Disposition: A | Discharge status: Home / Self Care | Payer: Medicaid Other | Source: Ambulatory Visit | Attending: Cardiology | Admitting: Cardiology

## 2017-03-16 ENCOUNTER — Encounter (HOSPITAL_COMMUNITY): Payer: Self-pay | Admitting: Pharmacist

## 2017-03-16 ENCOUNTER — Encounter (HOSPITAL_COMMUNITY): Payer: Self-pay

## 2017-03-16 VITALS — BP 98/56 | HR 81 | Wt 233.8 lb

## 2017-03-16 DIAGNOSIS — I2510 Atherosclerotic heart disease of native coronary artery without angina pectoris: Secondary | ICD-10-CM

## 2017-03-16 DIAGNOSIS — I272 Pulmonary hypertension, unspecified: Secondary | ICD-10-CM | POA: Diagnosis not present

## 2017-03-16 DIAGNOSIS — Z8249 Family history of ischemic heart disease and other diseases of the circulatory system: Secondary | ICD-10-CM | POA: Insufficient documentation

## 2017-03-16 DIAGNOSIS — F17200 Nicotine dependence, unspecified, uncomplicated: Secondary | ICD-10-CM

## 2017-03-16 DIAGNOSIS — Z87891 Personal history of nicotine dependence: Secondary | ICD-10-CM | POA: Insufficient documentation

## 2017-03-16 DIAGNOSIS — I340 Nonrheumatic mitral (valve) insufficiency: Secondary | ICD-10-CM

## 2017-03-16 DIAGNOSIS — Z7901 Long term (current) use of anticoagulants: Secondary | ICD-10-CM | POA: Diagnosis not present

## 2017-03-16 DIAGNOSIS — Z955 Presence of coronary angioplasty implant and graft: Secondary | ICD-10-CM | POA: Diagnosis not present

## 2017-03-16 DIAGNOSIS — I5022 Chronic systolic (congestive) heart failure: Secondary | ICD-10-CM | POA: Diagnosis not present

## 2017-03-16 DIAGNOSIS — Z86711 Personal history of pulmonary embolism: Secondary | ICD-10-CM | POA: Diagnosis not present

## 2017-03-16 DIAGNOSIS — Z832 Family history of diseases of the blood and blood-forming organs and certain disorders involving the immune mechanism: Secondary | ICD-10-CM | POA: Insufficient documentation

## 2017-03-16 DIAGNOSIS — G4733 Obstructive sleep apnea (adult) (pediatric): Secondary | ICD-10-CM | POA: Diagnosis not present

## 2017-03-16 DIAGNOSIS — E7800 Pure hypercholesterolemia, unspecified: Secondary | ICD-10-CM | POA: Diagnosis not present

## 2017-03-16 DIAGNOSIS — I255 Ischemic cardiomyopathy: Secondary | ICD-10-CM

## 2017-03-16 DIAGNOSIS — J45909 Unspecified asthma, uncomplicated: Secondary | ICD-10-CM | POA: Diagnosis not present

## 2017-03-16 DIAGNOSIS — Z9889 Other specified postprocedural states: Secondary | ICD-10-CM | POA: Diagnosis not present

## 2017-03-16 DIAGNOSIS — I252 Old myocardial infarction: Secondary | ICD-10-CM | POA: Diagnosis not present

## 2017-03-16 DIAGNOSIS — Z79899 Other long term (current) drug therapy: Secondary | ICD-10-CM | POA: Diagnosis not present

## 2017-03-16 DIAGNOSIS — I2699 Other pulmonary embolism without acute cor pulmonale: Secondary | ICD-10-CM

## 2017-03-16 LAB — BASIC METABOLIC PANEL
Anion gap: 9 (ref 5–15)
BUN: 19 mg/dL (ref 6–20)
CHLORIDE: 107 mmol/L (ref 101–111)
CO2: 23 mmol/L (ref 22–32)
CREATININE: 1.03 mg/dL (ref 0.61–1.24)
Calcium: 9.5 mg/dL (ref 8.9–10.3)
GFR calc Af Amer: >60 mL/min (ref >60)
GFR calc non Af Amer: >60 mL/min (ref >60)
Glucose, Bld: 104 mg/dL — ABNORMAL HIGH (ref 65–99)
Potassium: 4.4 mmol/L (ref 3.5–5.1)
Sodium: 139 mmol/L (ref 135–145)

## 2017-03-16 LAB — BRAIN NATRIURETIC PEPTIDE: B Natriuretic Peptide: 126 pg/mL — ABNORMAL HIGH (ref 0.0–100.0)

## 2017-03-16 MED ORDER — ATORVASTATIN CALCIUM 80 MG PO TABS: 80.0000 mg | ORAL_TABLET | Freq: Every day | ORAL | 2 refills | Status: DC

## 2017-03-16 MED ORDER — CARVEDILOL 3.125 MG PO TABS: 3.1250 mg | ORAL_TABLET | Freq: Two times a day (BID) | ORAL | 2 refills | Status: DC

## 2017-03-16 MED ORDER — RIVAROXABAN 20 MG PO TABS: 20.0000 mg | ORAL_TABLET | Freq: Every day | ORAL | 2 refills | Status: DC

## 2017-03-16 MED ORDER — SPIRONOLACTONE 25 MG PO TABS: 25.0000 mg | ORAL_TABLET | Freq: Every day | ORAL | 2 refills | Status: DC

## 2017-03-16 MED ORDER — LOSARTAN POTASSIUM 25 MG PO TABS: 25.0000 mg | ORAL_TABLET | Freq: Every day | ORAL | 2 refills | Status: DC

## 2017-03-16 MED ORDER — RIVAROXABAN 20 MG PO TABS: 20.0000 mg | ORAL_TABLET | Freq: Every day | ORAL | 3 refills | Status: DC

## 2017-03-16 MED ORDER — FUROSEMIDE 40 MG PO TABS: 40.0000 mg | ORAL_TABLET | Freq: Every day | ORAL | 2 refills | Status: DC

## 2017-03-16 NOTE — Progress Notes (Signed)
Heart Failure Clinic Social Work Assessment  Sprint Nextel CorporationKaleff H May  presents today in association with an Advanced Heart Failure Clinic Appointment.  Patient seen today by CSW for follow up/assistance on Financial difficulties Health problems and/or  Finances and insurance.   Social Determinants impacting successful heart failure regimen:  Housing: patient lives with wife and two children. Food: Do you have enough food? Yes  Do you know and understand healthy eating and how that affects your heart failure diagnosis? Yes  Do you follow a low salt diet?  Yes  Utilities: Do you have gas and/or electricity on in your home? Yes  Income: What is your source of income? Will apply for SSD Insurance: pending medicaid Transportation: Do you have transportation to your medical appointments?Yes  If yes, how? wife    Daily Health Needs: Do you have a scale and weigh each day?  Yes  Do you have your medications? Yes  Are you able to adhere to your medication regimen? Yes   Do you ever take medications differently than prescribed? No  Do you know the zones of Heart Failure?  Yes  Do you know how to contact the HF Clinic appropriately with worsening symptoms or weight increases?  Yes    Do you have any identified obstacles / challenges for adherence to current treatment plan?Yes  Finances and insurance  Patient appears overwhelmed with his current health and lack of community resources to help improve his health.    CSW assisted patient with [employer] with the goal to added resources for improved health. CSW referred patient to Social Security for application for SSD.   Heart Failure Clinic Social Worker will continue to coordinate and monitor patient's treatment plan as needed.  CSW continues to be available for identified needs. Joshua BeechJackie Antoni Stefan, LCSW, CCSW-MCS [phone]

## 2017-03-16 NOTE — Progress Notes (Signed)
PCP: None  Primary Cardiologist: Dr Antoine Poche HF MD: Dr Shirlee Latch   HPI: Savage is a40 y.o.maleAA male with a history of CAD dating back to 2007 when [place] in [place]. In 2010 he presented with a STEMI and had an RCA DES placed. He followed up for a year but then didn't present again till Oct 2017 when he presented with a CFX infarct. Cath revealed 50% ISR of the RCA, total mCFX and 85% OM3. He had CFX PCI with DES and OM3 POBA. His EF then was 50-55%.  Admitted 02/27/17 with increased dyspnea and chest pain. CTA confirmed PE. ECHO completed and showed reduced EF 20-25%. Underwent LHC as noted below. [place] no evidence DVTs. CT surgery consulted for severe MR. Plan to optimize HF medications.Set up TEE as an outpatient. Discharge weight 222 pounds.   Today he returns for HF follow up. Overall feeling just ok. SOB with exertion. Had to stop walking up steps to his bedroom. Gets tired after walking 5-10 minutes. Denies PND/Orthopnea.No chest pain  No bleeding problems. Appetite ok. No fever or chills. Weight at home has gone up from  222-228 pounds.  Taking all medications but has difficulty paying for them. He is not working and he is trying to get Medicaid.   ECHO 03/01/2017  Mildly dilated LV with EF 25-30%, diffuse hypokinesis with   inferolateral akinesis. Restrictive diastolic function. RV poorly   visualized but appears normal in size with mildly decreased   systolic function. D-shaped interventricular septum is suggestive   of RV pressure/volume overload. Moderate pulmonary hypertension.   Moderate to severe mitral regurgitation, suspect infarct-related   MR with restricted posterior leaflet and akinetic inferolateral   wall.  LHC 03/03/2017   Previously placed Prox Cx to Mid Cx stent (unknown type) is widely patent.  Prox Cx lesion is 45% stenosed. - proximal to the Stent -- FFR 0.88.  Prox RCA to Mid RCA stent is 55% stenosed.  Dist LAD lesion is 20% stenosed.  LV end  diastolic pressure is severely elevated. 30-31 mmHg   ROS: All systems negative except as listed in HPI, PMH and Problem List.  SH:  Social History   Socioeconomic History  . Marital status: Married    Spouse name: Not on file  . Number of children: Not on file  . Years of education: Not on file  . Highest education level: Not on file  Social Needs  . Financial resource strain: Not on file  . Food insecurity - worry: Not on file  . Food insecurity - inability: Not on file  . Transportation needs - medical: Not on file  . Transportation needs - non-medical: Not on file  Occupational History  . Not on file  Tobacco Use  . Smoking status: Former Smoker    Packs/day: 0.50    Years: 17.00    Pack years: 8.50    Types: Cigarettes  . Smokeless tobacco: Never Used  . Tobacco comment: quit since heart attack in 10/2015  Substance and Sexual Activity  . Alcohol use: Yes    Alcohol/week: 4.8 oz    Types: 8 Shots of liquor per week  . Drug use: Yes    Types: Marijuana  . Sexual activity: Not on file  Other Topics Concern  . Not on file  Social History Narrative   The patient is married with two children.    FH:  Family History  Problem Relation Age of Onset  . Other Sister 83  THE PATIENT REPORTS A SISTER WITH A MYOCARDIAL INFARCTION IN HER 30s.(SHE IS NOW S/P CABG)  . Heart attack Father   . Deep vein thrombosis Father     Past Medical History:  Diagnosis Date  . Asthma   . CAD (coronary artery disease)   . Chronic systolic CHF (congestive heart failure) (HCC)   . HLD (hyperlipidemia)    Qualifier: Diagnosis of  By: Antoine PocheHochrein, MD, Gerrit HeckFACC, James    . Hypercholesteremia   . Inferior myocardial infarction [facility](HCC) 2007   PCI of RCA  . Inferior myocardial infarction (HCC) 03/31/2008   PCI of RCA with bare metal stent  . Ischemic cardiomyopathy   . Mitral regurgitation   . Obstructive sleep apnea    Qualifier: Diagnosis of  By: Antoine PocheHochrein, MD, Gerrit HeckFACC, James    .  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 Pulmonary embolism (HCC) 02/28/2017  . Tobacco abuse     Current Outpatient Medications  Medication Sig Dispense Refill  . atorvastatin (LIPITOR) 80 MG tablet Take 1 tablet (80 mg total) by mouth daily. 30 tablet 0  . carvedilol (COREG) 3.125 MG tablet Take 1 tablet (3.125 mg total) by mouth 2 (two) times daily with a meal. 60 tablet 0  . furosemide (LASIX) 40 MG tablet Take 1 tablet (40 mg total) by mouth daily. 30 tablet 0  . losartan (COZAAR) 25 MG tablet Take 1 tablet (25 mg total) by mouth daily. 30 tablet 0  . Rivaroxaban 15 & 20 MG TBPK Take as directed on package: Start with one 15mg  tablet by mouth twice a day with food. On Day 22, switch to one 20mg  tablet once a day with food. 51 each 0  . spironolactone (ALDACTONE) 25 MG tablet Take 1 tablet (25 mg total) by mouth daily. 30 tablet 0  . nitroGLYCERIN (NITROSTAT) 0.4 MG SL tablet Place 1 tablet (0.4 mg total) under the tongue every 5 (five) minutes as needed for chest pain. (Patient not taking: Reported on 03/16/2017) 25 tablet 2   No current facility-administered medications for this encounter.     Vitals:   03/16/17 0914  BP: (!) 98/56  Pulse: 81  SpO2: 98%  Weight: 233 lb 12.8 oz (106.1 kg)    PHYSICAL EXAM: General:  No resp difficulty. Walked slowly in the clinic with his wife. Marland Kitchen.  HEENT: normal Neck: supple. JVP 9-10.  Carotids 2+ bilaterally; no bruits. No lymphadenopathy or thryomegaly appreciated. Cor: PMI normal. Regular rate & rhythm. No rubs, gallops or murmurs. Lungs: clear Abdomen: soft, nontender, nondistended. No hepatosplenomegaly. No bruits or masses. Good bowel sounds. Extremities: no cyanosis, clubbing, rash, edema Neuro: alert & orientedx3, cranial nerves grossly intact. Moves all 4 extremities w/o difficulty. Affect pleasant.   ASSESSMENT & PLAN: 1. Chronic Systolic Heart Failure, ICM ECHO 02/2017 25-30%.  NYHAIIiB.  Volume status elevated. Instructed to take extra lasix for next 2 days.  Continue carvedilol 3.125 mg twice a day.  Continue losartan 25 mg daily.  Continue spironolactone 25 mg daily.  Plan to set up TEE at the next visit.  Check BMET.  2. CAD Stents in LCX and RCA. LHC cath 02/2017 nonobstructive disease.  No S/S ischemia  Continue statin, bb.  On xarelto so no aspirin.  3. PE - CTA 02/2017 with PE note.  Check anticardiolipin and lupus antigen.  ? Etiology. Father has venous atheroembolism.  + lupus anticoagulant . Resent today.  Likely need life long anticoagulation. Continue xarelto.  4. Mitral Regurgitation ? Infarct. Secondary MR.  Plan to set up TEE at next visit.  5. Former Pension scheme manager on smoking cessation.   Follow up in 2 weeks. Check BMET   Referred to HFSW for assistance with medications as wells HF Pharmacist. Assistance application have been completed. Medicaid application pending.   Greater than 50% of the (total minutes 30) visit spent in counseling/coordination of care regarding medication changes, low salt food choices, and the plan for the next months.   Amy Clegg NP-C  2:22 PM

## 2017-03-16 NOTE — Progress Notes (Signed)
Advanced Heart Failure Medication Review by a Pharmacist  Does the patient  feel that his/her medications are working for him/her?  yes  Has the patient been experiencing any side effects to the medications prescribed?  no  Does the patient measure his/her own blood pressure or blood glucose at home?  no   Does the patient have any problems obtaining medications due to transportation or finances?   Yes - [name] family planning only   Understanding of regimen: fair Understanding of indications: fair Potential of compliance: good Patient understands to avoid NSAIDs. Patient understands to avoid decongestants.  Issues to address at subsequent visits: Xarelto patient assistance application status + [place] Medicaid status   Pharmacist comments: [name] is a 42 yo M presenting with his wife and a discharge medication list. He reports good compliance with his regimen and his only complaint is that he has gained about 6 pounds since discharge. He did not have any other medication-related questions or concerns for me at this time.   Joshua DeisErika K. Bonnye May, PharmD, BCPS, CPP Clinical Pharmacist Phone: [phone] 03/16/2017 9:33 AM      Time with patient: 14 minutes Preparation and documentation time: 10 minutes Total time: 24 minutes

## 2017-03-16 NOTE — Patient Instructions (Signed)
Routine lab work today. Will notify you of abnormal results, otherwise no news is good news!  Take EXTRA 20 mg Lasix tablet today and tomorrow only.  Follow up 2 weeks with Amy Clegg NP-C.  Take all medication as prescribed the day of your appointment. Bring all medications with you to your appointment.  Do the following things EVERYDAY: 1) Weigh yourself in the morning before breakfast. Write it down and keep it in a log. 2) Take your medicines as prescribed 3) Eat low salt foods-Limit salt (sodium) to 2000 mg per day.  4) Stay as active as you can everyday 5) Limit all fluids for the day to less than 2 liters

## 2017-03-17 ENCOUNTER — Telehealth (HOSPITAL_COMMUNITY): Payer: Self-pay

## 2017-03-17 DIAGNOSIS — R899 Unspecified abnormal finding in specimens from other organs, systems and tissues: Secondary | ICD-10-CM

## 2017-03-17 LAB — CARDIOLIPIN ANTIBODIES, IGG, IGM, IGA
ANTICARDIOLIPIN IGM: 10 [MPL'U]/mL (ref 0–12)
Anticardiolipin IgA: <9 APL U/mL (ref 0–11)
Anticardiolipin IgG: 26 GPL U/mL — ABNORMAL HIGH (ref 0–14)

## 2017-03-17 LAB — DRVVT MIX: dRVVT Mix: 96.5 s — ABNORMAL HIGH (ref 0.0–47.0)

## 2017-03-17 LAB — DRVVT CONFIRM: dRVVT Confirm: 1.9 ratio — ABNORMAL HIGH (ref 0.8–1.2)

## 2017-03-17 LAB — LUPUS ANTICOAGULANT PANEL
DRVVT: 147.3 s — ABNORMAL HIGH (ref 0.0–47.0)
PTT LA: 51.7 s (ref 0.0–51.9)

## 2017-03-17 NOTE — Telephone Encounter (Signed)
Result Notes for dRVVT Confirm   Notes recorded by Chyrl CivatteBradley, Mirian Casco G, RN on 03/17/2017 at 2:05 PM EST Patient aware and agreeable to referral ------  Notes recorded by Sherald Hesslegg, Amy D, NP on 03/17/2017 at 9:05 AM EST Discussed with Dr McLean---> Refer to Dr Cyndie ChimeGranfortuna for further evaluation. PE ? Lupus.

## 2017-03-25 ENCOUNTER — Telehealth: Payer: Self-pay | Admitting: Cardiology

## 2017-03-25 NOTE — Telephone Encounter (Signed)
Mr. Joshua PlunkBest called the answering service to discuss a medication. He saw a report on the news about a losartan recall. He just received his current bottle of medication a few weeks ago and is unlikely to have gotten a recalled med. I advised that if he is concerned he should call or go to his pharmacy and have the pharmacist check as they will have access to the lot number. I explained that we can change the med if it is on the recall list and cannot be replaced. He is reassured.   Berton BonJanine Natalio Salois, AGNP-C [facility] 03/25/2017  6:17 PM

## 2017-03-29 ENCOUNTER — Telehealth (HOSPITAL_COMMUNITY): Payer: Self-pay

## 2017-03-29 NOTE — Telephone Encounter (Signed)
CHF Clinic appointment reminder call placed to patient for upcoming follow up appt.  Does understand purpose of this appointment and where CHF Clinic is located? Yes  How is patient feeling? Well, no complaints  Does patient have all of their medications? Yes  Patient also reminded to take all medications as prescribed on the day of his/her appointment and to bring all medications to this appointment.  Advised to call our office for tardiness or cancellations/rescheduling needs.  .Bradley, Megan Genevea  

## 2017-03-30 ENCOUNTER — Encounter (HOSPITAL_COMMUNITY): Payer: Self-pay

## 2017-03-30 ENCOUNTER — Ambulatory Visit (HOSPITAL_COMMUNITY)
Admission: RE | Admit: 2017-03-30 | Discharge: 2017-03-30 | Disposition: A | Discharge status: Home / Self Care | Payer: Self-pay | Source: Ambulatory Visit | Attending: Cardiology | Admitting: Cardiology

## 2017-03-30 VITALS — BP 106/72 | HR 72 | Wt 238.2 lb

## 2017-03-30 DIAGNOSIS — Z86711 Personal history of pulmonary embolism: Secondary | ICD-10-CM | POA: Insufficient documentation

## 2017-03-30 DIAGNOSIS — I2699 Other pulmonary embolism without acute cor pulmonale: Secondary | ICD-10-CM

## 2017-03-30 DIAGNOSIS — J45909 Unspecified asthma, uncomplicated: Secondary | ICD-10-CM | POA: Insufficient documentation

## 2017-03-30 DIAGNOSIS — E785 Hyperlipidemia, unspecified: Secondary | ICD-10-CM

## 2017-03-30 DIAGNOSIS — Z7901 Long term (current) use of anticoagulants: Secondary | ICD-10-CM | POA: Insufficient documentation

## 2017-03-30 DIAGNOSIS — I340 Nonrheumatic mitral (valve) insufficiency: Secondary | ICD-10-CM | POA: Insufficient documentation

## 2017-03-30 DIAGNOSIS — I5022 Chronic systolic (congestive) heart failure: Secondary | ICD-10-CM | POA: Insufficient documentation

## 2017-03-30 DIAGNOSIS — I255 Ischemic cardiomyopathy: Secondary | ICD-10-CM | POA: Insufficient documentation

## 2017-03-30 DIAGNOSIS — Z8249 Family history of ischemic heart disease and other diseases of the circulatory system: Secondary | ICD-10-CM | POA: Insufficient documentation

## 2017-03-30 DIAGNOSIS — R079 Chest pain, unspecified: Secondary | ICD-10-CM | POA: Insufficient documentation

## 2017-03-30 DIAGNOSIS — I272 Pulmonary hypertension, unspecified: Secondary | ICD-10-CM | POA: Insufficient documentation

## 2017-03-30 DIAGNOSIS — I252 Old myocardial infarction: Secondary | ICD-10-CM | POA: Insufficient documentation

## 2017-03-30 DIAGNOSIS — R9431 Abnormal electrocardiogram [ECG] [EKG]: Secondary | ICD-10-CM | POA: Insufficient documentation

## 2017-03-30 DIAGNOSIS — E7800 Pure hypercholesterolemia, unspecified: Secondary | ICD-10-CM | POA: Insufficient documentation

## 2017-03-30 DIAGNOSIS — I25810 Atherosclerosis of coronary artery bypass graft(s) without angina pectoris: Secondary | ICD-10-CM

## 2017-03-30 DIAGNOSIS — I2510 Atherosclerotic heart disease of native coronary artery without angina pectoris: Secondary | ICD-10-CM | POA: Insufficient documentation

## 2017-03-30 DIAGNOSIS — G4733 Obstructive sleep apnea (adult) (pediatric): Secondary | ICD-10-CM | POA: Insufficient documentation

## 2017-03-30 DIAGNOSIS — F419 Anxiety disorder, unspecified: Secondary | ICD-10-CM | POA: Insufficient documentation

## 2017-03-30 DIAGNOSIS — Z87891 Personal history of nicotine dependence: Secondary | ICD-10-CM | POA: Insufficient documentation

## 2017-03-30 DIAGNOSIS — Z79899 Other long term (current) drug therapy: Secondary | ICD-10-CM | POA: Insufficient documentation

## 2017-03-30 LAB — BASIC METABOLIC PANEL
ANION GAP: 8 (ref 5–15)
BUN: 11 mg/dL (ref 6–20)
CO2: 25 mmol/L (ref 22–32)
Calcium: 9 mg/dL (ref 8.9–10.3)
Chloride: 108 mmol/L (ref 101–111)
Creatinine, Ser: 1.05 mg/dL (ref 0.61–1.24)
Glucose, Bld: 101 mg/dL — ABNORMAL HIGH (ref 65–99)
POTASSIUM: 4.2 mmol/L (ref 3.5–5.1)
SODIUM: 141 mmol/L (ref 135–145)

## 2017-03-30 LAB — BRAIN NATRIURETIC PEPTIDE: B NATRIURETIC PEPTIDE 5: 115.6 pg/mL — AB (ref 0.0–100.0)

## 2017-03-30 MED ORDER — FUROSEMIDE 80 MG PO TABS: 80.0000 mg | ORAL_TABLET | Freq: Every day | ORAL | 2 refills | Status: DC

## 2017-03-30 MED ORDER — FUROSEMIDE 10 MG/ML IJ SOLN
80.0000 mg | Freq: Once | INTRAMUSCULAR | Status: AC
  Administered 2017-03-30: 80 mg via INTRAVENOUS
  Filled 2017-03-30: qty 8

## 2017-03-30 MED ORDER — FUROSEMIDE 40 MG PO TABS: 40.0000 mg | ORAL_TABLET | Freq: Every day | ORAL | 2 refills | Status: DC

## 2017-03-30 MED FILL — SPIRONOLACTONE 25 MG TABLET: 25 | 30 days supply | Qty: 30 | Fill #0

## 2017-03-30 MED FILL — XARELTO 20 MG TABLET: 20 | 30 days supply | Qty: 30 | Fill #0

## 2017-03-30 MED FILL — ATORVASTATIN 80 MG TABLET: 80 | 30 days supply | Qty: 30 | Fill #0

## 2017-03-30 MED FILL — LOSARTAN POTASSIUM 25 MG TA: 25 | 30 days supply | Qty: 30 | Fill #0

## 2017-03-30 MED FILL — FUROSEMIDE 80 MG TABLET: 80 | 30 days supply | Qty: 30 | Fill #0

## 2017-03-30 MED FILL — CARVEDILOL 3.125 MG TABLET: 3.125 | 30 days supply | Qty: 60 | Fill #0

## 2017-03-30 NOTE — Progress Notes (Addendum)
PIV 22g x 1 attempt placed to LLW. 80 mg IV lasix x 1 dose administered over 2 minutes, saline locked. Patient tolerated well. Family member, call bell, and urinal in reach.  Total UOP: 725 cc clear amber  PIV removed before patient DC'D from OV.  Ave FilterBradley, Georgann Bramble Genevea, RN

## 2017-03-30 NOTE — Patient Instructions (Addendum)
DECREASE Lasix to 80 mg once daily.  Routine lab work today. Will notify you of abnormal results, otherwise no news is good news!  Follow up this Friday. Garage Code: 9002  Take all medication as prescribed the day of your appointment. Bring all medications with you to your appointment.  Do the following things EVERYDAY: 1) Weigh yourself in the morning before breakfast. Write it down and keep it in a log. 2) Take your medicines as prescribed 3) Eat low salt foods-Limit salt (sodium) to 2000 mg per day.  4) Stay as active as you can everyday 5) Limit all fluids for the day to less than 2 liters

## 2017-03-30 NOTE — Progress Notes (Signed)
ReDS Vest - 03/30/17 1000      ReDS Vest   MR   Severe    Fitting Posture  Standing    Height Marker  Tall    Ruler Value  11    Center Strip  Shifted    ReDS Value  46

## 2017-03-30 NOTE — Progress Notes (Signed)
PCP: None  Primary Cardiologist: Dr Antoine Poche HF MD: Dr Shirlee Latch   HPI: Joshua May is a62 y.o.maleAA male with a history of CAD dating back to 2007 when he had [place] in [place]. In 2010 he presented with a STEMI and had an RCA DES placed. He followed up for a year but then didn't present again till Oct 2017 when he presented with a CFX infarct. Cath revealed 50% ISR of the RCA, total mCFX and 85% OM3. He had CFX PCI with DES and OM3 POBA. His EF then was 50-55%.  Admitted 02/27/17 with increased dyspnea and chest pain. CTA confirmed PE. ECHO completed and showed reduced EF 20-25%. Underwent LHC as noted below. [place] no evidence DVTs. CT surgery consulted for severe MR. Plan to optimize HF medications.Set up TEE as an outpatient. Discharge weight 222 pounds.   Today he returns for HF follow up. Last visit lasix was increased for 2 days. Overall feeling fine. SOB with steps. Denies PND/Orthopnea. Has had some chest pain that he thinks is associated with anxiety. Weight at home trending up from 224-231 pounds.  Says he is gaining about 1 pound a day. Appetite ok. Tries to follow low salt diet and limit fluid intake. No fever or chills.  Taking all medications.   ECHO 03/01/2017  Mildly dilated LV with EF 25-30%, diffuse hypokinesis with   inferolateral akinesis. Restrictive diastolic function. RV poorly   visualized but appears normal in size with mildly decreased   systolic function. D-shaped interventricular septum is suggestive   of RV pressure/volume overload. Moderate pulmonary hypertension.   Moderate to severe mitral regurgitation, suspect infarct-related   MR with restricted posterior leaflet and akinetic inferolateral   wall.  LHC 03/03/2017   Previously placed Prox Cx to Mid Cx stent (unknown type) is widely patent.  Prox Cx lesion is 45% stenosed. - proximal to the Stent -- FFR 0.88.  Prox RCA to Mid RCA stent is 55% stenosed.  Dist LAD lesion is 20% stenosed.  LV end diastolic  pressure is severely elevated. 30-31 mmHg     ROS: All systems negative except as listed in HPI, PMH and Problem List.  SH:  Social History   Socioeconomic History  . Marital status: Married    Spouse name: Not on file  . Number of children: Not on file  . Years of education: Not on file  . Highest education level: Not on file  Social Needs  . Financial resource strain: Not on file  . Food insecurity - worry: Not on file  . Food insecurity - inability: Not on file  . Transportation needs - medical: Not on file  . Transportation needs - non-medical: Not on file  Occupational History  . Not on file  Tobacco Use  . Smoking status: Former Smoker    Packs/day: 0.50    Years: 17.00    Pack years: 8.50    Types: Cigarettes  . Smokeless tobacco: Never Used  . Tobacco comment: quit since heart attack in 10/2015  Substance and Sexual Activity  . Alcohol use: Yes    Alcohol/week: 4.8 oz    Types: 8 Shots of liquor per week  . Drug use: Yes    Types: Marijuana  . Sexual activity: Not on file  Other Topics Concern  . Not on file  Social History Narrative   The patient is married with two children.    FH:  Family History  Problem Relation Age of Onset  . Other Sister 22  THE PATIENT REPORTS A SISTER WITH A MYOCARDIAL INFARCTION IN HER 30s.(SHE IS NOW S/P CABG)  . Heart attack Father   . Deep vein thrombosis Father     Past Medical History:  Diagnosis Date  . Asthma   . CAD (coronary artery disease)   . Chronic systolic CHF (congestive heart failure) (HCC)   . HLD (hyperlipidemia)    Qualifier: Diagnosis of  By: Antoine Poche, MD, Gerrit Heck    . Hypercholesteremia   . Inferior myocardial infarction [facility]) 2007   PCI of RCA  . Inferior myocardial infarction (HCC) 03/31/2008   PCI of RCA with bare metal stent  . Ischemic cardiomyopathy   . Mitral regurgitation   . Obstructive sleep apnea    Qualifier: Diagnosis of  By: Antoine Poche, MD, Gerrit Heck    .  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 Pulmonary embolism (HCC) 02/28/2017  . Tobacco abuse     Current Outpatient Medications  Medication Sig Dispense Refill  . atorvastatin (LIPITOR) 80 MG tablet Take 1 tablet (80 mg total) by mouth daily. 30 tablet 2  . carvedilol (COREG) 3.125 MG tablet Take 1 tablet (3.125 mg total) by mouth 2 (two) times daily with a meal. 60 tablet 2  . furosemide (LASIX) 40 MG tablet Take 1 tablet (40 mg total) by mouth daily. 30 tablet 2  . losartan (COZAAR) 25 MG tablet Take 1 tablet (25 mg total) by mouth daily. 30 tablet 2  . rivaroxaban (XARELTO) 20 MG TABS tablet Take 1 tablet (20 mg total) by mouth daily with supper. 30 tablet 2  . spironolactone (ALDACTONE) 25 MG tablet Take 1 tablet (25 mg total) by mouth daily. 30 tablet 2  . nitroGLYCERIN (NITROSTAT) 0.4 MG SL tablet Place 1 tablet (0.4 mg total) under the tongue every 5 (five) minutes as needed for chest pain. (Patient not taking: Reported on 03/16/2017) 25 tablet 2   No current facility-administered medications for this encounter.     Vitals:   03/30/17 1013  BP: 106/72  Pulse: 72  SpO2: 100%  Weight: 238 lb 3.2 oz (108 kg)   ReDS Vest - 03/30/17 1000      ReDS Vest   MR   Severe    Fitting Posture  Standing    Height Marker  Tall    Ruler Value  11    Center Strip  Shifted    ReDS Value  46       PHYSICAL EXAM: General:  Well appearing. No resp difficulty.  HEENT: normal Neck: supple. JVP ~10  Carotids 2+ bilat; no bruits. No lymphadenopathy or thryomegaly appreciated. Cor: PMI nondisplaced. Regular rate & rhythm. No rubs, gallops or murmurs. Lungs: clear Abdomen: soft, nontender, distended. No hepatosplenomegaly. No bruits or masses. Good bowel sounds. Extremities: no cyanosis, clubbing, rash, edema Neuro: alert & orientedx3, cranial nerves grossly intact. moves all 4 extremities w/o difficulty. Affect pleasant  EKG: NSR 71 bpm    ASSESSMENT & PLAN: 1. Acute/.Chronic Systolic Heart Failure, ICM ECHO 02/2017 25-30%.  NYHA II-III. Volume overloaded today. Weight has been going up 1 pound a day. Reds Vest 46%. Give 80 mg IV lasix now with ~ 800 cc of urine.Check BMET/BNP .   Increase home lasix to 80 mg po daily.  Continue carvedilol 3.125 mg twice a day.  Continue losartan 25 mg daily.  Continue spironolactone 25 mg daily.  2. CAD Stents in LCX and RCA. LHC cath 02/2017 nonobstructive disease.  No S/S ischemia. EKG ok. Continue statin, bb.  On xarelto so no aspirin.  3. PE - CTA 02/2017 with PE note.  Check anticardiolipin and lupus antigen. Concerning for Lupus. Refer to Dr Cyndie ChimeGranfortuna.   ? Etiology. Father has venous atheroembolism.  + lupus anticoagulant . Referred to Dr Cyndie ChimeGranfortuna.  Likely need life long anticoagulation. Continue xarelto.  4. Mitral Regurgitation ? Infarct. Secondary MR.  Volume overloaded will set up TEE after volume status better. Hopefully will set up next time.  . 5. Former Pension scheme managermoker Congratulated on smoking cessation.   Follow up later this week. Check Reds Vest at that time.  Greater than 50% of the (total minutes 50) visit spent in counseling/coordination of care regarding medications changes, IV lasix, and limiting fluid intake to< 2 liters/low salt food choices.     Amy Clegg NP-C  10:22 AM

## 2017-03-31 ENCOUNTER — Telehealth (HOSPITAL_COMMUNITY): Payer: Self-pay | Admitting: Pharmacist

## 2017-03-31 ENCOUNTER — Ambulatory Visit: Payer: Medicaid Other | Attending: Family Medicine

## 2017-03-31 NOTE — Telephone Encounter (Signed)
[employer] patient assistance approved for Xarelto 20 mg daily through 03/25/18.   ID: [id number] BI: [id number] GRP: [id number]  Ellora Varnum K. Bonnye FavaNicolsen, PharmD, BCPS, CPP Clinical Pharmacist Phone: [phone] 03/31/2017 11:12 AM

## 2017-04-01 NOTE — Progress Notes (Signed)
PCP: None  Primary Cardiologist: Dr Antoine Poche HF MD: Dr Shirlee Latch   HPI: Crookshanks is a12 y.o.maleAA male with a history of CAD dating back to 2007 when he had a PCI in [place]. In 2010 he presented with a STEMI and had an RCA DES placed. He followed up for a year but then didn't present again till Oct 2017 when he presented with a CFX infarct. Cath revealed 50% ISR of the RCA, total mCFX and 85% OM3. He had CFX PCI with DES and OM3 POBA. His EF then was 50-55%.  Admitted 02/27/17 with increased dyspnea and chest pain. CTA confirmed PE. ECHO completed and showed reduced EF 20-25%. Underwent LHC as noted below. [place] no evidence DVTs. CT surgery consulted for severe MR. Plan to optimize HF medications.Set up TEE as an outpatient. Discharge weight 222 pounds.   He returns for HF follow up. He was seen earlier this week in the clinic and he was given IV lasix for volume overload. Reds Vest was 46%. Overall feeling fine. Complaining of fatigue but he was very active yesterday. Denies PND/Orthopnea. No bleeding problems. Appetite ok. No fever or chills. Weight at home has been stable.  Taking all medications.   ECHO 03/01/2017  Mildly dilated LV with EF 25-30%, diffuse hypokinesis with   inferolateral akinesis. Restrictive diastolic function. RV poorly   visualized but appears normal in size with mildly decreased   systolic function. D-shaped interventricular septum is suggestive   of RV pressure/volume overload. Moderate pulmonary hypertension.   Moderate to severe mitral regurgitation, suspect infarct-related   MR with restricted posterior leaflet and akinetic inferolateral   wall.  LHC 03/03/2017   Previously placed Prox Cx to Mid Cx stent (unknown type) is widely patent.  Prox Cx lesion is 45% stenosed. - proximal to the Stent -- FFR 0.88.  Prox RCA to Mid RCA stent is 55% stenosed.  Dist LAD lesion is 20% stenosed.  LV end diastolic pressure is severely elevated. 30-31 mmHg     ROS: All  systems negative except as listed in HPI, PMH and Problem List.  SH:  Social History   Socioeconomic History  . Marital status: Married    Spouse name: Not on file  . Number of children: Not on file  . Years of education: Not on file  . Highest education level: Not on file  Social Needs  . Financial resource strain: Not on file  . Food insecurity - worry: Not on file  . Food insecurity - inability: Not on file  . Transportation needs - medical: Not on file  . Transportation needs - non-medical: Not on file  Occupational History  . Not on file  Tobacco Use  . Smoking status: Former Smoker    Packs/day: 0.50    Years: 17.00    Pack years: 8.50    Types: Cigarettes  . Smokeless tobacco: Never Used  . Tobacco comment: quit since heart attack in 10/2015  Substance and Sexual Activity  . Alcohol use: Yes    Alcohol/week: 4.8 oz    Types: 8 Shots of liquor per week  . Drug use: Yes    Types: Marijuana  . Sexual activity: Not on file  Other Topics Concern  . Not on file  Social History Narrative   The patient is married with two children.    FH:  Family History  Problem Relation Age of Onset  . Other Sister 60       THE PATIENT REPORTS A SISTER WITH  A MYOCARDIAL INFARCTION IN HER 30s.(SHE IS NOW S/P CABG)  . Heart attack Father   . Deep vein thrombosis Father     Past Medical History:  Diagnosis Date  . Asthma   . CAD (coronary artery disease)   . Chronic systolic CHF (congestive heart failure) (HCC)   . HLD (hyperlipidemia)    Qualifier: Diagnosis of  By: Antoine Poche, MD, Gerrit Heck    . Hypercholesteremia   . Inferior myocardial infarction [facility]) 2007   PCI of RCA  . Inferior myocardial infarction (HCC) 03/31/2008   PCI of RCA with bare metal stent  . Ischemic cardiomyopathy   . Mitral regurgitation   . Obstructive sleep apnea    Qualifier: Diagnosis of  By: Antoine Poche, MD, Gerrit Heck    . Posterolateral myocardial infarction (HCC) 10/20/2015   PCI of LCx using  DES  . Pulmonary embolism (HCC) 02/28/2017  . Tobacco abuse     Current Outpatient Medications  Medication Sig Dispense Refill  . atorvastatin (LIPITOR) 80 MG tablet Take 1 tablet (80 mg total) by mouth daily. 30 tablet 2  . carvedilol (COREG) 3.125 MG tablet Take 1 tablet (3.125 mg total) by mouth 2 (two) times daily with a meal. 60 tablet 2  . furosemide (LASIX) 80 MG tablet Take 1 tablet (80 mg total) by mouth daily. 30 tablet 2  . losartan (COZAAR) 25 MG tablet Take 1 tablet (25 mg total) by mouth daily. 30 tablet 2  . rivaroxaban (XARELTO) 20 MG TABS tablet Take 1 tablet (20 mg total) by mouth daily with supper. 30 tablet 2  . spironolactone (ALDACTONE) 25 MG tablet Take 1 tablet (25 mg total) by mouth daily. 30 tablet 2  . nitroGLYCERIN (NITROSTAT) 0.4 MG SL tablet Place 1 tablet (0.4 mg total) under the tongue every 5 (five) minutes as needed for chest pain. (Patient not taking: Reported on 03/16/2017) 25 tablet 2   No current facility-administered medications for this encounter.     Vitals:   04/02/17 0849  BP: (!) 74/1  Pulse: 77  SpO2: 98%  Weight: 239 lb 9.6 oz (108.7 kg)   ReDS Vest - 04/02/17 0800      ReDS Vest   MR   Severe    Estimated volume prior to reading  Low    Fitting Posture  Sitting    Height Marker  Tall    Ruler Value  7    Center Strip  Shifted    ReDS Value  38       PHYSICAL EXAM: General:  Well appearing. No resp difficulty HEENT: normal Neck: supple. no JVD. Carotids 2+ bilat; no bruits. No lymphadenopathy or thryomegaly appreciated. Cor: PMI nondisplaced. Regular rate & rhythm. No rubs, gallops or murmurs. Lungs: clear Abdomen: soft, nontender, nondistended. No hepatosplenomegaly. No bruits or masses. Good bowel sounds. Extremities: no cyanosis, clubbing, rash, edema Neuro: alert & orientedx3, cranial nerves grossly intact. moves all 4 extremities w/o difficulty. Affect pleasant.   ASSESSMENT & PLAN: 1. Chronic Systolic Heart Failure,  ICM ECHO 02/2017 25-30%.  NYHA II-III. Reds Vest lower 46>38%. Hold lasix today. Then he will start 40 mg lasix daily. Today he is hypotensive. Hold carvedilol, losartan, spironolactone. Tomorrow he will resume .  Check BMET today.   2. CAD Stents in LCX and RCA. LHC cath 02/2017 nonobstructive disease.  No S/S ischemia.  Continue statin, bb.  On xarelto so no aspirin.  3. PE - CTA 02/2017 with PE note.  ? Etiology. Father has  venous atheroembolism.  + lupus anticoagulant but with xarelto false positive so no need for hematology consult. .  Likely need life long anticoagulation. Continue xarelto.  4. Mitral Regurgitation ? Infarct. Secondary MR.  Volume overloaded will set up TEE after volume status better. Hopefully will set up next time.  . 5. Former Pension scheme managermoker Congratulated on smoking cessation.   Follow up next week. Set up TEE at that time.   Linley Moxley NP-C  9:07 AM

## 2017-04-02 ENCOUNTER — Ambulatory Visit (HOSPITAL_COMMUNITY)
Admission: RE | Admit: 2017-04-02 | Discharge: 2017-04-02 | Disposition: A | Discharge status: Home / Self Care | Payer: Self-pay | Source: Ambulatory Visit | Attending: Internal Medicine | Admitting: Internal Medicine

## 2017-04-02 ENCOUNTER — Encounter (HOSPITAL_COMMUNITY): Payer: Self-pay

## 2017-04-02 VITALS — BP 74/1 | HR 77 | Wt 239.6 lb

## 2017-04-02 DIAGNOSIS — I340 Nonrheumatic mitral (valve) insufficiency: Secondary | ICD-10-CM | POA: Insufficient documentation

## 2017-04-02 DIAGNOSIS — I5022 Chronic systolic (congestive) heart failure: Secondary | ICD-10-CM | POA: Insufficient documentation

## 2017-04-02 DIAGNOSIS — Z832 Family history of diseases of the blood and blood-forming organs and certain disorders involving the immune mechanism: Secondary | ICD-10-CM | POA: Insufficient documentation

## 2017-04-02 DIAGNOSIS — E7800 Pure hypercholesterolemia, unspecified: Secondary | ICD-10-CM | POA: Insufficient documentation

## 2017-04-02 DIAGNOSIS — Z7902 Long term (current) use of antithrombotics/antiplatelets: Secondary | ICD-10-CM | POA: Insufficient documentation

## 2017-04-02 DIAGNOSIS — I2510 Atherosclerotic heart disease of native coronary artery without angina pectoris: Secondary | ICD-10-CM | POA: Insufficient documentation

## 2017-04-02 DIAGNOSIS — Z79899 Other long term (current) drug therapy: Secondary | ICD-10-CM | POA: Insufficient documentation

## 2017-04-02 DIAGNOSIS — Z86711 Personal history of pulmonary embolism: Secondary | ICD-10-CM | POA: Insufficient documentation

## 2017-04-02 DIAGNOSIS — Z87891 Personal history of nicotine dependence: Secondary | ICD-10-CM | POA: Insufficient documentation

## 2017-04-02 DIAGNOSIS — I25810 Atherosclerosis of coronary artery bypass graft(s) without angina pectoris: Secondary | ICD-10-CM

## 2017-04-02 DIAGNOSIS — G4733 Obstructive sleep apnea (adult) (pediatric): Secondary | ICD-10-CM | POA: Insufficient documentation

## 2017-04-02 DIAGNOSIS — I255 Ischemic cardiomyopathy: Secondary | ICD-10-CM | POA: Insufficient documentation

## 2017-04-02 DIAGNOSIS — Z8249 Family history of ischemic heart disease and other diseases of the circulatory system: Secondary | ICD-10-CM | POA: Insufficient documentation

## 2017-04-02 DIAGNOSIS — Z720 Tobacco use: Secondary | ICD-10-CM

## 2017-04-02 DIAGNOSIS — I272 Pulmonary hypertension, unspecified: Secondary | ICD-10-CM | POA: Insufficient documentation

## 2017-04-02 DIAGNOSIS — I252 Old myocardial infarction: Secondary | ICD-10-CM | POA: Insufficient documentation

## 2017-04-02 LAB — CBC
HCT: 40.8 % (ref 39.0–52.0)
Hemoglobin: 13 g/dL (ref 13.0–17.0)
MCH: 28.4 pg (ref 26.0–34.0)
MCHC: 31.9 g/dL (ref 30.0–36.0)
MCV: 89.3 fL (ref 78.0–100.0)
PLATELETS: 246 10*3/uL (ref 150–400)
RBC: 4.57 MIL/uL (ref 4.22–5.81)
RDW: 14.1 % (ref 11.5–15.5)
WBC: 8.6 10*3/uL (ref 4.0–10.5)

## 2017-04-02 LAB — BASIC METABOLIC PANEL
Anion gap: 9 (ref 5–15)
BUN: 17 mg/dL (ref 6–20)
CHLORIDE: 106 mmol/L (ref 101–111)
CO2: 25 mmol/L (ref 22–32)
CREATININE: 0.97 mg/dL (ref 0.61–1.24)
Calcium: 9.4 mg/dL (ref 8.9–10.3)
GFR calc Af Amer: >60 mL/min (ref >60)
GFR calc non Af Amer: >60 mL/min (ref >60)
Glucose, Bld: 99 mg/dL (ref 65–99)
Potassium: 4 mmol/L (ref 3.5–5.1)
SODIUM: 140 mmol/L (ref 135–145)

## 2017-04-02 MED ORDER — FUROSEMIDE 40 MG PO TABS: 40.0000 mg | ORAL_TABLET | Freq: Every day | ORAL | 11 refills | Status: DC

## 2017-04-02 MED ORDER — RIVAROXABAN 20 MG PO TABS: 20.0000 mg | ORAL_TABLET | Freq: Every day | ORAL | 2 refills | Status: DC

## 2017-04-02 NOTE — Patient Instructions (Signed)
HOLD TODAY: Lasix Spironolactone Carvedilol Losartan  Restart all meds tomorrow.  You will restart Lasix at REDUCED dose of 40 mg once daily.  Can cut 80 mg tablets in half (Take 1/2 tab once every morning). New Rx has been sent for 40 mg tablets (Take 1 tab once daily).  Follow up next week with Otilio SaberAndy Tillery PA-C.  ____________________________________________________________ [place] Ridge[place] Code:  Take all medication as prescribed the day of your appointment. Bring all medications with you to your appointment.  Do the following things EVERYDAY: 1) Weigh yourself in the morning before breakfast. Write it down and keep it in a log. 2) Take your medicines as prescribed 3) Eat low salt foods-Limit salt (sodium) to 2000 mg per day.  4) Stay as active as you can everyday 5) Limit all fluids for the day to less than 2 liters

## 2017-04-07 ENCOUNTER — Ambulatory Visit (HOSPITAL_COMMUNITY)
Admission: RE | Admit: 2017-04-07 | Discharge: 2017-04-07 | Disposition: A | Discharge status: Home / Self Care | Payer: Self-pay | Source: Ambulatory Visit | Attending: Cardiology | Admitting: Cardiology

## 2017-04-07 ENCOUNTER — Ambulatory Visit: Payer: Medicaid Other | Admitting: Nurse Practitioner

## 2017-04-07 ENCOUNTER — Encounter (HOSPITAL_COMMUNITY): Payer: Self-pay

## 2017-04-07 ENCOUNTER — Other Ambulatory Visit (HOSPITAL_COMMUNITY): Payer: Self-pay

## 2017-04-07 VITALS — BP 106/66 | HR 85 | Wt 241.0 lb

## 2017-04-07 DIAGNOSIS — I272 Pulmonary hypertension, unspecified: Secondary | ICD-10-CM | POA: Insufficient documentation

## 2017-04-07 DIAGNOSIS — I2510 Atherosclerotic heart disease of native coronary artery without angina pectoris: Secondary | ICD-10-CM | POA: Insufficient documentation

## 2017-04-07 DIAGNOSIS — Z8249 Family history of ischemic heart disease and other diseases of the circulatory system: Secondary | ICD-10-CM | POA: Insufficient documentation

## 2017-04-07 DIAGNOSIS — I5022 Chronic systolic (congestive) heart failure: Secondary | ICD-10-CM | POA: Insufficient documentation

## 2017-04-07 DIAGNOSIS — I2699 Other pulmonary embolism without acute cor pulmonale: Secondary | ICD-10-CM

## 2017-04-07 DIAGNOSIS — Z87891 Personal history of nicotine dependence: Secondary | ICD-10-CM | POA: Insufficient documentation

## 2017-04-07 DIAGNOSIS — R42 Dizziness and giddiness: Secondary | ICD-10-CM | POA: Insufficient documentation

## 2017-04-07 DIAGNOSIS — I252 Old myocardial infarction: Secondary | ICD-10-CM | POA: Insufficient documentation

## 2017-04-07 DIAGNOSIS — Z79899 Other long term (current) drug therapy: Secondary | ICD-10-CM | POA: Insufficient documentation

## 2017-04-07 DIAGNOSIS — I340 Nonrheumatic mitral (valve) insufficiency: Secondary | ICD-10-CM | POA: Insufficient documentation

## 2017-04-07 DIAGNOSIS — Z86711 Personal history of pulmonary embolism: Secondary | ICD-10-CM | POA: Insufficient documentation

## 2017-04-07 DIAGNOSIS — E7800 Pure hypercholesterolemia, unspecified: Secondary | ICD-10-CM | POA: Insufficient documentation

## 2017-04-07 DIAGNOSIS — J45909 Unspecified asthma, uncomplicated: Secondary | ICD-10-CM | POA: Insufficient documentation

## 2017-04-07 DIAGNOSIS — Z7901 Long term (current) use of anticoagulants: Secondary | ICD-10-CM | POA: Insufficient documentation

## 2017-04-07 DIAGNOSIS — R079 Chest pain, unspecified: Secondary | ICD-10-CM | POA: Insufficient documentation

## 2017-04-07 DIAGNOSIS — E785 Hyperlipidemia, unspecified: Secondary | ICD-10-CM

## 2017-04-07 DIAGNOSIS — I255 Ischemic cardiomyopathy: Secondary | ICD-10-CM | POA: Insufficient documentation

## 2017-04-07 DIAGNOSIS — G4733 Obstructive sleep apnea (adult) (pediatric): Secondary | ICD-10-CM | POA: Insufficient documentation

## 2017-04-07 LAB — BASIC METABOLIC PANEL
ANION GAP: 6 (ref 5–15)
BUN: 19 mg/dL (ref 6–20)
CALCIUM: 8.9 mg/dL (ref 8.9–10.3)
CO2: 27 mmol/L (ref 22–32)
Chloride: 107 mmol/L (ref 101–111)
Creatinine, Ser: 0.95 mg/dL (ref 0.61–1.24)
Glucose, Bld: 118 mg/dL — ABNORMAL HIGH (ref 65–99)
POTASSIUM: 3.9 mmol/L (ref 3.5–5.1)
Sodium: 140 mmol/L (ref 135–145)

## 2017-04-07 NOTE — Patient Instructions (Addendum)
Routine lab work today. Will notify you of abnormal results, otherwise no news is good news!  HOLD Lasix tomorrow and Friday.  You have been scheduled for a transesophageal echocardiogram. See instruction sheet for additional details.  Follow up 4 weeks with Otilio SaberAndy Tillery PA-C.  Take all medication as prescribed the day of your appointment. Bring all medications with you to your appointment.  Do the following things EVERYDAY: 1) Weigh yourself in the morning before breakfast. Write it down and keep it in a log. 2) Take your medicines as prescribed 3) Eat low salt foods-Limit salt (sodium) to 2000 mg per day.  4) Stay as active as you can everyday 5) Limit all fluids for the day to less than 2 liters  Transesophageal Echocardiogram Transesophageal echocardiography (TEE) is a picture test of your heart using sound waves. The pictures taken can give very detailed pictures of your heart. This can help your doctor see if there are problems with your heart. TEE can check:  If your heart has blood clots in it.  How well your heart valves are working.  If you have an infection on the inside of your heart.  Some of the major arteries of your heart.  If your heart valve is working after a Psychologist, forensicrepair.  Your heart before a procedure that uses a shock to your heart to get the rhythm back to normal.  What happens before the procedure?  Do not eat or drink for 6 hours before the procedure or as told by your doctor.  Make plans to have someone drive you home after the procedure. Do not drive yourself home.  An IV tube will be put in your arm. What happens during the procedure?  You will be given a medicine to help you relax (sedative). It will be given through the IV tube.  A numbing medicine will be sprayed or gargled in the back of your throat to help numb it.  The tip of the probe is placed into the back of your mouth. You will be asked to swallow. This helps to pass the probe into your  esophagus.  Once the tip of the probe is in the right place, your doctor can take pictures of your heart.  You may feel pressure at the back of your throat. What happens after the procedure?  You will be taken to a recovery area so the sedative can wear off.  Your throat may be sore and scratchy. This will go away slowly over time.  You will go home when you are fully awake and able to swallow liquids.  You should have someone stay with you for the next 24 hours.  Do not drive or operate machinery for the next 24 hours. This information is not intended to replace advice given to you by your health care provider. Make sure you discuss any questions you have with your health care provider. Document Released: 11/02/2008 Document Revised: 06/13/2015 Document Reviewed: 07/07/2012 Elsevier Interactive Patient Education  [employer]2018 [employer]

## 2017-04-07 NOTE — H&P (View-Only) (Signed)
PCP: None  Primary Cardiologist: Dr Hochrein HF MD: Dr McLean   HPI: Joshua May is a 42 y.o. maleAA male with a history of CAD dating back to 2007 when he had a PCI in [place]. In 2010 he presented with a STEMI and had an RCA DES placed. He followed up for a year but then didn't present again till Oct 2017 when he presented with a CFX infarct. Cath revealed 50% ISR of the RCA, total mCFX and 85% OM3. He had CFX PCI with DES and OM3 POBA. His EF then was 50-55%.  Admitted 02/27/17 with increased dyspnea and chest pain. CTA confirmed PE. ECHO completed and showed reduced EF 20-25%. Underwent LHC as noted below. US no evidence DVTs. CT surgery consulted for severe MR. Plan to optimize HF medications.Set up TEE as an outpatient. Discharge weight 222 pounds.   He presents today for follow up. Seen in clinic last week and ReDs vest 38%, after received IV lasix the previous week. He feels about the same. Weight at home 237 lbs. He denies orthopnea or PND. He has had lightheadedness since yesterday. + orthostatics on arrival to clinic. He has been taking all medications as directed. Denies bleeding problems. No fevers or chills.   ECHO 03/01/2017  Mildly dilated LV with EF 25-30%, diffuse hypokinesis with   inferolateral akinesis. Restrictive diastolic function. RV poorly   visualized but appears normal in size with mildly decreased   systolic function. D-shaped interventricular septum is suggestive   of RV pressure/volume overload. Moderate pulmonary hypertension.   Moderate to severe mitral regurgitation, suspect infarct-related   MR with restricted posterior leaflet and akinetic inferolateral   wall.  LHC 03/03/2017   Previously placed Prox Cx to Mid Cx stent (unknown type) is widely patent.  Prox Cx lesion is 45% stenosed. - proximal to the Stent -- FFR 0.88.  Prox RCA to Mid RCA stent is 55% stenosed.  Dist LAD lesion is 20% stenosed.  LV end diastolic pressure is severely elevated. 30-31  mmHg  Review of systems complete and found to be negative unless listed in HPI.    SH:  Social History   Socioeconomic History  . Marital status: Married    Spouse name: Not on file  . Number of children: Not on file  . Years of education: Not on file  . Highest education level: Not on file  Social Needs  . Financial resource strain: Not on file  . Food insecurity - worry: Not on file  . Food insecurity - inability: Not on file  . Transportation needs - medical: Not on file  . Transportation needs - non-medical: Not on file  Occupational History  . Not on file  Tobacco Use  . Smoking status: Former Smoker    Packs/day: 0.50    Years: 17.00    Pack years: 8.50    Types: Cigarettes  . Smokeless tobacco: Never Used  . Tobacco comment: quit since heart attack in 10/2015  Substance and Sexual Activity  . Alcohol use: Yes    Alcohol/week: 4.8 oz    Types: 8 Shots of liquor per week  . Drug use: Yes    Types: Marijuana  . Sexual activity: Not on file  Other Topics Concern  . Not on file  Social History Narrative   The patient is married with two children.    FH:  Family History  Problem Relation Age of Onset  . Other Sister 30         THE PATIENT REPORTS A SISTER WITH A MYOCARDIAL INFARCTION IN HER 30s.(SHE IS NOW S/P CABG)  . Heart attack Father   . Deep vein thrombosis Father     Past Medical History:  Diagnosis Date  . Asthma   . CAD (coronary artery disease)   . Chronic systolic CHF (congestive heart failure) (HCC)   . HLD (hyperlipidemia)    Qualifier: Diagnosis of  By: Hochrein, MD, FACC, James    . Hypercholesteremia   . Inferior myocardial infarction (HCC) 2007   PCI of RCA  . Inferior myocardial infarction (HCC) 03/31/2008   PCI of RCA with bare metal stent  . Ischemic cardiomyopathy   . Mitral regurgitation   . Obstructive sleep apnea    Qualifier: Diagnosis of  By: Hochrein, MD, FACC, James    . Posterolateral myocardial infarction (HCC) 10/20/2015    PCI of LCx using DES  . Pulmonary embolism (HCC) 02/28/2017  . Tobacco abuse     Current Outpatient Medications  Medication Sig Dispense Refill  . atorvastatin (LIPITOR) 80 MG tablet Take 1 tablet (80 mg total) by mouth daily. 30 tablet 2  . carvedilol (COREG) 3.125 MG tablet Take 1 tablet (3.125 mg total) by mouth 2 (two) times daily with a meal. 60 tablet 2  . furosemide (LASIX) 40 MG tablet Take 1 tablet (40 mg total) by mouth daily. 30 tablet 11  . losartan (COZAAR) 25 MG tablet Take 1 tablet (25 mg total) by mouth daily. 30 tablet 2  . nitroGLYCERIN (NITROSTAT) 0.4 MG SL tablet Place 1 tablet (0.4 mg total) under the tongue every 5 (five) minutes as needed for chest pain. 25 tablet 2  . rivaroxaban (XARELTO) 20 MG TABS tablet Take 1 tablet (20 mg total) by mouth at bedtime. 30 tablet 2  . spironolactone (ALDACTONE) 25 MG tablet Take 1 tablet (25 mg total) by mouth daily. 30 tablet 2   No current facility-administered medications for this encounter.     Vitals:   04/07/17 1025  BP: 106/66  Pulse: 85  SpO2: 99%  Weight: 241 lb (109.3 kg)   Orthostatics Sitting 102/60 Standing 82/52   Wt Readings from Last 3 Encounters:  04/07/17 241 lb (109.3 kg)  04/02/17 239 lb 9.6 oz (108.7 kg)  03/30/17 238 lb 3.2 oz (108 kg)    PHYSICAL EXAM: General: Well appearing. No resp difficulty. HEENT: Normal Neck: Supple. JVP flat. Carotids 2+ bilat; no bruits. No thyromegaly or nodule noted. Cor: PMI nondisplaced. RRR, No M/G/R noted Lungs: CTAB, normal effort. Abdomen: Soft, non-tender, non-distended, no HSM. No bruits or masses. +BS  Extremities: No cyanosis, clubbing, or rash. R and LLE no edema.  Neuro: Alert & orientedx3, cranial nerves grossly intact. moves all 4 extremities w/o difficulty. Affect pleasant   ASSESSMENT & PLAN: 1. Chronic Systolic Heart Failure, ICM ECHO 02/2017 25-30%.  - NYHA II-III.  - Volume status dry on exam, with no JVP and + orthostatics. ReDs Vest  38%, but clip on widest setting, so suspect this is near normal for him with his body habitus.  - Hold lasix x 2 days, then resume 40 mg daily. BMET today.  - Continue coreg 3.125 mg BID - Continue spiro 25 mg daily - Continue losartan 25 mg daily.  - Reinforced fluid restriction to < 2 L daily, sodium restriction to less than 2000 mg daily, and the importance of daily weights.   2. CAD - Stents in LCX and RCA. LHC cath 02/2017 nonobstructive disease.  -   No s/s of ischemia.    - Continue statin, bb.  - On xarelto so no aspirin.  3. PE - CTA 02/2017 with PE note.  - ? Etiology. Father has venous atheroembolism.  - + lupus anticoagulant but with xarelto false positive so no need for hematology consult. .  - Likely need life long anticoagulation. Continue xarelto.  4. Mitral Regurgitation - ? Infarct. Secondary MR.  - Plan for TEE next week.  5. Former Smoker - Encouraged continued smoking sensation.   RTC 4 weeks. Plan TEE in next 1-2 weeks.   Michael Andrew Tillery, PA-C  10:38 AM   Greater than 50% of the 25 minute visit was spent in counseling/coordination of care regarding disease state education, salt/fluid restriction, sliding scale diuretics, and medication compliance.   

## 2017-04-07 NOTE — Progress Notes (Signed)
PCP: None  Primary Cardiologist: Dr Antoine PocheHochrein HF MD: Dr Shirlee LatchMcLean   HPI: Joshua May is a 42 y.o. Joshua GuessmaleAA male with a history of CAD dating back to 2007 when he had a [place] in [place]. In 2010 he presented with a STEMI and had an RCA DES placed. He followed up for a year but then didn't present again till Oct 2017 when he presented with a CFX infarct. Cath revealed 50% ISR of the RCA, total mCFX and 85% OM3. He had CFX PCI with DES and OM3 POBA. His EF then was 50-55%.  Admitted 02/27/17 with increased dyspnea and chest pain. CTA confirmed PE. ECHO completed and showed reduced EF 20-25%. Underwent LHC as noted below. US no evidence DVTs. CT surgery consulted for severe MR. Plan to optimize HF medications.Set up TEE as an outpatient. Discharge weight 222 pounds.   He presents today for follow up. Seen in clinic last week and ReDs vest 38%, after received IV lasix the previous week. He feels about the same. Weight at home 237 lbs. He denies orthopnea or PND. He has had lightheadedness since yesterday. + orthostatics on arrival to clinic. He has been taking all medications as directed. Denies bleeding problems. No fevers or chills.   ECHO 03/01/2017  Mildly dilated LV with EF 25-30%, diffuse hypokinesis with   inferolateral akinesis. Restrictive diastolic function. RV poorly   visualized but appears normal in size with mildly decreased   systolic function. D-shaped interventricular septum is suggestive   of RV pressure/volume overload. Moderate pulmonary hypertension.   Moderate to severe mitral regurgitation, suspect infarct-related   MR with restricted posterior leaflet and akinetic inferolateral   wall.  LHC 03/03/2017   Previously placed Prox Cx to Mid Cx stent (unknown type) is widely patent.  Prox Cx lesion is 45% stenosed. - proximal to the Stent -- FFR 0.88.  Prox RCA to Mid RCA stent is 55% stenosed.  Dist LAD lesion is 20% stenosed.  LV end diastolic pressure is severely elevated. 30-31  mmHg  Review of systems complete and found to be negative unless listed in HPI.    SH:  Social History   Socioeconomic History  . Marital status: Married    Spouse name: Not on file  . Number of children: Not on file  . Years of education: Not on file  . Highest education level: Not on file  Social Needs  . Financial resource strain: Not on file  . Food insecurity - worry: Not on file  . Food insecurity - inability: Not on file  . Transportation needs - medical: Not on file  . Transportation needs - non-medical: Not on file  Occupational History  . Not on file  Tobacco Use  . Smoking status: Former Smoker    Packs/day: 0.50    Years: 17.00    Pack years: 8.50    Types: Cigarettes  . Smokeless tobacco: Never Used  . Tobacco comment: quit since heart attack in 10/2015  Substance and Sexual Activity  . Alcohol use: Yes    Alcohol/week: 4.8 oz    Types: 8 Shots of liquor per week  . Drug use: Yes    Types: Marijuana  . Sexual activity: Not on file  Other Topics Concern  . Not on file  Social History Narrative   The patient is married with two children.    FH:  Family History  Problem Relation Age of Onset  . Other Sister 2430  THE PATIENT REPORTS A SISTER WITH A MYOCARDIAL INFARCTION IN HER 30s.(SHE IS NOW S/P CABG)  . Heart attack Father   . Deep vein thrombosis Father     Past Medical History:  Diagnosis Date  . Asthma   . CAD (coronary artery disease)   . Chronic systolic CHF (congestive heart failure) (HCC)   . HLD (hyperlipidemia)    Qualifier: Diagnosis of  By: Antoine Poche, MD, Gerrit Heck    . Hypercholesteremia   . Inferior myocardial infarction [facility]) 2007   PCI of RCA  . Inferior myocardial infarction (HCC) 03/31/2008   PCI of RCA with bare metal stent  . Ischemic cardiomyopathy   . Mitral regurgitation   . Obstructive sleep apnea    Qualifier: Diagnosis of  By: Antoine Poche, MD, Gerrit Heck    . Posterolateral myocardial infarction (HCC) 10/20/2015    PCI of LCx using DES  . Pulmonary embolism (HCC) 02/28/2017  . Tobacco abuse     Current Outpatient Medications  Medication Sig Dispense Refill  . atorvastatin (LIPITOR) 80 MG tablet Take 1 tablet (80 mg total) by mouth daily. 30 tablet 2  . carvedilol (COREG) 3.125 MG tablet Take 1 tablet (3.125 mg total) by mouth 2 (two) times daily with a meal. 60 tablet 2  . furosemide (LASIX) 40 MG tablet Take 1 tablet (40 mg total) by mouth daily. 30 tablet 11  . losartan (COZAAR) 25 MG tablet Take 1 tablet (25 mg total) by mouth daily. 30 tablet 2  . nitroGLYCERIN (NITROSTAT) 0.4 MG SL tablet Place 1 tablet (0.4 mg total) under the tongue every 5 (five) minutes as needed for chest pain. 25 tablet 2  . rivaroxaban (XARELTO) 20 MG TABS tablet Take 1 tablet (20 mg total) by mouth at bedtime. 30 tablet 2  . spironolactone (ALDACTONE) 25 MG tablet Take 1 tablet (25 mg total) by mouth daily. 30 tablet 2   No current facility-administered medications for this encounter.     Vitals:   04/07/17 1025  BP: 106/66  Pulse: 85  SpO2: 99%  Weight: 241 lb (109.3 kg)   Orthostatics Sitting 102/60 Standing 82/52   Wt Readings from Last 3 Encounters:  04/07/17 241 lb (109.3 kg)  04/02/17 239 lb 9.6 oz (108.7 kg)  03/30/17 238 lb 3.2 oz (108 kg)    PHYSICAL EXAM: General: Well appearing. No resp difficulty. HEENT: Normal Neck: Supple. JVP flat. Carotids 2+ bilat; no bruits. No thyromegaly or nodule noted. Cor: PMI nondisplaced. RRR, No M/G/R noted Lungs: CTAB, normal effort. Abdomen: Soft, non-tender, non-distended, no HSM. No bruits or masses. +BS  Extremities: No cyanosis, clubbing, or rash. R and LLE no edema.  Neuro: Alert & orientedx3, cranial nerves grossly intact. moves all 4 extremities w/o difficulty. Affect pleasant   ASSESSMENT & PLAN: 1. Chronic Systolic Heart Failure, ICM ECHO 02/2017 25-30%.  - NYHA II-III.  - Volume status dry on exam, with no JVP and + orthostatics. ReDs Vest  38%, but clip on widest setting, so suspect this is near normal for him with his body habitus.  - Hold lasix x 2 days, then resume 40 mg daily. BMET today.  - Continue coreg 3.125 mg BID - Continue spiro 25 mg daily - Continue losartan 25 mg daily.  - Reinforced fluid restriction to < 2 L daily, sodium restriction to less than 2000 mg daily, and the importance of daily weights.   2. CAD - Stents in LCX and RCA. LHC cath 02/2017 nonobstructive disease.  -  No s/s of ischemia.    - Continue statin, bb.  - On xarelto so no aspirin.  3. PE - CTA 02/2017 with PE note.  - ? Etiology. Father has venous atheroembolism.  - + lupus anticoagulant but with xarelto false positive so no need for hematology consult. .  - Likely need life long anticoagulation. Continue xarelto.  4. Mitral Regurgitation - ? Infarct. Secondary MR.  - Plan for TEE next week.  5. Former Smoker - Encouraged continued smoking sensation.   RTC 4 weeks. Plan TEE in next 1-2 weeks.   Graciella Freer, PA-C  10:38 AM   Greater than 50% of the 25 minute visit was spent in counseling/coordination of care regarding disease state education, salt/fluid restriction, sliding scale diuretics, and medication compliance.

## 2017-04-19 ENCOUNTER — Encounter (HOSPITAL_COMMUNITY)
Admission: RE | Disposition: A | Discharge status: Home / Self Care | Payer: Self-pay | Source: Ambulatory Visit | Attending: Cardiology

## 2017-04-19 ENCOUNTER — Telehealth (HOSPITAL_COMMUNITY): Payer: Self-pay | Admitting: *Deleted

## 2017-04-19 ENCOUNTER — Ambulatory Visit (HOSPITAL_COMMUNITY)
Admission: RE | Admit: 2017-04-19 | Discharge: 2017-04-19 | Disposition: A | Discharge status: Home / Self Care | Payer: Medicaid Other | Source: Ambulatory Visit | Attending: Cardiology | Admitting: Cardiology

## 2017-04-19 ENCOUNTER — Encounter (HOSPITAL_COMMUNITY): Payer: Self-pay | Admitting: *Deleted

## 2017-04-19 ENCOUNTER — Ambulatory Visit (HOSPITAL_BASED_OUTPATIENT_CLINIC_OR_DEPARTMENT_OTHER)
Admission: RE | Admit: 2017-04-19 | Discharge: 2017-04-19 | Disposition: A | Discharge status: Home / Self Care | Payer: Medicaid Other | Source: Ambulatory Visit | Attending: Cardiology | Admitting: Cardiology

## 2017-04-19 DIAGNOSIS — Z79899 Other long term (current) drug therapy: Secondary | ICD-10-CM | POA: Insufficient documentation

## 2017-04-19 DIAGNOSIS — R0600 Dyspnea, unspecified: Secondary | ICD-10-CM | POA: Insufficient documentation

## 2017-04-19 DIAGNOSIS — I110 Hypertensive heart disease with heart failure: Secondary | ICD-10-CM | POA: Diagnosis not present

## 2017-04-19 DIAGNOSIS — I255 Ischemic cardiomyopathy: Secondary | ICD-10-CM | POA: Insufficient documentation

## 2017-04-19 DIAGNOSIS — E785 Hyperlipidemia, unspecified: Secondary | ICD-10-CM | POA: Insufficient documentation

## 2017-04-19 DIAGNOSIS — Z87891 Personal history of nicotine dependence: Secondary | ICD-10-CM | POA: Insufficient documentation

## 2017-04-19 DIAGNOSIS — I252 Old myocardial infarction: Secondary | ICD-10-CM | POA: Insufficient documentation

## 2017-04-19 DIAGNOSIS — I5022 Chronic systolic (congestive) heart failure: Secondary | ICD-10-CM | POA: Diagnosis not present

## 2017-04-19 DIAGNOSIS — I340 Nonrheumatic mitral (valve) insufficiency: Secondary | ICD-10-CM

## 2017-04-19 DIAGNOSIS — E7800 Pure hypercholesterolemia, unspecified: Secondary | ICD-10-CM | POA: Diagnosis not present

## 2017-04-19 DIAGNOSIS — Z86711 Personal history of pulmonary embolism: Secondary | ICD-10-CM | POA: Insufficient documentation

## 2017-04-19 DIAGNOSIS — Z955 Presence of coronary angioplasty implant and graft: Secondary | ICD-10-CM | POA: Diagnosis not present

## 2017-04-19 DIAGNOSIS — I2510 Atherosclerotic heart disease of native coronary artery without angina pectoris: Secondary | ICD-10-CM | POA: Insufficient documentation

## 2017-04-19 DIAGNOSIS — I272 Pulmonary hypertension, unspecified: Secondary | ICD-10-CM | POA: Insufficient documentation

## 2017-04-19 DIAGNOSIS — G4733 Obstructive sleep apnea (adult) (pediatric): Secondary | ICD-10-CM | POA: Diagnosis not present

## 2017-04-19 HISTORY — PX: TEE WITHOUT CARDIOVERSION: SHX5443

## 2017-04-19 SURGERY — ECHOCARDIOGRAM, TRANSESOPHAGEAL
Anesthesia: Moderate Sedation

## 2017-04-19 MED ORDER — MIDAZOLAM HCL 10 MG/2ML IJ SOLN
INTRAMUSCULAR | Status: DC | PRN
  Administered 2017-04-19 (×2): 2 mg via INTRAVENOUS

## 2017-04-19 MED ORDER — DIPHENHYDRAMINE HCL 50 MG/ML IJ SOLN
INTRAMUSCULAR | Status: AC
  Filled 2017-04-19: qty 1

## 2017-04-19 MED ORDER — MIDAZOLAM HCL 5 MG/ML IJ SOLN
INTRAMUSCULAR | Status: AC
  Filled 2017-04-19: qty 2

## 2017-04-19 MED ORDER — FENTANYL CITRATE (PF) 100 MCG/2ML IJ SOLN
INTRAMUSCULAR | Status: DC | PRN
  Administered 2017-04-19 (×2): 25 ug via INTRAVENOUS

## 2017-04-19 MED ORDER — FENTANYL CITRATE (PF) 100 MCG/2ML IJ SOLN
INTRAMUSCULAR | Status: AC
  Filled 2017-04-19: qty 2

## 2017-04-19 MED ORDER — SODIUM CHLORIDE 0.9 % IV SOLN: INTRAVENOUS | Status: DC

## 2017-04-19 MED ORDER — BUTAMBEN-TETRACAINE-BENZOCAINE 2-2-14 % EX AERO
INHALATION_SPRAY | CUTANEOUS | Status: DC | PRN
  Administered 2017-04-19: 2 via TOPICAL

## 2017-04-19 NOTE — Telephone Encounter (Signed)
Per Dr Shirlee LatchMcLean, he did pt's TEE today and pt needs referral to Dr Cornelius Moraswen for MV repair and f/u in our clinic.  Referral placed, pt is already sch to f/u w/us on 4/17

## 2017-04-19 NOTE — Discharge Instructions (Signed)
TEE ° °YOU HAD AN CARDIAC PROCEDURE TODAY: Refer to the procedure report and other information in the discharge instructions given to you for any specific questions about what was found during the examination. If this information does not answer your questions, please call [facility] office at [phone] to clarify.  ° °DIET: Your first meal following the procedure should be a light meal and then it is ok to progress to your normal diet. A half-sandwich or bowl of soup is an example of a good first meal. Heavy or fried foods are harder to digest and may make you feel nauseous or bloated. Drink plenty of fluids but you should avoid alcoholic beverages for 24 hours. If you had a esophageal dilation, please see attached instructions for diet.  ° °ACTIVITY: Your care partner should take you home directly after the procedure. You should plan to take it easy, moving slowly for the rest of the day. You can resume normal activity the day after the procedure however YOU SHOULD NOT DRIVE, use power tools, machinery or perform tasks that involve climbing or major physical exertion for 24 hours (because of the sedation medicines used during the test).  ° °SYMPTOMS TO REPORT IMMEDIATELY: °A cardiologist can be reached at any hour. Please call [phone] for any of the following symptoms:  °Vomiting of blood or coffee ground material  °New, significant abdominal pain  °New, significant chest pain or pain under the shoulder blades  °Painful or persistently difficult swallowing  °New shortness of breath  °Black, tarry-looking or red, bloody stools ° °FOLLOW UP:  °Please also call with any specific questions about appointments or follow up tests. ° ° °Moderate Conscious Sedation, Adult, Care After °These instructions provide you with information about caring for yourself after your procedure. Your health care provider may also give you more specific instructions. Your treatment has been planned according to current medical  practices, but problems sometimes occur. Call your health care provider if you have any problems or questions after your procedure. °What can I expect after the procedure? °After your procedure, it is common: °· To feel sleepy for several hours. °· To feel clumsy and have poor balance for several hours. °· To have poor judgment for several hours. °· To vomit if you eat too soon. ° °Follow these instructions at home: °For at least 24 hours after the procedure: ° °· Do not: °? Participate in activities where you could fall or become injured. °? Drive. °? Use heavy machinery. °? Drink alcohol. °? Take sleeping pills or medicines that cause drowsiness. °? Make important decisions or sign legal documents. °? Take care of children on your own. °· Rest. °Eating and drinking °· Follow the diet recommended by your health care provider. °· If you vomit: °? Drink water, juice, or soup when you can drink without vomiting. °? Make sure you have little or no nausea before eating solid foods. °General instructions °· Have a responsible adult stay with you until you are awake and alert. °· Take over-the-counter and prescription medicines only as told by your health care provider. °· If you smoke, do not smoke without supervision. °· Keep all follow-up visits as told by your health care provider. This is important. °Contact a health care provider if: °· You keep feeling nauseous or you keep vomiting. °· You feel light-headed. °· You develop a rash. °· You have a fever. °Get help right away if: °· You have trouble breathing. °This information is not intended to replace advice   given to you by your health care provider. Make sure you discuss any questions you have with your health care provider. °Document Released: 10/26/2012 Document Revised: 06/10/2015 Document Reviewed: 04/27/2015 °Elsevier Interactive Patient Education © 2018 [employer] ° °

## 2017-04-19 NOTE — CV Procedure (Signed)
Procedure: TEE  Sedation: Versed 4 mg IV, Fentanyl 50 mcg IV  Findings:  Please see echo section for full report.  Mildly dilated left ventricle with EF 35-40%.  Basal to mid inferior and inferolateral akinesis.  Normal right ventricular size and systolic function.  Mild left atrial enlargement, no LA appendage thrombus.  Normal right atrium.  There was a small PFO by color doppler but bubble study was negative. Trileaflet aortic valve with no significant stenosis or regurgitation.  Trivial TR, peak RV-RA gradient 22 mmHg.  Trivial pulmonic insufficiency.  There was severe mitral regurgitation with PISA ERO 0.43 cm^2.  Suspect infarct-related MR with tethering of the posterior leaflet.  There was flattening but not frank reversal of the pulmonary vein doppler pattern.  Normal caliber thoracic aorta with minimal plaque.   Impression: Severe infarct-related MR.  EF better than it was on last echo.  I will review with cardiac surgeon. As he still is symptomatic with exertional dyspnea, think he may benefit from MV repair.   Marca AnconaDalton Colbie Danner 04/19/2017 9:21 AM

## 2017-04-19 NOTE — Interval H&P Note (Signed)
History and Physical Interval Note:  04/19/2017 8:58 AM  Joshua May  has presented today for surgery, with the diagnosis of MITRAL REGURGE  The various methods of treatment have been discussed with the patient and family. After consideration of risks, benefits and other options for treatment, the patient has consented to  Procedure(s): TRANSESOPHAGEAL ECHOCARDIOGRAM (TEE) (N/A) as a surgical intervention .  The patient's history has been reviewed, patient examined, no change in status, stable for surgery.  I have reviewed the patient's chart and labs.  Questions were answered to the patient's satisfaction.     [name]

## 2017-04-23 ENCOUNTER — Ambulatory Visit: Payer: Medicaid Other | Admitting: Nurse Practitioner

## 2017-04-28 ENCOUNTER — Encounter: Payer: Self-pay | Admitting: Thoracic Surgery (Cardiothoracic Vascular Surgery)

## 2017-04-28 ENCOUNTER — Ambulatory Visit (INDEPENDENT_AMBULATORY_CARE_PROVIDER_SITE_OTHER): Payer: Medicaid Other | Admitting: Thoracic Surgery (Cardiothoracic Vascular Surgery)

## 2017-04-28 ENCOUNTER — Other Ambulatory Visit: Payer: Self-pay | Admitting: *Deleted

## 2017-04-28 VITALS — BP 97/65 | HR 90 | Resp 20 | Ht 71.0 in | Wt 249.0 lb

## 2017-04-28 DIAGNOSIS — I340 Nonrheumatic mitral (valve) insufficiency: Secondary | ICD-10-CM

## 2017-04-28 DIAGNOSIS — Z01818 Encounter for other preprocedural examination: Secondary | ICD-10-CM

## 2017-04-28 NOTE — Progress Notes (Signed)
[address]       [place] [address]             [phone]     CARDIOTHORACIC SURGERY OFFICE NOTE  Referring Provider is Laurey Morale, MD Primary Cardiologist is Rollene Rotunda, MD PCP is Patient, No Pcp Per  HPI:  Patient is a 42 year old African-American male with history of premature coronary artery disease status post multiple previous myocardial infarctions treated with PCI and stenting, ischemic cardiomyopathy, chronic systolic congestive heart failure, hyperlipidemia, obstructive sleep apnea, lupus anticoagulant positive with h/o PE and long-standing tobacco abuse who returns to the office today for follow-up of severe secondary mitral regurgitation.  The patient was hospitalized in February of this year with acute exacerbation of chronic systolic congestive heart failure.  He was also diagnosed with small pulmonary embolus in the right lower lobe on CT angiogram, although imaging suggested that the pulmonary embolus was probably old.  I had the opportunity to see the patient in consultation at that time (full consultation note dated March 04, 2017).  His heart failure medications were optimized and since then the patient has been followed carefully in the advanced heart failure clinic.  He was seen in follow-up recently by Dr. Shirlee Latch.  He is taking all medications as directed and clinically remained stable, although he still reports significant exertional shortness of breath.  He subsequently underwent TEE on April 19, 2017.  TEE revealed mildly dilated left ventricle with ejection fraction estimated 35-40% in the setting of severe mitral regurgitation.  There was inferolateral akinesis.  There was severe mitral regurgitation with ER O measured 0.43 cm.  Functional anatomy appeared consistent with classical type IIIb systolic restriction caused by infarct related mitral regurgitation with tethering of the posterior leaflet.  There was no obvious reversal of  flow in the pulmonary veins but there was severe systolic flattening.  There was a small patent foramen ovale appreciated.  The patient was referred for surgical follow-up.  The patient is married and lives locally in [place] with his wife and 2 teenage children.  He is currently out of work, having previously worked in the [employer] for [employer].  He states that he has been limited by exertional shortness of breath dating back to October 2017.  Symptoms have become considerably worse over the last few months, culminating his admission last February.  Since hospital discharge he has been clinically stable but he still gets short of breath with moderate activity of any kind.  He continues to have a productive cough with blood-tinged sputum that is worse in the mornings.   He reports some tightness in his chest with exertion but he states this is quite mild and not like his previous heart attacks.  He has had some wheezing that he has attributed to asthma.    Current Outpatient Medications  Medication Sig Dispense Refill  . atorvastatin (LIPITOR) 80 MG tablet Take 1 tablet (80 mg total) by mouth daily. 30 tablet 2  . carvedilol (COREG) 3.125 MG tablet Take 1 tablet (3.125 mg total) by mouth 2 (two) times daily with a meal. 60 tablet 2  . furosemide (LASIX) 40 MG tablet Take 1 tablet (40 mg total) by mouth daily. 30 tablet 11  . losartan (COZAAR) 25 MG tablet Take 1 tablet (25 mg total) by mouth daily. 30 tablet 2  . nitroGLYCERIN (NITROSTAT) 0.4 MG SL tablet Place 1 tablet (0.4 mg total) under the tongue every 5 (  five) minutes as needed for chest pain. 25 tablet 2  . rivaroxaban (XARELTO) 20 MG TABS tablet Take 1 tablet (20 mg total) by mouth at bedtime. 30 tablet 2  . spironolactone (ALDACTONE) 25 MG tablet Take 1 tablet (25 mg total) by mouth daily. 30 tablet 2   No current facility-administered medications for this visit.       Physical Exam:   BP 97/65   Pulse 90    Resp 20   Ht 5\' 11"  (1.803 m)   Wt 249 lb (112.9 kg)   SpO2 97% Comment: RA  BMI 34.73 kg/m   General:  Well appearing  Chest:   clear  CV:   RRR w/out murmur  Incisions:  n/a  Abdomen:  soft  Extremities:  Warm, no edema  Diagnostic Tests:  Transesophageal Echocardiography  Patient:    May, Joshua MR #:       [id number] Study Date: 04/19/2017 Gender:     M Age:        41 Height:     180.3 cm Weight:     109.3 kg BSA:        2.37 m^2 Pt. Status: Room:   ADMITTING    Marca Ancona, M.D.  ATTENDING    Marca Ancona, M.D.  ORDERING     Marca Ancona, M.D.  PERFORMING   Marca Ancona, M.D.  REFERRING    Marca Ancona, M.D.  SONOGRAPHER  Thurman Coyer  cc:  -------------------------------------------------------------------  ------------------------------------------------------------------- Indications:      Mitral regurgitation 424.0.  ------------------------------------------------------------------- History:   PMH:   Coronary artery disease.  Congestive heart failure.  PMH:   Myocardial infarction.  Pulmonary embolus.  Risk factors:  Current tobacco use. Dyslipidemia.  ------------------------------------------------------------------- Study Conclusions  - Left ventricle: Mildly dilated left ventricle with EF 35-40%.   Basal to mid inferior and inferolateral akinesis. - Aortic valve: There was no stenosis. - Aorta: Normal caliber thoracic aorta with minimal plaque. - Mitral valve: There was severe mitral regurgitation with PISA ERO   0.43 cm^2. Suspect infarct-related MR with tethering of the   posterior leaflet. There was flattening but not frank reversal of   the pulmonary vein doppler pattern. - Left atrium: The atrium was mildly dilated. No evidence of   thrombus in the atrial cavity or appendage. - Right ventricle: The cavity size was normal. Systolic function   was normal. - Right atrium: No evidence of thrombus in the atrial cavity  or   appendage. - Atrial septum: There was a small PFO by color doppler but bubble   study was negative. - Tricuspid valve: Peak RV-RA gradient (S): 22 mm Hg.  Impressions:  - Severe infarct-related MR. EF better than it was on last echo. I   will review with cardiac surgeon. As he still is symptomatic with   exertional dyspnea, think he may benefit from MV repair.  ------------------------------------------------------------------- Study data:   Study status:  Routine.  Consent:  The risks, benefits, and alternatives to the procedure were explained to the patient and informed consent was obtained.  Procedure:  The patient reported no pain pre or post test. Initial setup. The patient was brought to the laboratory. Surface ECG leads were monitored. Sedation. Conscious sedation was administered by cardiology staff. Transesophageal echocardiography. Topical anesthesia was obtained using viscous lidocaine. A transesophageal probe was inserted by the attending cardiologist. 3D image quality was good. Intravenous contrast (agitated saline) was administered.  Study completion: The patient tolerated the procedure well. There  were no complications.  Administered medications:   Fentanyl, 50mcg. Midazolam, 4mg .          Diagnostic transesophageal echocardiography.  2D and color Doppler.  Birthdate:  Patient birthdate: July 26, 1975.  Age:  Patient is 42 yr old.  Sex:  Gender: male.    BMI: 33.6 kg/m^2.  Blood pressure:     109/50  Patient status:  Outpatient.  Study date:  Study date: 04/19/2017. Study time: 10:00 AM.  Location:  Endoscopy.  -------------------------------------------------------------------  ------------------------------------------------------------------- Left ventricle:  Mildly dilated left ventricle with EF 35-40%. Basal to mid inferior and inferolateral akinesis.  Wall thickness was  normal.  ------------------------------------------------------------------- Aortic valve:   Trileaflet.  Doppler:   There was no stenosis. There was no regurgitation.  ------------------------------------------------------------------- Aorta:  Normal caliber thoracic aorta with minimal plaque.  ------------------------------------------------------------------- Mitral valve:  There was severe mitral regurgitation with PISA ERO 0.43 cm^2. Suspect infarct-related MR with tethering of the posterior leaflet. There was flattening but not frank reversal of the pulmonary vein doppler pattern.  Doppler:   There was no evidence for stenosis.  ------------------------------------------------------------------- Left atrium:  The atrium was mildly dilated.  No evidence of thrombus in the atrial cavity or appendage.  ------------------------------------------------------------------- Atrial septum:  There was a small PFO by color doppler but bubble study was negative.  ------------------------------------------------------------------- Right ventricle:  The cavity size was normal. Systolic function was normal.  ------------------------------------------------------------------- Pulmonic valve:    Doppler:  There was trivial regurgitation.  ------------------------------------------------------------------- Tricuspid valve:   Doppler:  There was trivial regurgitation.   ------------------------------------------------------------------- Right atrium:  The atrium was normal in size.  No evidence of thrombus in the atrial cavity or appendage.  ------------------------------------------------------------------- Pericardium:  There was no pericardial effusion.   ------------------------------------------------------------------- Post procedure conclusions Ascending Aorta:  - Normal caliber thoracic aorta with minimal  plaque.  ------------------------------------------------------------------- Measurements   Mitral valve                         Value  Mitral regurg VTI, PISA              179   cm  Mitral ERO, PISA                     0.43  cm^2  Mitral regurg volume, PISA           77    ml    Tricuspid valve                      Value  Tricuspid regurg peak velocity       233   cm/s  Tricuspid peak RV-RA gradient        22    mm Hg  Legend: (L)  and  (H)  mark values outside specified reference range.  ------------------------------------------------------------------- Prepared and Electronically Authenticated by  Marca Anconaalton McLean, M.D. 2019-04-02T08:40:11   Impression:  Patient has long-standing history of premature coronary artery disease with multiple previous myocardial infarctions and ischemic cardiomyopathy with chronic systolic congestive heart failure.  He has stage D severe symptomatic secondary mitral regurgitation with significant persistent symptoms of exertional shortness of breath despite optimal medical therapy.  I have personally reviewed the patient's recent transesophageal echocardiogram which reveals classical type IIIb mitral valve dysfunction.  I agree the patient might benefit from mitral valve repair.  Patient might be a reasonable candidate for minimally invasive approach for surgery, although it remains unclear to me whether  or not the right coronary artery stenosis identified at the time of catheterization last February his flow-limiting.   Plan:  The patient and his wife were counseled at length regarding the indications, risks and potential benefits of mitral valve repair.  The rationale for elective surgery has been explained, including a comparison between surgery and continued medical therapy with close follow-up.  Alternative surgical approaches have been discussed including a comparison between conventional sternotomy and minimally-invasive techniques.  The  patient hopes to avoid conventional median sternotomy if possible.  Under the circumstances we would not plan elective surgical revascularization.  I think this is reasonable, although it remains possible that the patient's right coronary artery disease may become clinically significant at some point in the not too distant future.  Risks associated with surgery will be acceptably low although somewhat elevated due to the severity of the patient's underlying left ventricular systolic dysfunction as well as his history of previous pulmonary embolus and long-term anticoagulation.  For family reasons the patient does not wish to proceed with surgery until at least the middle of June.  We have not recommended any changes to his current medications.  He will be referred for routine dental service consultation because he has not seen a dentist in many years.  We will obtain CT angiogram of the aorta and iliac vessels to evaluate the feasibility of peripheral cannulation for surgery.  The patient will return to our office for follow-up on June 21, 2017.  We will tentatively plan to make final arrangements for surgery at that time.  I spent in excess of 45 minutes during the conduct of this office consultation and >50% of this time involved direct face-to-face encounter with the patient for counseling and/or coordination of their care.    Salvatore Decent. Cornelius Moras, MD 04/28/2017 12:53 PM

## 2017-04-28 NOTE — Patient Instructions (Signed)
Continue all previous medications without any changes at this time  

## 2017-05-04 ENCOUNTER — Ambulatory Visit (HOSPITAL_COMMUNITY): Payer: Self-pay | Admitting: Dentistry

## 2017-05-04 ENCOUNTER — Encounter (HOSPITAL_COMMUNITY): Payer: Self-pay | Admitting: Dentistry

## 2017-05-04 VITALS — BP 118/69 | HR 83 | Temp 98.2°F

## 2017-05-04 DIAGNOSIS — K06020 Generalized gingival recession, unspecified: Secondary | ICD-10-CM

## 2017-05-04 DIAGNOSIS — K036 Deposits [accretions] on teeth: Secondary | ICD-10-CM

## 2017-05-04 DIAGNOSIS — M264 Malocclusion, unspecified: Secondary | ICD-10-CM

## 2017-05-04 DIAGNOSIS — K0889 Other specified disorders of teeth and supporting structures: Secondary | ICD-10-CM

## 2017-05-04 DIAGNOSIS — M270 Developmental disorders of jaws: Secondary | ICD-10-CM

## 2017-05-04 DIAGNOSIS — Z01818 Encounter for other preprocedural examination: Secondary | ICD-10-CM

## 2017-05-04 DIAGNOSIS — K045 Chronic apical periodontitis: Secondary | ICD-10-CM

## 2017-05-04 DIAGNOSIS — K08409 Partial loss of teeth, unspecified cause, unspecified class: Secondary | ICD-10-CM

## 2017-05-04 DIAGNOSIS — K06010 Localized gingival recession, unspecified: Secondary | ICD-10-CM

## 2017-05-04 DIAGNOSIS — K029 Dental caries, unspecified: Secondary | ICD-10-CM | POA: Insufficient documentation

## 2017-05-04 DIAGNOSIS — Z9189 Other specified personal risk factors, not elsewhere classified: Secondary | ICD-10-CM

## 2017-05-04 DIAGNOSIS — K0530 Chronic periodontitis, unspecified: Secondary | ICD-10-CM

## 2017-05-04 DIAGNOSIS — K083 Retained dental root: Secondary | ICD-10-CM

## 2017-05-04 DIAGNOSIS — I340 Nonrheumatic mitral (valve) insufficiency: Secondary | ICD-10-CM

## 2017-05-04 DIAGNOSIS — K030 Excessive attrition of teeth: Secondary | ICD-10-CM

## 2017-05-04 NOTE — Progress Notes (Signed)
PCP: None  Primary Cardiologist: Dr Antoine Poche HF MD: Dr Shirlee Latch   HPI: Joshua May is a 42 y.o. [place] May male with a history of CAD dating back to 2007 when he had a PCI in [place]. In 2010 he presented with a STEMI and had an RCA DES placed. He followed up for a year but then didn't present again till Oct 2017 when he presented with a CFX infarct. Cath revealed 50% ISR of the RCA, total mCFX and 85% OM3. He had CFX PCI with DES and OM3 POBA. His EF then was 50-55%.  Admitted 02/27/17 with increased dyspnea and chest pain. CTA confirmed PE. ECHO completed and showed reduced EF 20-25%. Underwent LHC as noted below. [place] no evidence DVTs. CT surgery consulted for severe MR. Plan to optimize HF medications.Set up TEE as an outpatient. Discharge weight 222 pounds.   He presents today for regular follow up. Last visit was orthostatic so lasix adjusted down. BP low on arrival today. He denies any further lightheadedness or dizziness since lasix cut back.  Weight up 11 lbs by our scales. Stable around 245 at home.  He denies CP or SOB. No orthopnea. He has been taking all medications as directed. He denies fevers or chills. No near syncope. His main complaint is fatigue. He is getting teeth extracted next week for Mitral Valve surgery later this year.   TEE 04/19/17 LVEF 35-40%, Severe MR with PISA ERO 0.43 cm^2. Suspect infarct related MR with tethering of the posterior leaflet.   ECHO 03/01/2017  Mildly dilated LV with EF 25-30%, diffuse hypokinesis with   inferolateral akinesis. Restrictive diastolic function. RV poorly   visualized but appears normal in size with mildly decreased   systolic function. D-shaped interventricular septum is suggestive   of RV pressure/volume overload. Moderate pulmonary hypertension.   Moderate to severe mitral regurgitation, suspect infarct-related   MR with restricted posterior leaflet and akinetic inferolateral   wall.  LHC 03/03/2017   Previously placed Prox Cx to Mid Cx  stent (unknown type) is widely patent.  Prox Cx lesion is 45% stenosed. - proximal to the Stent -- FFR 0.88.  Prox RCA to Mid RCA stent is 55% stenosed.  Dist LAD lesion is 20% stenosed.  LV end diastolic pressure is severely elevated. 30-31 mmHg  Review of systems complete and found to be negative unless listed in HPI.    SH:  Social History   Socioeconomic History  . Marital status: Married    Spouse name: Not on file  . Number of children: Not on file  . Years of education: Not on file  . Highest education level: Not on file  Occupational History  . Not on file  Social Needs  . Financial resource strain: Not on file  . Food insecurity:    Worry: Not on file    Inability: Not on file  . Transportation needs:    Medical: Not on file    Non-medical: Not on file  Tobacco Use  . Smoking status: Former Smoker    Packs/day: 0.50    Years: 17.00    Pack years: 8.50    Types: Cigarettes  . Smokeless tobacco: Never Used  . Tobacco comment: quit since heart attack in 10/2015  Substance and Sexual Activity  . Alcohol use: Yes    Alcohol/week: 4.8 oz    Types: 8 Shots of liquor per week  . Drug use: Yes    Types: Marijuana  . Sexual activity: Not on file  Lifestyle  . Physical activity:    Days per week: Not on file    Minutes per session: Not on file  . Stress: Not on file  Relationships  . Social connections:    Talks on phone: Not on file    Gets together: Not on file    Attends religious service: Not on file    Active member of club or organization: Not on file    Attends meetings of clubs or organizations: Not on file    Relationship status: Not on file  . Intimate partner violence:    Fear of current or ex partner: Not on file    Emotionally abused: Not on file    Physically abused: Not on file    Forced sexual activity: Not on file  Other Topics Concern  . Not on file  Social History Narrative   The patient is married with two children.    FH:    Family History  Problem Relation Age of Onset  . Other Sister 49       THE PATIENT REPORTS A SISTER WITH A MYOCARDIAL INFARCTION IN HER 30s.(SHE IS NOW S/P CABG)  . Heart attack Father   . Deep vein thrombosis Father     Past Medical History:  Diagnosis Date  . Asthma   . CAD (coronary artery disease)   . Chronic systolic CHF (congestive heart failure) (HCC)   . HLD (hyperlipidemia)    Qualifier: Diagnosis of  By: Antoine Poche, MD, Gerrit Heck    . Hypercholesteremia   . Inferior myocardial infarction [facility]) 2007   PCI of RCA  . Inferior myocardial infarction (HCC) 03/31/2008   PCI of RCA with bare metal stent  . Ischemic cardiomyopathy   . Mitral regurgitation   . Obstructive sleep apnea    Qualifier: Diagnosis of  By: Antoine Poche, MD, Gerrit Heck    . 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 Pulmonary embolism (HCC) 02/28/2017  . Tobacco abuse     Current Outpatient Medications  Medication Sig Dispense Refill  . atorvastatin (LIPITOR) 80 MG tablet Take 1 tablet (80 mg total) by mouth daily. 30 tablet 2  . carvedilol (COREG) 3.125 MG tablet Take 1 tablet (3.125 mg total) by mouth 2 (two) times daily with a meal. 60 tablet 2  . furosemide (LASIX) 40 MG tablet Take 1 tablet (40 mg total) by mouth daily. 30 tablet 11  . losartan (COZAAR) 25 MG tablet Take 1 tablet (25 mg total) by mouth daily. 30 tablet 2  . nitroGLYCERIN (NITROSTAT) 0.4 MG SL tablet Place 1 tablet (0.4 mg total) under the tongue every 5 (five) minutes as needed for chest pain. 25 tablet 2  . rivaroxaban (XARELTO) 20 MG TABS tablet Take 1 tablet (20 mg total) by mouth at bedtime. 30 tablet 2  . spironolactone (ALDACTONE) 25 MG tablet Take 1 tablet (25 mg total) by mouth daily. 30 tablet 2   No current facility-administered medications for this visit.     Vitals:   05/05/17 0925  BP: (!) 84/0  Pulse: 83  SpO2: 98%  Weight: 252 lb 6.4 oz (114.5 kg)    Wt Readings from  Last 3 Encounters:  05/05/17 252 lb 6.4 oz (114.5 kg)  04/28/17 249 lb (112.9 kg)  04/19/17 241 lb (109.3 kg)    PHYSICAL EXAM: General: Well appearing. No resp difficulty. HEENT: Normal Neck: Supple. JVP 6-7 cm. Carotids 2+ bilat; no bruits. No thyromegaly or  nodule noted. Cor: PMI nondisplaced. RRR, No M/G/R noted Lungs: CTAB, normal effort. Abdomen: Soft, non-tender, non-distended, no HSM. No bruits or masses. +BS  Extremities: No cyanosis, clubbing, or rash. R and LLE no edema.  Neuro: Alert & orientedx3, cranial nerves grossly intact. moves all 4 extremities w/o difficulty. Affect pleasant   ASSESSMENT & PLAN: 1. Chronic Systolic Heart Failure, ICM ECHO 02/2017 25-30%.  - NYHA II-III symptoms, confounded by fatigue. - Volume status stable to dry on exam despite weight gain.  - with hypotension, hold lasix today and tomorrow. Then resume 40 mg daily.  - Hold coreg today then resume tomorrow 3.125 mg BID - Decrease spiro to 12.5 mg (start tomorrow) - Decrease losartan 12.5 mg qhs (start tomorrow) - Reinforced fluid restriction to < 2 L daily, sodium restriction to less than 2000 mg daily, and the importance of daily weights.   2. CAD - Stents in LCX and RCA. LHC cath 02/2017 nonobstructive disease.  - No s/s of ischemia.    - Continue statin, bb.  - On xarelto so no aspirin.  3. PE - CTA 02/2017 with PE note.  - ? Etiology. Father has venous atheroembolism.  - + lupus anticoagulant but with xarelto false positive so no need for hematology consult. .  - Continue lifelong Xarelto.   4. Mitral Regurgitation - TEE 04/19/17 - LVEF 35-40%, Severe MR with PISA ERO 0.43 cm^2. Suspect infarct related MR with tethering of the posterior leaflet.  - Has seen Dr. Cornelius Moraswen. Plan for MVR later this year due to family issues.  5. Former Smoker - Encouraged continued smoking sensation.  6. Hypotension - Meds as above. Not orthostatic on exam. No lightheadedness or near-syncope.  - Labs today.    Labs today. Close 2 week follow up.   Mariam DollarMichael Andrew Shenouda Genova, PA-C  10:12 AM   Greater than 50% of the 25 minute visit was spent in counseling/coordination of care regarding disease state education, salt/fluid restriction, sliding scale diuretics, and medication compliance.

## 2017-05-04 NOTE — Progress Notes (Signed)
DENTAL CONSULTATION  Date of Consultation:  05/04/2017 Patient Name:   Joshua May Date of Birth:   05/14/1975 Medical Record Number: [id number]  VITALS: BP 118/69 (BP Location: Right Arm)   Pulse 83   Temp 98.2 F (36.8 C)   CHIEF COMPLAINT: Patient referred by Dr. Cornelius Moras for dental consultation.  HPI: Joshua May is a 42 year old male recently diagnosed with severe mitral regurgitation. Patient with anticipated mitral valve repair or replacement with Dr. Cornelius Moras. Patient is now seen as part of a medically necessary pre-heart valve surgery dental protocol examination to rule out dental infection that may affect the patient's systemic health and anticipated heart valve surgery.  Patient currently denies having any acute toothaches, swellings, or abscesses. Patient has not seen a dentist for a long time. This has been over 10-15 years. Patient denies having regular dental care. Patient denies having partial dentures. Patient denies having dental phobia.  PROBLEM LIST: Patient Active Problem List   Diagnosis Date Noted  . Lupus anticoagulant disorder (HCC) 03/10/2017  . Chronic systolic CHF (congestive heart failure) (HCC)   . Mitral regurgitation   . Ischemic cardiomyopathy   . Pulmonary embolism (HCC) 02/28/2017  . CAD (coronary artery disease)   . DYSPHAGIA UNSPECIFIED 09/04/2008  . HLD (hyperlipidemia) 04/27/2008  . Obstructive sleep apnea 04/27/2008  . TOBACCO ABUSE 04/26/2008  . ASTHMA 04/26/2008    PMH: Past Medical History:  Diagnosis Date  . Asthma   . CAD (coronary artery disease)   . Chronic systolic CHF (congestive heart failure) (HCC)   . HLD (hyperlipidemia)    Qualifier: Diagnosis of  By: Antoine Poche, MD, Gerrit Heck    . Hypercholesteremia   . Inferior myocardial infarction [facility]) 2007   PCI of RCA  . Inferior myocardial infarction (HCC) 03/31/2008   PCI of RCA with bare metal stent  . Ischemic cardiomyopathy   . Mitral regurgitation   . Obstructive sleep  apnea    Qualifier: Diagnosis of  By: Antoine Poche, MD, Gerrit Heck    . 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 Pulmonary embolism (HCC) 02/28/2017  . Tobacco abuse     PSH: Past Surgical History:  Procedure Laterality Date  . CARDIAC CATHETERIZATION N/A 10/20/2015   Procedure: Left Heart Cath and Coronary Angiography;  Surgeon: Kathleene Hazel, MD;  Location: [facility] INVASIVE CV LAB;  Service: Cardiovascular;  Laterality: N/A;  . CARDIAC CATHETERIZATION N/A 10/20/2015   Procedure: Coronary Stent Intervention;  Surgeon: Kathleene Hazel, MD;  Location: [facility] INVASIVE CV LAB;  Service: Cardiovascular;  Laterality: N/A;  . INTRAVASCULAR PRESSURE WIRE/FFR STUDY N/A 03/03/2017   Procedure: INTRAVASCULAR PRESSURE WIRE/FFR STUDY;  Surgeon: Marykay Lex, MD;  Location: [facility] INVASIVE CV LAB;  Service: Cardiovascular;  Laterality: N/A;  . LEFT HEART CATH AND CORONARY ANGIOGRAPHY N/A 03/03/2017   Procedure: LEFT HEART CATH AND CORONARY ANGIOGRAPHY;  Surgeon: Marykay Lex, MD;  Location: [facility] INVASIVE CV LAB;  Service: Cardiovascular;  Laterality: N/A;  . none    . OTHER SURGICAL HISTORY     NONE  . TEE WITHOUT CARDIOVERSION N/A 04/19/2017   Procedure: TRANSESOPHAGEAL ECHOCARDIOGRAM (TEE);  Surgeon: Laurey Morale, MD;  Location: [facility] ENDOSCOPY;  Service: Cardiovascular;  Laterality: N/A;    ALLERGIES: Allergies  Allergen Reactions  . Shellfish Allergy Anaphylaxis    MEDICATIONS: Current Outpatient Medications  Medication Sig Dispense Refill  . atorvastatin (LIPITOR) 80 MG tablet Take 1 tablet (80 mg total) by mouth daily.  30 tablet 2  . carvedilol (COREG) 3.125 MG tablet Take 1 tablet (3.125 mg total) by mouth 2 (two) times daily with a meal. 60 tablet 2  . furosemide (LASIX) 40 MG tablet Take 1 tablet (40 mg total) by mouth daily. 30 tablet 11  . losartan (COZAAR) 25 MG tablet Take 1 tablet (25 mg total) by mouth daily. 30 tablet 2  . nitroGLYCERIN  (NITROSTAT) 0.4 MG SL tablet Place 1 tablet (0.4 mg total) under the tongue every 5 (five) minutes as needed for chest pain. 25 tablet 2  . rivaroxaban (XARELTO) 20 MG TABS tablet Take 1 tablet (20 mg total) by mouth at bedtime. 30 tablet 2  . spironolactone (ALDACTONE) 25 MG tablet Take 1 tablet (25 mg total) by mouth daily. 30 tablet 2   No current facility-administered medications for this visit.     LABS: Lab Results  Component Value Date   WBC 8.6 04/02/2017   HGB 13.0 04/02/2017   HCT 40.8 04/02/2017   MCV 89.3 04/02/2017   PLT 246 04/02/2017      Component Value Date/Time   NA 140 04/07/2017 1104   K 3.9 04/07/2017 1104   CL 107 04/07/2017 1104   CO2 27 04/07/2017 1104   GLUCOSE 118 (H) 04/07/2017 1104   BUN 19 04/07/2017 1104   CREATININE 0.95 04/07/2017 1104   CREATININE 1.22 11/01/2015 1213   CALCIUM 8.9 04/07/2017 1104   GFRNONAA >60 04/07/2017 1104   GFRAA >60 04/07/2017 1104   Lab Results  Component Value Date   INR 1.31 03/03/2017   INR 0.99 10/20/2015   No results found for: PTT  SOCIAL HISTORY: Social History   Socioeconomic History  . Marital status: Married    Spouse name: Not on file  . Number of children: Not on file  . Years of education: Not on file  . Highest education level: Not on file  Occupational History  . Not on file  Social Needs  . Financial resource strain: Not on file  . Food insecurity:    Worry: Not on file    Inability: Not on file  . Transportation needs:    Medical: Not on file    Non-medical: Not on file  Tobacco Use  . Smoking status: Former Smoker    Packs/day: 0.50    Years: 17.00    Pack years: 8.50    Types: Cigarettes  . Smokeless tobacco: Never Used  . Tobacco comment: quit since heart attack in 10/2015  Substance and Sexual Activity  . Alcohol use: Yes    Alcohol/week: 4.8 oz    Types: 8 Shots of liquor per week  . Drug use: Yes    Types: Marijuana  . Sexual activity: Not on file  Lifestyle  .  Physical activity:    Days per week: Not on file    Minutes per session: Not on file  . Stress: Not on file  Relationships  . Social connections:    Talks on phone: Not on file    Gets together: Not on file    Attends religious service: Not on file    Active member of club or organization: Not on file    Attends meetings of clubs or organizations: Not on file    Relationship status: Not on file  . Intimate partner violence:    Fear of current or ex partner: Not on file    Emotionally abused: Not on file    Physically abused: Not on file  Forced sexual activity: Not on file  Other Topics Concern  . Not on file  Social History Narrative   The patient is married with two children.    FAMILY HISTORY: Family History  Problem Relation Age of Onset  . Other Sister [id number]       THE PATIENT REPORTS A SISTER WITH A MYOCARDIAL INFARCTION IN HER 30s.(SHE IS NOW S/P CABG)  . Heart attack Father   . Deep vein thrombosis Father     REVIEW OF SYSTEMS: Reviewed with the patient as per History of present illness. Psych: Patient denies having dental phobia.  DENTAL HISTORY: CHIEF COMPLAINT: Patient referred by Dr. Cornelius Moraswen for dental consultation.  HPI: [name] H Gerwig is a 42 year old male recently diagnosed with severe mitral regurgitation. Patient with anticipated mitral valve repair or replacement with Dr. Cornelius Moraswen. Patient is now seen as part of a medically necessary pre-heart valve surgery dental protocol examination to rule out dental infection that may affect the patient's systemic health and anticipated heart valve surgery.  Patient currently denies having any acute toothaches, swellings, or abscesses. Patient has not seen a dentist for a long time. This has been over 10-15 years. Patient denies having regular dental care. Patient denies having partial dentures. Patient denies having dental phobia.  DENTAL EXAMINATION: GENERAL:  The patient is a well-developed, well-nourished male in no acute  distress. HEAD AND NECK:  There is no palpable neck lymphadenopathy. The patient denies acute TMJ symptoms. INTRAORAL EXAM:  The patient has normal saliva. There is no evidence of oral abscess formation. The patient has bilateral mandibular lingual tori. DENTITION:  Patient is missing tooth number 32. There are retained roots in the area of tooth numbers 2 and 17. PERIODONTAL:  The patient has chronic periodontitis with plaque and calculus accumulations, gingival recession, and incipient to moderate bone loss.  Radiographic calculus is noted. DENTAL CARIES/SUBOPTIMAL RESTORATIONS:  Multiple dental caries are noted as per dental charting form. ENDODONTIC: No history of acute pulpitis. Patient does have a periapical radiolucency associated with tooth #17. CROWN AND BRIDGE:  There are no crown or bridge restorations. PROSTHODONTIC:  Patient denies having partial dentures. OCCLUSION: Patient has a poor occlusal scheme secondary to multiple missing teeth, retained root segments, and lack replacement of missing teeth dental prostheses.  RADIOGRAPHIC INTERPRETATION: An Orthopantogram as taken and supplemented with a full series of dental radiographs. There is missing tooth #32.there are retained roots in the area tooth numbers 2 and 17. Dental caries are noted. There is incipient to moderate bone loss. Radiographic calculus is noted.There is supra-eruption and drifting of the unopposed teeth into the edentulous areas.   ASSESSMENTS: 1. Severe mitral regurgitation 2. Pre-heart valve surgery dental protocol 3. Chronic apical periodontitis 4. Retained root segments 5. Dental caries 6. Chronic periodontitis with bone loss 7. Accretions 8. Gingival recession  9. Incipient tooth mobility 10. Missing tooth #32. 11.Supra-eruption and drifting of the unopposed teeth into the edentulous areas 12. Malocclusion 13. Bilateral mandibular lingual tori 14. Risk for bleeding with invasive dental procedures due  to current Xarelto therapy 15. Risk for complications up to and including death with anticipated invasive dental procedures in the operating room with general anesthesia due to cardiovascular and respiratory compromise. 16. Evaluate need for antibiotic premedication prior to invasive dental procedures due to anticipated heart valve surgery in the near future.  PLAN/RECOMMENDATIONS: 1. I discussed the risks, benefits, and complications of various treatment options with the patient in relationship to his medical and dental conditions, anticipated  heart valve surgery, and risk for endocarditis. We discussed various treatment options to include no treatment, multiple extractions with alveoloplasty, pre-prosthetic surgery as indicated, periodontal therapy, dental restorations, root canal therapy, crown and bridge therapy, implant therapy, and replacement of missing teeth as indicated. The patient currently wishes to proceed with multiple extractions with alveoloplasty and gross debridement of remaining dentition the operating room with general anesthesia.  This has been scheduled for 05/13/2017 at 7:30 AM.  The patient is to discontinue his Xarelto therapy after his dose on Monday, 05/10/2017.  The Xarelto therapy will be restarted on the day after his oral surgery barring any complications with bleeding.  The patient will then proceed with restoration of tooth #15 with a dentist of his choice prior to proceeding with his anticipated heart valve surgery in June 2019.   2. Discussion of findings with medical team and coordination of future medical and dental care as needed.  I spent in excess of  120 minutes during the conduct of this consultation and >50% of this time involved direct face-to-face encounter for counseling and/or coordination of the patient's care.    Charlynne Pander, DDS

## 2017-05-04 NOTE — Patient Instructions (Signed)
[place]    Department of Dental Medicine     DR. KULINSKI      HEART VALVES AND MOUTH CARE:  FACTS:   If you have any infection in your mouth, it can infect your heart valve.  If you heart valve is infected, you will be seriously ill.  Infections in the mouth can be SILENT and do not always cause pain.  Examples of infections in the mouth are gum disease, dental cavities, and abscesses.  Some possible signs of infection are: Bad breath, bleeding gums, or teeth that are sensitive to sweets, hot, and/or cold. There are many other signs as well.  WHAT YOU HAVE TO DO:   Brush your teeth after meals and at bedtime. Spend at least 2 minutes brushing well, especially behind your back teeth and all around your teeth that stand alone. Brush at the gumline also.  Do not go to bed without brushing your teeth and flossing.  If you gums bleed when you brush or floss, do NOT stop brushing or flossing. It usually means that your gums need more attention and better cleaning.   If your Dentist or Dr. Kulinski gave you a prescription mouthwash to use, make sure to use it as directed. If you run out of the medication, get a refill at the pharmacy.   If you were given any other medications or directions by your Dentist, please follow them. If you did not understand the directions or forget what you were told, please call. We will be happy to refresh her memory.  If you need antibiotics before dental procedures, make sure you take them one hour prior to every dental visit as directed.   Get a dental checkup every 4-6 months in order to keep your mouth healthy, or to find and treat any new infection. You will most likely need your teeth cleaned or gums treated at the same time.  If you are not able to come in for your scheduled appointment, call your Dentist as soon as possible to reschedule.  If you have a problem in between dental visits, call your Dentist.  

## 2017-05-05 ENCOUNTER — Ambulatory Visit (HOSPITAL_COMMUNITY)
Admission: RE | Admit: 2017-05-05 | Discharge: 2017-05-05 | Disposition: A | Discharge status: Home / Self Care | Payer: Medicaid Other | Source: Ambulatory Visit | Attending: Internal Medicine | Admitting: Internal Medicine

## 2017-05-05 ENCOUNTER — Encounter (HOSPITAL_COMMUNITY): Payer: Self-pay

## 2017-05-05 VITALS — BP 84/0 | HR 83 | Wt 252.4 lb

## 2017-05-05 DIAGNOSIS — Z87891 Personal history of nicotine dependence: Secondary | ICD-10-CM | POA: Diagnosis not present

## 2017-05-05 DIAGNOSIS — Z79899 Other long term (current) drug therapy: Secondary | ICD-10-CM | POA: Diagnosis not present

## 2017-05-05 DIAGNOSIS — I959 Hypotension, unspecified: Secondary | ICD-10-CM | POA: Diagnosis not present

## 2017-05-05 DIAGNOSIS — J45909 Unspecified asthma, uncomplicated: Secondary | ICD-10-CM | POA: Diagnosis not present

## 2017-05-05 DIAGNOSIS — E785 Hyperlipidemia, unspecified: Secondary | ICD-10-CM | POA: Diagnosis not present

## 2017-05-05 DIAGNOSIS — I255 Ischemic cardiomyopathy: Secondary | ICD-10-CM

## 2017-05-05 DIAGNOSIS — I2510 Atherosclerotic heart disease of native coronary artery without angina pectoris: Secondary | ICD-10-CM | POA: Insufficient documentation

## 2017-05-05 DIAGNOSIS — Z7901 Long term (current) use of anticoagulants: Secondary | ICD-10-CM | POA: Insufficient documentation

## 2017-05-05 DIAGNOSIS — Z86711 Personal history of pulmonary embolism: Secondary | ICD-10-CM | POA: Diagnosis not present

## 2017-05-05 DIAGNOSIS — I252 Old myocardial infarction: Secondary | ICD-10-CM | POA: Diagnosis not present

## 2017-05-05 DIAGNOSIS — G4733 Obstructive sleep apnea (adult) (pediatric): Secondary | ICD-10-CM | POA: Diagnosis not present

## 2017-05-05 DIAGNOSIS — I340 Nonrheumatic mitral (valve) insufficiency: Secondary | ICD-10-CM

## 2017-05-05 DIAGNOSIS — I25110 Atherosclerotic heart disease of native coronary artery with unstable angina pectoris: Secondary | ICD-10-CM | POA: Diagnosis not present

## 2017-05-05 DIAGNOSIS — E7800 Pure hypercholesterolemia, unspecified: Secondary | ICD-10-CM | POA: Insufficient documentation

## 2017-05-05 DIAGNOSIS — I272 Pulmonary hypertension, unspecified: Secondary | ICD-10-CM | POA: Insufficient documentation

## 2017-05-05 DIAGNOSIS — F17200 Nicotine dependence, unspecified, uncomplicated: Secondary | ICD-10-CM

## 2017-05-05 DIAGNOSIS — I5022 Chronic systolic (congestive) heart failure: Secondary | ICD-10-CM

## 2017-05-05 LAB — BASIC METABOLIC PANEL
Anion gap: 8 (ref 5–15)
BUN: 14 mg/dL (ref 6–20)
CHLORIDE: 105 mmol/L (ref 101–111)
CO2: 24 mmol/L (ref 22–32)
CREATININE: 1.05 mg/dL (ref 0.61–1.24)
Calcium: 9 mg/dL (ref 8.9–10.3)
GFR calc Af Amer: >60 mL/min (ref >60)
GFR calc non Af Amer: >60 mL/min (ref >60)
Glucose, Bld: 113 mg/dL — ABNORMAL HIGH (ref 65–99)
Potassium: 4.2 mmol/L (ref 3.5–5.1)
SODIUM: 137 mmol/L (ref 135–145)

## 2017-05-05 LAB — CBC
HCT: 38.5 % — ABNORMAL LOW (ref 39.0–52.0)
HEMOGLOBIN: 12.6 g/dL — AB (ref 13.0–17.0)
MCH: 29.7 pg (ref 26.0–34.0)
MCHC: 32.7 g/dL (ref 30.0–36.0)
MCV: 90.8 fL (ref 78.0–100.0)
Platelets: 298 10*3/uL (ref 150–400)
RBC: 4.24 MIL/uL (ref 4.22–5.81)
RDW: 15 % (ref 11.5–15.5)
WBC: 7.8 10*3/uL (ref 4.0–10.5)

## 2017-05-05 LAB — BRAIN NATRIURETIC PEPTIDE: B NATRIURETIC PEPTIDE 5: 40.3 pg/mL (ref 0.0–100.0)

## 2017-05-05 MED ORDER — ATORVASTATIN CALCIUM 80 MG PO TABS: 80.0000 mg | ORAL_TABLET | Freq: Every day | ORAL | 2 refills | Status: DC

## 2017-05-05 MED ORDER — LOSARTAN POTASSIUM 25 MG PO TABS: 12.5000 mg | ORAL_TABLET | Freq: Every day | ORAL | 2 refills | Status: DC

## 2017-05-05 MED ORDER — RIVAROXABAN 20 MG PO TABS: 20.0000 mg | ORAL_TABLET | Freq: Every day | ORAL | 2 refills | Status: DC

## 2017-05-05 MED ORDER — FUROSEMIDE 40 MG PO TABS: 40.0000 mg | ORAL_TABLET | Freq: Every day | ORAL | 2 refills | Status: DC

## 2017-05-05 MED ORDER — SPIRONOLACTONE 25 MG PO TABS: 12.5000 mg | ORAL_TABLET | Freq: Every day | ORAL | 2 refills | Status: DC

## 2017-05-05 MED ORDER — CARVEDILOL 3.125 MG PO TABS: 3.1250 mg | ORAL_TABLET | Freq: Two times a day (BID) | ORAL | 2 refills | Status: DC

## 2017-05-05 MED FILL — LOSARTAN POTASSIUM 25 MG TA: 25 | 30 days supply | Qty: 15 | Fill #0

## 2017-05-05 MED FILL — ATORVASTATIN 80 MG TABLET: 80 | 30 days supply | Qty: 30 | Fill #0

## 2017-05-05 MED FILL — SPIRONOLACTONE 25 MG TABLET: 25 | 30 days supply | Qty: 15 | Fill #0

## 2017-05-05 MED FILL — CARVEDILOL 3.125 MG TABLET: 3.125 | 30 days supply | Qty: 60 | Fill #0

## 2017-05-05 MED FILL — XARELTO 20 MG TABLET: 20 | 30 days supply | Qty: 30 | Fill #0

## 2017-05-05 MED FILL — FUROSEMIDE 40 MG TAB: 40 | 30 days supply | Qty: 30 | Fill #0

## 2017-05-05 NOTE — Progress Notes (Addendum)
Anesthesia PAT Evaluation:   Case:  [id number] Date/Time:  05/13/17 0715   Procedure:  MULTIPLE EXTRACTION WITH ALVEOLOPLASTY AND GROSS DEBRIDEMENT OF REMAINING TEETH (N/A )   Anesthesia type:  General   Pre-op diagnosis:  mitral regurgitation and chronic periodontitis   [facility]:  [facility] OR ROOM 10 / [facility] OR   Surgeon:  Charlynne Pander, DDS     DISCUSSION: PAT scheduled 05/07/17. Patient is a 41 year old male scheduled for multiple teeth extractions with alveoloplasty and gross debridement of remaining teeth on 05/13/17. Per patient, he needs two extractions and deep cleaning. He has severe MR and is currently scheduled for minimally invasive MV repair by Dr. Cornelius Moras on 07/07/17.   History includes CAD (PCI 01/18/06, [place]; inferior STEMI [occluded RCA] s/p BMS RCA 03/31/08; inferolateral STEMI [occluded CX] s/p DES CX, POBA OM3 10/20/15; proximal-mid CX stent widely patent, proximal CX stent 45% stenosis with FFR 0.88, proximal-mid RCA stent 55% stenosis, LVEDP 30-10mmHg, medical Rx for CAD, refer fo possible MV repair 03/03/17), severe mitral regurgitation (suspect infarct related MR with tethering of the posterior leaflet), chronic systolic CHF (EF 04-54% 02/2017, 35-40% 04/2017), PE (diagnosed 02/27/17, but mostly resolved at that time by CTA; unlcear etiology, + family history, + lupus anticoagulant but on Xarelto so could be false positive, life long anticoagulation recommended). 04/2017 TEE showed a small PFO, but negative bubble study.  Per cardiology note by Caryl Comes, RN, last dose of Xarelto (before tooth extraction) should be 05/09/17. Patient aware.  He had labs on 05/05/17, so from an anesthesia standpoint I don't think he will need labs repeated prior to teeth extractions other than a PT/INR on the day of surgery.   Anesthesia consult requested to assess ability to proceed with general anesthesia with nasoendotracheal intubation. He denied known anesthesia complications. Discussed possibility of nasal  tube. He will be off Xarelto for > 72 hours. He denied history of epistaxis, nasal or skull fractures, deviated septum.  He does snore. Nares did appear somewhat small.   Patient does not exhibit any acute CV/CHF symptoms on today's exam. If no acute changes then I anticipate that he can proceed as planned. Anesthesiologist to re-evaluate on the day of surgery.    VS: BP 108/64   Pulse 86   Temp 36.8 C (Oral)   Resp 20   Ht 5\' 11"  (1.803 m)   Wt 256 lb 6 oz (116.3 kg)   SpO2 99%   BMI 35.76 kg/m  He is a quiet, but pleasant black male in NAD. He denied any new symptoms since cardiology visit on 05/05/17. Reports weights have been stable. BP better today (than on 05/05/17) after medication adjustment. He denied dizziness. Heart RRR. I did not appreciate a murmur, although with known severe MR. Lungs clear. No ankle edema. Mallampati III.  PROVIDERS: PCP is with [facility]. Cardiologist is Rollene Rotunda, MD. HF Cardiologist is Arvilla Meres, MD. Last visit was 05/05/17 with Maxine Glenn, PA-C. He is aware of surgery plans.  CT surgeon is Tressie Stalker, MD.  LABS: Labs from 05/05/17 noted. H/H 12.6/38.5. PLT 298. Cr 1.05. Glucose 113. BNP normal at 40.3. He is for PT/INR on the day of surgery.  IMAGES: CTA Chest 02/28/17: IMPRESSION: 1. Small pulmonary infarct suspected at the right lung base, raising concern for mild mostly resolved pulmonary embolus. 2. Associated small right pleural effusion noted. 3. Borderline cardiomegaly.  CXR 02/27/17: IMPRESSION: Enlargement of cardiac silhouette. Minimal chronic bronchitic changes  and tiny RIGHT pleural effusion without acute infiltrate.  EKG: 03/30/17 NSR, inferior infarct (age undetermined)  CV: Echo 04/19/17: Study Conclusions - Left ventricle: Mildly dilated left ventricle with EF 35-40%.   Basal to mid inferior and inferolateral akinesis. - Aortic valve: There was no stenosis. - Aorta: Normal  caliber thoracic aorta with minimal plaque. - Mitral valve: There was severe mitral regurgitation with PISA ERO   0.43 cm^2. Suspect infarct-related MR with tethering of the   posterior leaflet. There was flattening but not frank reversal of   the pulmonary vein doppler pattern. - Left atrium: The atrium was mildly dilated. No evidence of   thrombus in the atrial cavity or appendage. - Right ventricle: The cavity size was normal. Systolic function   was normal. - Right atrium: No evidence of thrombus in the atrial cavity or   appendage. - Atrial septum: There was a small PFO by color doppler but bubble study was negative. - Tricuspid valve: Peak RV-RA gradient (S): 22 mm Hg. Impressions: - Severe infarct-related MR. EF better than it was on last echo. I   will review with cardiac surgeon. As he still is symptomatic with   exertional dyspnea, think he may benefit from MV repair. (Comparison echo 03/01/17: EF 25-30%, diffuse hypokinesis with inferolateral akinesis, moderate-severe MR, peak RV-RA gradient 50 mmHg, PA peak pressure 58 mmHg.)  Cardiac cath 03/03/17: Conclusions:  Previously placed Prox Cx to Mid Cx stent (unknown type) is widely patent.  Prox Cx lesion is 45% stenosed. - proximal to the Stent -- FFR 0.88.  Prox RCA to Mid RCA stent is 55% stenosed.  Dist LAD lesion is 20% stenosed.  LV end diastolic pressure is severely elevated. 30-31 mmHg - Angiographically moderate lesion prior to the circumflex stent which is likely just a step up.  FFR 0.88.  Otherwise minimal CAD.  No change to the in-stent restenosis of the RCA. - Severely elevated EDP, likely related to cardiomyopathy and mitral regurgitation. Plan: Medical management and consider possibility of mitral valve repair.  Past Medical History:  Diagnosis Date  . Asthma   . CAD (coronary artery disease)   . Chronic systolic CHF (congestive heart failure) (HCC)   . HLD (hyperlipidemia)    Qualifier: Diagnosis of   By: Antoine Poche, MD, Gerrit Heck    . Hypercholesteremia   . Inferior myocardial infarction [facility]) 2007   PCI of RCA  . Inferior myocardial infarction (HCC) 03/31/2008   PCI of RCA with bare metal stent  . Ischemic cardiomyopathy   . Mitral regurgitation   . Obstructive sleep apnea    Qualifier: Diagnosis of  By: Antoine Poche, MD, Gerrit Heck    . 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 Pulmonary embolism (HCC) 02/28/2017  . Tobacco abuse     Past Surgical History:  Procedure Laterality Date  . CARDIAC CATHETERIZATION N/A 10/20/2015   Procedure: Left Heart Cath and Coronary Angiography;  Surgeon: Kathleene Hazel, MD;  Location: [facility] INVASIVE CV LAB;  Service: Cardiovascular;  Laterality: N/A;  . CARDIAC CATHETERIZATION N/A 10/20/2015   Procedure: Coronary Stent Intervention;  Surgeon: Kathleene Hazel, MD;  Location: [facility] INVASIVE CV LAB;  Service: Cardiovascular;  Laterality: N/A;  . INTRAVASCULAR PRESSURE WIRE/FFR STUDY N/A 03/03/2017   Procedure: INTRAVASCULAR PRESSURE WIRE/FFR STUDY;  Surgeon: Marykay Lex, MD;  Location: [facility] INVASIVE CV LAB;  Service: Cardiovascular;  Laterality: N/A;  . LEFT HEART CATH AND CORONARY ANGIOGRAPHY N/A 03/03/2017  Procedure: LEFT HEART CATH AND CORONARY ANGIOGRAPHY;  Surgeon: Marykay LexHarding, David W, MD;  [facility];  Service: Cardiovascular;  Laterality: N/A;  . none    . OTHER SURGICAL HISTORY     NONE  . TEE WITHOUT CARDIOVERSION N/A 04/19/2017   Procedure: TRANSESOPHAGEAL ECHOCARDIOGRAM (TEE);  Surgeon: Laurey MoraleMcLean, Dalton S, MD;  [facility];  Service: Cardiovascular;  Laterality: N/A;    MEDICATIONS: . atorvastatin (LIPITOR) 80 MG tablet  . BLACK CURRANT SEED OIL PO  . carvedilol (COREG) 3.125 MG tablet  . furosemide (LASIX) 40 MG tablet  . losartan (COZAAR) 25 MG tablet  . nitroGLYCERIN (NITROSTAT) 0.4 MG SL tablet  . rivaroxaban (XARELTO) 20 MG TABS tablet  . spironolactone  (ALDACTONE) 25 MG tablet   No current facility-administered medications for this encounter.    Velna Ochsllison Degan Hanser, PA-C [facility]Anesthesiology Phone [phone] 05/07/2017 10:55 AM

## 2017-05-05 NOTE — Patient Instructions (Addendum)
Routine lab work today. Will notify you of abnormal results, otherwise no news is good news!  DECREASE Losartan to 12.5 mg (1/2 tablet) once daily at bedtime starting TOMORROW.  DECREASE Spironolactone to 12.5 mg (1/2 tablet) once daily starting TOMORROW.  HOLD Lasix until Friday, then resume back at normal daily dose.  HOLD Coreg today, then resume tomorrow at normal daily dose.  Last dose of Xarelto on Sunday 4/21 (before tooth extraction).  Follow up 2 weeks with Otilio SaberAndy Tillery PA-C.  __________________________________________________________ [place] RidgeGarage Code:    Take all medication as prescribed the day of your appointment. Bring all medications with you to your appointment.  Do the following things EVERYDAY: 1) Weigh yourself in the morning before breakfast. Write it down and keep it in a log. 2) Take your medicines as prescribed 3) Eat low salt foods-Limit salt (sodium) to 2000 mg per day.  4) Stay as active as you can everyday 5) Limit all fluids for the day to less than 2 liters

## 2017-05-06 NOTE — Pre-Procedure Instructions (Signed)
Joshua May  05/06/2017      [facility] - [place], [place] - [address] [facility]. [address] [place] [address] Phone: [phone] Fax: [phone]    Your procedure is scheduled on May 13, 2017.  Report to [facility] Admitting at 530 AM.  Call this number if you have problems the morning of surgery:  [phone]   Remember:  Do not eat food or drink liquids after midnight.  Take these medicines the morning of surgery with A SIP OF WATER  Carvedilol (coreg)  Follow your doctor's instructions on when to hold xarelto (last dose 05/09/17-per Dr. orders)    7 days prior to surgery STOP taking any Aspirin (unless otherwise instructed by your surgeon), Aleve, Naproxen, Ibuprofen, Motrin, Advil, Goody's, BC's, all herbal medications, fish oil, and all vitamins  Continue all other medications as instructed by your physician except follow the above medication instructions before surgery  Contacts, dentures or bridgework may not be worn into surgery.  Leave your suitcase in the car.  After surgery it may be brought to your room.  For patients admitted to the hospital, discharge time will be determined by your treatment team.  Patients discharged the day of surgery will not be allowed to drive home.    [facility]- Preparing For Surgery  Before surgery, you can play an important role. Because skin is not sterile, your skin needs to be as free of germs as possible. You can reduce the number of germs on your skin by washing with CHG (chlorahexidine gluconate) Soap before surgery.  CHG is an antiseptic cleaner which kills germs and bonds with the skin to continue killing germs even after washing.  Please do not use if you have an allergy to CHG or antibacterial soaps. If your skin becomes reddened/irritated stop using the CHG.  Do not shave (including legs and underarms) for at least 48 hours prior to first CHG shower. It is OK to  shave your face.  Please follow these instructions carefully.   1. Shower the NIGHT BEFORE SURGERY and the MORNING OF SURGERY with CHG.   2. If you chose to wash your hair, wash your hair first as usual with your normal shampoo.  3. After you shampoo, rinse your hair and body thoroughly to remove the shampoo.  4. Use CHG as you would any other liquid soap. You can apply CHG directly to the skin and wash gently with a scrungie or a clean washcloth.   5. Apply the CHG Soap to your body ONLY FROM THE NECK DOWN.  Do not use on open wounds or open sores. Avoid contact with your eyes, ears, mouth and genitals (private parts). Wash Face and genitals (private parts)  with your normal soap.  6. Wash thoroughly, paying special attention to the area where your surgery will be performed.  7. Thoroughly rinse your body with warm water from the neck down.  8. DO NOT shower/wash with your normal soap after using and rinsing off the CHG Soap.  9. Pat yourself dry with a CLEAN TOWEL.  10. Wear CLEAN PAJAMAS to bed the night before surgery, wear comfortable clothes the morning of surgery  11. Place CLEAN SHEETS on your bed the night of your first shower and DO NOT SLEEP WITH PETS.  Day of Surgery: Do not apply any deodorants/lotions. Please wear clean clothes to the hospital/surgery center.     Do not wear jewelry, make-up or nail  polish.  Do not wear lotions, powders, or perfumes, or deodorant.  Men may shave face and neck.  Do not bring valuables to the hospital.  [facility] is not responsible for any belongings or valuables.  Please read over the following fact sheets that you were given. Pain Booklet, Coughing and Deep Breathing and Surgical Site Infection Prevention

## 2017-05-07 ENCOUNTER — Encounter (HOSPITAL_COMMUNITY): Payer: Self-pay

## 2017-05-07 ENCOUNTER — Other Ambulatory Visit: Payer: Self-pay

## 2017-05-07 ENCOUNTER — Encounter (HOSPITAL_COMMUNITY)
Admission: RE | Admit: 2017-05-07 | Discharge: 2017-05-07 | Disposition: A | Discharge status: Home / Self Care | Payer: Medicaid Other | Source: Ambulatory Visit | Attending: Dentistry | Admitting: Dentistry

## 2017-05-07 DIAGNOSIS — Z01818 Encounter for other preprocedural examination: Secondary | ICD-10-CM | POA: Insufficient documentation

## 2017-05-07 NOTE — Progress Notes (Addendum)
PCP: [facility]  Cardiologist: Marca Anconaalton McLean, MD  EKG: 03/30/17 in EPIC  Stress test:  ECHO: 04/29/17 in EPIC  Cardiac Cath: 03/03/17 in EPIC  Chest x-ray: 02/27/17 in Epic  Patient is aware last dose of xarelto 05/09/17

## 2017-05-11 ENCOUNTER — Ambulatory Visit: Payer: Medicaid Other | Admitting: Thoracic Surgery (Cardiothoracic Vascular Surgery)

## 2017-05-12 MED ORDER — CEFAZOLIN SODIUM-DEXTROSE 2-4 GM/100ML-% IV SOLN
2.0000 g | Freq: Once | INTRAVENOUS | Status: AC
  Administered 2017-05-13: 2 g via INTRAVENOUS
  Filled 2017-05-12: qty 100

## 2017-05-13 ENCOUNTER — Encounter (HOSPITAL_COMMUNITY): Payer: Self-pay

## 2017-05-13 ENCOUNTER — Ambulatory Visit (HOSPITAL_COMMUNITY): Payer: Medicaid Other | Admitting: Vascular Surgery

## 2017-05-13 ENCOUNTER — Ambulatory Visit (HOSPITAL_COMMUNITY)
Admission: RE | Admit: 2017-05-13 | Discharge: 2017-05-13 | Disposition: A | Discharge status: Home / Self Care | Payer: Medicaid Other | Source: Ambulatory Visit | Attending: Dentistry | Admitting: Dentistry

## 2017-05-13 ENCOUNTER — Encounter (HOSPITAL_COMMUNITY)
Admission: RE | Disposition: A | Discharge status: Home / Self Care | Payer: Self-pay | Source: Ambulatory Visit | Attending: Dentistry

## 2017-05-13 DIAGNOSIS — Z79899 Other long term (current) drug therapy: Secondary | ICD-10-CM | POA: Insufficient documentation

## 2017-05-13 DIAGNOSIS — K045 Chronic apical periodontitis: Secondary | ICD-10-CM

## 2017-05-13 DIAGNOSIS — K036 Deposits [accretions] on teeth: Secondary | ICD-10-CM | POA: Insufficient documentation

## 2017-05-13 DIAGNOSIS — I252 Old myocardial infarction: Secondary | ICD-10-CM | POA: Diagnosis not present

## 2017-05-13 DIAGNOSIS — Z955 Presence of coronary angioplasty implant and graft: Secondary | ICD-10-CM | POA: Diagnosis not present

## 2017-05-13 DIAGNOSIS — G4733 Obstructive sleep apnea (adult) (pediatric): Secondary | ICD-10-CM | POA: Diagnosis not present

## 2017-05-13 DIAGNOSIS — E785 Hyperlipidemia, unspecified: Secondary | ICD-10-CM | POA: Insufficient documentation

## 2017-05-13 DIAGNOSIS — K029 Dental caries, unspecified: Secondary | ICD-10-CM | POA: Insufficient documentation

## 2017-05-13 DIAGNOSIS — Z7901 Long term (current) use of anticoagulants: Secondary | ICD-10-CM | POA: Diagnosis not present

## 2017-05-13 DIAGNOSIS — Z86711 Personal history of pulmonary embolism: Secondary | ICD-10-CM | POA: Diagnosis not present

## 2017-05-13 DIAGNOSIS — D6862 Lupus anticoagulant syndrome: Secondary | ICD-10-CM | POA: Insufficient documentation

## 2017-05-13 DIAGNOSIS — K0530 Chronic periodontitis, unspecified: Secondary | ICD-10-CM

## 2017-05-13 DIAGNOSIS — I340 Nonrheumatic mitral (valve) insufficiency: Secondary | ICD-10-CM | POA: Insufficient documentation

## 2017-05-13 DIAGNOSIS — I5022 Chronic systolic (congestive) heart failure: Secondary | ICD-10-CM | POA: Insufficient documentation

## 2017-05-13 DIAGNOSIS — I2510 Atherosclerotic heart disease of native coronary artery without angina pectoris: Secondary | ICD-10-CM | POA: Insufficient documentation

## 2017-05-13 DIAGNOSIS — K083 Retained dental root: Secondary | ICD-10-CM | POA: Insufficient documentation

## 2017-05-13 DIAGNOSIS — Z01818 Encounter for other preprocedural examination: Secondary | ICD-10-CM

## 2017-05-13 HISTORY — PX: MULTIPLE EXTRACTIONS WITH ALVEOLOPLASTY: SHX5342

## 2017-05-13 LAB — PROTIME-INR
INR: 1.02
Prothrombin Time: 13.3 seconds (ref 11.4–15.2)

## 2017-05-13 SURGERY — MULTIPLE EXTRACTION WITH ALVEOLOPLASTY
Anesthesia: General | Site: Mouth

## 2017-05-13 MED ORDER — ESMOLOL HCL 100 MG/10ML IV SOLN
INTRAVENOUS | Status: DC | PRN
  Administered 2017-05-13 (×2): 20 mg via INTRAVENOUS
  Administered 2017-05-13: 40 mg via INTRAVENOUS
  Administered 2017-05-13: 20 mg via INTRAVENOUS

## 2017-05-13 MED ORDER — ETOMIDATE 2 MG/ML IV SOLN
INTRAVENOUS | Status: AC
  Filled 2017-05-13: qty 10

## 2017-05-13 MED ORDER — PHENYLEPHRINE 40 MCG/ML (10ML) SYRINGE FOR IV PUSH (FOR BLOOD PRESSURE SUPPORT)
PREFILLED_SYRINGE | INTRAVENOUS | Status: AC
  Filled 2017-05-13: qty 10

## 2017-05-13 MED ORDER — ROCURONIUM BROMIDE 10 MG/ML (PF) SYRINGE
PREFILLED_SYRINGE | INTRAVENOUS | Status: AC
  Filled 2017-05-13: qty 5

## 2017-05-13 MED ORDER — HEMOSTATIC AGENTS (NO CHARGE) OPTIME
TOPICAL | Status: DC | PRN
  Administered 2017-05-13: 1 via TOPICAL

## 2017-05-13 MED ORDER — ETOMIDATE 2 MG/ML IV SOLN
INTRAVENOUS | Status: DC | PRN
  Administered 2017-05-13: 20 mg via INTRAVENOUS

## 2017-05-13 MED ORDER — DEXAMETHASONE SODIUM PHOSPHATE 10 MG/ML IJ SOLN
INTRAMUSCULAR | Status: DC | PRN
  Administered 2017-05-13: 10 mg via INTRAVENOUS

## 2017-05-13 MED ORDER — ESMOLOL HCL 100 MG/10ML IV SOLN
INTRAVENOUS | Status: AC
  Filled 2017-05-13: qty 20

## 2017-05-13 MED ORDER — ONDANSETRON HCL 4 MG/2ML IJ SOLN
INTRAMUSCULAR | Status: AC
  Filled 2017-05-13: qty 2

## 2017-05-13 MED ORDER — FENTANYL CITRATE (PF) 100 MCG/2ML IJ SOLN
25.0000 ug | INTRAMUSCULAR | Status: DC | PRN
  Administered 2017-05-13: 50 ug via INTRAVENOUS
  Administered 2017-05-13: 25 ug via INTRAVENOUS

## 2017-05-13 MED ORDER — ROCURONIUM BROMIDE 10 MG/ML (PF) SYRINGE
PREFILLED_SYRINGE | INTRAVENOUS | Status: DC | PRN
  Administered 2017-05-13: 50 mg via INTRAVENOUS
  Administered 2017-05-13: 20 mg via INTRAVENOUS
  Administered 2017-05-13: 50 mg via INTRAVENOUS

## 2017-05-13 MED ORDER — AMINOCAPROIC ACID SOLUTION 5% (50 MG/ML)
ORAL | Status: DC | PRN
  Administered 2017-05-13: 100 mL via ORAL

## 2017-05-13 MED ORDER — BUPIVACAINE-EPINEPHRINE (PF) 0.5% -1:200000 IJ SOLN
INTRAMUSCULAR | Status: AC
  Filled 2017-05-13: qty 3.6

## 2017-05-13 MED ORDER — HYDROCODONE-ACETAMINOPHEN 5-325 MG PO TABS
ORAL_TABLET | ORAL | Status: AC
  Filled 2017-05-13: qty 1

## 2017-05-13 MED ORDER — HYDROCODONE-ACETAMINOPHEN 5-325 MG PO TABS: 1.0000 | ORAL_TABLET | Freq: Four times a day (QID) | ORAL | 0 refills | Status: DC | PRN

## 2017-05-13 MED ORDER — PROPOFOL 10 MG/ML IV BOLUS
INTRAVENOUS | Status: AC
  Filled 2017-05-13: qty 20

## 2017-05-13 MED ORDER — FENTANYL CITRATE (PF) 250 MCG/5ML IJ SOLN
INTRAMUSCULAR | Status: AC
  Filled 2017-05-13: qty 5

## 2017-05-13 MED ORDER — FENTANYL CITRATE (PF) 100 MCG/2ML IJ SOLN
INTRAMUSCULAR | Status: AC
  Filled 2017-05-13: qty 2

## 2017-05-13 MED ORDER — SUGAMMADEX SODIUM 500 MG/5ML IV SOLN
INTRAVENOUS | Status: AC
Start: 2017-05-13 — End: 2017-05-13
  Filled 2017-05-13: qty 5

## 2017-05-13 MED ORDER — PHENYLEPHRINE 40 MCG/ML (10ML) SYRINGE FOR IV PUSH (FOR BLOOD PRESSURE SUPPORT)
PREFILLED_SYRINGE | INTRAVENOUS | Status: DC | PRN
  Administered 2017-05-13 (×2): 80 ug via INTRAVENOUS

## 2017-05-13 MED ORDER — LIDOCAINE-EPINEPHRINE 2 %-1:100000 IJ SOLN
INTRAMUSCULAR | Status: DC | PRN
  Administered 2017-05-13: 10.2 mL via INTRADERMAL

## 2017-05-13 MED ORDER — AMINOCAPROIC ACID SOLUTION 5% (50 MG/ML)
10.0000 mL | ORAL | Status: DC
  Filled 2017-05-13: qty 100

## 2017-05-13 MED ORDER — LIDOCAINE 2% (20 MG/ML) 5 ML SYRINGE
INTRAMUSCULAR | Status: DC | PRN
  Administered 2017-05-13: 100 mg via INTRAVENOUS

## 2017-05-13 MED ORDER — DEXAMETHASONE SODIUM PHOSPHATE 10 MG/ML IJ SOLN
INTRAMUSCULAR | Status: AC
  Filled 2017-05-13: qty 1

## 2017-05-13 MED ORDER — PROMETHAZINE HCL 25 MG/ML IJ SOLN: 6.2500 mg | INTRAMUSCULAR | Status: DC | PRN

## 2017-05-13 MED ORDER — HYDRALAZINE HCL 20 MG/ML IJ SOLN
INTRAMUSCULAR | Status: AC
  Filled 2017-05-13: qty 1

## 2017-05-13 MED ORDER — HYDROCODONE-ACETAMINOPHEN 5-325 MG PO TABS
1.0000 | ORAL_TABLET | Freq: Once | ORAL | Status: AC
  Administered 2017-05-13: 1 via ORAL

## 2017-05-13 MED ORDER — LACTATED RINGERS IV SOLN
INTRAVENOUS | Status: DC | PRN
  Administered 2017-05-13: 07:00:00 via INTRAVENOUS

## 2017-05-13 MED ORDER — OXYMETAZOLINE HCL 0.05 % NA SOLN
NASAL | Status: DC | PRN
  Administered 2017-05-13 (×2): 2 via NASAL

## 2017-05-13 MED ORDER — MIDAZOLAM HCL 2 MG/2ML IJ SOLN
INTRAMUSCULAR | Status: AC
  Filled 2017-05-13: qty 2

## 2017-05-13 MED ORDER — SUGAMMADEX SODIUM 200 MG/2ML IV SOLN
INTRAVENOUS | Status: DC | PRN
  Administered 2017-05-13: 465.2 mg via INTRAVENOUS

## 2017-05-13 MED ORDER — BUPIVACAINE-EPINEPHRINE 0.5% -1:200000 IJ SOLN
INTRAMUSCULAR | Status: DC | PRN
  Administered 2017-05-13: 3.6 mL

## 2017-05-13 MED ORDER — LIDOCAINE-EPINEPHRINE 2 %-1:100000 IJ SOLN
INTRAMUSCULAR | Status: AC
  Filled 2017-05-13: qty 10.2

## 2017-05-13 MED ORDER — 0.9 % SODIUM CHLORIDE (POUR BTL) OPTIME
TOPICAL | Status: DC | PRN
  Administered 2017-05-13: 1000 mL

## 2017-05-13 MED ORDER — SUCCINYLCHOLINE CHLORIDE 200 MG/10ML IV SOSY
PREFILLED_SYRINGE | INTRAVENOUS | Status: DC | PRN
  Administered 2017-05-13: 120 mg via INTRAVENOUS

## 2017-05-13 MED ORDER — ONDANSETRON HCL 4 MG/2ML IJ SOLN
INTRAMUSCULAR | Status: DC | PRN
  Administered 2017-05-13: 4 mg via INTRAVENOUS

## 2017-05-13 MED ORDER — EPHEDRINE SULFATE-NACL 50-0.9 MG/10ML-% IV SOSY
PREFILLED_SYRINGE | INTRAVENOUS | Status: DC | PRN
  Administered 2017-05-13 (×2): 10 mg via INTRAVENOUS

## 2017-05-13 MED ORDER — MIDAZOLAM HCL 5 MG/5ML IJ SOLN
INTRAMUSCULAR | Status: DC | PRN
  Administered 2017-05-13: 2 mg via INTRAVENOUS

## 2017-05-13 MED ORDER — HYDRALAZINE HCL 20 MG/ML IJ SOLN
10.0000 mg | Freq: Once | INTRAMUSCULAR | Status: AC
  Administered 2017-05-13: 10 mg via INTRAVENOUS

## 2017-05-13 MED ORDER — OXYMETAZOLINE HCL 0.05 % NA SOLN
NASAL | Status: AC
  Filled 2017-05-13: qty 30

## 2017-05-13 MED ORDER — LIDOCAINE 2% (20 MG/ML) 5 ML SYRINGE
INTRAMUSCULAR | Status: AC
  Filled 2017-05-13: qty 5

## 2017-05-13 MED ORDER — FENTANYL CITRATE (PF) 250 MCG/5ML IJ SOLN
INTRAMUSCULAR | Status: DC | PRN
  Administered 2017-05-13 (×3): 50 ug via INTRAVENOUS
  Administered 2017-05-13: 100 ug via INTRAVENOUS

## 2017-05-13 MED FILL — HYDROCODON-APAP 5-325: 5-325 | 4 days supply | Qty: 32 | Fill #0

## 2017-05-13 SURGICAL SUPPLY — 37 items
ALCOHOL 70% 16 OZ (MISCELLANEOUS) ×3 IMPLANT
ATTRACTOMAT 16X20 MAGNETIC DRP (DRAPES) ×3 IMPLANT
BLADE SURG 15 STRL LF DISP TIS (BLADE) ×2 IMPLANT
BLADE SURG 15 STRL SS (BLADE) ×4
COVER SURGICAL LIGHT HANDLE (MISCELLANEOUS) ×3 IMPLANT
GAUZE PACKING FOLDED 2  STR (GAUZE/BANDAGES/DRESSINGS) ×2
GAUZE PACKING FOLDED 2 STR (GAUZE/BANDAGES/DRESSINGS) ×1 IMPLANT
GAUZE SPONGE 4X4 16PLY XRAY LF (GAUZE/BANDAGES/DRESSINGS) ×3 IMPLANT
GLOVE BIO SURGEON STRL SZ 6.5 (GLOVE) ×2 IMPLANT
GLOVE BIO SURGEONS STRL SZ 6.5 (GLOVE) ×1
GLOVE SURG ORTHO 8.0 STRL STRW (GLOVE) ×3 IMPLANT
GOWN STRL REUS W/ TWL LRG LVL3 (GOWN DISPOSABLE) ×1 IMPLANT
GOWN STRL REUS W/TWL 2XL LVL3 (GOWN DISPOSABLE) ×3 IMPLANT
GOWN STRL REUS W/TWL LRG LVL3 (GOWN DISPOSABLE) ×2
HEMOSTAT SURGICEL 2X14 (HEMOSTASIS) ×3 IMPLANT
KIT BASIN OR (CUSTOM PROCEDURE TRAY) ×3 IMPLANT
KIT TURNOVER KIT B (KITS) ×3 IMPLANT
MANIFOLD NEPTUNE WASTE (CANNULA) ×3 IMPLANT
NEEDLE BLUNT 16X1.5 OR ONLY (NEEDLE) ×3 IMPLANT
NEEDLE DENTAL 27 LONG (NEEDLE) ×6 IMPLANT
NS IRRIG 1000ML POUR BTL (IV SOLUTION) ×3 IMPLANT
PACK EENT II TURBAN DRAPE (CUSTOM PROCEDURE TRAY) ×3 IMPLANT
PAD ARMBOARD 7.5X6 YLW CONV (MISCELLANEOUS) ×3 IMPLANT
SPONGE SURGIFOAM ABS GEL 100 (HEMOSTASIS) IMPLANT
SPONGE SURGIFOAM ABS GEL 12-7 (HEMOSTASIS) IMPLANT
SPONGE SURGIFOAM ABS GEL SZ50 (HEMOSTASIS) ×3 IMPLANT
SUCTION FRAZIER HANDLE 10FR (MISCELLANEOUS) ×2
SUCTION TUBE FRAZIER 10FR DISP (MISCELLANEOUS) ×1 IMPLANT
SUT CHROMIC 3 0 PS 2 (SUTURE) ×6 IMPLANT
SUT CHROMIC 4 0 P 3 18 (SUTURE) IMPLANT
SYR 50ML SLIP (SYRINGE) ×3 IMPLANT
TOWEL NATURAL 10PK STERILE (DISPOSABLE) ×3 IMPLANT
TUBE CONNECTING 12'X1/4 (SUCTIONS) ×1
TUBE CONNECTING 12X1/4 (SUCTIONS) ×2 IMPLANT
WATER STERILE IRR 1000ML POUR (IV SOLUTION) ×3 IMPLANT
WATER TABLETS ICX (MISCELLANEOUS) ×3 IMPLANT
YANKAUER SUCT BULB TIP NO VENT (SUCTIONS) ×3 IMPLANT

## 2017-05-13 NOTE — H&P (Signed)
05/13/2017  Patient:            Joshua May Date of Birth:  [birth year] MRN:                [id number]   BP 117/72   Pulse 75   Temp 97.9 F (36.6 C) (Oral)   Resp 17   Ht 5\' 11"  (1.803 m)   Wt 256 lb 6 oz (116.3 kg)   SpO2 99%   BMI 35.76 kg/m    Joshua May is a 42 year old male with severe mitral regurgitation with anticipated mitral valve replacement.  Patient presents for extraction of indicated teeth with alveoloplasty and gross dobutamine dentition in the operating room and general anesthesia.  The patient denies any acute medical or dental changes.  Patient agrees to treatment as planned.  Please see note from Dr. Cornelius Moras dated 04/28/2017 to act as May&P for dental operating room procedure.  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  The patient was hospitalized in February of this year with acute exacerbation of chronic systolic congestive heart failure.  He was also diagnosed with small pulmonary embolus in the right lower lobe on CT angiogram, although imaging suggested that the pulmonary embolus was probably old.  I had the opportunity to see the patient in consultation at that time (full consultation note dated March 04, 2017).  His heart failure medications were optimized and since then the patient has been followed carefully in the advanced heart failure clinic.  He was seen in follow-up  recently by Dr. Shirlee Latch.  He is taking all medications as directed and clinically remained stable, although he still reports significant exertional shortness of breath.  He subsequently underwent TEE on April 19, 2017.  TEE revealed mildly dilated left ventricle with ejection fraction estimated 35-40% in the setting of severe mitral regurgitation.  There was inferolateral akinesis.  There was severe mitral regurgitation with ER O measured 0.43 cm.  Functional anatomy appeared consistent with classical type IIIb systolic restriction caused by infarct related mitral regurgitation with tethering of the posterior leaflet.  There was no obvious reversal of flow in the pulmonary veins but there was severe systolic flattening.  There was a small patent foramen ovale appreciated.  The patient was referred for surgical follow-up.  The patient is married and lives locally in [place] with his wife and 2 teenage children. He is currently out of work, having previously worked in the [employer] for [employer]. He states that he has been limited by exertional shortness of breath dating back to October 2017. Symptoms have become considerably worse over the last few months, culminating his admission last February.  Since hospital discharge he has been clinically stable but he still gets short of breath with moderate activity of any kind.  He continues to have a productive cough with blood-tinged sputum that is worse in the mornings.   He reports some tightness in his chest with exertion but he states this is quite mild and not like his  previous heart attacks.  He has had some wheezing that he has attributed to asthma.          Current Outpatient Medications  Medication Sig Dispense Refill  . atorvastatin (LIPITOR) 80 MG tablet Take 1 tablet (80 mg total) by mouth daily. 30 tablet 2  . carvedilol (COREG) 3.125 MG tablet Take 1 tablet (3.125 mg total) by mouth 2 (two) times daily with a meal. 60  tablet 2  . furosemide (LASIX) 40 MG tablet Take 1 tablet (40 mg total) by mouth daily. 30 tablet 11  . losartan (COZAAR) 25 MG tablet Take 1 tablet (25 mg total) by mouth daily. 30 tablet 2  . nitroGLYCERIN (NITROSTAT) 0.4 MG SL tablet Place 1 tablet (0.4 mg total) under the tongue every 5 (five) minutes as needed for chest pain. 25 tablet 2  . rivaroxaban (XARELTO) 20 MG TABS tablet Take 1 tablet (20 mg total) by mouth at bedtime. 30 tablet 2  . spironolactone (ALDACTONE) 25 MG tablet Take 1 tablet (25 mg total) by mouth daily. 30 tablet 2   No current facility-administered medications for this visit.                  Physical Exam:              BP 97/65   Pulse 90   Resp 20   Ht 5\' 11"  (1.803 m)   Wt 249 lb (112.9 kg)   SpO2 97% Comment: RA  BMI 34.73 kg/m              General:                      Well appearing             Chest:                          clear             CV:                              RRR w/out murmur             Incisions:                     n/a             Abdomen:                    soft             Extremities:                 Warm, no edema  Diagnostic Tests:  Transesophageal Echocardiography  Patient: Joshua May, Joshua May MR #: [id number] Study Date: 04/19/2017 Gender: M Age: 27 Height: 180.3 cm Weight: 109.3 kg BSA: 2.37 m^2 Pt. Status: Room:  ADMITTING Marca Anconaalton McLean, M.D. ATTENDING Marca Anconaalton McLean, M.D. ORDERING Marca Anconaalton McLean, M.D. PERFORMING Marca Anconaalton McLean, M.D. REFERRING Marca Anconaalton McLean, M.D. SONOGRAPHER Thurman Coyerasey Kirkpatrick  cc:  -------------------------------------------------------------------  ------------------------------------------------------------------- Indications: Mitral regurgitation 424.0.  ------------------------------------------------------------------- History: PMH: Coronary artery disease. Congestive heart failure. PMH: Myocardial  infarction. Pulmonary embolus. Risk factors: Current tobacco use. Dyslipidemia.  ------------------------------------------------------------------- Study Conclusions  - Left ventricle: Mildly dilated left ventricle with EF 35-40%. Basal to mid inferior and inferolateral akinesis. - Aortic valve: There was no stenosis. - Aorta: Normal  caliber thoracic aorta with minimal plaque. - Mitral valve: There was severe mitral regurgitation with PISA ERO 0.43 cm^2. Suspect infarct-related MR with tethering of the posterior leaflet. There was flattening but not frank reversal of the pulmonary vein doppler pattern. - Left atrium: The atrium was mildly dilated. No evidence of thrombus in the atrial cavity or appendage. - Right ventricle: The cavity size was normal. Systolic function was normal. - Right atrium: No evidence of thrombus in the atrial cavity or appendage. - Atrial septum: There was a small PFO by color doppler but bubble study was negative. - Tricuspid valve: Peak RV-RA gradient (S): 22 mm Hg.  Impressions:  - Severe infarct-related MR. EF better than it was on last echo. I will review with cardiac surgeon. As he still is symptomatic with exertional dyspnea, think he may benefit from MV repair.  ------------------------------------------------------------------- Study data: Study status: Routine. Consent: The risks, benefits, and alternatives to the procedure were explained to the patient and informed consent was obtained. Procedure: The patient reported no pain pre or post test. Initial setup. The patient was brought to the laboratory. Surface ECG leads were monitored. Sedation. Conscious sedation was administered by cardiology staff. Transesophageal echocardiography. Topical anesthesia was obtained using viscous lidocaine. A transesophageal probe was inserted by the attending cardiologist. 3D image quality was good. Intravenous contrast  (agitated saline) was administered. Study completion: The patient tolerated the procedure well. There were no complications. Administered medications: Fentanyl, . Midazolam, 4mg . Diagnostic transesophageal echocardiography. 2D and color Doppler. Birthdate: Patient birthdate: 1975-12-22. Age: Patient is 42 yr old. Sex: Gender: male. BMI: 33.6 kg/m^2. Blood pressure: 109/50 Patient status: Outpatient. Study date: Study date: 04/19/2017. Study time: 10:00 AM. Location: Endoscopy.  -------------------------------------------------------------------  ------------------------------------------------------------------- Left ventricle: Mildly dilated left ventricle with EF 35-40%. Basal to mid inferior and inferolateral akinesis. Wall thickness was normal.  ------------------------------------------------------------------- Aortic valve: Trileaflet. Doppler: There was no stenosis. There was no regurgitation.  ------------------------------------------------------------------- Aorta: Normal caliber thoracic aorta with minimal plaque.  ------------------------------------------------------------------- Mitral valve: There was severe mitral regurgitation with PISA ERO 0.43 cm^2. Suspect infarct-related MR with tethering of the posterior leaflet. There was flattening but not frank reversal of the pulmonary vein doppler pattern. Doppler: There was no evidence for stenosis.  ------------------------------------------------------------------- Left atrium: The atrium was mildly dilated. No evidence of thrombus in the atrial cavity or appendage.  ------------------------------------------------------------------- Atrial septum: There was a small PFO by color doppler but bubble study was negative.  ------------------------------------------------------------------- Right ventricle: The cavity size was normal. Systolic function  was normal.  ------------------------------------------------------------------- Pulmonic valve: Doppler: There was trivial regurgitation.  ------------------------------------------------------------------- Tricuspid valve: Doppler: There was trivial regurgitation.  ------------------------------------------------------------------- Right atrium: The atrium was normal in size. No evidence of thrombus in the atrial cavity or appendage.  ------------------------------------------------------------------- Pericardium: There was no pericardial effusion.  ------------------------------------------------------------------- Post procedure conclusions Ascending Aorta:  - Normal caliber thoracic aorta with minimal plaque.  ------------------------------------------------------------------- Measurements  Mitral valve Value Mitral regurg VTI, PISA 179 cm Mitral ERO, PISA 0.43 cm^2 Mitral regurg volume, PISA 77 ml  Tricuspid valve Value Tricuspid regurg peak velocity 233 cm/s Tricuspid peak RV-RA gradient 22 mm Hg  Legend: (L) and (May) mark values outside specified reference range.  ------------------------------------------------------------------- Prepared and Electronically Authenticated by  Marca Ancona, M.D. 2019-04-02T08:40:11   Impression:  Patient has long-standing history of premature coronary artery disease with multiple previous myocardial infarctions and ischemic cardiomyopathy with chronic systolic congestive heart failure.  He has stage D severe symptomatic secondary mitral regurgitation with significant persistent symptoms  of exertional shortness of breath despite optimal medical therapy.  I have personally reviewed the patient's recent transesophageal echocardiogram which reveals classical type IIIb mitral valve  dysfunction.  I agree the patient might benefit from mitral valve repair.  Patient might be a reasonable candidate for minimally invasive approach for surgery, although it remains unclear to me whether or not the right coronary artery stenosis identified at the time of catheterization last February his flow-limiting.   Plan:  The patient and his wife were counseled at length regarding the indications, risks and potential benefits of mitral valve repair.  The rationale for elective surgery has been explained, including a comparison between surgery and continued medical therapy with close follow-up.  Alternative surgical approaches have been discussed including a comparison between conventional sternotomy and minimally-invasive techniques.  The patient hopes to avoid conventional median sternotomy if possible.  Under the circumstances we would not plan elective surgical revascularization.  I think this is reasonable, although it remains possible that the patient's right coronary artery disease may become clinically significant at some point in the not too distant future.  Risks associated with surgery will be acceptably low although somewhat elevated due to the severity of the patient's underlying left ventricular systolic dysfunction as well as his history of previous pulmonary embolus and long-term anticoagulation.  For family reasons the patient does not wish to proceed with surgery until at least the middle of June.  We have not recommended any changes to his current medications.  He will be referred for routine dental service consultation because he has not seen a dentist in many years.  We will obtain CT angiogram of the aorta and iliac vessels to evaluate the feasibility of peripheral cannulation for surgery.  The patient will return to our office for follow-up on June 21, 2017.  We will tentatively plan to make final arrangements for surgery at that time.  I spent in excess of 45 minutes during  the conduct of this office consultation and >50% of this time involved direct face-to-face encounter with the patient for counseling and/or coordination of their care.    Salvatore Decent. Cornelius Moras, MD 04/28/2017 12:53 PM             Electronically signed by Purcell Nails, MD at 04/28/2017 1:11 PM

## 2017-05-13 NOTE — Progress Notes (Signed)
PRE-OPERATIVE NOTE:  05/13/2017 Joshua May [id number]  VITALS: BP 117/72   Pulse 75   Temp 97.9 F (36.6 C) (Oral)   Resp 17   Ht 5\' 11"  (1.803 m)   Wt 256 lb 6 oz (116.3 kg)   SpO2 99%   BMI 35.76 kg/m   Lab Results  Component Value Date   WBC 7.8 05/05/2017   HGB 12.6 (L) 05/05/2017   HCT 38.5 (L) 05/05/2017   MCV 90.8 05/05/2017   PLT 298 05/05/2017   BMET    Component Value Date/Time   NA 137 05/05/2017 0943   K 4.2 05/05/2017 0943   CL 105 05/05/2017 0943   CO2 24 05/05/2017 0943   GLUCOSE 113 (H) 05/05/2017 0943   BUN 14 05/05/2017 0943   CREATININE 1.05 05/05/2017 0943   CREATININE 1.22 11/01/2015 1213   CALCIUM 9.0 05/05/2017 0943   GFRNONAA >60 05/05/2017 0943   GFRAA >60 05/05/2017 0943    Lab Results  Component Value Date   INR 1.02 05/13/2017   INR 1.31 03/03/2017   INR 0.99 10/20/2015   No results found for: PTT   Joshua May presents for extraction of indicated teeth with alveoloplasty and gross debridement of remaining dentition in the operating room.   SUBJECTIVE: The patient denies any acute medical or dental changes and agrees to proceed with treatment as planned.  EXAM: No sign of acute dental changes.  ASSESSMENT: Patient is affected by chronic apical periodontitis, chronic periodontitis, Accretions, retained root segments, and dental caries.  PLAN: Patient agrees to proceed with treatment as planned in the operating room as previously discussed and accepts the risks, benefits, and complications of the proposed treatment. Patient is aware of the risk for bleeding, bruising, swelling, infection, pain, nerve damage, soft tissue damage, damage to adjacent teeth, sinus involvement, root tip fracture, mandible fracture, and the risks of complications associated with the anesthesia. Patient also is aware of the potential for other complications up to and including death with anticipated invasive dental procedure due to his overall  cardiovascular compromise.   Charlynne Panderonald F. Kulinski, DDS

## 2017-05-13 NOTE — Transfer of Care (Signed)
Immediate Anesthesia Transfer of Care Note  Patient: Joshua May  Procedure(s) Performed: Extraction of tooth #'s [phone],15,16,17 with alveoloplasty and gross debridement of remaining teeth. (N/A Mouth)  Patient Location: PACU  Anesthesia Type:General  Level of Consciousness: awake, drowsy and patient cooperative  Airway & Oxygen Therapy: Patient Spontanous Breathing and Patient connected to face mask oxygen  Post-op Assessment: Report given to RN and Post -op Vital signs reviewed and stable  Post vital signs: Reviewed and stable  Last Vitals:  Vitals Value Taken Time  BP 149/88 05/13/2017  9:24 AM  Temp    Pulse 92 05/13/2017  9:25 AM  Resp 20 05/13/2017  9:25 AM  SpO2 100 % 05/13/2017  9:25 AM  Vitals shown include unvalidated device data.  Last Pain:  Vitals:   05/13/17 0557  TempSrc: Oral  PainSc:          Complications: No apparent anesthesia complications and Patient re-intubated

## 2017-05-13 NOTE — Discharge Instructions (Signed)

## 2017-05-13 NOTE — Anesthesia Procedure Notes (Signed)
Procedure Name: Intubation Date/Time: 05/13/2017 7:42 AM Performed by: Adria Dillonkin, Akhilesh Sassone A, CRNA Pre-anesthesia Checklist: Patient identified, Emergency Drugs available, Suction available and Patient being monitored Patient Re-evaluated:Patient Re-evaluated prior to induction Oxygen Delivery Method: Circle system utilized Preoxygenation: Pre-oxygenation with 100% oxygen Induction Type: IV induction Ventilation: Mask ventilation without difficulty Laryngoscope Size: Miller and 3 Grade View: Grade II Nasal Tubes: Right, Nasal prep performed, Nasal Rae and Magill forceps- large, utilized Tube size: 7.5 mm Number of attempts: 1 Placement Confirmation: ETT inserted through vocal cords under direct vision,  positive ETCO2,  CO2 detector and breath sounds checked- equal and bilateral Tube secured with: Tape Dental Injury: Teeth and Oropharynx as per pre-operative assessment  Comments: Afrin to bilateral nares preop. PreO2 x3 mins. Smooth IV induction, EZ mask. 7.5 NRT passed through right nare with ease. DLx1, Mil 3, Grade 2 view with CP. NRT tip clear of tissue. Minimal blood noted in airway. NRT tip assisted to pass through glottic opening with large Magill forceps. Atraumatic intubation. +ETCO2, =BBS. VSS with ephedrine and neo IV.

## 2017-05-13 NOTE — Anesthesia Preprocedure Evaluation (Addendum)
Anesthesia Evaluation  Patient identified by MRN, date of birth, ID band Patient awake    Reviewed: Allergy & Precautions, NPO status , Patient's Chart, lab work & pertinent test results, reviewed documented beta blocker date and time   Airway Mallampati: II  TM Distance: >3 FB Neck ROM: Full    Dental  (+) Teeth Intact, Dental Advisory Given   Pulmonary asthma , sleep apnea , former smoker, PE   Pulmonary exam normal breath sounds clear to auscultation       Cardiovascular hypertension, Pt. on home beta blockers and Pt. on medications + CAD, + Past MI, + Cardiac Stents (RCA, LCx) and +CHF  + Valvular Problems/Murmurs MR  Rhythm:Regular Rate:Normal + Systolic murmurs CAD (PCI 01/18/06, [place]; inferior STEMI (occluded RCA) s/p BMS RCA 03/31/08; inferolateral STEMI (occluded CX) s/p DES CX, POBA OM3 10/20/15; proximal-mid CX stent widely patent, proximal CX stent 45% stenosis with FFR 0.88, proximal-mid RCA stent 55% stenosis, LVEDP 30-3731mmHg, medical Rx for CAD, refer fo possible MV repair 03/03/17), severe mitral regurgitation (suspect infarct related MR with tethering of the posterior leaflet), chronic systolic CHF (EF 16-10%25-30% 02/2017, 35-40% 04/2017), PE (diagnosed 02/27/17, but mostly resolved at that time by CTA; unlcear etiology, + family history, + lupus anticoagulant but on Xarelto so could be false positive, life long anticoagulation recommended).   Echo 04/19/17: Study Conclusions - Left ventricle: Mildly dilated left ventricle with EF 35-40%. Basal to mid inferior and inferolateral akinesis. - Aortic valve: There was no stenosis. - Aorta: Normal caliber thoracic aorta with minimal plaque. - Mitral valve: There was severe mitral regurgitation with PISA ERO0.43 cm^2. Suspect infarct-related MR with tethering of theposterior leaflet. There was flattening but not frank reversal ofthe pulmonary vein doppler pattern. - Left atrium: The atrium  was mildly dilated. No evidence ofthrombus in the atrial cavity or appendage. - Right ventricle: The cavity size was normal. Systolic functionwas normal. - Right atrium: No evidence of thrombus in the atrial cavity orappendage. - Atrial septum: There was a small PFO by color doppler but bubble study was negative. - Tricuspid valve: Peak RV-RA gradient (S): 22 mm Hg. Impressions: - Severe infarct-related MR. EF better than it was on last echo. Iwill review with cardiac surgeon. As he still is symptomatic withexertional dyspnea, think he may benefit from MV repair. (Comparison echo 03/01/17: EF 25-30%, diffuse hypokinesis with inferolateral akinesis, moderate-severe MR, peak RV-RA gradient 50 mmHg, PA peak pressure 58 mmHg.)     Neuro/Psych negative neurological ROS  negative psych ROS   GI/Hepatic negative GI ROS, Neg liver ROS,   Endo/Other  negative endocrine ROS  Renal/GU negative Renal ROS     Musculoskeletal negative musculoskeletal ROS (+)   Abdominal   Peds  Hematology  (+) Blood dyscrasia (Xarelto), anemia ,   Anesthesia Other Findings Day of surgery medications reviewed with the patient.  Reproductive/Obstetrics                            Anesthesia Physical Anesthesia Plan  ASA: IV  Anesthesia Plan: General   Post-op Pain Management:    Induction: Intravenous  PONV Risk Score and Plan: 2 and Dexamethasone, Ondansetron and Midazolam  Airway Management Planned: Nasal ETT  Additional Equipment: Arterial line  Intra-op Plan:   Post-operative Plan: Extubation in OR  Informed Consent: I have reviewed the patients History and Physical, chart, labs and discussed the procedure including the risks, benefits and alternatives for the proposed anesthesia  with the patient or authorized representative who has indicated his/her understanding and acceptance.   Dental advisory given  Plan Discussed with: CRNA  Anesthesia Plan Comments:        Anesthesia Quick Evaluation

## 2017-05-13 NOTE — Op Note (Signed)
OPERATIVE REPORT  Patient:            Joshua May Date of Birth:  [birth year] MRN:                [id number]   DATE OF PROCEDURE:  05/13/2017  PREOPERATIVE DIAGNOSES: 1.  Severe mitral regurgitation 2.  Pre-heart valve surgery dental protocol 3.  Chronic apical periodontitis 4.  Dental caries 5.  Retained root segments 6.  Chronic periodontitis 7.  Accretions  POSTOPERATIVE DIAGNOSES: 1.  Severe mitral regurgitation 2.  Pre-heart valve surgery dental protocol 3.  Chronic apical periodontitis 4.  Dental caries 5.  Retained root segments 6.  Chronic periodontitis 7.  Accretions  OPERATIONS: 1. Multiple extraction of tooth numbers 2, 15, 16, 17 2.  2 quadrants of alveoloplasty 3. Gross debridement of remaining dentition   SURGEON: Charlynne Pander, DDS  ASSISTANT: Pearletha Alfred (dental assistant)  ANESTHESIA: General anesthesia via nasoendotracheal tube.  MEDICATIONS: 1. Ancef 2 g IV prior to invasive dental procedures. 2. Local anesthesia with a total utilization of 3 carpules each containing 34 mg of lidocaine with 0.017 mg of epinephrine as well as 1 carpules each containing 9 mg of bupivacaine with 0.009 mg of epinephrine.  SPECIMENS: There are 4 teeth that were discarded.  DRAINS: None  CULTURES: None  COMPLICATIONS: None  ESTIMATED BLOOD LOSS: 100 mLs.  INTRAVENOUS FLUIDS: 900 mLs of Lactated ringers solution.  INDICATIONS: The patient was recently diagnosed with severe mitral regurgitation.  A medically necessary dental consultation was then requested to evaluate poor dentition.  The patient was examined and treatment planned for extraction of indicated teeth with alveoloplasty and gross debridement of remaining dentition in the operating room with general anesthesia.  This treatment plan was formulated to decrease the risks and complications associated with dental infection from affecting the patient's systemic health and anticipated heart valve  surgery.  OPERATIVE FINDINGS: Patient was examined operating room number 10.  The teeth were identified for extraction.  Retained roots in the area of tooth numbers 2 and 17 were identified for extraction previously.  Patient recently experienced a fracture of the distal buccal cusp of tooth #15.  Extensive caries were noted making this tooth nonrestorable.  Tooth #16 also with tooth mobility and more extensive periodontal disease and is now unopposed with the impending extraction tooth #17.   Therefore, tooth #15 and 16 were added to the treatment plan for extraction today.  The patient was noted be affected by chronic apical periodontitis, dental caries, chronic periodontitis, accretions, retained root segments, and tooth mobility.     DESCRIPTION OF PROCEDURE: Patient was brought to the main operating room number 10. Patient was then placed in the supine position on the operating table. General anesthesia was then induced per the anesthesia team. The patient was then prepped and draped in the usual manner for dental medicine procedure. A timeout was performed. The patient was identified and procedures were verified. A throat pack was placed at this time. The oral cavity was then thoroughly examined with the findings noted above. The patient was then ready for dental medicine procedure as follows:  Local anesthesia was then administered sequentially with a total utilization of 3 carpules each containing 34 mg of lidocaine with 0.017 mg of epinephrine as well as 1 carpules  each containing 9 mg bupivacaine with 0.009 mg of epinephrine.  The Maxillary left and right quadrants first approached. Anesthesia was then delivered utilizing infiltration with lidocaine with epinephrine.  Retained  root in the area of tooth #2 was then removed with a ronguers without complications.  The socket was curetted and compressed appropriately.  No suture was required.    A #15 blade incision was then made from the  maxillary left tuberosity and extended the mesial #14.  A  surgical flap was then carefully reflected.  Tooth numbers 15 and 16 were then subluxated with a series of straight elevators. Tooth numbers 15 and 16 were then extracted with a 53L forceps.  The distobuccal root of tooth #15 was still present.  A surgical handpiece and bur with copious amounts sterile water were then used to remove buccal bone around the remaining root segment.  The root was then removed with a rongeurs without complications.  The extraction site #16 was then evaluated for possible small (less than 1 mm) retained root tips.  The root tips were not visualized and therefore no additional surgery would be performed at this time.  Patient will be informed of the possible presence of small retained root tips and a postoperative radiograph will be obtained at his postop visit.  Alveoloplasty was then performed utilizing a ronguers and bone file. The surgical site was then irrigated with copious amounts of sterile saline. The tissues were approximated and trimmed appropriately.  A piece of Surgicel was placed the extraction sockets appropriately.  The surgical site was then closed from the maxillary tuberosity extending the distal #14 utilizing 3-0 chromic gut suture in a continuous interrupted suture technique x1.    At this point time, the mandibular quadrants were approached. The patient was given a left inferior alveolar nerve blocks and long buccal nerve blocks utilizing the bupivacaine with epinephrine. Further infiltration was then achieved utilizing the lidocaine with epinephrine. A 15 blade incision was then made from the distal of number 17 extended to the mesial #18.  A surgical flap was then carefully reflected. Tooth number 17 was then removed with a 151 forceps without complications. Alveoloplasty was then performed utilizing a rongeurs and bone file to help achieve primary closure. The tissues were approximated and trimmed  appropriately. The surgical sites were then irrigated with copious amounts of sterile saline.  A piece of Surgicel and Surgifoam were placed the extraction sockets appropriately.  The surgical site was then closed from the distal #17 extended to the distal #18 utilizing 3-0 chromic gut suture in a continuous interrupted suture technique x1.    At this point time, the remaining dentition was approached.  A Cavitron ultrasonic scaler was used to remove significant accretions.  A series of hand curettes were then used to further remove accretions.  The Cavitron was then again used to further refine the removal of accretions as needed.  This completed the gross debridement procedure.  Patient will require additional periodontal therapy in the future with the general dentist or periodontist of his choice once he is medically stable from the anticipated heart valve surgery.  .  At this point time, the entire mouth was irrigated with copious amounts of sterile saline. The patient was examined for complications, seeing none, the dental medicine procedure was deemed to be complete. The throat pack was removed at this time. An oral airway was then placed at the request of the anesthesia team. A series of 4 x 4 gauze moistened with Amicar 5% rinse were placed in the mouth to aid hemostasis. The patient was then handed over to the anesthesia team for final disposition. After an appropriate amount of time, the patient  was extubated and taken to the postanesthsia care unit in good condition. All counts were correct for the dental medicine procedure.  The patient is to continue the Amicar 5% rinses postoperatively.  Patient is to rinse with 10 mL's every hour for the next 10 hours and a swish and spit manner.  The Xarelto therapy will be restarted tomorrow evening if there is no significant bleeding present.  Follow-up for suture removal in 7 to 10 days.     Charlynne Pander, DDS.

## 2017-05-14 ENCOUNTER — Encounter (HOSPITAL_COMMUNITY): Payer: Self-pay | Admitting: Dentistry

## 2017-05-14 NOTE — Anesthesia Postprocedure Evaluation (Signed)
Anesthesia Post Note  Patient: Joshua May  Procedure(s) Performed: Extraction of tooth #'s [phone],15,16,17 with alveoloplasty and gross debridement of remaining teeth. (N/A Mouth)     Patient location during evaluation: PACU Anesthesia Type: General Level of consciousness: awake and alert Pain management: pain level controlled Vital Signs Assessment: post-procedure vital signs reviewed and stable Respiratory status: spontaneous breathing, nonlabored ventilation, respiratory function stable and patient connected to nasal cannula oxygen Cardiovascular status: blood pressure returned to baseline and stable Postop Assessment: no apparent nausea or vomiting Anesthetic complications: no    Last Vitals:  Vitals:   05/13/17 1138 05/13/17 1158  BP: (!) 151/75 (!) 149/94  Pulse: 83 91  Resp: (!) 24 20  Temp: (!) 36.1 C (!) 36.4 C  SpO2: 94% 96%    Last Pain:  Vitals:   05/13/17 1158  TempSrc: Axillary  PainSc: 5                  Cecile HearingStephen Edward Turk

## 2017-05-19 ENCOUNTER — Ambulatory Visit (HOSPITAL_COMMUNITY)
Admission: RE | Admit: 2017-05-19 | Discharge: 2017-05-19 | Disposition: A | Discharge status: Home / Self Care | Payer: Self-pay | Source: Ambulatory Visit | Attending: Internal Medicine | Admitting: Internal Medicine

## 2017-05-19 ENCOUNTER — Encounter (HOSPITAL_COMMUNITY): Payer: Self-pay

## 2017-05-19 VITALS — BP 86/64 | HR 83 | Wt 250.0 lb

## 2017-05-19 DIAGNOSIS — Z7901 Long term (current) use of anticoagulants: Secondary | ICD-10-CM | POA: Insufficient documentation

## 2017-05-19 DIAGNOSIS — I252 Old myocardial infarction: Secondary | ICD-10-CM | POA: Insufficient documentation

## 2017-05-19 DIAGNOSIS — Z87891 Personal history of nicotine dependence: Secondary | ICD-10-CM | POA: Insufficient documentation

## 2017-05-19 DIAGNOSIS — I340 Nonrheumatic mitral (valve) insufficiency: Secondary | ICD-10-CM | POA: Insufficient documentation

## 2017-05-19 DIAGNOSIS — Z79899 Other long term (current) drug therapy: Secondary | ICD-10-CM | POA: Insufficient documentation

## 2017-05-19 DIAGNOSIS — I959 Hypotension, unspecified: Secondary | ICD-10-CM | POA: Insufficient documentation

## 2017-05-19 DIAGNOSIS — I5022 Chronic systolic (congestive) heart failure: Secondary | ICD-10-CM | POA: Insufficient documentation

## 2017-05-19 DIAGNOSIS — I25110 Atherosclerotic heart disease of native coronary artery with unstable angina pectoris: Secondary | ICD-10-CM

## 2017-05-19 DIAGNOSIS — I2510 Atherosclerotic heart disease of native coronary artery without angina pectoris: Secondary | ICD-10-CM | POA: Insufficient documentation

## 2017-05-19 DIAGNOSIS — G4733 Obstructive sleep apnea (adult) (pediatric): Secondary | ICD-10-CM | POA: Insufficient documentation

## 2017-05-19 DIAGNOSIS — F17200 Nicotine dependence, unspecified, uncomplicated: Secondary | ICD-10-CM

## 2017-05-19 LAB — BASIC METABOLIC PANEL
Anion gap: 9 (ref 5–15)
BUN: 11 mg/dL (ref 6–20)
CO2: 28 mmol/L (ref 22–32)
Calcium: 9.1 mg/dL (ref 8.9–10.3)
Chloride: 103 mmol/L (ref 101–111)
Creatinine, Ser: 1.03 mg/dL (ref 0.61–1.24)
GFR calc non Af Amer: >60 mL/min (ref >60)
Glucose, Bld: 95 mg/dL (ref 65–99)
Potassium: 3.6 mmol/L (ref 3.5–5.1)
SODIUM: 140 mmol/L (ref 135–145)

## 2017-05-19 MED ORDER — FUROSEMIDE 20 MG PO TABS: 20.0000 mg | ORAL_TABLET | Freq: Every day | ORAL | 3 refills | Status: DC

## 2017-05-19 NOTE — Patient Instructions (Signed)
Labs today (will call for abnormal results, otherwise no news is good news)  HOLD Lasix until Diet returns to normal, then decrease to 20 mg Once Daily.  Follow up in 4 weeks.

## 2017-05-19 NOTE — Progress Notes (Signed)
PCP: None  Primary Cardiologist: Dr Antoine Poche HF MD: Dr Shirlee Latch   HPI: Joshua May is a 42 y.o. Joshua May male with a history of CAD dating back to 2007 when he had a [place] in [place]. In 2010 he presented with a STEMI and had an RCA DES placed. He followed up for a year but then didn't present again till Oct 2017 when he presented with a CFX infarct. Cath revealed 50% ISR of the RCA, total mCFX and 85% OM3. He had CFX PCI with DES and OM3 POBA. His EF then was 50-55%.  Admitted 02/27/17 with increased dyspnea and chest pain. CTA confirmed PE. ECHO completed and showed reduced EF 20-25%. Underwent LHC as noted below. [place] no evidence DVTs. CT surgery consulted for severe MR. Plan to optimize HF medications.Set up TEE as an outpatient. Discharge weight 222 pounds.   He presents today for follow up. His weight is down 5 lbs.  He is not longer lightheaded or dizzy, but BP remains low. He states prior to his dental surgery BP was running in 120s. He has continued taking his diuretics post op, though his appetite and oral intake have decrease due to his oral pain. He denies CP or SOB. He sleeps in a recliner chronically. No fevers or chills. Remains fatigued. No near syncope. Taking all medications as directed.   TEE 04/19/17 LVEF 35-40%, Severe MR with PISA ERO 0.43 cm^2. Suspect infarct related MR with tethering of the posterior leaflet.   ECHO 03/01/2017  Mildly dilated LV with EF 25-30%, diffuse hypokinesis with   inferolateral akinesis. Restrictive diastolic function. RV poorly   visualized but appears normal in size with mildly decreased   systolic function. D-shaped interventricular septum is suggestive   of RV pressure/volume overload. Moderate pulmonary hypertension.   Moderate to severe mitral regurgitation, suspect infarct-related   MR with restricted posterior leaflet and akinetic inferolateral   wall.  LHC 03/03/2017   Previously placed Prox Cx to Mid Cx stent (unknown type) is widely  patent.  Prox Cx lesion is 45% stenosed. - proximal to the Stent -- FFR 0.88.  Prox RCA to Mid RCA stent is 55% stenosed.  Dist LAD lesion is 20% stenosed.  LV end diastolic pressure is severely elevated. 30-31 mmHg  Review of systems complete and found to be negative unless listed in HPI.    SH:  Social History   Socioeconomic History  . Marital status: Married    Spouse name: Not on file  . Number of children: Not on file  . Years of education: Not on file  . Highest education level: Not on file  Occupational History  . Not on file  Social Needs  . Financial resource strain: Not on file  . Food insecurity:    Worry: Not on file    Inability: Not on file  . Transportation needs:    Medical: Not on file    Non-medical: Not on file  Tobacco Use  . Smoking status: Former Smoker    Packs/day: 0.50    Years: 17.00    Pack years: 8.50    Types: Cigarettes  . Smokeless tobacco: Never Used  . Tobacco comment: quit since heart attack in 10/2015  Substance and Sexual Activity  . Alcohol use: Not Currently  . Drug use: Not Currently    Types: Marijuana  . Sexual activity: Not on file  Lifestyle  . Physical activity:    Days per week: Not on file    Minutes  per session: Not on file  . Stress: Not on file  Relationships  . Social connections:    Talks on phone: Not on file    Gets together: Not on file    Attends religious service: Not on file    Active member of club or organization: Not on file    Attends meetings of clubs or organizations: Not on file    Relationship status: Not on file  . Intimate partner violence:    Fear of current or ex partner: Not on file    Emotionally abused: Not on file    Physically abused: Not on file    Forced sexual activity: Not on file  Other Topics Concern  . Not on file  Social History Narrative   The patient is married with two children.    FH:  Family History  Problem Relation Age of Onset  . Other Sister 72       THE  PATIENT REPORTS A SISTER WITH A MYOCARDIAL INFARCTION IN HER 30s.(SHE IS NOW S/P CABG)  . Heart attack Father   . Deep vein thrombosis Father     Past Medical History:  Diagnosis Date  . Asthma   . CAD (coronary artery disease)   . Chronic systolic CHF (congestive heart failure) (HCC)   . HLD (hyperlipidemia)    Qualifier: Diagnosis of  By: Antoine Poche, MD, Gerrit Heck    . Hypercholesteremia   . Inferior myocardial infarction [facility]) 2007   PCI of RCA  . Inferior myocardial infarction (HCC) 03/31/2008   PCI of RCA with bare metal stent  . Ischemic cardiomyopathy   . Mitral regurgitation   . Obstructive sleep apnea    Qualifier: Diagnosis of  By: Antoine Poche, MD, Gerrit Heck    . 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 Pulmonary embolism (HCC) 02/28/2017  . Tobacco abuse     Current Outpatient Medications  Medication Sig Dispense Refill  . atorvastatin (LIPITOR) 80 MG tablet Take 1 tablet (80 mg total) by mouth daily. 30 tablet 2  . BLACK CURRANT SEED OIL PO Take 2.5 mLs by mouth daily.    . carvedilol (COREG) 3.125 MG tablet Take 1 tablet (3.125 mg total) by mouth 2 (two) times daily with a meal. 60 tablet 2  . furosemide (LASIX) 40 MG tablet Take 1 tablet (40 mg total) by mouth daily. 30 tablet 2  . HYDROcodone-acetaminophen (NORCO) 5-325 MG tablet Take 1-2 tablets by mouth every 6 (six) hours as needed for moderate pain or severe pain. 32 tablet 0  . losartan (COZAAR) 25 MG tablet Take 0.5 tablets (12.5 mg total) by mouth daily. 15 tablet 2  . nitroGLYCERIN (NITROSTAT) 0.4 MG SL tablet Place 1 tablet (0.4 mg total) under the tongue every 5 (five) minutes as needed for chest pain. (Patient not taking: Reported on 05/05/2017) 25 tablet 2  . rivaroxaban (XARELTO) 20 MG TABS tablet Take 1 tablet (20 mg total) by mouth at bedtime. 30 tablet 2  . spironolactone (ALDACTONE) 25 MG tablet Take 0.5 tablets (12.5 mg total) by mouth daily. 30 tablet 2   No  current facility-administered medications for this encounter.     Vitals:   05/19/17 1125  BP: (!) 86/64  Pulse: 83  SpO2: 100%  Weight: 250 lb (113.4 kg)    Wt Readings from Last 3 Encounters:  05/19/17 250 lb (113.4 kg)  05/13/17 256 lb 6 oz (116.3 kg)  05/07/17 256 lb 6  oz (116.3 kg)    PHYSICAL EXAM: General: Well appearing. No resp difficulty. HEENT: Normal Neck: Supple. JVP 5-6. Carotids 2+ bilat; no bruits. No thyromegaly or nodule noted. Cor: PMI nondisplaced. RRR, No M/G/R noted Lungs: CTAB, normal effort. Abdomen: Soft, non-tender, non-distended, no HSM. No bruits or masses. +BS  Extremities: No cyanosis, clubbing, or rash. R and LLE no edema.  Neuro: Alert & orientedx3, cranial nerves grossly intact. moves all 4 extremities w/o difficulty. Affect pleasant   ASSESSMENT & PLAN: 1. Chronic Systolic Heart Failure, ICM ECHO 02/2017 25-30%.  - NYHA II-III - Volume status dry on exam.  - Hold lasix until eating normally, then resume at 20 mg daily for now. Can take 20 mg as needed until resumes diet. BMET today.  - Continue coreg 3.125 mg BID.  - Continue Spironolactone 12.5 mg daily.  - Continue losartan 12.5 mg qhs.  - Reinforced fluid restriction to < 2 L daily, sodium restriction to less than 2000 mg daily, and the importance of daily weights.   2. CAD - Stents in LCX and RCA. LHC cath 02/2017 nonobstructive disease.  - No s/s of ischemia.    - Continue statin, bb.  - On xarelto so no aspirin.  3. PE - CTA 02/2017 with PE note.  - ? Etiology. Father has venous atheroembolism.  - + lupus anticoagulant but with xarelto false positive so no need for hematology consult. .  - Continue lifelong Xarelto.   - No change to current plan.   4. Mitral Regurgitation - TEE 04/19/17 - LVEF 35-40%, Severe MR with PISA ERO 0.43 cm^2. Suspect infarct related MR with tethering of the posterior leaflet.  - Has seen Dr. Cornelius Moras. Plan for MVR 07/07/17.   - Dental extractions completed  05/13/17. 5. Former Smoker - Encouraged continued abstinence.  6. Hypotension - Meds as above.  - Likely over diuresed in setting of decrease po intake. BMET today.   Labs and meds above. RTC 4 weeks.   Graciella Freer, PA-C  11:24 AM   Greater than 50% of the 25 minute visit was spent in counseling/coordination of care regarding disease state education, salt/fluid restriction, sliding scale diuretics, and medication compliance.

## 2017-05-21 ENCOUNTER — Encounter (HOSPITAL_COMMUNITY): Payer: Self-pay | Admitting: Dentistry

## 2017-05-21 ENCOUNTER — Ambulatory Visit (HOSPITAL_COMMUNITY): Payer: Medicaid Other | Admitting: Dentistry

## 2017-05-21 VITALS — BP 109/70 | HR 74 | Temp 98.6°F

## 2017-05-21 DIAGNOSIS — K08199 Complete loss of teeth due to other specified cause, unspecified class: Secondary | ICD-10-CM

## 2017-05-21 DIAGNOSIS — Z01818 Encounter for other preprocedural examination: Secondary | ICD-10-CM

## 2017-05-21 DIAGNOSIS — Z7901 Long term (current) use of anticoagulants: Secondary | ICD-10-CM

## 2017-05-21 DIAGNOSIS — K0530 Chronic periodontitis, unspecified: Secondary | ICD-10-CM

## 2017-05-21 DIAGNOSIS — I340 Nonrheumatic mitral (valve) insufficiency: Secondary | ICD-10-CM

## 2017-05-21 MED ORDER — CHLORHEXIDINE GLUCONATE 0.12 % MT SOLN: OROMUCOSAL | 99 refills | Status: DC

## 2017-05-21 NOTE — Progress Notes (Signed)
POST OPERATIVE NOTE:  05/21/2017 Joshua May [id number]  VITALS: BP 109/70 (BP Location: Left Arm)   Pulse 74   Temp 98.6 F (37 C)   LABS:  Lab Results  Component Value Date   WBC 7.8 05/05/2017   HGB 12.6 (L) 05/05/2017   HCT 38.5 (L) 05/05/2017   MCV 90.8 05/05/2017   PLT 298 05/05/2017   BMET    Component Value Date/Time   NA 140 05/19/2017 1150   K 3.6 05/19/2017 1150   CL 103 05/19/2017 1150   CO2 28 05/19/2017 1150   GLUCOSE 95 05/19/2017 1150   BUN 11 05/19/2017 1150   CREATININE 1.03 05/19/2017 1150   CREATININE 1.22 11/01/2015 1213   CALCIUM 9.1 05/19/2017 1150   GFRNONAA >60 05/19/2017 1150   GFRAA >60 05/19/2017 1150    Lab Results  Component Value Date   INR 1.02 05/13/2017   INR 1.31 03/03/2017   INR 0.99 10/20/2015   No results found for: PTT   Joshua May is status post multiple extractions with alveoloplasty and gross debridement of remaining teeth in the operating room on 05/13/2017.Patient now presents for evaluation of healing and suture removal as needed.  SUBJECTIVE: Patient denies having any significant postoperative discomfort. Patient denies having any bleeding.  EXAM: No sign of infection, heme, or ooze. Sutures are loosely intact. Patient is healing in by generalized primary closure with several areas healing in by secondary intention.  PROCEDURE: The patient was given a chlorhexidine gluconate rinse for 30 seconds. Sutures were then removed without complication. Patient tolerated the procedure well. Orthopantogram was taken to evaluate questionable presence of retained root tips in the area of #16.  None were noted.  ASSESSMENT: Post operative course is consistent with dental procedures performed in the operating with general anesthesia Loss of teeth due to extraction Chronic periodontitis   PLAN: 1. Rinse with chlorhexidine rinses twice daily as prescribed. 2. Use salt water rinses every 2 hours while awake in between  the chlorhexidine rinses 3. Advance diet as tolerated. 4. Follow-up with a new primary dentist for periodontal maintenance procedures and evaluation for other dental restoration needs once medically cleared from the anticipated heart valve surgery. 5. Patient is currently cleared for heart valve surgery with Dr. Cornelius Moras.   Charlynne Pander, DDS

## 2017-05-21 NOTE — Patient Instructions (Signed)
PLAN: 1. Rinse with chlorhexidine rinses twice daily as prescribed. 2. Use salt water rinses every 2 hours while awake in between the chlorhexidine rinses 3. Advance diet as tolerated. 4. Follow-up with a new primary dentist for periodontal maintenance procedures and evaluation for other dental restoration needs once medically cleared from the anticipated heart valve surgery. 5. Patient is currently cleared for heart valve surgery with Dr. Cornelius Moras.  Dr. Kristin Bruins

## 2017-05-24 ENCOUNTER — Ambulatory Visit (HOSPITAL_COMMUNITY): Payer: Self-pay | Admitting: Dentistry

## 2017-06-03 ENCOUNTER — Other Ambulatory Visit (HOSPITAL_COMMUNITY): Payer: Medicaid Other

## 2017-06-09 MED FILL — XARELTO 20 MG TABLET: 20 | 30 days supply | Qty: 30 | Fill #1

## 2017-06-09 MED FILL — ATORVASTATIN 80 MG TABLET: 80 | 30 days supply | Qty: 30 | Fill #1

## 2017-06-09 MED FILL — LOSARTAN POTASSIUM 25 MG TA: 25 | 30 days supply | Qty: 15 | Fill #1

## 2017-06-09 MED FILL — FUROSEMIDE 40 MG TAB: 40 | 30 days supply | Qty: 30 | Fill #1

## 2017-06-09 MED FILL — CARVEDILOL 3.125 MG TABLET: 3.125 | 30 days supply | Qty: 60 | Fill #1

## 2017-06-09 MED FILL — SPIRONOLACTONE 25 MG TABLET: 25 | 30 days supply | Qty: 15 | Fill #1

## 2017-06-15 NOTE — Progress Notes (Signed)
PCP: None  Primary Cardiologist: Dr Antoine Poche HF MD: Dr Shirlee Latch   HPI: [name] Joshua May is a 42 y.o. Joshua May male with a history of CAD dating back to 2007 when he had a PCI in [place]. In 2010 he presented with a STEMI and had an RCA DES placed. He followed up for a year but then didn't present again till Oct 2017 when he presented with a CFX infarct. Cath revealed 50% ISR of the RCA, total mCFX and 85% OM3. He had CFX PCI with DES and OM3 POBA. His EF then was 50-55%.  Admitted 02/27/17 with increased dyspnea and chest pain. CTA confirmed PE. ECHO completed and showed reduced EF 20-25%. Underwent LHC as noted below. [place] no evidence DVTs. CT surgery consulted for severe MR. Plan to optimize HF medications.Set up TEE as an outpatient. Discharge weight 222 pounds.   He presents today for regular follow up. Lasix held with hypotension. His weight is up 16 lbs from last visit. He has gained weight at home, but is feeling great. He has occasional "room spinning" dizziness, but no lightheadedness or near syncope. He denies CP or SOB. No fevers or chills. Fatigue very slightly improved. Taking all medications as directed  TEE 04/19/17 LVEF 35-40%, Severe MR with PISA ERO 0.43 cm^2. Suspect infarct related MR with tethering of the posterior leaflet.   ECHO 03/01/2017  Mildly dilated LV with EF 25-30%, diffuse hypokinesis with   inferolateral akinesis. Restrictive diastolic function. RV poorly   visualized but appears normal in size with mildly decreased   systolic function. D-shaped interventricular septum is suggestive   of RV pressure/volume overload. Moderate pulmonary hypertension.   Moderate to severe mitral regurgitation, suspect infarct-related   MR with restricted posterior leaflet and akinetic inferolateral   wall.  LHC 03/03/2017   Previously placed Prox Cx to Mid Cx stent (unknown type) is widely patent.  Prox Cx lesion is 45% stenosed. - proximal to the Stent -- FFR 0.88.  Prox RCA to Mid RCA  stent is 55% stenosed.  Dist LAD lesion is 20% stenosed.  LV end diastolic pressure is severely elevated. 30-31 mmHg  Review of systems complete and found to be negative unless listed in HPI.    SH:  Social History   Socioeconomic History  . Marital status: Married    Spouse name: Not on file  . Number of children: Not on file  . Years of education: Not on file  . Highest education level: Not on file  Occupational History  . Not on file  Social Needs  . Financial resource strain: Not on file  . Food insecurity:    Worry: Not on file    Inability: Not on file  . Transportation needs:    Medical: Not on file    Non-medical: Not on file  Tobacco Use  . Smoking status: Former Smoker    Packs/day: 0.50    Years: 17.00    Pack years: 8.50    Types: Cigarettes  . Smokeless tobacco: Never Used  . Tobacco comment: quit since heart attack in 10/2015  Substance and Sexual Activity  . Alcohol use: Not Currently  . Drug use: Not Currently    Types: Marijuana  . Sexual activity: Not on file  Lifestyle  . Physical activity:    Days per week: Not on file    Minutes per session: Not on file  . Stress: Not on file  Relationships  . Social connections:    Talks on phone:  Not on file    Gets together: Not on file    Attends religious service: Not on file    Active member of club or organization: Not on file    Attends meetings of clubs or organizations: Not on file    Relationship status: Not on file  . Intimate partner violence:    Fear of current or ex partner: Not on file    Emotionally abused: Not on file    Physically abused: Not on file    Forced sexual activity: Not on file  Other Topics Concern  . Not on file  Social History Narrative   The patient is married with two children.    FH:  Family History  Problem Relation Age of Onset  . Other Sister 6       THE PATIENT REPORTS A SISTER WITH A MYOCARDIAL INFARCTION IN HER 30s.(SHE IS NOW S/P CABG)  . Heart attack  Father   . Deep vein thrombosis Father     Past Medical History:  Diagnosis Date  . Asthma   . CAD (coronary artery disease)   . Chronic systolic CHF (congestive heart failure) (HCC)   . HLD (hyperlipidemia)    Qualifier: Diagnosis of  By: Antoine Poche, MD, Gerrit Heck    . Hypercholesteremia   . Inferior myocardial infarction [facility]) 2007   PCI of RCA  . Inferior myocardial infarction (HCC) 03/31/2008   PCI of RCA with bare metal stent  . Ischemic cardiomyopathy   . Mitral regurgitation   . Obstructive sleep apnea    Qualifier: Diagnosis of  By: Antoine Poche, MD, Gerrit Heck    . 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 Pulmonary embolism (HCC) 02/28/2017  . Tobacco abuse     Current Outpatient Medications  Medication Sig Dispense Refill  . atorvastatin (LIPITOR) 80 MG tablet Take 1 tablet (80 mg total) by mouth daily. 30 tablet 2  . BLACK CURRANT SEED OIL PO Take 2.5 mLs by mouth daily.    . carvedilol (COREG) 3.125 MG tablet Take 1 tablet (3.125 mg total) by mouth 2 (two) times daily with a meal. 60 tablet 2  . chlorhexidine (PERIDEX) 0.12 % solution Rinse with 15 mls twice daily for 30 seconds. Use after breakfast and at bedtime. Spit out excess. Do not swallow. 960 mL prn  . furosemide (LASIX) 20 MG tablet Take 1 tablet (20 mg total) by mouth daily. 34 tablet 3  . HYDROcodone-acetaminophen (NORCO) 5-325 MG tablet Take 1-2 tablets by mouth every 6 (six) hours as needed for moderate pain or severe pain. 32 tablet 0  . losartan (COZAAR) 25 MG tablet Take 0.5 tablets (12.5 mg total) by mouth daily. 15 tablet 2  . nitroGLYCERIN (NITROSTAT) 0.4 MG SL tablet Place 1 tablet (0.4 mg total) under the tongue every 5 (five) minutes as needed for chest pain. 25 tablet 2  . rivaroxaban (XARELTO) 20 MG TABS tablet Take 1 tablet (20 mg total) by mouth at bedtime. 30 tablet 2  . spironolactone (ALDACTONE) 25 MG tablet Take 0.5 tablets (12.5 mg total) by mouth  daily. 30 tablet 2   No current facility-administered medications for this visit.     Vitals:   06/16/17 1112  BP: 114/66  Pulse: 80  SpO2: 98%  Weight: 266 lb 3.2 oz (120.7 kg)    Wt Readings from Last 3 Encounters:  06/16/17 266 lb 3.2 oz (120.7 kg)  05/19/17 250 lb (113.4 kg)  05/13/17  256 lb 6 oz (116.3 kg)    PHYSICAL EXAM: General: Well appearing. No resp difficulty. HEENT: Normal Neck: Supple. JVP 6-7 cm. Carotids 2+ bilat; no bruits. No thyromegaly or nodule noted. Cor: PMI nondisplaced. RRR, No M/G/R noted Lungs: CTAB, normal effort. Abdomen: Soft, non-tender, non-distended, no HSM. No bruits or masses. +BS  Extremities: No cyanosis, clubbing, or rash. R and LLE no edema.  Neuro: Alert & orientedx3, cranial nerves grossly intact. moves all 4 extremities w/o difficulty. Affect pleasant   ASSESSMENT & PLAN: 1. Chronic Systolic Heart Failure, ICM - ECHO 02/2017 25-30%.  - NYHA III symptoms - Volume status stable on exam, despite weight gain on exam.   - Continue lasix 20 mg daily.  - Continue coreg 3.125 mg BID.  - Continue Spironolactone 12.5 mg daily.  - Continue losartan 12.5 mg qhs.  - Reinforced fluid restriction to < 2 L daily, sodium restriction to less than 2000 mg daily, and the importance of daily weights.   2. CAD - Stents in LCX and RCA. LHC cath 02/2017 nonobstructive disease.  - No s/s of ischemia.    - Continue statin, bb.  - On xarelto so no aspirin.  3. PE - CTA 02/2017 with PE note.  - ? Etiology. Father has venous atheroembolism.  - + lupus anticoagulant but with xarelto false positive so no need for hematology consult. .  - Continue lifelong Xarelto.   - No change to current plan.   4. Mitral Regurgitation - TEE 04/19/17 - LVEF 35-40%, Severe MR with PISA ERO 0.43 cm^2. Suspect infarct related MR with tethering of the posterior leaflet.  - Has seen Dr. Cornelius Moras. Plan for MVR 07/07/17.  We will likely follow along as inpatient.  - Dental extractions  completed 05/13/17. 5. Former Smoker - Encouraged continued abstinence.  6. Hypotension - Meds as above.  7. Vertigo - Refer to PT for vestibular rehab - Can try bonine OTC  Schedule with MD for 2 months. Will have MVR in the meantime, and we will very likely follow along as inpatient.   Graciella Freer, PA-C  11:55 AM   Greater than 50% of the 25 minute visit was spent in counseling/coordination of care regarding disease state education, salt/fluid restriction, sliding scale diuretics, and medication compliance.

## 2017-06-16 ENCOUNTER — Ambulatory Visit (HOSPITAL_COMMUNITY)
Admission: RE | Admit: 2017-06-16 | Discharge: 2017-06-16 | Disposition: A | Discharge status: Home / Self Care | Payer: Medicaid Other | Source: Ambulatory Visit | Attending: Internal Medicine | Admitting: Internal Medicine

## 2017-06-16 VITALS — BP 114/66 | HR 80 | Wt 266.2 lb

## 2017-06-16 DIAGNOSIS — Z87891 Personal history of nicotine dependence: Secondary | ICD-10-CM | POA: Insufficient documentation

## 2017-06-16 DIAGNOSIS — R42 Dizziness and giddiness: Secondary | ICD-10-CM

## 2017-06-16 DIAGNOSIS — I5022 Chronic systolic (congestive) heart failure: Secondary | ICD-10-CM

## 2017-06-16 DIAGNOSIS — Z79891 Long term (current) use of opiate analgesic: Secondary | ICD-10-CM | POA: Insufficient documentation

## 2017-06-16 DIAGNOSIS — I25110 Atherosclerotic heart disease of native coronary artery with unstable angina pectoris: Secondary | ICD-10-CM

## 2017-06-16 DIAGNOSIS — F17200 Nicotine dependence, unspecified, uncomplicated: Secondary | ICD-10-CM

## 2017-06-16 DIAGNOSIS — Z7901 Long term (current) use of anticoagulants: Secondary | ICD-10-CM | POA: Insufficient documentation

## 2017-06-16 DIAGNOSIS — I2510 Atherosclerotic heart disease of native coronary artery without angina pectoris: Secondary | ICD-10-CM | POA: Insufficient documentation

## 2017-06-16 DIAGNOSIS — E7800 Pure hypercholesterolemia, unspecified: Secondary | ICD-10-CM | POA: Insufficient documentation

## 2017-06-16 DIAGNOSIS — I340 Nonrheumatic mitral (valve) insufficiency: Secondary | ICD-10-CM

## 2017-06-16 DIAGNOSIS — Z86711 Personal history of pulmonary embolism: Secondary | ICD-10-CM | POA: Insufficient documentation

## 2017-06-16 DIAGNOSIS — E785 Hyperlipidemia, unspecified: Secondary | ICD-10-CM

## 2017-06-16 DIAGNOSIS — I272 Pulmonary hypertension, unspecified: Secondary | ICD-10-CM | POA: Insufficient documentation

## 2017-06-16 DIAGNOSIS — Z8249 Family history of ischemic heart disease and other diseases of the circulatory system: Secondary | ICD-10-CM | POA: Insufficient documentation

## 2017-06-16 DIAGNOSIS — J45909 Unspecified asthma, uncomplicated: Secondary | ICD-10-CM | POA: Insufficient documentation

## 2017-06-16 DIAGNOSIS — I252 Old myocardial infarction: Secondary | ICD-10-CM | POA: Insufficient documentation

## 2017-06-16 DIAGNOSIS — Z79899 Other long term (current) drug therapy: Secondary | ICD-10-CM | POA: Insufficient documentation

## 2017-06-16 DIAGNOSIS — G4733 Obstructive sleep apnea (adult) (pediatric): Secondary | ICD-10-CM

## 2017-06-16 DIAGNOSIS — I255 Ischemic cardiomyopathy: Secondary | ICD-10-CM

## 2017-06-16 DIAGNOSIS — I959 Hypotension, unspecified: Secondary | ICD-10-CM | POA: Insufficient documentation

## 2017-06-16 LAB — CBC
HEMATOCRIT: 40.7 % (ref 39.0–52.0)
HEMOGLOBIN: 13 g/dL (ref 13.0–17.0)
MCH: 29.6 pg (ref 26.0–34.0)
MCHC: 31.9 g/dL (ref 30.0–36.0)
MCV: 92.7 fL (ref 78.0–100.0)
Platelets: 268 10*3/uL (ref 150–400)
RBC: 4.39 MIL/uL (ref 4.22–5.81)
RDW: 12.6 % (ref 11.5–15.5)
WBC: 7.1 10*3/uL (ref 4.0–10.5)

## 2017-06-16 LAB — BASIC METABOLIC PANEL
Anion gap: 7 (ref 5–15)
BUN: 15 mg/dL (ref 6–20)
CHLORIDE: 108 mmol/L (ref 101–111)
CO2: 26 mmol/L (ref 22–32)
CREATININE: 1.03 mg/dL (ref 0.61–1.24)
Calcium: 9.1 mg/dL (ref 8.9–10.3)
GFR calc non Af Amer: >60 mL/min (ref >60)
Glucose, Bld: 164 mg/dL — ABNORMAL HIGH (ref 65–99)
POTASSIUM: 3.8 mmol/L (ref 3.5–5.1)
SODIUM: 141 mmol/L (ref 135–145)

## 2017-06-16 LAB — BRAIN NATRIURETIC PEPTIDE: B NATRIURETIC PEPTIDE 5: 39.2 pg/mL (ref 0.0–100.0)

## 2017-06-16 MED ORDER — MECLIZINE HCL 25 MG PO CHEW: 25.0000 mg | CHEWABLE_TABLET | Freq: Every day | ORAL | 0 refills | Status: DC | PRN

## 2017-06-16 NOTE — Addendum Note (Signed)
Encounter addended by: Chyrl Civatte, RN on: 06/16/2017 12:27 PM  Actions taken: Order list changed, Diagnosis association updated

## 2017-06-16 NOTE — Patient Instructions (Signed)
Routine lab work today. Will notify you of abnormal results, otherwise no news is good news!  Will order vestibular rehab for vertigo.  Take Bonine (meclizine) over the counter as instructed for dizziness.  Follow up 2 months with Dr. Shirlee Latch.  __________________________________________________________ [place] Code: 1400  Take all medication as prescribed the day of your appointment. Bring all medications with you to your appointment.  Do the following things EVERYDAY: 1) Weigh yourself in the morning before breakfast. Write it down and keep it in a log. 2) Take your medicines as prescribed 3) Eat low salt foods-Limit salt (sodium) to 2000 mg per day.  4) Stay as active as you can everyday 5) Limit all fluids for the day to less than 2 liters

## 2017-06-21 ENCOUNTER — Ambulatory Visit (INDEPENDENT_AMBULATORY_CARE_PROVIDER_SITE_OTHER): Payer: Self-pay | Admitting: Thoracic Surgery (Cardiothoracic Vascular Surgery)

## 2017-06-21 ENCOUNTER — Ambulatory Visit
Admission: RE | Admit: 2017-06-21 | Discharge: 2017-06-21 | Disposition: A | Discharge status: Home / Self Care | Payer: Medicaid Other | Source: Ambulatory Visit | Attending: Thoracic Surgery (Cardiothoracic Vascular Surgery) | Admitting: Thoracic Surgery (Cardiothoracic Vascular Surgery)

## 2017-06-21 ENCOUNTER — Encounter: Payer: Self-pay | Admitting: Thoracic Surgery (Cardiothoracic Vascular Surgery)

## 2017-06-21 ENCOUNTER — Other Ambulatory Visit: Payer: Self-pay

## 2017-06-21 VITALS — BP 108/71 | HR 76 | Resp 18 | Ht 71.0 in | Wt 261.0 lb

## 2017-06-21 DIAGNOSIS — I340 Nonrheumatic mitral (valve) insufficiency: Secondary | ICD-10-CM

## 2017-06-21 DIAGNOSIS — I255 Ischemic cardiomyopathy: Secondary | ICD-10-CM

## 2017-06-21 DIAGNOSIS — Z01818 Encounter for other preprocedural examination: Secondary | ICD-10-CM

## 2017-06-21 MED ORDER — IOPAMIDOL (ISOVUE-370) INJECTION 76%
75.0000 mL | Freq: Once | INTRAVENOUS | Status: AC | PRN
  Administered 2017-06-21: 75 mL via INTRAVENOUS

## 2017-06-21 NOTE — Progress Notes (Signed)
[address]       [name] [address]             [phone]     CARDIOTHORACIC SURGERY OFFICE NOTE  Referring Provider is Laurey Morale, MD Primary Cardiologist is Rollene Rotunda, MD PCP is Patient, No Pcp Per   HPI:  Patient is a 42 year old African-American male with history of premature coronary artery disease status post multiple previous myocardial infarctions treated with PCI and stenting,ischemic cardiomyopathy, chronic systolic congestive heart failure, hyperlipidemia, obstructive sleep apnea, lupus anticoagulant positive with h/o PE and long-standing tobacco abuse who returns to the office today for follow-up of severe secondary mitral regurgitation.  The patient was hospitalized in February of this year with acute exacerbation of chronic systolic congestive heart failure.  He was also diagnosed with small pulmonary embolus in the right lower lobe on CT angiogram, although imaging suggested that the pulmonary embolus was probably old.  I had the opportunity to see the patient in consultation at that time (full consultation note dated March 04, 2017).  His heart failure medications were optimized and since then the patient has been followed carefully in the advanced heart failure clinic.  He was seen in follow-up by Dr. Shirlee Latch at which time he was taking all medications as directed and clinically remained stable, although he still reports significant exertional shortness of breath.  He subsequently underwent TEE on April 19, 2017.  TEE revealed mildly dilated left ventricle with ejection fraction estimated 35-40% in the setting of severe mitral regurgitation.  There was inferolateral akinesis.  There was severe mitral regurgitation with ERO measured 0.43 cm.  Functional anatomy appeared consistent with classical type IIIb systolic restriction caused by infarct related mitral regurgitation with tethering of the posterior leaflet.  There was no obvious reversal  of flow in the pulmonary veins but there was severe systolic flattening.  There was a small patent foramen ovale appreciated.  The patient was referred back to our office and I last saw him on April 28, 2017 at which time we discussed the indications, risk, and potential benefits of elective mitral valve repair or replacement for stage D severe symptomatic secondary mitral regurgitation which had failed optimal medical therapy.  At the time the patient decided he wanted to proceed with surgery but schedule it for some time in June.  He returns to our office today for follow-up.  He has been seen recently in the advanced heart failure clinic where he is being followed very closely.  He states that he still gets short of breath with low-level activity.  He denies any resting shortness of breath, PND, orthopnea, or lower extremity edema.  Weight has been up several pounds recently.  He has not had any chest pain or chest tightness.   Current Outpatient Medications  Medication Sig Dispense Refill  . atorvastatin (LIPITOR) 80 MG tablet Take 1 tablet (80 mg total) by mouth daily. 30 tablet 2  . BLACK CURRANT SEED OIL PO Take 2.5 mLs by mouth daily.    . carvedilol (COREG) 3.125 MG tablet Take 1 tablet (3.125 mg total) by mouth 2 (two) times daily with a meal. 60 tablet 2  . chlorhexidine (PERIDEX) 0.12 % solution Rinse with 15 mls twice daily for 30 seconds. Use after breakfast and at bedtime. Spit out excess. Do not swallow. 960 mL prn  . furosemide (LASIX) 20 MG tablet Take 1 tablet (20 mg total) by mouth daily. 34 tablet 3  .  HYDROcodone-acetaminophen (NORCO) 5-325 MG tablet Take 1-2 tablets by mouth every 6 (six) hours as needed for moderate pain or severe pain. 32 tablet 0  . losartan (COZAAR) 25 MG tablet Take 0.5 tablets (12.5 mg total) by mouth daily. 15 tablet 2  . Meclizine HCl 25 MG CHEW Chew 1 tablet (25 mg total) by mouth daily as needed. 30 tablet 0  . nitroGLYCERIN (NITROSTAT) 0.4 MG SL tablet  Place 1 tablet (0.4 mg total) under the tongue every 5 (five) minutes as needed for chest pain. 25 tablet 2  . rivaroxaban (XARELTO) 20 MG TABS tablet Take 1 tablet (20 mg total) by mouth at bedtime. 30 tablet 2  . spironolactone (ALDACTONE) 25 MG tablet Take 0.5 tablets (12.5 mg total) by mouth daily. 30 tablet 2   No current facility-administered medications for this visit.       Physical Exam:   BP 108/71 (BP Location: Right Arm, Patient Position: Sitting, Cuff Size: Large)   Pulse 76   Resp 18   Ht 5\' 11"  (1.803 m)   Wt 261 lb (118.4 kg)   SpO2 98% Comment: RA  BMI 36.40 kg/m    General:  Well-appearing  Chest:   Clear to auscultation  CV:   Regular rate and rhythm  Incisions:  n/a  Abdomen:  Soft nontender  Extremities:  Warm and well-perfused, no edema  Diagnostic Tests:  I have personally reviewed images from the CT angiogram performed earlier today.  Official radiology interpretation remains pending at this time.   Impression:  Patient has long-standing history of premature coronary artery disease with multiple previous myocardial infarctions and ischemic cardiomyopathy with chronic systolic congestive heart failure.  He has stage D severe symptomatic secondary mitral regurgitation with significant persistent symptoms of exertional shortness of breath despite optimal medical therapy.  I have personally reviewed the patient's recent transesophageal echocardiogram which reveals classical type IIIb mitral valve dysfunction.  I agree the patient might benefit from mitral valve repair.   Risks associated with surgery should be acceptably low although undoubtedly influenced by the degree of left ventricular systolic dysfunction.  His left ventricle is dilated and the degree of downward displacement of the mitral subvalvular apparatus appears significant.  It is possible that mitral valve replacement will be necessary.  The patient might be at risk for a variety of complications  related to severe left ventricular dysfunction including respiratory failure, renal failure, need for prolonged under tropic support, or even the possibility of need for mechanical circulatory support.  However, given the patient's relatively young age I agree that surgical intervention seems quite reasonable.  The patient appears to be reasonable candidate for minimally invasive approach and CT angiography performed earlier today reveal no contraindication to peripheral cannulation for surgery.   Plan:  The patient was again counseled at length regarding the indications, risks and potential benefits of mitral valve repair or replacement.  The rationale for elective surgery has been explained, including a comparison between surgery and continued medical therapy with close follow-up.  Alternative surgical approaches have been discussed including a comparison between conventional sternotomy and minimally-invasive techniques.  The patient hopes to avoid conventional median sternotomy if possible.  Under the circumstances we would not plan elective surgical revascularization.  I think this is reasonable, although it remains possible that the patient's right coronary artery disease may become clinically significant at some point in the not too distant future.   The likelihood of successful and durable valve repair has been discussed with  particular reference to the findings of their recent echocardiogram.  Based upon these findings and previous experience, I have quoted them a greater than 50 percent likelihood of successful valve repair.  In the unlikely event that their valve cannot be successfully repaired, we discussed the possibility of replacing the mitral valve using a mechanical prosthesis with the attendant need for long-term anticoagulation versus the alternative of replacing it using a bioprosthetic tissue valve with its potential for late structural valve deterioration and failure, depending upon the  patient's longevity.  The patient specifically requests that if the mitral valve must be replaced that it be done using a mechanical valve.   The patient understands and accepts all potential risks of surgery including but not limited to risk of death, stroke or other neurologic complication, myocardial infarction, congestive heart failure, respiratory failure, renal failure, bleeding requiring transfusion and/or reexploration, arrhythmia, infection or other wound complications, pneumonia, pleural and/or pericardial effusion, pulmonary embolus, aortic dissection or other major vascular complication, or delayed complications related to valve repair or replacement including but not limited to structural valve deterioration and failure, thrombosis, embolization, endocarditis, or paravalvular leak.  All of his questions have been answered.  We plan to proceed with surgery on July 07, 2017.  Patient has been instructed to stop taking Xarelto 7 days prior to surgery.   I spent in excess of 15 minutes during the conduct of this office consultation and >50% of this time involved direct face-to-face encounter with the patient for counseling and/or coordination of their care.   Salvatore Decentlarence H. Cornelius Moraswen, MD 06/21/2017 2:55 PM

## 2017-06-21 NOTE — Patient Instructions (Signed)
Stop taking Xarelto 7 days prior to surgery (June 30, 2017)  Continue taking all other medications without change through the day before surgery.  Have nothing to eat or drink after midnight the night before surgery.  On the morning of surgery take only carvedilol (Coreg) with a sip of water.

## 2017-07-02 NOTE — Pre-Procedure Instructions (Signed)
Joshua CapriceKaleff H Townsend  07/02/2017      [facility] [place] - [address] [place] [address] Phone: [phone] Fax: [phone]    Your procedure is scheduled on Wednesday June 19.  [facility] at 6:30 A.M.  Call this number if you have problems the morning of surgery:  [phone]   Remember:  Do not eat or drink after midnight.   Take these medicines the morning of surgery with A SIP OF WATER: Carvedilol (coreg)  7 days prior to surgery STOP taking any Aleve, Naproxen, Ibuprofen, Motrin, Advil, Goody's, BC's, all herbal medications, fish oil, and all vitamins  **Follow your surgeon's instructions on stopping Rivaroxaban (Xarelto). If no instructions were given, please call your surgeon's office**      Do not wear jewelry, make-up or nail polish.  Do not wear lotions, powders, or perfumes, or deodorant.  Do not shave 48 hours prior to surgery.  Men may shave face and neck.  Do not bring valuables to the hospital.  [facility] is not responsible for any belongings or valuables.  Contacts, dentures or bridgework may not be worn into surgery.  Leave your suitcase in the car.  After surgery it may be brought to your room.  For patients admitted to the hospital, discharge time will be determined by your treatment team.  Patients discharged the day of surgery will not be allowed to drive home.   Special instructions:    [facility]- Preparing For Surgery  Before surgery, you can play an important role. Because skin is not sterile, your skin needs to be as free of germs as possible. You can reduce the number of germs on your skin by washing with CHG (chlorahexidine gluconate) Soap before surgery.  CHG is an antiseptic cleaner which kills germs and bonds with the skin to continue killing germs even after washing.    Oral Hygiene is also important to reduce your risk of infection.   Remember - BRUSH YOUR TEETH THE MORNING OF SURGERY WITH YOUR REGULAR TOOTHPASTE  Please do not use if you have an allergy to CHG or antibacterial soaps. If your skin becomes reddened/irritated stop using the CHG.  Do not shave (including legs and underarms) for at least 48 hours prior to first CHG shower. It is OK to shave your face.  Please follow these instructions carefully.   1. Shower the NIGHT BEFORE SURGERY and the MORNING OF SURGERY with CHG.   2. If you chose to wash your hair, wash your hair first as usual with your normal shampoo.  3. After you shampoo, rinse your hair and body thoroughly to remove the shampoo.  4. Use CHG as you would any other liquid soap. You can apply CHG directly to the skin and wash gently with a scrungie or a clean washcloth.   5. Apply the CHG Soap to your body ONLY FROM THE NECK DOWN.  Do not use on open wounds or open sores. Avoid contact with your eyes, ears, mouth and genitals (private parts). Wash Face and genitals (private parts)  with your normal soap.  6. Wash thoroughly, paying special attention to the area where your surgery will be performed.  7. Thoroughly rinse your body with warm water from the neck down.  8. DO NOT shower/wash with your normal soap after using and rinsing off the CHG Soap.  9. Pat yourself dry with a CLEAN TOWEL.  10. Wear CLEAN PAJAMAS to bed the night before surgery, wear comfortable clothes the morning of surgery  11. Place CLEAN SHEETS on your bed the night of your first shower and DO NOT SLEEP WITH PETS.    Day of Surgery:  Do not apply any deodorants/lotions.  Please wear clean clothes to the hospital/surgery center.   Remember to brush your teeth WITH YOUR REGULAR TOOTHPASTE.    Please read over the following fact sheets that you were given. Coughing and Deep Breathing, MRSA Information and Surgical Site Infection Prevention

## 2017-07-05 ENCOUNTER — Encounter (HOSPITAL_COMMUNITY)
Admission: RE | Admit: 2017-07-05 | Discharge: 2017-07-05 | Disposition: A | Discharge status: Home / Self Care | Payer: Self-pay | Source: Ambulatory Visit | Attending: Thoracic Surgery (Cardiothoracic Vascular Surgery) | Admitting: Thoracic Surgery (Cardiothoracic Vascular Surgery)

## 2017-07-05 ENCOUNTER — Other Ambulatory Visit (HOSPITAL_COMMUNITY): Payer: Medicaid Other

## 2017-07-05 ENCOUNTER — Encounter (HOSPITAL_COMMUNITY): Payer: Self-pay

## 2017-07-05 ENCOUNTER — Ambulatory Visit (HOSPITAL_BASED_OUTPATIENT_CLINIC_OR_DEPARTMENT_OTHER)
Admission: RE | Admit: 2017-07-05 | Discharge: 2017-07-05 | Disposition: A | Discharge status: Home / Self Care | Payer: Self-pay | Source: Ambulatory Visit | Attending: Thoracic Surgery (Cardiothoracic Vascular Surgery) | Admitting: Thoracic Surgery (Cardiothoracic Vascular Surgery)

## 2017-07-05 ENCOUNTER — Ambulatory Visit (HOSPITAL_COMMUNITY)
Admission: RE | Admit: 2017-07-05 | Discharge: 2017-07-05 | Disposition: A | Discharge status: Home / Self Care | Payer: Self-pay | Source: Ambulatory Visit | Attending: Thoracic Surgery (Cardiothoracic Vascular Surgery) | Admitting: Thoracic Surgery (Cardiothoracic Vascular Surgery)

## 2017-07-05 ENCOUNTER — Other Ambulatory Visit: Payer: Self-pay

## 2017-07-05 DIAGNOSIS — I5022 Chronic systolic (congestive) heart failure: Secondary | ICD-10-CM | POA: Insufficient documentation

## 2017-07-05 DIAGNOSIS — I6523 Occlusion and stenosis of bilateral carotid arteries: Secondary | ICD-10-CM | POA: Insufficient documentation

## 2017-07-05 DIAGNOSIS — Z87891 Personal history of nicotine dependence: Secondary | ICD-10-CM | POA: Insufficient documentation

## 2017-07-05 DIAGNOSIS — E785 Hyperlipidemia, unspecified: Secondary | ICD-10-CM | POA: Insufficient documentation

## 2017-07-05 DIAGNOSIS — I340 Nonrheumatic mitral (valve) insufficiency: Secondary | ICD-10-CM | POA: Insufficient documentation

## 2017-07-05 DIAGNOSIS — Z86711 Personal history of pulmonary embolism: Secondary | ICD-10-CM | POA: Insufficient documentation

## 2017-07-05 DIAGNOSIS — I252 Old myocardial infarction: Secondary | ICD-10-CM | POA: Insufficient documentation

## 2017-07-05 DIAGNOSIS — Z7901 Long term (current) use of anticoagulants: Secondary | ICD-10-CM | POA: Insufficient documentation

## 2017-07-05 DIAGNOSIS — Z01812 Encounter for preprocedural laboratory examination: Secondary | ICD-10-CM | POA: Insufficient documentation

## 2017-07-05 DIAGNOSIS — Z0183 Encounter for blood typing: Secondary | ICD-10-CM | POA: Insufficient documentation

## 2017-07-05 DIAGNOSIS — I2510 Atherosclerotic heart disease of native coronary artery without angina pectoris: Secondary | ICD-10-CM | POA: Insufficient documentation

## 2017-07-05 DIAGNOSIS — Q211 Atrial septal defect: Secondary | ICD-10-CM | POA: Insufficient documentation

## 2017-07-05 DIAGNOSIS — Z79899 Other long term (current) drug therapy: Secondary | ICD-10-CM | POA: Insufficient documentation

## 2017-07-05 DIAGNOSIS — I255 Ischemic cardiomyopathy: Secondary | ICD-10-CM | POA: Insufficient documentation

## 2017-07-05 DIAGNOSIS — Z955 Presence of coronary angioplasty implant and graft: Secondary | ICD-10-CM | POA: Insufficient documentation

## 2017-07-05 DIAGNOSIS — G4733 Obstructive sleep apnea (adult) (pediatric): Secondary | ICD-10-CM | POA: Insufficient documentation

## 2017-07-05 DIAGNOSIS — Z01818 Encounter for other preprocedural examination: Secondary | ICD-10-CM | POA: Insufficient documentation

## 2017-07-05 DIAGNOSIS — R9431 Abnormal electrocardiogram [ECG] [EKG]: Secondary | ICD-10-CM | POA: Insufficient documentation

## 2017-07-05 HISTORY — DX: Migraine, unspecified, not intractable, without status migrainosus: G43.909

## 2017-07-05 LAB — CBC
HCT: 39.6 % (ref 39.0–52.0)
HEMOGLOBIN: 12.4 g/dL — AB (ref 13.0–17.0)
MCH: 29.7 pg (ref 26.0–34.0)
MCHC: 31.3 g/dL (ref 30.0–36.0)
MCV: 95 fL (ref 78.0–100.0)
Platelets: 291 10*3/uL (ref 150–400)
RBC: 4.17 MIL/uL — ABNORMAL LOW (ref 4.22–5.81)
RDW: 12.4 % (ref 11.5–15.5)
WBC: 6.3 10*3/uL (ref 4.0–10.5)

## 2017-07-05 LAB — COMPREHENSIVE METABOLIC PANEL
ALBUMIN: 3.7 g/dL (ref 3.5–5.0)
ALT: 32 U/L (ref 17–63)
ANION GAP: 7 (ref 5–15)
AST: 29 U/L (ref 15–41)
Alkaline Phosphatase: 101 U/L (ref 38–126)
BUN: 11 mg/dL (ref 6–20)
CO2: 26 mmol/L (ref 22–32)
Calcium: 9 mg/dL (ref 8.9–10.3)
Chloride: 108 mmol/L (ref 101–111)
Creatinine, Ser: 0.97 mg/dL (ref 0.61–1.24)
GFR calc Af Amer: >60 mL/min (ref >60)
GFR calc non Af Amer: >60 mL/min (ref >60)
GLUCOSE: 107 mg/dL — AB (ref 65–99)
POTASSIUM: 4.1 mmol/L (ref 3.5–5.1)
SODIUM: 141 mmol/L (ref 135–145)
Total Bilirubin: 0.6 mg/dL (ref 0.3–1.2)
Total Protein: 6.5 g/dL (ref 6.5–8.1)

## 2017-07-05 LAB — PULMONARY FUNCTION TEST
DL/VA % PRED: 87 %
DL/VA: 4.11 ml/min/mmHg/L
DLCO UNC: 21.51 ml/min/mmHg
DLCO unc % pred: 63 %
FEF 25-75 POST: 3.16 L/s
FEF 25-75 PRE: 2.29 L/s
FEF2575-%Change-Post: 37 %
FEF2575-%PRED-PRE: 59 %
FEF2575-%Pred-Post: 82 %
FEV1-%Change-Post: 10 %
FEV1-%PRED-POST: 72 %
FEV1-%Pred-Pre: 65 %
FEV1-Post: 2.68 L
FEV1-Pre: 2.41 L
FEV1FVC-%CHANGE-POST: 0 %
FEV1FVC-%Pred-Pre: 97 %
FEV6-%Change-Post: 10 %
FEV6-%PRED-POST: 74 %
FEV6-%Pred-Pre: 67 %
FEV6-POST: 3.31 L
FEV6-PRE: 3 L
FEV6FVC-%PRED-POST: 102 %
FEV6FVC-%PRED-PRE: 102 %
FVC-%Change-Post: 10 %
FVC-%Pred-Post: 73 %
FVC-%Pred-Pre: 66 %
FVC-Post: 3.31 L
FVC-Pre: 3 L
PRE FEV1/FVC RATIO: 80 %
Post FEV1/FVC ratio: 81 %
Post FEV6/FVC ratio: 100 %
Pre FEV6/FVC Ratio: 100 %
RV % pred: 117 %
RV: 2.23 L
TLC % PRED: 80 %
TLC: 5.75 L

## 2017-07-05 LAB — URINALYSIS, ROUTINE W REFLEX MICROSCOPIC
BILIRUBIN URINE: NEGATIVE
GLUCOSE, UA: NEGATIVE mg/dL
HGB URINE DIPSTICK: NEGATIVE
KETONES UR: NEGATIVE mg/dL
Leukocytes, UA: NEGATIVE
Nitrite: NEGATIVE
Protein, ur: NEGATIVE mg/dL
Specific Gravity, Urine: 1.013 (ref 1.005–1.030)
pH: 5 (ref 5.0–8.0)

## 2017-07-05 LAB — TYPE AND SCREEN
ABO/RH(D): B POS
ANTIBODY SCREEN: NEGATIVE

## 2017-07-05 LAB — HEMOGLOBIN A1C
Hgb A1c MFr Bld: 4.9 % (ref 4.8–5.6)
Mean Plasma Glucose: 93.93 mg/dL

## 2017-07-05 LAB — SURGICAL PCR SCREEN
MRSA, PCR: NEGATIVE
Staphylococcus aureus: NEGATIVE

## 2017-07-05 LAB — ABO/RH: ABO/RH(D): B POS

## 2017-07-05 LAB — PROTIME-INR
INR: 1.01
PROTHROMBIN TIME: 13.2 s (ref 11.4–15.2)

## 2017-07-05 LAB — APTT: APTT: 30 s (ref 24–36)

## 2017-07-05 MED ORDER — ALBUTEROL SULFATE (2.5 MG/3ML) 0.083% IN NEBU
2.5000 mg | INHALATION_SOLUTION | Freq: Once | RESPIRATORY_TRACT | Status: AC
  Administered 2017-07-05: 2.5 mg via RESPIRATORY_TRACT

## 2017-07-05 NOTE — Progress Notes (Signed)
Vascular preliminary.  Right Carotid:Velocities in the right ICA are consistent with a 1-39% stenosis.  Left Carotid: Velocities in the left ICA are consistent with a 1-39% stenosis.   Right Upper Extremity: Radial and Ulnar Doppler waveforms remain within normal limits with compression.  Left Upper Extremity: Radial Doppler waveform obliterate with compression. Ulnar Doppler waveforms decrease >50% with compression. Atypical dampened waveforms, although BP remains comparable to right arm.  Farrel DemarkJill Eunice, RDMS, RVT

## 2017-07-05 NOTE — Progress Notes (Signed)
[facility]  Cardiologist - Dr. Marca Anconaalton McLean at [facility]   Chest x-ray - 07/05/2017 EKG - 07/05/2017 Stress Test - patient denies ECHO - 04/20/2017 Cardiac Cath - 03/03/2017  Sleep Study - patient reports positive sleep study in 2011, but has not followed up.  CPAP - patient denies  Blood Thinner Instructions: Xarelto, stopped on 06/30/2017  Anesthesia review: Yes  Patient denies shortness of breath, fever, cough and chest pain at PAT appointment   Patient verbalized understanding of instructions that were given to them at the PAT appointment. Patient was also instructed that they will need to review over the PAT instructions again at home before surgery.  Viviano SimasMorgan N Laithan Conchas, RN

## 2017-07-06 ENCOUNTER — Encounter (HOSPITAL_COMMUNITY): Payer: Self-pay | Admitting: Certified Registered Nurse Anesthetist

## 2017-07-06 MED ORDER — PLASMA-LYTE 148 IV SOLN
INTRAVENOUS | Status: DC
  Filled 2017-07-06: qty 2.5

## 2017-07-06 MED ORDER — GLUTARALDEHYDE 0.625% SOAKING SOLUTION
TOPICAL | Status: DC
  Filled 2017-07-06: qty 50

## 2017-07-06 MED ORDER — KENNESTONE BLOOD CARDIOPLEGIA VIAL
13.0000 mL | Freq: Once | Status: DC
  Filled 2017-07-06 (×2): qty 13

## 2017-07-06 MED ORDER — TRANEXAMIC ACID 1000 MG/10ML IV SOLN
1.5000 mg/kg/h | INTRAVENOUS | Status: AC
  Administered 2017-07-07: 1.5 mg/kg/h via INTRAVENOUS
  Filled 2017-07-06: qty 25

## 2017-07-06 MED ORDER — SODIUM CHLORIDE 0.9 % IV SOLN
INTRAVENOUS | Status: DC
  Filled 2017-07-06: qty 30

## 2017-07-06 MED ORDER — DEXMEDETOMIDINE HCL IN NACL 400 MCG/100ML IV SOLN
0.1000 ug/kg/h | INTRAVENOUS | Status: AC
  Administered 2017-07-07: .3 ug/kg/h via INTRAVENOUS
  Filled 2017-07-06: qty 100

## 2017-07-06 MED ORDER — SODIUM CHLORIDE 0.9 % IV SOLN
750.0000 mg | INTRAVENOUS | Status: DC
  Filled 2017-07-06: qty 750

## 2017-07-06 MED ORDER — EPINEPHRINE PF 1 MG/ML IJ SOLN
0.0000 ug/min | INTRAVENOUS | Status: DC
  Filled 2017-07-06: qty 4

## 2017-07-06 MED ORDER — KENNESTONE BLOOD CARDIOPLEGIA (KBC) MANNITOL SYRINGE (20%, 32ML)
32.0000 mL | Freq: Once | INTRAVENOUS | Status: DC
  Filled 2017-07-06: qty 32

## 2017-07-06 MED ORDER — POTASSIUM CHLORIDE 2 MEQ/ML IV SOLN
80.0000 meq | INTRAVENOUS | Status: DC
  Filled 2017-07-06: qty 40

## 2017-07-06 MED ORDER — TRANEXAMIC ACID (OHS) PUMP PRIME SOLUTION
2.0000 mg/kg | INTRAVENOUS | Status: DC
  Filled 2017-07-06: qty 2.44

## 2017-07-06 MED ORDER — PHENYLEPHRINE HCL 10 MG/ML IJ SOLN
30.0000 ug/min | INTRAMUSCULAR | Status: AC
  Administered 2017-07-07: 15 ug/min via INTRAVENOUS
  Filled 2017-07-06: qty 2

## 2017-07-06 MED ORDER — NOREPINEPHRINE 4 MG/250ML-% IV SOLN
0.0000 ug/min | INTRAVENOUS | Status: DC
  Filled 2017-07-06 (×2): qty 250

## 2017-07-06 MED ORDER — NITROGLYCERIN IN D5W 200-5 MCG/ML-% IV SOLN
2.0000 ug/min | INTRAVENOUS | Status: DC
  Filled 2017-07-06: qty 250

## 2017-07-06 MED ORDER — VANCOMYCIN HCL 1000 MG IV SOLR
INTRAVENOUS | Status: AC
  Administered 2017-07-07: 1000 mL
  Filled 2017-07-06: qty 1000

## 2017-07-06 MED ORDER — VANCOMYCIN HCL 10 G IV SOLR
1500.0000 mg | INTRAVENOUS | Status: AC
  Administered 2017-07-07: 1500 mg via INTRAVENOUS
  Filled 2017-07-06: qty 1500

## 2017-07-06 MED ORDER — DOPAMINE-DEXTROSE 3.2-5 MG/ML-% IV SOLN
0.0000 ug/kg/min | INTRAVENOUS | Status: DC
  Filled 2017-07-06: qty 250

## 2017-07-06 MED ORDER — KENNESTONE BLOOD CARDIOPLEGIA VIAL
13.0000 mL | Freq: Once | Status: DC
  Filled 2017-07-06: qty 13

## 2017-07-06 MED ORDER — SODIUM CHLORIDE 0.9 % IV SOLN
INTRAVENOUS | Status: AC
  Administered 2017-07-07: 1 [IU]/h via INTRAVENOUS
  Filled 2017-07-06: qty 1

## 2017-07-06 MED ORDER — MAGNESIUM SULFATE 50 % IJ SOLN
40.0000 meq | INTRAMUSCULAR | Status: DC
  Filled 2017-07-06: qty 9.85

## 2017-07-06 MED ORDER — TRANEXAMIC ACID (OHS) BOLUS VIA INFUSION
15.0000 mg/kg | INTRAVENOUS | Status: AC
  Administered 2017-07-07: 1830 mg via INTRAVENOUS
  Filled 2017-07-06: qty 1830

## 2017-07-06 MED ORDER — SODIUM CHLORIDE 0.9 % IV SOLN
1.5000 g | INTRAVENOUS | Status: AC
  Administered 2017-07-07: 1.5 g via INTRAVENOUS
  Administered 2017-07-07: .75 g via INTRAVENOUS
  Filled 2017-07-06: qty 1.5

## 2017-07-06 MED ORDER — MILRINONE LACTATE IN DEXTROSE 20-5 MG/100ML-% IV SOLN
0.1250 ug/kg/min | INTRAVENOUS | Status: AC
  Administered 2017-07-07: .375 ug/kg/min via INTRAVENOUS
  Filled 2017-07-06 (×2): qty 100

## 2017-07-06 NOTE — Progress Notes (Signed)
Anesthesia Chart Review:  Case:  [id number] Date/Time:  07/07/17 0815   Procedures:      MINIMALLY INVASIVE MITRAL VALVE REPAIR (MVR) (Right Chest)     TRANSESOPHAGEAL ECHOCARDIOGRAM (TEE) (N/A )   Anesthesia type:  General   Pre-op diagnosis:  MR   [facility]  [facility] ROOM 15 / [facility]   Surgeon:  Purcell Nails, MD      DISCUSSION: Patient is a 42 year old male scheduled for the above procedure. He is s/p multiple teeth extractions and gross debridement of remaining teeth on 05/13/17.  History includes former smoker, CAD (PCI 01/18/06, [place]; inferior STEMI [occluded RCA] s/p BMS RCA 03/31/08; inferolateral STEMI [occluded CX] s/p DES CX, POBA OM3 10/20/15; proximal-mid CX stent widely patent, proximal CX stent 45% stenosis with FFR 0.88, proximal-mid RCA stent 55% stenosis, LVEDP 30-38mmHg, medical Rx for CAD, refer fo possible MV repair 03/03/17), severe mitral regurgitation (suspect infarct related MR with tethering of the posterior leaflet), ischemic cardiomyopathy, chronic systolic CHF (EF 04-54% 02/2017, 35-40% 04/2017), asthma, migraines/vertigo, OSA (reports no f/u since 2011), PE (diagnosed 02/27/17, but mostly resolved at that time by CTA; unlcear etiology, + family history, + lupus anticoagulant but on Xarelto so could be false positive, life long anticoagulation recommended). 04/2017 TEE showed a small PFO, but negative bubble study.  Per Dr. Orvan July 06/21/17 progress notes, patient instructed to hold Xarelto for 7 days prior to surgery (last dose 06/30/17).  If no acute changes in anticipate that he can proceed as planned. He is for ABG on the day of surgery.   VS: BP 100/60   Pulse 78   Temp 36.7 C   Resp 20   Ht 5\' 11"  (1.803 m)   Wt 269 lb (122 kg)   SpO2 99%   BMI 37.52 kg/m   PROVIDERS: PCP is with [facility]. Cardiologist is Rollene Rotunda, MD. HF Cardiologist is Marca Ancona, MD. Last visit was 06/15/17 with Maxine Glenn, PA-C.    LABS:  Labs reviewed: Acceptable for surgery. (all labs ordered are listed, but only abnormal results are displayed)  Labs Reviewed  CBC - Abnormal; Notable for the following components:      Result Value   RBC 4.17 (*)    Hemoglobin 12.4 (*)    All other components within normal limits  COMPREHENSIVE METABOLIC PANEL - Abnormal; Notable for the following components:   Glucose, Bld 107 (*)    All other components within normal limits  URINALYSIS, ROUTINE W REFLEX MICROSCOPIC - Abnormal; Notable for the following components:   Color, Urine STRAW (*)    All other components within normal limits  SURGICAL PCR SCREEN  APTT  HEMOGLOBIN A1C  PROTIME-INR  TYPE AND SCREEN  ABO/RH    IMAGES: CXR 07/05/17:  IMPRESSION: There is no pneumonia nor other acute cardiopulmonary abnormality.  CTA chest/abd/pelvis 06/21/17: IMPRESSION: CTA CHEST 1. Cardiomegaly with left ventricular dilatation consistent with the clinical history of mitral valve insufficiency. 2. Mild paraseptal pulmonary emphysema.  Emphysema (ICD10-J43.9). 3. Left circumflex coronary artery stent. 4. No evidence of aortic aneurysm or dissection. CTA ABD/PELVIS 1. No evidence of aneurysm, dissection or significant tortuosity or hemodynamically significant stenosis. 2. Mild fibrofatty atherosclerotic plaque along the abdominal aorta. Aortic Atherosclerosis (ICD10-170.0) 3. 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  OTHER:  PFTs 07/05/17: FVC 3.00 (66%, 73% post), FEV1 2.41 (65%, 72% post), DLCO unc 21.51 (63%).    EKG: 07/05/17:  Normal sinus rhythm.  Inferior infarct, age undetermined.  T wave abnormality, consider anterolateral ischemia.   CV:  Echo (TEE) 04/19/17: Study Conclusions - Left ventricle: Mildly dilated left ventricle with EF 35-40%. Basal to mid inferior and inferolateral akinesis. - Aortic valve: There was no stenosis. - Aorta: Normal caliber thoracic  aorta with minimal plaque. - Mitral valve: There was severe mitral regurgitation with PISA ERO 0.43 cm^2. Suspect infarct-related MR with tethering of the posterior leaflet. There was flattening but not frank reversal of the pulmonary vein doppler pattern. - Left atrium: The atrium was mildly dilated. No evidence of thrombus in the atrial cavity or appendage. - Right ventricle: The cavity size was normal. Systolic function was normal. - Right atrium: No evidence of thrombus in the atrial cavity or appendage. - Atrial septum: There was a small PFO by color doppler but bubble study was negative. - Tricuspid valve: Peak RV-RA gradient (S): 22 mm Hg. Impressions: - Severe infarct-related MR. EF better than it was on last echo. I will review with cardiac surgeon. As he still is symptomatic with exertional dyspnea, think he may benefit from MV repair. (Comparison echo 03/01/17: EF 25-30%, diffuse hypokinesis with inferolateral akinesis, moderate-severe MR, peak RV-RA gradient 50 mmHg, PA peak pressure 58 mmHg.)  Cardiac cath 03/03/17: Conclusions:  Previously placed Prox Cx to Mid Cx stent (unknown type) is widely patent.  Prox Cx lesion is 45% stenosed. - proximal to the Stent -- FFR 0.88.  Prox RCA to Mid RCA stent is 55% stenosed.  Dist LAD lesion is 20% stenosed.  LV end diastolic pressure is severely elevated. 30-31 mmHg - Angiographically moderate lesion prior to the circumflex stent which is likely just a step up. FFR 0.88. Otherwise minimal CAD. No change to the in-stent restenosis of the RCA. - Severely elevated EDP, likely related to cardiomyopathy and mitral regurgitation. Plan: Medical management and consider possibility of mitral valve repair.  Carotid U/S 07/05/17: Final Interpretation: Right Carotid: Velocities in the right ICA are consistent with a 1-39% stenosis. Left Carotid: Velocities in the left ICA are consistent with a 1-39% stenosis.   Past  Medical History:  Diagnosis Date  . Asthma   . CAD (coronary artery disease)   . Chronic systolic CHF (congestive heart failure) (HCC)   . HLD (hyperlipidemia)    Qualifier: Diagnosis of  By: Antoine PocheHochrein, MD, Gerrit HeckFACC, James    . Hypercholesteremia   . Inferior myocardial infarction [facility](HCC) 2007   PCI of RCA  . Inferior myocardial infarction (HCC) 03/31/2008   PCI of RCA with bare metal stent  . Ischemic cardiomyopathy   . Migraines    and dizziness  . Mitral regurgitation   . Obstructive sleep apnea    Qualifier: Diagnosis of  By: Antoine PocheHochrein, MD, Gerrit HeckFACC, James    . 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 Pulmonary embolism (HCC) 02/28/2017  . Tobacco abuse     Past Surgical History:  Procedure Laterality Date  . CARDIAC CATHETERIZATION N/A 10/20/2015   Procedure: Left Heart Cath and Coronary Angiography;  Surgeon: Kathleene Hazelhristopher D McAlhany, MD;  [facility];  Service: Cardiovascular;  Laterality: N/A;  . CARDIAC CATHETERIZATION N/A 10/20/2015   Procedure: Coronary Stent Intervention;  Surgeon: Kathleene Hazelhristopher D McAlhany, MD;  Location: [facility] INVAS[facility] CV LAB;  Service: Cardiovascular;  Laterality: N/A;  . INTRAVASCULAR PRESSURE WIRE/FFR STUDY N/A 03/03/2017   Procedure: INTRAVASCULAR PRESSURE WIRE/FFR STUDY;  Surgeon: Marykay LexHarding, David W, MD;  Location:  [facility] INVASIVE CV LAB;  Service: Cardiovascular;  Laterality: N/A;  . LEFT HEART CATH AND CORONARY ANGIOGRAPHY N/A 03/03/2017   Procedure: LEFT HEART CATH AND CORONARY ANGIOGRAPHY;  Surgeon: Marykay Lex, MD;  Location: [facility] INVASIVE CV LAB;  Service: Cardiovascular;  Laterality: N/A;  . MULTIPLE EXTRACTIONS WITH ALVEOLOPLASTY N/A 05/13/2017   Procedure: Extraction of tooth #'s 2,15,16,17 with alveoloplasty and gross debridement of remaining teeth.;  Surgeon: Charlynne Pander, DDS;  Location: [facility] OR;  Service: Oral Surgery;  Laterality: N/A;  . none    . OTHER SURGICAL HISTORY     NONE  . TEE WITHOUT  CARDIOVERSION N/A 04/19/2017   Procedure: TRANSESOPHAGEAL ECHOCARDIOGRAM (TEE);  Surgeon: Laurey Morale, MD;  Location: [facility] ENDOSCOPY;  Service: Cardiovascular;  Laterality: N/A;    MEDICATIONS: . atorvastatin (LIPITOR) 80 MG tablet  . carvedilol (COREG) 3.125 MG tablet  . chlorhexidine (PERIDEX) 0.12 % solution  . furosemide (LASIX) 20 MG tablet  . HYDROcodone-acetaminophen (NORCO) 5-325 MG tablet  . losartan (COZAAR) 25 MG tablet  . Meclizine HCl 25 MG CHEW  . nitroGLYCERIN (NITROSTAT) 0.4 MG SL tablet  . rivaroxaban (XARELTO) 20 MG TABS tablet  . spironolactone (ALDACTONE) 25 MG tablet   No current facility-administered medications for this encounter.    Melene Muller ON 07/07/2017] cefUROXime (ZINACEF) 1.5 g in sodium chloride 0.9 % 100 mL IVPB  . [START ON 07/07/2017] dexmedetomidine (PRECEDEX) 400 MCG/100ML (4 mcg/mL) infusion  . [START ON 07/07/2017] DOPamine (INTROPIN) 800 mg in dextrose 5 % 250 mL (3.2 mg/mL) infusion  . [START ON 07/07/2017] EPINEPHrine (ADRENALIN) 4 mg in dextrose 5 % 250 mL (0.016 mg/mL) infusion  . [START ON 07/07/2017] glutaraldehyde 0.625% cardiac soaking solution  . [START ON 07/07/2017] heparin 30,000 units/NS 1000 mL solution for CELLSAVER  . [START ON 07/07/2017] insulin regular (NOVOLIN R,HUMULIN R) 100 Units in sodium chloride 0.9 % 100 mL (1 Units/mL) infusion  . [START ON 07/07/2017] magnesium sulfate (IV Push/IM) injection 40 mEq  . [START ON 07/07/2017] nitroGLYCERIN 50 mg in dextrose 5 % 250 mL (0.2 mg/mL) infusion  . [START ON 07/07/2017] norepinephrine (LEVOPHED) 4mg  in D5W premix infusion  . [START ON 07/07/2017] phenylephrine (NEO-SYNEPHRINE) 20 mg in sodium chloride 0.9 % 250 mL (0.08 mg/mL) infusion  . [START ON 07/07/2017] potassium chloride injection 80 mEq  . [START ON 07/07/2017] vancomycin (VANCOCIN) 1,500 mg in sodium chloride 0.9 % 250 mL IVPB    Velna Ochs [facility] Short Stay Center/Anesthesiology Phone [phone] 07/06/2017  10:09 AM

## 2017-07-06 NOTE — H&P (Signed)
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    Patient's cardiac history dates back to 2007 when he suffered an acute inferior wall myocardial infarction.  At the time he lived in [place] and he was treated with PCI and stenting of the right coronary artery.  Approximately 10 years ago he moved to [facility] and in March 2010 he suffered another acute inferior wall myocardial infarction involving the right coronary artery.  He was treated with PCI and stenting of the right coronary artery using a bare-metal stent.  He remained stable from a cardiac standpoint until October 2017 when he presented with a third acute myocardial infarction involving acute occlusion of the left circumflex coronary artery.  He was treated with PCI and stenting of the left circumflex coronary artery using a drug eluting stent.  The right coronary artery remained patent at that time with 50% stenosis.  At that time left ventricular ejection fraction was estimated 40-45% and the patient was reported to have mild mitral regurgitation.  The patient states that ever since then he has had symptoms of exertional shortness of breath.  Symptoms  remained quite stable until over the past 2-3 months when he developed considerable progression of shortness of breath.  He began to experience resting shortness of breath and orthopnea.  He has never had any chest pain or chest tightness.  Approximately 1 week ago the patient experienced new onset epigastric abdominal pain.  Pain was described as sharp and constant.  Initially he attributed this to food.  Symptoms persisted over several days, ultimately prompting him to present to the emergency department.  EKGs revealed sinus rhythm without acute ischemic changes.  Serial troponin levels remain negative.  CT angiogram of the chest revealed pulmonary embolus involving the right lower lobe.  Transthoracic echocardiogram revealed severe left ventricular systolic dysfunction with ejection fraction estimated 25-30%.  Left ventricle was mildly dilated.  There was diffuse hypokinesis with inferolateral akinesis.  There was moderate to severe mitral regurgitation.  Attempts to quantify mitral regurgitation revealed ERO reported 0.09 cm corresponding to regurgitant volume estimated 11 mL.  The ventricular septum was D-shaped, consistent with likely right ventricular pressure and volume overload.  The patient subsequently underwent left heart catheterization March 03, 2017.  Previous placed stents in the left circumflex were widely patent.  There was 45% stenosis of the proximal left circumflex coronary artery which was not felt to be hemodynamically significant using FFR.  Stents in the proximal and mid right coronary artery were patent with 55% stenosis.  There was nonobstructive disease in the left anterior descending coronary artery.  Left ventricular end-diastolic pressure was severely elevated, estimated 30-31 mmHg.  Right heart catheterization was not performed.  Cardiothoracic surgical consultation was requested.  The patient is married and lives locally in [place] with his wife and 2 teenage children.  He  is currently out of work, having previously worked in [employer].  He states that he has been limited by exertional shortness of breath dating back to October 2017.  Symptoms have become considerably worse over the last few months, culminating in this admission.  Prior to admission the patient had occasional symptoms of resting shortness of breath with chronic orthopnea and occasional PND.  He has not had exertional chest pain or chest tightness.  Symptoms of shortness of breath have improved with medical therapy in the hospital.  The abdominal pain which he experienced at the time of admission resolved quickly and has not recurred.  He denies any fevers chills or productive cough.  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 The patient was hospitalized in February of this year with acute exacerbation of chronic systolic congestive heart failure. He was also diagnosed with small pulmonary embolus in the right lower lobe on CT angiogram, although imaging suggested that the pulmonary embolus was probably old. I had the opportunity to see the patient in consultation at that time (full consultation note dated March 04, 2017). His heart failure medications were optimized and since then the patient has been followed carefully in the advanced heart failure clinic.He was seen in follow-up by Dr. Aundra Dubin at which time he was taking all medications as directed and clinically remained stable, although he still reports significant exertional shortness of breath. He subsequently underwent TEE on April 19, 2017. TEE revealed mildly dilated left ventricle with ejection fraction estimated 35-40% in the setting of severe mitral regurgitation. There was inferolateral akinesis. There was severe mitral regurgitation with ERO measured 0.43 cm. Functional anatomy appeared consistent with classical type IIIb systolic restriction caused by infarct related mitral regurgitation with tethering of the posterior leaflet. There was no obvious reversal of flow in the pulmonary veins but there was severe systolic flattening. There was a small patent foramen ovale appreciated.  The patient was referred back to our office and I last saw him on April 28, 2017 at which time we discussed the indications, risk, and potential benefits of elective mitral valve repair or replacement for stage D severe symptomatic secondary mitral regurgitation which had failed optimal medical therapy.  At the time the patient decided he wanted to proceed with surgery but schedule it for some time in June.  He returns to our office today for follow-up.  He has been seen recently in the advanced heart failure clinic where he is being followed very closely.  He states that he still gets short of breath with low-level activity.  He denies any resting shortness of breath, PND, orthopnea, or lower extremity edema.  Weight has been up several pounds recently.  He has not had any chest pain or chest tightness.    Past Medical History:  Diagnosis Date  . Asthma   . CAD (coronary artery disease)   . Chronic systolic CHF (congestive heart failure) ([place])   . HLD (hyperlipidemia)    Qualifier: Diagnosis of  By: Percival Spanish, MD, Farrel Gordon    . Hypercholesteremia   . Inferior  myocardial infarction [facility]) 2007   PCI of RCA  . Inferior myocardial infarction ([place]) 03/31/2008   PCI of RCA with bare metal stent  . Ischemic cardiomyopathy   . Migraines    and dizziness  . Mitral regurgitation   . Obstructive sleep apnea    Qualifier: Diagnosis of  By:  Percival Spanish, MD, Farrel Gordon    . Posterolateral myocardial infarction ([place]) 10/20/2015   PCI of LCx using DES  . Pulmonary embolism ([place]) 02/28/2017  . Tobacco abuse     Past Surgical History:  Procedure Laterality Date  . CARDIAC CATHETERIZATION N/A 10/20/2015   Procedure: Left Heart Cath and Coronary Angiography;  Surgeon: Burnell Blanks, MD;  Location: [facility];  Service: Cardiovascular;  Laterality: N/A;  . CARDIAC CATHETERIZATION N/A 10/20/2015   Procedure: Coronary Stent Intervention;  Surgeon: Burnell Blanks, MD;  Location: [facility];  Service: Cardiovascular;  Laterality: N/A;  . INTRAVASCULAR PRESSURE WIRE/FFR STUDY N/A 03/03/2017   Procedure: INTRAVASCULAR PRESSURE WIRE/FFR STUDY;  Surgeon: Leonie Man, MD;  Location: [facility];  Service: Cardiovascular;  Laterality: N/A;  . LEFT HEART CATH AND CORONARY ANGIOGRAPHY N/A 03/03/2017   Procedure: LEFT HEART CATH AND CORONARY ANGIOGRAPHY;  Surgeon: Leonie Man, MD;  Location: [facility];  Service: Cardiovascular;  Laterality: N/A;  . MULTIPLE EXTRACTIONS WITH ALVEOLOPLASTY N/A 05/13/2017   Procedure: Extraction of tooth #'s 2,15,16,17 with alveoloplasty and gross debridement of remaining teeth.;  Surgeon: Lenn Cal, DDS;  Location: [facility];  Service: Oral Surgery;  Laterality: N/A;  . none    . OTHER SURGICAL HISTORY     NONE  . TEE WITHOUT CARDIOVERSION N/A 04/19/2017   Procedure: TRANSESOPHAGEAL ECHOCARDIOGRAM (TEE);  Surgeon: Larey Dresser, MD;  Location: [facility];  Service: Cardiovascular;  Laterality: N/A;    Family History  Problem Relation Age of Onset  . Other Sister 31       THE PATIENT REPORTS A SISTER WITH A MYOCARDIAL INFARCTION IN HER 30s.(SHE IS NOW S/P CABG)  . Heart attack Father   . Deep vein thrombosis Father     Social History Social History   Tobacco Use  . Smoking status: Former Smoker    Packs/day: 0.50    Years: 17.00    Pack years:  8.50    Types: Cigarettes  . Smokeless tobacco: Never Used  . Tobacco comment: quit since heart attack in 10/2015  Substance Use Topics  . Alcohol use: Not Currently  . Drug use: Not Currently    Types: Marijuana    Prior to Admission medications   Medication Sig Start Date End Date Taking? Authorizing Provider  atorvastatin (LIPITOR) 80 MG tablet Take 1 tablet (80 mg total) by mouth daily. 05/05/17  Yes Shirley Friar, PA-C  carvedilol (COREG) 3.125 MG tablet Take 1 tablet (3.125 mg total) by mouth 2 (two) times daily with a meal. 05/05/17  Yes Tillery, Satira Mccallum, PA-C  furosemide (LASIX) 20 MG tablet Take 1 tablet (20 mg total) by mouth daily. 05/19/17  Yes Shirley Friar, PA-C  losartan (COZAAR) 25 MG tablet Take 0.5 tablets (12.5 mg total) by mouth daily. Patient taking differently: Take 12.5 mg by mouth every evening.  05/05/17  Yes Shirley Friar, PA-C  Meclizine HCl 25 MG CHEW Chew 1 tablet (25 mg total) by mouth daily as needed. Patient taking differently: Chew 25 mg by mouth daily as needed (for dizziness/vertigo).  06/16/17  Yes  Shirley Friar, PA-C  nitroGLYCERIN (NITROSTAT) 0.4 MG SL tablet Place 1 tablet (0.4 mg total) under the tongue every 5 (five) minutes as needed for chest pain. 10/22/15  Yes Arbutus Leas, NP  spironolactone (ALDACTONE) 25 MG tablet Take 0.5 tablets (12.5 mg total) by mouth daily. 05/05/17  Yes Shirley Friar, PA-C  chlorhexidine (PERIDEX) 0.12 % solution Rinse with 15 mls twice daily for 30 seconds. Use after breakfast and at bedtime. Spit out excess. Do not swallow. Patient not taking: Reported on 06/30/2017 05/21/17   Lenn Cal, DDS  HYDROcodone-acetaminophen (NORCO) 5-325 MG tablet Take 1-2 tablets by mouth every 6 (six) hours as needed for moderate pain or severe pain. Patient not taking: Reported on 06/30/2017 05/13/17   Lenn Cal, DDS  rivaroxaban (XARELTO) 20 MG TABS tablet Take 1 tablet (20 mg  total) by mouth at bedtime. 05/05/17   Shirley Friar, PA-C    Allergies  Allergen Reactions  . Shellfish Allergy Anaphylaxis     Review of Systems:              General:                      normal appetite, normal energy, no weight gain, no weight loss, no fever             Cardiac:                       no chest pain with exertion, no chest pain at rest, + SOB with exertion, + resting SOB, + PND, + orthopnea, no palpitations, no arrhythmia, no atrial fibrillation, no LE edema, no dizzy spells, no syncope             Respiratory:                 + shortness of breath, no home oxygen, no productive cough, no dry cough, no bronchitis, no wheezing, no hemoptysis, no asthma, no pain with inspiration or cough, + sleep apnea, no CPAP at night             GI:                               no difficulty swallowing, no reflux, no frequent heartburn, no hiatal hernia, + abdominal pain, no constipation, no diarrhea, no hematochezia, no hematemesis, no melena             GU:                              no dysuria,  no frequency, no urinary tract infection, no hematuria, no enlarged prostate, no kidney stones, no kidney disease             Vascular:                     no pain suggestive of claudication, no pain in feet, no leg cramps, no varicose veins, no DVT, no non-healing foot ulcer             Neuro:                         no stroke, no TIA's, no seizures, no headaches, no temporary blindness one eye,  no slurred speech, no peripheral neuropathy, no  chronic pain, no instability of gait, no memory/cognitive dysfunction             Musculoskeletal:         no arthritis, no joint swelling, no myalgias, no difficulty walking, normal mobility              Skin:                            no rash, no itching, no skin infections, no pressure sores or ulcerations             Psych:                         no anxiety, no depression, no nervousness, no unusual recent stress             Eyes:                            no blurry vision, no floaters, no recent vision changes, does not wears glasses or contacts             ENT:                            no hearing loss, no loose or painful teeth but one broken molar, no dentures, last saw dentist many years ago             Hematologic:               no easy bruising, no abnormal bleeding, no clotting disorder, no frequent epistaxis             Endocrine:                   no diabetes, does not check CBG's at home                           Physical Exam:              BP 104/66 (BP Location: Right Arm)   Pulse 72   Temp 98.4 F (36.9 C) (Oral)   Resp 18   Ht 5' 11" (1.803 m)   Wt 222 lb 4.8 oz (100.8 kg) Comment: scale c  SpO2 99%   BMI 31.00 kg/m              General:                      Mildly obese,  well-appearing             HEENT:                       Unremarkable              Neck:                           no JVD, no bruits, no adenopathy              Chest:                          clear to auscultation, symmetrical breath sounds, no wheezes, no rhonchi  CV:                              RRR, soft grade III/VI systolic murmur              Abdomen:                    soft, non-tender, no masses              Extremities:                 warm, well-perfused, pulses palpable, no lower extremity edema             Rectal/GU                   Deferred             Neuro:                         Grossly non-focal and symmetrical throughout             Skin:                            Clean and dry, no rashes, no breakdown  Diagnostic Tests:  CT ANGIOGRAPHY CHEST WITH CONTRAST  TECHNIQUE: Multidetector CT imaging of the chest was performed using the standard protocol during bolus administration of intravenous contrast. Multiplanar CT image reconstructions and MIPs were obtained to evaluate the vascular anatomy.  CONTRAST: 142m ISOVUE-370 IOPAMIDOL (ISOVUE-370) INJECTION 76%  COMPARISON: Chest radiograph  performed 02/27/2017  FINDINGS: Cardiovascular: A small pulmonary infarct is suspected at the right lung base, raising concern for mild mostly resolved pulmonary embolus. No central pulmonary embolus is identified.  The heart is borderline enlarged. The thoracic aorta is not well assessed but appears grossly unremarkable. The great vessels are grossly unremarkable in appearance.  Mediastinum/Nodes: No mediastinal lymphadenopathy is seen. No pericardial effusion is identified. The visualized portions of the thyroid gland are unremarkable. No axillary lymphadenopathy is appreciated.  Lungs/Pleura: A small right pleural effusion is noted. The hazy wedge-shaped opacity at the right lung base is suspicious for pulmonary infarct. No pneumothorax is seen. The left lung appears clear. No masses are identified.  Upper Abdomen: The visualized portions of the liver and spleen are unremarkable. There is reflux of contrast into the hepatic veins and IVC.  Musculoskeletal: No acute osseous abnormalities are identified. The visualized musculature is unremarkable in appearance.  Review of the MIP images confirms the above findings.  IMPRESSION: 1. Small pulmonary infarct suspected at the right lung base, raising concern for mild mostly resolved pulmonary embolus. 2. Associated small right pleural effusion noted. 3. Borderline cardiomegaly.  Critical Value/emergent results were called by telephone at the time of interpretation on 02/28/2017 at 12:44 am to Dr. HDina Rich who verbally acknowledged these results.   Electronically Signed By: JGarald BaldingM.D. On: 02/28/2017 00:47   Transthoracic Echocardiography  Patient: BTyreak, ReagleMR #: [id number] Date: 03/01/2017 Gender: M Age: 66 Height: 180.3 cm Weight: 107.5 kg BSA: 2.35 m^2 Pt. Status: Room: 3E18C  PERFORMING [facility], Inpatient SONOGRAPHER TDarlina Sicilian RDCS ADMITTING [place], TWesleyvilleCDessa PhiChahn-Yang OChase Caller Jennifer Chahn-Yang REFERRING CDessa PhiChahn-Yang  cc:  ------------------------------------------------------------------- LV EF: 25% - 30%  ------------------------------------------------------------------- Indications: Dyspnea 786.09.  ------------------------------------------------------------------- History: PMH: Pulmonary Embolism / DVT. Coronary  artery disease. PMH: Myocardial infarction. Risk factors: Dyslipidemia.  ------------------------------------------------------------------- Study Conclusions  - Left ventricle: The cavity size was mildly dilated. Wall thickness was normal. Diffuse hypokinesis with inferolateral akinesis. Systolic function was severely reduced. The estimated ejection fraction was in the range of 25% to 30%. Doppler parameters are consistent with restrictive physiology, indicative of decreased left ventricular diastolic compliance and/or increased left atrial pressure. - Ventricular septum: D-shaped interventricular septum suggestive of RV pressure/volume overload. - Aortic valve: There was no stenosis. - Mitral valve: There was moderate to severe regurgitation, suspect infarct-related MR given inferolateral akinesis and restriction of posterior leaflet. - Left atrium: The atrium was mildly dilated. - Right ventricle: Poorly visualized. The cavity size was normal. Systolic function was mildly reduced. - Right atrium: The atrium was mildly dilated. - Tricuspid valve: Peak RV-RA gradient (S): 50 mm Hg. - Pulmonary arteries: PA peak pressure: 58 mm Hg (S). - Systemic veins: IVC measured 2.0 cm with < 50% respirophasic variation, suggesting RA pressure 8 mmHg.  Impressions:  - Mildly dilated LV with EF 25-30%, diffuse hypokinesis with inferolateral akinesis. Restrictive diastolic  function. RV poorly visualized but appears normal in size with mildly decreased systolic function. D-shaped interventricular septum is suggestive of RV pressure/volume overload. Moderate pulmonary hypertension. Moderate to severe mitral regurgitation, suspect infarct-related MR with restricted posterior leaflet and akinetic inferolateral wall.  ------------------------------------------------------------------- Study data: Comparison was made to the study of 10/21/2015. Study status: Routine. Procedure: The patient reported no pain pre or post test. Transthoracic echocardiography. Image quality was poor. The study was technically difficult, as a result of poor sound wave transmission. Intravenous contrast (Definity) was administered. Study completion: There were no complications. Transthoracic echocardiography. M-mode, complete 2D, spectral Doppler, and color Doppler. Birthdate: Patient birthdate: [birth year]. Age: Patient is 42 yr old. Sex: Gender: male. BMI: 33.1 kg/m^2. Blood pressure: 120/80 Patient status: Inpatient. Study date: Study date: 03/01/2017. Study time: 10:32 AM. Location: Bedside.  -------------------------------------------------------------------  ------------------------------------------------------------------- Left ventricle: The cavity size was mildly dilated. Wall thickness was normal. Diffuse hypokinesis with inferolateral akinesis. Systolic function was severely reduced. The estimated ejection fraction was in the range of 25% to 30%. Doppler parameters are consistent with restrictive physiology, indicative of decreased left ventricular diastolic compliance and/or increased left atrial pressure.  ------------------------------------------------------------------- Aortic valve: Trileaflet. Doppler: There was no stenosis. There was no  regurgitation.  ------------------------------------------------------------------- Aorta: Aortic root: The aortic root was normal in size. Ascending aorta: The ascending aorta was normal in size.  ------------------------------------------------------------------- Mitral valve: Mildly calcified leaflets . Doppler: There was no evidence for stenosis. There was moderate to severe regurgitation, suspect infarct-related MR given inferolateral akinesis and restriction of posterior leaflet. Peak gradient (D): 11 mm Hg.  ------------------------------------------------------------------- Left atrium: The atrium was mildly dilated.  ------------------------------------------------------------------- Right ventricle: Poorly visualized. The cavity size was normal. Systolic function was mildly reduced.  ------------------------------------------------------------------- Ventricular septum: D-shaped interventricular septum suggestive of RV pressure/volume overload.  ------------------------------------------------------------------- Pulmonic valve: Structurally normal valve. Cusp separation was normal. Doppler: Transvalvular velocity was within the normal range. There was trivial regurgitation.  ------------------------------------------------------------------- Tricuspid valve: Doppler: There was mild regurgitation.  ------------------------------------------------------------------- Right atrium: The atrium was mildly dilated.  ------------------------------------------------------------------- Pericardium: There was no pericardial effusion.  ------------------------------------------------------------------- Systemic veins: IVC measured 2.0 cm with <50% respirophasic variation, suggesting RA pressure 8 mmHg.  ------------------------------------------------------------------- Measurements  Left ventricle Value  Reference LV ID, ED, PLAX chordal (H) 56.1 mm 43 - 52 LV ID, ES, PLAX chordal (H) 49.7 mm 23 - 38 LV fx shortening, PLAX  chordal (L) 11 % >=29 LV PW thickness, ED 10.6 mm ---------- IVS/LV PW ratio, ED 1.08 <=1.3 Stroke volume, 2D 30 ml ---------- Stroke volume/bsa, 2D 13 ml/m^2 ---------- LV end-diastolic volume, 1-p D4K 876 ml ---------- LV ejection fraction, 1-p A4C 34 % ---------- LV end-diastolic volume/bsa, 1-p 84 ml/m^2 ---------- O1L LV end-diastolic volume, 2-p 572 ml ---------- LV end-systolic volume, 2-p 620 ml ---------- LV ejection fraction, 2-p 25 % ---------- Stroke volume, 2-p 47 ml ---------- LV end-diastolic volume/bsa, 2-p 79 ml/m^2 ---------- LV end-systolic volume/bsa, 2-p 59 ml/m^2 ---------- Stroke volume/bsa, 2-p 20 ml/m^2 ---------- LV e&', lateral 8.49 cm/s ---------- LV E/e&', lateral 19.79 ---------- LV e&', medial 7.29 cm/s ---------- LV E/e&', medial 23.05 ---------- LV e&', average 7.89 cm/s ---------- LV E/e&', average 21.29 ----------  Ventricular septum Value Reference IVS thickness, ED 11.5 mm ----------  LVOT Value Reference LVOT ID, S 17 mm ---------- LVOT area 2.27 cm^2  ---------- LVOT peak velocity, S 84.2 cm/s ---------- LVOT mean velocity, S 54.9 cm/s ---------- LVOT VTI, S 13 cm ----------  Aorta Value Reference Aortic root ID, ED 24 mm ----------  Left atrium Value Reference LA ID, A-P, ES 48 mm ---------- LA ID/bsa, A-P 2.04 cm/m^2 <=2.2 LA volume, S 55.6 ml ---------- LA volume/bsa, S 23.6 ml/m^2 ---------- LA volume, ES, 1-p A4C 54.1 ml ---------- LA volume/bsa, ES, 1-p A4C 23 ml/m^2 ---------- LA volume, ES, 1-p A2C 57.3 ml ---------- LA volume/bsa, ES, 1-p A2C 24.4 ml/m^2 ----------  Mitral valve Value Reference Mitral E-wave peak velocity 168 cm/s ---------- Mitral A-wave peak velocity 49.4 cm/s ---------- Mitral deceleration time (L) 127 ms 150 - 230 Mitral peak gradient, D 11 mm Hg ---------- Mitral E/A ratio, peak 3.4 ---------- Mitral regurg VTI, PISA 120 cm ---------- Mitral ERO, PISA 0.09 cm^2 ---------- Mitral regurg volume, PISA 11 ml ----------  Pulmonary arteries Value Reference PA pressure, S, DP (H) 58 mm Hg <=30  Tricuspid valve Value Reference Tricuspid regurg peak velocity 353 cm/s ---------- Tricuspid peak RV-RA gradient 50 mm Hg  ----------  Right atrium Value Reference RA ID, S-I, ES, A4C (H) 63.4 mm 34 - 49 RA area, ES, A4C (H) 22.2 cm^2 8.3 - 19.5 RA volume, ES, A/L 62.7 ml ---------- RA volume/bsa, ES, A/L 26.6 ml/m^2 ----------  Systemic veins Value Reference Estimated CVP 8 mm Hg ----------  Right ventricle Value Reference TAPSE 19.4 mm ---------- RV s&', lateral, S 14 cm/s ----------  Legend: (L) and (H) mark values outside specified reference range.  ------------------------------------------------------------------- Prepared and Electronically Authenticated by  Loralie Champagne, M.D. 2019-02-11T14:50:06    INTRAVASCULAR PRESSURE WIRE/FFR STUDY  LEFT HEART CATH AND CORONARY ANGIOGRAPHY  Conclusion     Previously placed Prox Cx to Mid Cx stent (unknown type) is widely patent.  Prox Cx lesion is 45% stenosed. - proximal to the Stent -- FFR 0.88.  Prox RCA to Mid RCA stent is 55% stenosed.  Dist LAD lesion is 20% stenosed.  LV end diastolic pressure is severely elevated. 30-31 mmHg  Angiographically moderate lesion prior to the circumflex stent which is likely just a step up. FFR 0.88. Otherwise minimal CAD. No change to the in-stent restenosis of the RCA.  Severely elevated EDP, likely related to cardiomyopathy and mitral regurgitation.  Plan: Medical management and consider possibility of mitral valve repair.   Discussed with Dr. Sallyanne Kuster.    Glenetta Hew, M.D., M.S. Interventional Cardiologist   Pager # [phone] Phone # [phone] [address] [place], [place] [address]  Indications   Ischemic cardiomyopathy  [I25.5 (ICD-10-CM)]  Non-rheumatic mitral regurgitation [I34.0 (ICD-10-CM)]  Coronary artery disease involving native coronary artery of native heart with angina pectoris (HCC) [Z61.096 (ICD-10-CM)]  Procedural Details/Technique   Technical Details PCP: Patient, No Pcp Per Primary Cardiologist: Dr Percival Spanish  Mr. Hoogendoorn is an unemployed 42 y/o AA male with a history of CAD dating back to 2007 when he had a PCI in [place]. In 2010 he presented with a STEMI and had an RCA DES placed. He followed up for a year but then didn't present again till Oct 2017 when he presented with a CFX infarct. Cath revealed 50% ISR of the RCA, total mCFX and 85% OM3. He had CFX PCI with DES and OM3 POBA. His EF then was 50-55%.   He presented to [facility] emergency room pain followed by chest pain. As part of his evaluation he has been found to have a PE but also significantly reduced ejection fraction with inferior inferolateral hypokinesis to akinesis. He is now referred for ischemic evaluation for ongoing intermittent chest discomfort and newly reduced EF which would suggest possible occlusion of his circumflex stent.  Time Out: Verified patient identification, verified procedure, site/side was marked, verified correct patient position, special equipment/implants available, medications/allergies/relevent history reviewed, required imaging and test results available. Performed. Consent Signed.   Access:  * RIGHT Radial Artery: 6 Fr sheath -- Seldinger technique using Angiocath Micropuncture Kit * 10 mL radial cocktail IA; 5500 Units IV Heparin   Left Heart Catheterization: 5 & 6Fr Catheters advanced or exchanged over a J-wire under direct fluoroscopic guidance into the ascending aorta; TIG 4.0 catheter advanced first.  * Left Coronary Artery Cineangiography: After several catheters including TIG 4.0, JL 3.5 and EBU 3.5 catheters, I finally ended using a 6 [place] XB LAD 3.5 guide catheter catheter  * Right Coronary Artery  Cineangiography: TIG 4.0 catheter  * LV Hemodynamics (no LV Gram): Angled pigtail catheter  After initial review of angiography, there did appear to be a slight step up in the circumflex just prior to the stent. On several images there was an appearance of a hazy possible significant lesion upstream and we therefore decided to evaluate with FFR.  Additional bolus of IV heparin was administered (5500 units). The 6 [place] XB LAD 3.5 guide catheter was reintroduced. A pro-water wire was used and the Microcatheter Navvus was used to measure FFR. Baseline FFR was 0.98. Final FFR 0.88. -IV adenosine was perfused at the standard rate of 140 mg/kilogram/min for a total of 2:15 min. The FFR catheter and wire were then removed completely out of the body.   - After completion of angiography, the catheter was removed completely out of the body over wire without complication.  Radial sheath(s) removed in the Cath Lab with TR band placed for hemostasis.   TR Band: 1050 Hours; 14 mL air  MEDICATIONS * SQ Lidocaine 77m * Radial Cocktail: 3 mg Verapamil in 10 mL NS * Isovue Contrast: 108 mL * Heparin: Total 11,000 units *IV adenosine infused at 140 mg/kg/min for total of 2: 15 min * IC NTG - n/a  Fluoro time: 16 minutes. Dose Area Product: 304540mGycm2. Cumulative Air Kerma: 596 mGy.    Estimated blood loss <50 mL.  During this procedure the patient was administered the following to achieve and maintain moderate conscious sedation: Versed 2 mg, Fentanyl 50 mcg, while the patient's heart rate, blood pressure, and oxygen saturation were continuously monitored. The period of conscious sedation  was 67 minutes, of which I was present face-to-face 100% of this time.  Complications   Complications documented before study signed (03/03/2017 11:22 AM EST)    No complications were associated with this study.  Documented by Leonie Man, MD - 03/03/2017 11:21 AM EST    Coronary Findings   Diagnostic   Dominance: Right  Left Anterior Descending  Vessel is large. Vessel is angiographically normal.  Dist LAD lesion 20% stenosed  Dist LAD lesion is 20% stenosed.  First Diagonal Branch  Vessel is large in size. Vessel is angiographically normal.  Second Diagonal Branch  Vessel is moderate in size. Vessel is angiographically normal.  Second Septal Branch  The vessel exhibits minimal luminal irregularities.  Third Diagonal Branch  Vessel is small in size.  Left Circumflex  Vessel is large.  Prox Cx lesion 45% stenosed  Prox Cx lesion is 45% stenosed. The lesion is discrete. Pre-stent Pressure wire/FFR was performed on the lesion. FFR: 0.88. Not physiologically significant  Prox Cx to Mid Cx lesion 0% stenosed  Previously placed Prox Cx to Mid Cx stent (unknown type) is widely patent.  First Obtuse Marginal Branch  Vessel is moderate in size.  Left Atrioventricular Groove Continuation  Vessel is small in size.  Right Coronary Artery  Prox RCA to Mid RCA lesion 55% stenosed  Prox RCA to Mid RCA lesion is 55% stenosed. diffuse The lesion was previously treated using a stent (unknown type) over 2 years ago. Previously placed stent displays restenosis.  Acute Marginal Branch  Vessel is small in size.  Right Posterior Descending Artery  Vessel is small in size.  Intervention   No interventions have been documented.  Wall Motion   No LV Gram        Left Heart   Left Ventricle LV end diastolic pressure is severely elevated.  Coronary Diagrams   Diagnostic Diagram       Implants        No implant documentation for this case.  MERGE Images   Show images for CARDIAC CATHETERIZATION   Link to Procedure Log   Procedure Log    Hemo Data    Most Recent Value  AO Systolic Pressure 893 mmHg  AO Diastolic Pressure 78 mmHg  AO Mean 89 mmHg  LV Systolic Pressure 734 mmHg  LV Diastolic Pressure 12 mmHg  LV EDP 32 mmHg  Arterial Occlusion Pressure Extended  Systolic Pressure 287 mmHg  Arterial Occlusion Pressure Extended Diastolic Pressure 95 mmHg  Arterial Occlusion Pressure Extended Mean Pressure 109 mmHg  Left Ventricular Apex Extended Systolic Pressure 681 mmHg  Left Ventricular Apex Extended Diastolic Pressure 19 mmHg  Left Ventricular Apex Extended EDP Pressure 35 mmHg     Transesophageal Echocardiography  Patient: Rachid, Parham MR #: [id number] Study Date: 04/19/2017 Gender: M Age: 71 Height: 180.3 cm Weight: 109.3 kg BSA: 2.37 m^2 Pt. Status: Room:  ADMITTING Loralie Champagne, M.D. ATTENDING Loralie Champagne, M.D. ORDERING Loralie Champagne, M.D. PERFORMING Loralie Champagne, M.D. REFERRING Loralie Champagne, M.D. SONOGRAPHER Mikki Santee  cc:  -------------------------------------------------------------------  ------------------------------------------------------------------- Indications: Mitral regurgitation 424.0.  ------------------------------------------------------------------- History: PMH: Coronary artery disease. Congestive heart failure. PMH: Myocardial infarction. Pulmonary embolus. Risk factors: Current tobacco use. Dyslipidemia.  ------------------------------------------------------------------- Study Conclusions  - Left ventricle: Mildly dilated left ventricle with EF 35-40%. Basal to mid inferior and inferolateral akinesis. - Aortic valve: There was no stenosis. - Aorta: Normal caliber thoracic aorta with minimal plaque. - Mitral valve: There was severe mitral regurgitation with PISA  ERO 0.43 cm^2. Suspect infarct-related MR with tethering of the posterior leaflet. There was flattening but not frank reversal of the pulmonary vein doppler pattern. - Left atrium: The atrium was mildly dilated. No evidence of thrombus in the atrial cavity or appendage. - Right ventricle: The cavity size was normal. Systolic  function was normal. - Right atrium: No evidence of thrombus in the atrial cavity or appendage. - Atrial septum: There was a small PFO by color doppler but bubble study was negative. - Tricuspid valve: Peak RV-RA gradient (S): 22 mm Hg.  Impressions:  - Severe infarct-related MR. EF better than it was on last echo. I will review with cardiac surgeon. As he still is symptomatic with exertional dyspnea, think he may benefit from MV repair.  ------------------------------------------------------------------- Study data: Study status: Routine. Consent: The risks, benefits, and alternatives to the procedure were explained to the patient and informed consent was obtained. Procedure: The patient reported no pain pre or post test. Initial setup. The patient was brought to the laboratory. Surface ECG leads were monitored. Sedation. Conscious sedation was administered by cardiology staff. Transesophageal echocardiography. Topical anesthesia was obtained using viscous lidocaine. A transesophageal probe was inserted by the attending cardiologist. 3D image quality was good. Intravenous contrast (agitated saline) was administered. Study completion: The patient tolerated the procedure well. There were no complications. Administered medications: Fentanyl, 58mg. Midazolam, 462m Diagnostic transesophageal echocardiography. 2D and color Doppler. Birthdate: Patient birthdate: 1112/07/1977Age: Patient is [age]r old. Sex: Gender: male. BMI: 33.6 kg/m^2. Blood pressure: 109/50 Patient status: Outpatient. Study date: Study date: 04/19/2017. Study time: 10:00 AM. Location: Endoscopy.  -------------------------------------------------------------------  ------------------------------------------------------------------- Left ventricle: Mildly dilated left ventricle with EF 35-40%. Basal to mid inferior and inferolateral akinesis. Wall  thickness was normal.  ------------------------------------------------------------------- Aortic valve: Trileaflet. Doppler: There was no stenosis. There was no regurgitation.  ------------------------------------------------------------------- Aorta: Normal caliber thoracic aorta with minimal plaque.  ------------------------------------------------------------------- Mitral valve: There was severe mitral regurgitation with PISA ERO 0.43 cm^2. Suspect infarct-related MR with tethering of the posterior leaflet. There was flattening but not frank reversal of the pulmonary vein doppler pattern. Doppler: There was no evidence for stenosis.  ------------------------------------------------------------------- Left atrium: The atrium was mildly dilated. No evidence of thrombus in the atrial cavity or appendage.  ------------------------------------------------------------------- Atrial septum: There was a small PFO by color doppler but bubble study was negative.  ------------------------------------------------------------------- Right ventricle: The cavity size was normal. Systolic function was normal.  ------------------------------------------------------------------- Pulmonic valve: Doppler: There was trivial regurgitation.  ------------------------------------------------------------------- Tricuspid valve: Doppler: There was trivial regurgitation.  ------------------------------------------------------------------- Right atrium: The atrium was normal in size. No evidence of thrombus in the atrial cavity or appendage.  ------------------------------------------------------------------- Pericardium: There was no pericardial effusion.  ------------------------------------------------------------------- Post procedure conclusions Ascending Aorta:  - Normal caliber thoracic aorta with minimal  plaque.  ------------------------------------------------------------------- Measurements  Mitral valve Value Mitral regurg VTI, PISA 179 cm Mitral ERO, PISA 0.43 cm^2 Mitral regurg volume, PISA 77 ml  Tricuspid valve Value Tricuspid regurg peak velocity 233 cm/s Tricuspid peak RV-RA gradient 22 mm Hg  Legend: (L) and (H) mark values outside specified reference range.  ------------------------------------------------------------------- Prepared and Electronically Authenticated by  DaLoralie ChampagneM.D. 2019-04-02T08:40:11  CT ANGIOGRAPHY CHEST, ABDOMEN AND PELVIS  TECHNIQUE: Multidetector CT imaging through the chest, abdomen and pelvis was performed using the standard protocol during bolus administration of intravenous contrast. Multiplanar reconstructed images and MIPs were obtained and reviewed to evaluate the vascular anatomy.  CONTRAST:  7598mSOVUE-370 IOPAMIDOL (ISOVUE-370) INJECTION 76%  COMPARISON:  CTA chest 02/28/2017  FINDINGS: CTA CHEST FINDINGS  Cardiovascular: 2 vessel aortic arch anatomy. The right brachiocephalic and left common carotid artery share a common origin. The aorta is normal in caliber. No evidence of aneurysm or dissection. Normal coronary artery anatomy. A metallic stent is present in the circumflex coronary artery. Mild cardiomegaly with left ventricular dilatation. No pericardial effusion. Normal caliber main pulmonary artery. No evidence of anomalous pulmonary venous return.  Mediastinum/Nodes: Unremarkable CT appearance of the thyroid gland. No suspicious mediastinal or hilar adenopathy. No soft tissue mediastinal mass. The thoracic esophagus is unremarkable.  Lungs/Pleura: Minimal paraseptal emphysema in the lung apices. Otherwise, the lungs are clear. No suspicious pulmonary nodule, pleural  effusion or pneumothorax.  Musculoskeletal: No acute fracture or aggressive appearing lytic or blastic osseous lesion.  Review of the MIP images confirms the above findings.  CTA ABDOMEN AND PELVIS FINDINGS  VASCULAR  Aorta: Minimal fibrofatty atherosclerotic plaque along the abdominal aorta. No evidence of aneurysm or dissection.  Celiac: Patent without evidence of aneurysm, dissection, vasculitis or significant stenosis.  SMA: Patent without evidence of aneurysm, dissection, vasculitis or significant stenosis.  Renals: Both renal arteries are patent without evidence of aneurysm, dissection, vasculitis, fibromuscular dysplasia or significant stenosis.  IMA: Patent without evidence of aneurysm, dissection, vasculitis or significant stenosis.  Inflow: Patent without evidence of aneurysm, dissection, vasculitis or significant stenosis.  Veins: No obvious venous abnormality within the limitations of this arterial phase study.  Review of the MIP images confirms the above findings.  NON-VASCULAR  Hepatobiliary: Normal hepatic contour and morphology. No discrete hepatic lesions. Normal appearance of the gallbladder. No intra or extrahepatic biliary ductal dilatation.  Pancreas: Unremarkable. No pancreatic ductal dilatation or surrounding inflammatory changes.  Spleen: Normal in size without focal abnormality.  Adrenals/Urinary Tract: Normal adrenal glands. Mild focal renal cortical scarring in the lateral aspect of the left upper pole, the anterior aspect of the left lower pole and the anterior aspect of the right lower pole. 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 No evidence of hydronephrosis, nephrolithiasis or enhancing renal mass. The ureters and bladder are unremarkable.  Stomach/Bowel: No evidence of obstruction or focal bowel wall thickening. Normal appendix in the right lower quadrant. The terminal ileum is  unremarkable.  Lymphatic: No suspicious lymphadenopathy.  Reproductive: Prostate is unremarkable.  Other: No abdominal wall hernia or abnormality. No abdominopelvic ascites.  Musculoskeletal: No acute fracture or aggressive appearing lytic or blastic osseous lesion.  Review of the MIP images confirms the above findings.  IMPRESSION: CTA CHEST  1. Cardiomegaly with left ventricular dilatation consistent with the clinical history of mitral valve insufficiency. 2. Mild paraseptal pulmonary emphysema.  Emphysema (ICD10-J43.9). 3. Left circumflex coronary artery stent. 4. No evidence of aortic aneurysm or dissection.  CTA ABD/PELVIS  1. No evidence of aneurysm, dissection or significant tortuosity or hemodynamically significant stenosis. 2. Mild fibrofatty atherosclerotic plaque along the abdominal aorta. Aortic Atherosclerosis (ICD10-170.0) 3. 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  Signed,  Criselda Peaches, MD  [facility]  [facility]   Electronically Signed   By: Jacqulynn Cadet M.D.   On: 06/22/2017 08:44    Impression:  Patient has long-standing history of premature coronary artery disease with multiple previous myocardial infarctions and ischemic cardiomyopathy with chronic systolic congestive heart failure. He has stage D severe symptomatic secondary mitral regurgitation with significant persistent symptoms of exertional shortness of breath despite optimal medical therapy.  I have personally reviewed the patient's recent transesophageal echocardiogram which reveals classical type IIIb mitral valve dysfunction. I agree the patient might benefit from mitral valve repair.   Risks associated with surgery should be acceptably low although undoubtedly influenced by the degree of left ventricular systolic dysfunction.  His left ventricle is  dilated and the degree of downward displacement of the mitral subvalvular apparatus appears significant.  It is possible that mitral valve replacement will be necessary.  The patient might be at risk for a variety of complications related to severe left ventricular dysfunction including respiratory failure, renal failure, need for prolonged under tropic support, or even the possibility of need for mechanical circulatory support.  However, given the patient's relatively young age I agree that surgical intervention seems quite reasonable.  The patient appears to be reasonable candidate for minimally invasive approach and CT angiography performed earlier today reveal no contraindication to peripheral cannulation for surgery.   Plan:  The patientwas againcounseled at length regarding the indications, risks and potential benefits of mitral valve repair or replacement. The rationale for elective surgery has been explained, including a comparison between surgery and continued medical therapy with close follow-up. Alternative surgical approaches have been discussed including a comparison between conventional sternotomy and minimally-invasive techniques.The patient hopes to avoid conventional median sternotomy if possible. Under the circumstances we would not plan elective surgical revascularization. I think this is reasonable, although it remains possible that the patient's right coronary artery disease may become clinically significant at some point in the not too distant future.   The likelihood of successful and durable valve repair has been discussed with particular reference to the findings of their recent echocardiogram.  Based upon these findings and previous experience, I have quoted them a greater than 50 percent likelihood of successful valve repair.  In the unlikely event that their valve cannot be successfully repaired, we discussed the possibility of replacing the mitral valve using a mechanical  prosthesis with the attendant need for long-term anticoagulation versus the alternative of replacing it using a bioprosthetic tissue valve with its potential for late structural valve deterioration and failure, depending upon the patient's longevity.  The patient specifically requests that if the mitral valve must be replaced that it be done using a mechanical valve.   The patient understands and accepts all potential risks of surgery including but not limited to risk of death, stroke or other neurologic complication, myocardial infarction, congestive heart failure, respiratory failure, renal failure, bleeding requiring transfusion and/or reexploration, arrhythmia, infection or other wound complications, pneumonia, pleural and/or pericardial effusion, pulmonary embolus, aortic dissection or other major vascular complication, or delayed complications related to valve repair or replacement including but not limited to structural valve deterioration and failure, thrombosis, embolization, endocarditis, or paravalvular leak.  All of his questions have been answered.  We plan to proceed with surgery on July 07, 2017.  Patient has been instructed to stop taking Xarelto 7 days prior to surgery.   I spent in excess of 15 minutes during the conduct of this office consultation and >50% of this time involved direct face-to-face encounter with the patient for counseling and/or coordination of their care.   Valentina Gu. Roxy Manns, MD 06/21/2017 2:55 PM

## 2017-07-07 ENCOUNTER — Inpatient Hospital Stay (HOSPITAL_COMMUNITY): Payer: Medicaid Other | Admitting: Vascular Surgery

## 2017-07-07 ENCOUNTER — Encounter (HOSPITAL_COMMUNITY): Payer: Self-pay | Admitting: Urology

## 2017-07-07 ENCOUNTER — Encounter (HOSPITAL_COMMUNITY)
Admission: RE | Disposition: A | Discharge status: Home / Self Care | Payer: Self-pay | Source: Ambulatory Visit | Attending: Thoracic Surgery (Cardiothoracic Vascular Surgery)

## 2017-07-07 ENCOUNTER — Inpatient Hospital Stay (HOSPITAL_COMMUNITY): Payer: Medicaid Other | Admitting: Certified Registered Nurse Anesthetist

## 2017-07-07 ENCOUNTER — Inpatient Hospital Stay (HOSPITAL_COMMUNITY)
Admission: RE | Admit: 2017-07-07 | Discharge: 2017-07-14 | DRG: 219 | Disposition: A | Discharge status: Home / Self Care | Payer: Medicaid Other | Source: Ambulatory Visit | Attending: Thoracic Surgery (Cardiothoracic Vascular Surgery) | Admitting: Thoracic Surgery (Cardiothoracic Vascular Surgery)

## 2017-07-07 ENCOUNTER — Inpatient Hospital Stay (HOSPITAL_COMMUNITY)
Admission: RE | Admit: 2017-07-07 | Discharge: 2017-07-07 | Disposition: A | Discharge status: Home / Self Care | Payer: Medicaid Other | Source: Ambulatory Visit | Attending: Thoracic Surgery (Cardiothoracic Vascular Surgery) | Admitting: Thoracic Surgery (Cardiothoracic Vascular Surgery)

## 2017-07-07 ENCOUNTER — Inpatient Hospital Stay (HOSPITAL_COMMUNITY): Payer: Medicaid Other

## 2017-07-07 DIAGNOSIS — Z8249 Family history of ischemic heart disease and other diseases of the circulatory system: Secondary | ICD-10-CM | POA: Diagnosis not present

## 2017-07-07 DIAGNOSIS — E785 Hyperlipidemia, unspecified: Secondary | ICD-10-CM | POA: Diagnosis present

## 2017-07-07 DIAGNOSIS — D62 Acute posthemorrhagic anemia: Secondary | ICD-10-CM | POA: Diagnosis not present

## 2017-07-07 DIAGNOSIS — E669 Obesity, unspecified: Secondary | ICD-10-CM | POA: Diagnosis present

## 2017-07-07 DIAGNOSIS — E876 Hypokalemia: Secondary | ICD-10-CM | POA: Diagnosis not present

## 2017-07-07 DIAGNOSIS — Z9889 Other specified postprocedural states: Secondary | ICD-10-CM

## 2017-07-07 DIAGNOSIS — D6959 Other secondary thrombocytopenia: Secondary | ICD-10-CM | POA: Diagnosis not present

## 2017-07-07 DIAGNOSIS — I25119 Atherosclerotic heart disease of native coronary artery with unspecified angina pectoris: Secondary | ICD-10-CM | POA: Diagnosis present

## 2017-07-07 DIAGNOSIS — Z79899 Other long term (current) drug therapy: Secondary | ICD-10-CM

## 2017-07-07 DIAGNOSIS — Z560 Unemployment, unspecified: Secondary | ICD-10-CM

## 2017-07-07 DIAGNOSIS — Z91013 Allergy to seafood: Secondary | ICD-10-CM | POA: Diagnosis not present

## 2017-07-07 DIAGNOSIS — R000 Tachycardia, unspecified: Secondary | ICD-10-CM | POA: Diagnosis not present

## 2017-07-07 DIAGNOSIS — I252 Old myocardial infarction: Secondary | ICD-10-CM | POA: Diagnosis not present

## 2017-07-07 DIAGNOSIS — Q211 Atrial septal defect: Secondary | ICD-10-CM

## 2017-07-07 DIAGNOSIS — Z87891 Personal history of nicotine dependence: Secondary | ICD-10-CM

## 2017-07-07 DIAGNOSIS — J939 Pneumothorax, unspecified: Secondary | ICD-10-CM | POA: Diagnosis not present

## 2017-07-07 DIAGNOSIS — T502X5A Adverse effect of carbonic-anhydrase inhibitors, benzothiadiazides and other diuretics, initial encounter: Secondary | ICD-10-CM | POA: Diagnosis not present

## 2017-07-07 DIAGNOSIS — Z955 Presence of coronary angioplasty implant and graft: Secondary | ICD-10-CM

## 2017-07-07 DIAGNOSIS — I361 Nonrheumatic tricuspid (valve) insufficiency: Secondary | ICD-10-CM | POA: Diagnosis not present

## 2017-07-07 DIAGNOSIS — I5022 Chronic systolic (congestive) heart failure: Secondary | ICD-10-CM | POA: Diagnosis not present

## 2017-07-07 DIAGNOSIS — I2510 Atherosclerotic heart disease of native coronary artery without angina pectoris: Secondary | ICD-10-CM | POA: Diagnosis present

## 2017-07-07 DIAGNOSIS — Z7901 Long term (current) use of anticoagulants: Secondary | ICD-10-CM | POA: Diagnosis not present

## 2017-07-07 DIAGNOSIS — I340 Nonrheumatic mitral (valve) insufficiency: Secondary | ICD-10-CM | POA: Diagnosis present

## 2017-07-07 DIAGNOSIS — D6862 Lupus anticoagulant syndrome: Secondary | ICD-10-CM | POA: Diagnosis present

## 2017-07-07 DIAGNOSIS — Z6831 Body mass index (BMI) 31.0-31.9, adult: Secondary | ICD-10-CM

## 2017-07-07 DIAGNOSIS — I509 Heart failure, unspecified: Secondary | ICD-10-CM

## 2017-07-07 DIAGNOSIS — Z86711 Personal history of pulmonary embolism: Secondary | ICD-10-CM

## 2017-07-07 DIAGNOSIS — J9811 Atelectasis: Secondary | ICD-10-CM | POA: Diagnosis not present

## 2017-07-07 DIAGNOSIS — I5023 Acute on chronic systolic (congestive) heart failure: Secondary | ICD-10-CM | POA: Diagnosis present

## 2017-07-07 DIAGNOSIS — G4733 Obstructive sleep apnea (adult) (pediatric): Secondary | ICD-10-CM | POA: Diagnosis present

## 2017-07-07 DIAGNOSIS — I255 Ischemic cardiomyopathy: Secondary | ICD-10-CM | POA: Diagnosis present

## 2017-07-07 HISTORY — PX: PATENT FORAMEN OVALE(PFO) CLOSURE: CATH118300

## 2017-07-07 HISTORY — DX: Other specified postprocedural states: Z98.890

## 2017-07-07 HISTORY — PX: MITRAL VALVE REPAIR: SHX2039

## 2017-07-07 HISTORY — PX: TEE WITHOUT CARDIOVERSION: SHX5443

## 2017-07-07 LAB — POCT I-STAT 3, ART BLOOD GAS (G3+)
ACID-BASE DEFICIT: 1 mmol/L (ref 0.0–2.0)
ACID-BASE DEFICIT: 7 mmol/L — AB (ref 0.0–2.0)
ACID-BASE DEFICIT: 7 mmol/L — AB (ref 0.0–2.0)
ACID-BASE DEFICIT: 6 mmol/L — AB (ref 0.0–2.0)
ACID-BASE DEFICIT: 5 mmol/L — AB (ref 0.0–2.0)
ACID-BASE DEFICIT: 4 mmol/L — AB (ref 0.0–2.0)
ACID-BASE DEFICIT: 4 mmol/L — AB (ref 0.0–2.0)
Acid-base deficit: 4 mmol/L — ABNORMAL HIGH (ref 0.0–2.0)
BICARBONATE: 25.7 mmol/L (ref 20.0–28.0)
BICARBONATE: 19.2 mmol/L — AB (ref 20.0–28.0)
BICARBONATE: 21.7 mmol/L (ref 20.0–28.0)
BICARBONATE: 21.9 mmol/L (ref 20.0–28.0)
Bicarbonate: 20.6 mmol/L (ref 20.0–28.0)
Bicarbonate: 21.9 mmol/L (ref 20.0–28.0)
Bicarbonate: 21.5 mmol/L (ref 20.0–28.0)
Bicarbonate: 22 mmol/L (ref 20.0–28.0)
O2 SAT: 100 %
O2 SAT: 98 %
O2 SAT: 92 %
O2 SAT: 94 %
O2 SAT: 96 %
O2 Saturation: 91 %
O2 Saturation: 99 %
O2 Saturation: 95 %
PCO2 ART: 41.7 mmHg (ref 32.0–48.0)
PCO2 ART: 44.6 mmHg (ref 32.0–48.0)
PH ART: 7.326 — AB (ref 7.350–7.450)
PO2 ART: 77 mmHg — AB (ref 83.0–108.0)
PO2 ART: 74 mmHg — AB (ref 83.0–108.0)
PO2 ART: 95 mmHg (ref 83.0–108.0)
PO2 ART: 86 mmHg (ref 83.0–108.0)
Patient temperature: 37.2
TCO2: 27 mmol/L (ref 22–32)
TCO2: 20 mmol/L — ABNORMAL LOW (ref 22–32)
TCO2: 22 mmol/L (ref 22–32)
TCO2: 23 mmol/L (ref 22–32)
TCO2: 23 mmol/L (ref 22–32)
TCO2: 23 mmol/L (ref 22–32)
TCO2: 23 mmol/L (ref 22–32)
TCO2: 23 mmol/L (ref 22–32)
pCO2 arterial: 50.9 mmHg — ABNORMAL HIGH (ref 32.0–48.0)
pCO2 arterial: 39.9 mmHg (ref 32.0–48.0)
pCO2 arterial: 53.6 mmHg — ABNORMAL HIGH (ref 32.0–48.0)
pCO2 arterial: 48.3 mmHg — ABNORMAL HIGH (ref 32.0–48.0)
pCO2 arterial: 43.8 mmHg (ref 32.0–48.0)
pCO2 arterial: 45.4 mmHg (ref 32.0–48.0)
pH, Arterial: 7.311 — ABNORMAL LOW (ref 7.350–7.450)
pH, Arterial: 7.193 — CL (ref 7.350–7.450)
pH, Arterial: 7.29 — ABNORMAL LOW (ref 7.350–7.450)
pH, Arterial: 7.257 — ABNORMAL LOW (ref 7.350–7.450)
pH, Arterial: 7.291 — ABNORMAL LOW (ref 7.350–7.450)
pH, Arterial: 7.312 — ABNORMAL LOW (ref 7.350–7.450)
pH, Arterial: 7.3 — ABNORMAL LOW (ref 7.350–7.450)
pO2, Arterial: 339 mmHg — ABNORMAL HIGH (ref 83.0–108.0)
pO2, Arterial: 122 mmHg — ABNORMAL HIGH (ref 83.0–108.0)
pO2, Arterial: 167 mmHg — ABNORMAL HIGH (ref 83.0–108.0)
pO2, Arterial: 79 mmHg — ABNORMAL LOW (ref 83.0–108.0)

## 2017-07-07 LAB — POCT I-STAT, CHEM 8
BUN: 13 mg/dL (ref 6–20)
BUN: 13 mg/dL (ref 6–20)
BUN: 12 mg/dL (ref 6–20)
BUN: 11 mg/dL (ref 6–20)
BUN: 11 mg/dL (ref 6–20)
CALCIUM ION: 1.26 mmol/L (ref 1.15–1.40)
CALCIUM ION: 1.04 mmol/L — AB (ref 1.15–1.40)
CALCIUM ION: 1.27 mmol/L (ref 1.15–1.40)
CALCIUM ION: 1.19 mmol/L (ref 1.15–1.40)
CHLORIDE: 103 mmol/L (ref 101–111)
CREATININE: 0.9 mg/dL (ref 0.61–1.24)
CREATININE: 0.8 mg/dL (ref 0.61–1.24)
CREATININE: 0.8 mg/dL (ref 0.61–1.24)
CREATININE: 0.9 mg/dL (ref 0.61–1.24)
Calcium, Ion: 1.21 mmol/L (ref 1.15–1.40)
Chloride: 104 mmol/L (ref 101–111)
Chloride: 98 mmol/L — ABNORMAL LOW (ref 101–111)
Chloride: 106 mmol/L (ref 101–111)
Chloride: 104 mmol/L (ref 101–111)
Creatinine, Ser: 0.9 mg/dL (ref 0.61–1.24)
GLUCOSE: 128 mg/dL — AB (ref 65–99)
Glucose, Bld: 105 mg/dL — ABNORMAL HIGH (ref 65–99)
Glucose, Bld: 155 mg/dL — ABNORMAL HIGH (ref 65–99)
Glucose, Bld: 139 mg/dL — ABNORMAL HIGH (ref 65–99)
Glucose, Bld: 151 mg/dL — ABNORMAL HIGH (ref 65–99)
HCT: 35 % — ABNORMAL LOW (ref 39.0–52.0)
HCT: 25 % — ABNORMAL LOW (ref 39.0–52.0)
HCT: 25 % — ABNORMAL LOW (ref 39.0–52.0)
HEMATOCRIT: 36 % — AB (ref 39.0–52.0)
HEMATOCRIT: 32 % — AB (ref 39.0–52.0)
HEMOGLOBIN: 8.5 g/dL — AB (ref 13.0–17.0)
HEMOGLOBIN: 10.9 g/dL — AB (ref 13.0–17.0)
Hemoglobin: 11.9 g/dL — ABNORMAL LOW (ref 13.0–17.0)
Hemoglobin: 12.2 g/dL — ABNORMAL LOW (ref 13.0–17.0)
Hemoglobin: 8.5 g/dL — ABNORMAL LOW (ref 13.0–17.0)
POTASSIUM: 4.7 mmol/L (ref 3.5–5.1)
Potassium: 3.8 mmol/L (ref 3.5–5.1)
Potassium: 4.7 mmol/L (ref 3.5–5.1)
Potassium: 4.8 mmol/L (ref 3.5–5.1)
Potassium: 4.6 mmol/L (ref 3.5–5.1)
SODIUM: 142 mmol/L (ref 135–145)
Sodium: 143 mmol/L (ref 135–145)
Sodium: 141 mmol/L (ref 135–145)
Sodium: 138 mmol/L (ref 135–145)
Sodium: 140 mmol/L (ref 135–145)
TCO2: 28 mmol/L (ref 22–32)
TCO2: 27 mmol/L (ref 22–32)
TCO2: 26 mmol/L (ref 22–32)
TCO2: 21 mmol/L — AB (ref 22–32)
TCO2: 23 mmol/L (ref 22–32)

## 2017-07-07 LAB — POCT I-STAT 7, (LYTES, BLD GAS, ICA,H+H)
Acid-Base Excess: 1 mmol/L (ref 0.0–2.0)
Bicarbonate: 28 mmol/L (ref 20.0–28.0)
CALCIUM ION: 1.23 mmol/L (ref 1.15–1.40)
HEMATOCRIT: 36 % — AB (ref 39.0–52.0)
HEMOGLOBIN: 12.2 g/dL — AB (ref 13.0–17.0)
O2 SAT: 96 %
POTASSIUM: 3.9 mmol/L (ref 3.5–5.1)
Patient temperature: 36
SODIUM: 143 mmol/L (ref 135–145)
TCO2: 30 mmol/L (ref 22–32)
pCO2 arterial: 50.1 mmHg — ABNORMAL HIGH (ref 32.0–48.0)
pH, Arterial: 7.351 (ref 7.350–7.450)
pO2, Arterial: 82 mmHg — ABNORMAL LOW (ref 83.0–108.0)

## 2017-07-07 LAB — CBC
HEMATOCRIT: 37.5 % — AB (ref 39.0–52.0)
HEMATOCRIT: 32.4 % — AB (ref 39.0–52.0)
HEMOGLOBIN: 11.7 g/dL — AB (ref 13.0–17.0)
HEMOGLOBIN: 10.4 g/dL — AB (ref 13.0–17.0)
MCH: 29.6 pg (ref 26.0–34.0)
MCH: 30 pg (ref 26.0–34.0)
MCHC: 31.2 g/dL (ref 30.0–36.0)
MCHC: 32.1 g/dL (ref 30.0–36.0)
MCV: 94.9 fL (ref 78.0–100.0)
MCV: 93.4 fL (ref 78.0–100.0)
Platelets: 134 10*3/uL — ABNORMAL LOW (ref 150–400)
Platelets: 126 10*3/uL — ABNORMAL LOW (ref 150–400)
RBC: 3.95 MIL/uL — AB (ref 4.22–5.81)
RBC: 3.47 MIL/uL — ABNORMAL LOW (ref 4.22–5.81)
RDW: 12.1 % (ref 11.5–15.5)
RDW: 12.1 % (ref 11.5–15.5)
WBC: 14.4 10*3/uL — AB (ref 4.0–10.5)
WBC: 12.7 10*3/uL — AB (ref 4.0–10.5)

## 2017-07-07 LAB — HEMOGLOBIN AND HEMATOCRIT, BLOOD
HCT: 27.5 % — ABNORMAL LOW (ref 39.0–52.0)
Hemoglobin: 8.9 g/dL — ABNORMAL LOW (ref 13.0–17.0)

## 2017-07-07 LAB — MAGNESIUM: Magnesium: 2.5 mg/dL — ABNORMAL HIGH (ref 1.7–2.4)

## 2017-07-07 LAB — COOXEMETRY PANEL
CARBOXYHEMOGLOBIN: 0.8 % (ref 0.5–1.5)
METHEMOGLOBIN: 1.6 % — AB (ref 0.0–1.5)
O2 SAT: 46.1 %
TOTAL HEMOGLOBIN: 11.9 g/dL — AB (ref 12.0–16.0)

## 2017-07-07 LAB — GLUCOSE, CAPILLARY
GLUCOSE-CAPILLARY: 78 mg/dL (ref 65–99)
GLUCOSE-CAPILLARY: 118 mg/dL — AB (ref 65–99)
GLUCOSE-CAPILLARY: 121 mg/dL — AB (ref 65–99)
GLUCOSE-CAPILLARY: 123 mg/dL — AB (ref 65–99)
Glucose-Capillary: 117 mg/dL — ABNORMAL HIGH (ref 65–99)
Glucose-Capillary: 130 mg/dL — ABNORMAL HIGH (ref 65–99)
Glucose-Capillary: 144 mg/dL — ABNORMAL HIGH (ref 65–99)

## 2017-07-07 LAB — CREATININE, SERUM: CREATININE: 0.99 mg/dL (ref 0.61–1.24)

## 2017-07-07 LAB — POCT I-STAT 4, (NA,K, GLUC, HGB,HCT)
Glucose, Bld: 122 mg/dL — ABNORMAL HIGH (ref 65–99)
HCT: 35 % — ABNORMAL LOW (ref 39.0–52.0)
Hemoglobin: 11.9 g/dL — ABNORMAL LOW (ref 13.0–17.0)
POTASSIUM: 4.5 mmol/L (ref 3.5–5.1)
SODIUM: 141 mmol/L (ref 135–145)

## 2017-07-07 LAB — PLATELET COUNT: Platelets: 158 10*3/uL (ref 150–400)

## 2017-07-07 SURGERY — REPAIR, MITRAL VALVE, MINIMALLY INVASIVE
Anesthesia: General | Site: Chest | Laterality: Right

## 2017-07-07 MED ORDER — SODIUM BICARBONATE 8.4 % IV SOLN
50.0000 meq | Freq: Once | INTRAVENOUS | Status: AC
  Administered 2017-07-07: 50 meq via INTRAVENOUS

## 2017-07-07 MED ORDER — 0.9 % SODIUM CHLORIDE (POUR BTL) OPTIME
TOPICAL | Status: DC | PRN
  Administered 2017-07-07: 1000 mL

## 2017-07-07 MED ORDER — CHLORHEXIDINE GLUCONATE 0.12% ORAL RINSE (MEDLINE KIT)
15.0000 mL | Freq: Two times a day (BID) | OROMUCOSAL | Status: DC
  Administered 2017-07-07: 15 mL via OROMUCOSAL

## 2017-07-07 MED ORDER — VANCOMYCIN HCL IN DEXTROSE 1-5 GM/200ML-% IV SOLN
1000.0000 mg | Freq: Once | INTRAVENOUS | Status: AC
  Administered 2017-07-07: 1000 mg via INTRAVENOUS
  Filled 2017-07-07: qty 200

## 2017-07-07 MED ORDER — MORPHINE SULFATE (PF) 2 MG/ML IV SOLN
1.0000 mg | INTRAVENOUS | Status: DC | PRN
  Administered 2017-07-07 – 2017-07-08 (×8): 2 mg via INTRAVENOUS
  Filled 2017-07-07 (×8): qty 1

## 2017-07-07 MED ORDER — ACETAMINOPHEN 160 MG/5ML PO SOLN: 1000.0000 mg | Freq: Four times a day (QID) | ORAL | Status: DC

## 2017-07-07 MED ORDER — OXYCODONE HCL 5 MG PO TABS
5.0000 mg | ORAL_TABLET | ORAL | Status: DC | PRN
  Administered 2017-07-08 (×5): 10 mg via ORAL
  Filled 2017-07-07 (×6): qty 2

## 2017-07-07 MED ORDER — LACTATED RINGERS IV SOLN
INTRAVENOUS | Status: DC | PRN
  Administered 2017-07-07: 08:00:00 via INTRAVENOUS

## 2017-07-07 MED ORDER — SODIUM CHLORIDE 0.9 % IV SOLN
INTRAVENOUS | Status: DC
  Administered 2017-07-07: 16:00:00 via INTRAVENOUS

## 2017-07-07 MED ORDER — BISACODYL 10 MG RE SUPP: 10.0000 mg | Freq: Every day | RECTAL | Status: DC

## 2017-07-07 MED ORDER — DOCUSATE SODIUM 100 MG PO CAPS: 200.0000 mg | ORAL_CAPSULE | Freq: Every day | ORAL | Status: DC

## 2017-07-07 MED ORDER — METOPROLOL TARTRATE 5 MG/5ML IV SOLN
2.5000 mg | INTRAVENOUS | Status: DC | PRN
  Administered 2017-07-07: 2.5 mg via INTRAVENOUS

## 2017-07-07 MED ORDER — MAGNESIUM SULFATE 4 GM/100ML IV SOLN
INTRAVENOUS | Status: AC
  Filled 2017-07-07: qty 100

## 2017-07-07 MED ORDER — METOPROLOL TARTRATE 12.5 MG HALF TABLET: 12.5000 mg | ORAL_TABLET | Freq: Once | ORAL | Status: DC

## 2017-07-07 MED ORDER — HEPARIN SODIUM (PORCINE) 1000 UNIT/ML IJ SOLN
INTRAMUSCULAR | Status: DC | PRN
  Administered 2017-07-07: 38000 [IU] via INTRAVENOUS

## 2017-07-07 MED ORDER — LACTATED RINGERS IV SOLN: 500.0000 mL | Freq: Once | INTRAVENOUS | Status: DC | PRN

## 2017-07-07 MED ORDER — FAMOTIDINE IN NACL 20-0.9 MG/50ML-% IV SOLN
20.0000 mg | Freq: Two times a day (BID) | INTRAVENOUS | Status: DC
  Administered 2017-07-07: 20 mg via INTRAVENOUS

## 2017-07-07 MED ORDER — MIDAZOLAM HCL 2 MG/2ML IJ SOLN: 2.0000 mg | INTRAMUSCULAR | Status: DC | PRN

## 2017-07-07 MED ORDER — TRANEXAMIC ACID 1000 MG/10ML IV SOLN
1.5000 mg/kg/h | INTRAVENOUS | Status: DC
  Filled 2017-07-07: qty 25

## 2017-07-07 MED ORDER — SODIUM CHLORIDE 0.9 % IV SOLN
250.0000 mL | INTRAVENOUS | Status: DC
  Administered 2017-07-09: 250 mL via INTRAVENOUS

## 2017-07-07 MED ORDER — SODIUM CHLORIDE 0.9% FLUSH
3.0000 mL | Freq: Two times a day (BID) | INTRAVENOUS | Status: DC
  Administered 2017-07-08 – 2017-07-13 (×7): 3 mL via INTRAVENOUS

## 2017-07-07 MED ORDER — PROPOFOL 10 MG/ML IV BOLUS
INTRAVENOUS | Status: AC
  Filled 2017-07-07: qty 20

## 2017-07-07 MED ORDER — ALBUMIN HUMAN 5 % IV SOLN
250.0000 mL | INTRAVENOUS | Status: AC | PRN
  Administered 2017-07-07 (×2): 250 mL via INTRAVENOUS

## 2017-07-07 MED ORDER — LACTATED RINGERS IV SOLN
INTRAVENOUS | Status: DC
  Administered 2017-07-07: 16:00:00 via INTRAVENOUS

## 2017-07-07 MED ORDER — PROTAMINE SULFATE 10 MG/ML IV SOLN
INTRAVENOUS | Status: DC | PRN
  Administered 2017-07-07: 340 mg via INTRAVENOUS
  Administered 2017-07-07: 10 mg via INTRAVENOUS

## 2017-07-07 MED ORDER — CHLORHEXIDINE GLUCONATE 4 % EX LIQD: 30.0000 mL | CUTANEOUS | Status: DC

## 2017-07-07 MED ORDER — ASPIRIN EC 325 MG PO TBEC
325.0000 mg | DELAYED_RELEASE_TABLET | Freq: Every day | ORAL | Status: DC
  Administered 2017-07-08: 325 mg via ORAL
  Filled 2017-07-07: qty 1

## 2017-07-07 MED ORDER — BISACODYL 5 MG PO TBEC
10.0000 mg | DELAYED_RELEASE_TABLET | Freq: Every day | ORAL | Status: DC
  Administered 2017-07-08 – 2017-07-10 (×3): 10 mg via ORAL
  Filled 2017-07-07 (×3): qty 2

## 2017-07-07 MED ORDER — PROTAMINE SULFATE 10 MG/ML IV SOLN
INTRAVENOUS | Status: AC
  Filled 2017-07-07: qty 25

## 2017-07-07 MED ORDER — CHLORHEXIDINE GLUCONATE 0.12 % MT SOLN
OROMUCOSAL | Status: AC
  Administered 2017-07-07: 15 mL via OROMUCOSAL
  Filled 2017-07-07: qty 15

## 2017-07-07 MED ORDER — SODIUM CHLORIDE 0.9 % IV SOLN: INTRAVENOUS | Status: DC

## 2017-07-07 MED ORDER — SODIUM CHLORIDE 0.9 % IV SOLN
INTRAVENOUS | Status: DC
  Administered 2017-07-07: 2.5 [IU]/h via INTRAVENOUS

## 2017-07-07 MED ORDER — ROCURONIUM BROMIDE 50 MG/5ML IV SOLN
INTRAVENOUS | Status: AC
  Filled 2017-07-07: qty 5

## 2017-07-07 MED ORDER — HEPARIN SODIUM (PORCINE) 1000 UNIT/ML IJ SOLN
INTRAMUSCULAR | Status: AC
  Filled 2017-07-07: qty 1

## 2017-07-07 MED ORDER — PROPOFOL 10 MG/ML IV BOLUS
INTRAVENOUS | Status: DC | PRN
  Administered 2017-07-07: 120 mg via INTRAVENOUS

## 2017-07-07 MED ORDER — EPHEDRINE SULFATE 50 MG/ML IJ SOLN
INTRAMUSCULAR | Status: AC
  Filled 2017-07-07: qty 1

## 2017-07-07 MED ORDER — SODIUM CHLORIDE 0.9% FLUSH: 10.0000 mL | INTRAVENOUS | Status: DC | PRN

## 2017-07-07 MED ORDER — FENTANYL CITRATE (PF) 250 MCG/5ML IJ SOLN
INTRAMUSCULAR | Status: AC
  Filled 2017-07-07: qty 25

## 2017-07-07 MED ORDER — ASPIRIN 81 MG PO CHEW: 324.0000 mg | CHEWABLE_TABLET | Freq: Every day | ORAL | Status: DC

## 2017-07-07 MED ORDER — MILRINONE LACTATE IN DEXTROSE 20-5 MG/100ML-% IV SOLN
0.1250 ug/kg/min | INTRAVENOUS | Status: DC
  Administered 2017-07-07 (×2): 0.375 ug/kg/min via INTRAVENOUS
  Administered 2017-07-08: 0.25 ug/kg/min via INTRAVENOUS
  Administered 2017-07-08 (×2): 0.375 ug/kg/min via INTRAVENOUS
  Administered 2017-07-09: 0.25 ug/kg/min via INTRAVENOUS
  Administered 2017-07-09: 0.125 ug/kg/min via INTRAVENOUS
  Filled 2017-07-07 (×5): qty 100

## 2017-07-07 MED ORDER — TRAMADOL HCL 50 MG PO TABS
50.0000 mg | ORAL_TABLET | ORAL | Status: DC | PRN
  Administered 2017-07-08 – 2017-07-13 (×9): 100 mg via ORAL
  Filled 2017-07-07 (×9): qty 2

## 2017-07-07 MED ORDER — DEXMEDETOMIDINE HCL IN NACL 200 MCG/50ML IV SOLN
INTRAVENOUS | Status: AC
  Filled 2017-07-07: qty 50

## 2017-07-07 MED ORDER — SODIUM CHLORIDE 0.9 % IV SOLN
1.5000 g | Freq: Two times a day (BID) | INTRAVENOUS | Status: AC
  Administered 2017-07-07 – 2017-07-09 (×4): 1.5 g via INTRAVENOUS
  Filled 2017-07-07 (×4): qty 1.5

## 2017-07-07 MED ORDER — METOPROLOL TARTRATE 12.5 MG HALF TABLET
12.5000 mg | ORAL_TABLET | Freq: Two times a day (BID) | ORAL | Status: DC
Start: 2017-07-07 — End: 2017-07-08

## 2017-07-07 MED ORDER — FENTANYL CITRATE (PF) 250 MCG/5ML IJ SOLN
INTRAMUSCULAR | Status: AC
  Filled 2017-07-07: qty 5

## 2017-07-07 MED ORDER — MIDAZOLAM HCL 5 MG/5ML IJ SOLN
INTRAMUSCULAR | Status: DC | PRN
  Administered 2017-07-07: 3 mg via INTRAVENOUS
  Administered 2017-07-07 (×2): 2 mg via INTRAVENOUS
  Administered 2017-07-07: 3 mg via INTRAVENOUS

## 2017-07-07 MED ORDER — MAGNESIUM SULFATE 4 GM/100ML IV SOLN
4.0000 g | Freq: Once | INTRAVENOUS | Status: AC
  Administered 2017-07-07: 4 g via INTRAVENOUS

## 2017-07-07 MED ORDER — ACETAMINOPHEN 500 MG PO TABS
1000.0000 mg | ORAL_TABLET | Freq: Four times a day (QID) | ORAL | Status: AC
  Administered 2017-07-07 – 2017-07-12 (×20): 1000 mg via ORAL
  Filled 2017-07-07 (×18): qty 2

## 2017-07-07 MED ORDER — FENTANYL CITRATE (PF) 250 MCG/5ML IJ SOLN
INTRAMUSCULAR | Status: DC | PRN
  Administered 2017-07-07: 50 ug via INTRAVENOUS
  Administered 2017-07-07: 300 ug via INTRAVENOUS
  Administered 2017-07-07 (×2): 50 ug via INTRAVENOUS
  Administered 2017-07-07 (×3): 100 ug via INTRAVENOUS
  Administered 2017-07-07: 150 ug via INTRAVENOUS
  Administered 2017-07-07: 50 ug via INTRAVENOUS
  Administered 2017-07-07: 200 ug via INTRAVENOUS
  Administered 2017-07-07 (×2): 100 ug via INTRAVENOUS
  Administered 2017-07-07: 150 ug via INTRAVENOUS

## 2017-07-07 MED ORDER — LACTATED RINGERS IV SOLN
INTRAVENOUS | Status: DC | PRN
  Administered 2017-07-07 (×2): via INTRAVENOUS

## 2017-07-07 MED ORDER — CHLORHEXIDINE GLUCONATE 0.12 % MT SOLN
15.0000 mL | OROMUCOSAL | Status: AC
  Administered 2017-07-07: 15 mL via OROMUCOSAL

## 2017-07-07 MED ORDER — DEXMEDETOMIDINE HCL IN NACL 400 MCG/100ML IV SOLN
0.0000 ug/kg/h | INTRAVENOUS | Status: DC
  Administered 2017-07-07: 0.3 ug/kg/h via INTRAVENOUS
  Filled 2017-07-07: qty 100

## 2017-07-07 MED ORDER — LACTATED RINGERS IV SOLN: INTRAVENOUS | Status: DC

## 2017-07-07 MED ORDER — INSULIN REGULAR BOLUS VIA INFUSION
0.0000 [IU] | Freq: Three times a day (TID) | INTRAVENOUS | Status: DC
  Administered 2017-07-08: 2 [IU] via INTRAVENOUS
  Filled 2017-07-07: qty 10

## 2017-07-07 MED ORDER — ORAL CARE MOUTH RINSE: 15.0000 mL | OROMUCOSAL | Status: DC

## 2017-07-07 MED ORDER — ACETAMINOPHEN 160 MG/5ML PO SOLN: 650.0000 mg | Freq: Once | ORAL | Status: AC

## 2017-07-07 MED ORDER — ALBUMIN HUMAN 5 % IV SOLN
INTRAVENOUS | Status: DC | PRN
  Administered 2017-07-07: 14:00:00 via INTRAVENOUS

## 2017-07-07 MED ORDER — SODIUM CHLORIDE 0.9% FLUSH
10.0000 mL | Freq: Two times a day (BID) | INTRAVENOUS | Status: DC
  Administered 2017-07-07 – 2017-07-09 (×3): 10 mL
  Administered 2017-07-10: 20 mL
  Administered 2017-07-10 – 2017-07-11 (×2): 10 mL
  Administered 2017-07-11: 20 mL
  Administered 2017-07-12: 10 mL
  Administered 2017-07-12: 30 mL
  Administered 2017-07-13: 10 mL

## 2017-07-07 MED ORDER — ORAL CARE MOUTH RINSE
15.0000 mL | Freq: Two times a day (BID) | OROMUCOSAL | Status: DC
  Administered 2017-07-08 – 2017-07-12 (×10): 15 mL via OROMUCOSAL

## 2017-07-07 MED ORDER — CHLORHEXIDINE GLUCONATE 0.12 % MT SOLN
15.0000 mL | Freq: Once | OROMUCOSAL | Status: AC
  Administered 2017-07-07: 15 mL via OROMUCOSAL

## 2017-07-07 MED ORDER — SODIUM CHLORIDE 0.9% FLUSH: 3.0000 mL | INTRAVENOUS | Status: DC | PRN

## 2017-07-07 MED ORDER — PHENYLEPHRINE 40 MCG/ML (10ML) SYRINGE FOR IV PUSH (FOR BLOOD PRESSURE SUPPORT)
PREFILLED_SYRINGE | INTRAVENOUS | Status: AC
  Filled 2017-07-07: qty 10

## 2017-07-07 MED ORDER — ACETAMINOPHEN 650 MG RE SUPP
650.0000 mg | Freq: Once | RECTAL | Status: AC
  Administered 2017-07-07: 650 mg via RECTAL

## 2017-07-07 MED ORDER — CHLORHEXIDINE GLUCONATE CLOTH 2 % EX PADS
6.0000 | MEDICATED_PAD | Freq: Every day | CUTANEOUS | Status: DC
  Administered 2017-07-07 – 2017-07-09 (×3): 6 via TOPICAL

## 2017-07-07 MED ORDER — NITROGLYCERIN IN D5W 200-5 MCG/ML-% IV SOLN
0.0000 ug/min | INTRAVENOUS | Status: DC
  Administered 2017-07-07: 80 ug/min via INTRAVENOUS

## 2017-07-07 MED ORDER — LIDOCAINE 2% (20 MG/ML) 5 ML SYRINGE
INTRAMUSCULAR | Status: AC
  Filled 2017-07-07: qty 5

## 2017-07-07 MED ORDER — MORPHINE SULFATE (PF) 2 MG/ML IV SOLN: 1.0000 mg | INTRAVENOUS | Status: DC | PRN

## 2017-07-07 MED ORDER — PHENYLEPHRINE HCL 10 MG/ML IJ SOLN
0.0000 ug/min | INTRAMUSCULAR | Status: DC
  Filled 2017-07-07: qty 2

## 2017-07-07 MED ORDER — PANTOPRAZOLE SODIUM 40 MG PO TBEC
40.0000 mg | DELAYED_RELEASE_TABLET | Freq: Every day | ORAL | Status: DC
  Administered 2017-07-09 – 2017-07-14 (×6): 40 mg via ORAL
  Filled 2017-07-07 (×6): qty 1

## 2017-07-07 MED ORDER — MIDAZOLAM HCL 10 MG/2ML IJ SOLN
INTRAMUSCULAR | Status: AC
  Filled 2017-07-07: qty 2

## 2017-07-07 MED ORDER — PROTAMINE SULFATE 10 MG/ML IV SOLN
INTRAVENOUS | Status: AC
  Filled 2017-07-07: qty 10

## 2017-07-07 MED ORDER — SODIUM CHLORIDE 0.45 % IV SOLN
INTRAVENOUS | Status: DC | PRN
  Administered 2017-07-07: 16:00:00 via INTRAVENOUS

## 2017-07-07 MED ORDER — DEXMEDETOMIDINE HCL IN NACL 200 MCG/50ML IV SOLN: 0.0000 ug/kg/h | INTRAVENOUS | Status: DC

## 2017-07-07 MED ORDER — ONDANSETRON HCL 4 MG/2ML IJ SOLN: 4.0000 mg | Freq: Four times a day (QID) | INTRAMUSCULAR | Status: DC | PRN

## 2017-07-07 MED ORDER — METOPROLOL TARTRATE 25 MG/10 ML ORAL SUSPENSION: 12.5000 mg | Freq: Two times a day (BID) | ORAL | Status: DC

## 2017-07-07 MED ORDER — ROCURONIUM BROMIDE 100 MG/10ML IV SOLN
INTRAVENOUS | Status: DC | PRN
  Administered 2017-07-07 (×5): 50 mg via INTRAVENOUS

## 2017-07-07 MED ORDER — POTASSIUM CHLORIDE 10 MEQ/50ML IV SOLN: 10.0000 meq | INTRAVENOUS | Status: AC

## 2017-07-07 SURGICAL SUPPLY — 114 items
ADAPTER CARDIO PERF ANTE/RETRO (ADAPTER) ×4 IMPLANT
BAG DECANTER FOR FLEXI CONT (MISCELLANEOUS) ×8 IMPLANT
BLADE SURG 11 STRL SS (BLADE) ×4 IMPLANT
CANISTER SUCT 3000ML PPV (MISCELLANEOUS) ×8 IMPLANT
CANNULA FEM VENOUS REMOTE 22FR (CANNULA) ×4 IMPLANT
CANNULA FEMORAL ART 14 SM (MISCELLANEOUS) ×4 IMPLANT
CANNULA GUNDRY RCSP 15FR (MISCELLANEOUS) ×4 IMPLANT
CANNULA OPTISITE PERFUSION 16F (CANNULA) IMPLANT
CANNULA OPTISITE PERFUSION 18F (CANNULA) ×4 IMPLANT
CANNULA SUMP PERICARDIAL (CANNULA) ×8 IMPLANT
CATH KIT ON Q 5IN SLV (PAIN MANAGEMENT) IMPLANT
CELLS DAT CNTRL 66122 CELL SVR (MISCELLANEOUS) ×3 IMPLANT
CLIP LIGATING EXTRA MED SLVR (CLIP) ×4 IMPLANT
CONN ST 1/4X3/8  BEN (MISCELLANEOUS) ×4
CONN ST 1/4X3/8 BEN (MISCELLANEOUS) ×12 IMPLANT
CONNECTOR 1/2X3/8X1/2 3 WAY (MISCELLANEOUS) ×1
CONNECTOR 1/2X3/8X1/2 3WAY (MISCELLANEOUS) ×3 IMPLANT
CONT SPEC 4OZ CLIKSEAL STRL BL (MISCELLANEOUS) ×4 IMPLANT
COVER BACK TABLE 24X17X13 BIG (DRAPES) ×4 IMPLANT
CRADLE DONUT ADULT HEAD (MISCELLANEOUS) ×4 IMPLANT
DERMABOND ADVANCED (GAUZE/BANDAGES/DRESSINGS) ×2
DERMABOND ADVANCED .7 DNX12 (GAUZE/BANDAGES/DRESSINGS) ×6 IMPLANT
DEVICE PMI PUNCTURE CLOSURE (MISCELLANEOUS) ×4 IMPLANT
DEVICE SUT CK QUICK LOAD MINI (Prosthesis & Implant Heart) ×4 IMPLANT
DEVICE TROCAR PUNCTURE CLOSURE (ENDOMECHANICALS) ×4 IMPLANT
DRAIN CHANNEL 28F RND 3/8 FF (WOUND CARE) ×8 IMPLANT
DRAPE BILATERAL SPLIT (DRAPES) ×4 IMPLANT
DRAPE C-ARM 42X72 X-RAY (DRAPES) ×4 IMPLANT
DRAPE CV SPLIT W-CLR ANES SCRN (DRAPES) ×4 IMPLANT
DRAPE INCISE IOBAN 66X45 STRL (DRAPES) ×16 IMPLANT
DRAPE SLUSH/WARMER DISC (DRAPES) ×4 IMPLANT
DRSG AQUACEL AG ADV 3.5X 6 (GAUZE/BANDAGES/DRESSINGS) ×4 IMPLANT
DRSG AQUACEL AG ADV 3.5X10 (GAUZE/BANDAGES/DRESSINGS) ×4 IMPLANT
DRSG COVADERM 4X8 (GAUZE/BANDAGES/DRESSINGS) ×4 IMPLANT
ELECT BLADE 6.5 EXT (BLADE) ×4 IMPLANT
ELECT REM PT RETURN 9FT ADLT (ELECTROSURGICAL) ×8
ELECTRODE REM PT RTRN 9FT ADLT (ELECTROSURGICAL) ×6 IMPLANT
FELT TEFLON 1X6 (MISCELLANEOUS) ×8 IMPLANT
FEMORAL VENOUS CANN RAP (CANNULA) IMPLANT
GAUZE SPONGE 4X4 12PLY STRL (GAUZE/BANDAGES/DRESSINGS) ×4 IMPLANT
GAUZE SPONGE 4X4 12PLY STRL LF (GAUZE/BANDAGES/DRESSINGS) ×4 IMPLANT
GLOVE BIO SURGEON STRL SZ 6 (GLOVE) ×12 IMPLANT
GLOVE BIO SURGEON STRL SZ 6.5 (GLOVE) ×28 IMPLANT
GLOVE BIOGEL M 6.5 STRL (GLOVE) ×12 IMPLANT
GLOVE BIOGEL PI IND STRL 6 (GLOVE) ×3 IMPLANT
GLOVE BIOGEL PI IND STRL 6.5 (GLOVE) ×9 IMPLANT
GLOVE BIOGEL PI INDICATOR 6 (GLOVE) ×1
GLOVE BIOGEL PI INDICATOR 6.5 (GLOVE) ×3
GLOVE ORTHO TXT STRL SZ7.5 (GLOVE) ×12 IMPLANT
GOWN STRL REUS W/ TWL LRG LVL3 (GOWN DISPOSABLE) ×27 IMPLANT
GOWN STRL REUS W/TWL LRG LVL3 (GOWN DISPOSABLE) ×9
IV NS IRRIG 3000ML ARTHROMATIC (IV SOLUTION) ×4 IMPLANT
IV SOD CHL 0.9% 1000ML (IV SOLUTION) ×4 IMPLANT
KIT BASIN OR (CUSTOM PROCEDURE TRAY) ×4 IMPLANT
KIT DILATOR VASC 18G NDL (KITS) ×8 IMPLANT
KIT DRAINAGE VACCUM ASSIST (KITS) ×4 IMPLANT
KIT SUCTION CATH 14FR (SUCTIONS) ×4 IMPLANT
KIT SUT CK MINI COMBO 4X17 (Prosthesis & Implant Heart) ×4 IMPLANT
KIT TURNOVER KIT B (KITS) ×4 IMPLANT
LEAD PACING MYOCARDI (MISCELLANEOUS) ×4 IMPLANT
LINE VENT (MISCELLANEOUS) ×8 IMPLANT
NEEDLE AORTIC ROOT 14G 7F (CATHETERS) ×4 IMPLANT
NS IRRIG 1000ML POUR BTL (IV SOLUTION) ×20 IMPLANT
PACK OPEN HEART (CUSTOM PROCEDURE TRAY) ×4 IMPLANT
PAD ARMBOARD 7.5X6 YLW CONV (MISCELLANEOUS) ×8 IMPLANT
PAD ELECT DEFIB RADIOL ZOLL (MISCELLANEOUS) ×4 IMPLANT
RING MCCARTHY ADAMS M26 (Prosthesis & Implant Heart) ×4 IMPLANT
RTRCTR WOUND ALEXIS 18CM MED (MISCELLANEOUS) ×4
SET CANNULATION TOURNIQUET (MISCELLANEOUS) ×4 IMPLANT
SET CARDIOPLEGIA MPS 5001102 (MISCELLANEOUS) ×4 IMPLANT
SET IRRIG TUBING LAPAROSCOPIC (IRRIGATION / IRRIGATOR) ×4 IMPLANT
SOLUTION ANTI FOG 6CC (MISCELLANEOUS) ×4 IMPLANT
STOPCOCK 4 WAY LG BORE MALE ST (IV SETS) ×4 IMPLANT
SUT BONE WAX W31G (SUTURE) ×4 IMPLANT
SUT E-PACK MINIMALLY INVASIVE (SUTURE) ×4 IMPLANT
SUT ETHIBOND (SUTURE) ×8 IMPLANT
SUT ETHIBOND 2 0 SH (SUTURE) ×5 IMPLANT
SUT ETHIBOND 2 0 SH 36X2 (SUTURE) ×3 IMPLANT
SUT ETHIBOND 2-0 RB-1 WHT (SUTURE) ×8 IMPLANT
SUT ETHIBOND X763 2 0 SH 1 (SUTURE) ×8 IMPLANT
SUT GORETEX CV 4 TH 22 36 (SUTURE) ×4 IMPLANT
SUT GORETEX CV4 TH-18 (SUTURE) ×8 IMPLANT
SUT MNCRL AB 3-0 PS2 18 (SUTURE) ×4 IMPLANT
SUT PROLENE 3 0 SH1 36 (SUTURE) ×16 IMPLANT
SUT PROLENE 4 0 RB 1 (SUTURE) ×4
SUT PROLENE 4-0 RB1 .5 CRCL 36 (SUTURE) ×12 IMPLANT
SUT PROLENE 6 0 C 1 30 (SUTURE) ×4 IMPLANT
SUT SILK  1 MH (SUTURE) ×1
SUT SILK 1 MH (SUTURE) ×3 IMPLANT
SUT SILK 2 0 SH CR/8 (SUTURE) IMPLANT
SUT SILK 2 0SH CR/8 30 (SUTURE) ×4 IMPLANT
SUT SILK 3 0 SH CR/8 (SUTURE) IMPLANT
SUT VIC AB 2-0 CT1 27 (SUTURE) ×4
SUT VIC AB 2-0 CT1 TAPERPNT 27 (SUTURE) ×12 IMPLANT
SUT VIC AB 2-0 CTX 27 (SUTURE) ×4 IMPLANT
SUT VIC AB 2-0 CTX 36 (SUTURE) IMPLANT
SUT VIC AB 3-0 SH 8-18 (SUTURE) ×8 IMPLANT
SUT VICRYL 2 TP 1 (SUTURE) IMPLANT
SYR 10ML LL (SYRINGE) ×4 IMPLANT
SYSTEM SAHARA CHEST DRAIN ATS (WOUND CARE) ×4 IMPLANT
TAPE CLOTH SURG 4X10 WHT LF (GAUZE/BANDAGES/DRESSINGS) ×4 IMPLANT
TAPE PAPER 2X10 WHT MICROPORE (GAUZE/BANDAGES/DRESSINGS) ×4 IMPLANT
TOWEL GREEN STERILE (TOWEL DISPOSABLE) ×4 IMPLANT
TOWEL GREEN STERILE FF (TOWEL DISPOSABLE) ×4 IMPLANT
TRAY CATH LUMEN 1 20CM STRL (SET/KITS/TRAYS/PACK) ×4 IMPLANT
TRAY FOLEY SLVR 16FR TEMP STAT (SET/KITS/TRAYS/PACK) ×4 IMPLANT
TROCAR XCEL BLADELESS 5X75MML (TROCAR) ×4 IMPLANT
TROCAR XCEL NON-BLD 11X100MML (ENDOMECHANICALS) ×8 IMPLANT
TUBE SUCT INTRACARD DLP 20F (MISCELLANEOUS) ×4 IMPLANT
TUBING ART PRESS 48 MALE/FEM (TUBING) ×8 IMPLANT
TUNNELER SHEATH ON-Q 11GX8 DSP (PAIN MANAGEMENT) IMPLANT
UNDERPAD 30X30 (UNDERPADS AND DIAPERS) ×4 IMPLANT
WATER STERILE IRR 1000ML POUR (IV SOLUTION) ×8 IMPLANT
WIRE .035 3MM-J 145CM (WIRE) ×4 IMPLANT

## 2017-07-07 NOTE — Anesthesia Procedure Notes (Signed)
Arterial Line Insertion Start/End6/19/2019 1:00 PM, 07/07/2017 1:00 PM  Right, femoral was placed Catheter size: 20 G  Attempts: 1 Procedure performed without using ultrasound guided technique.

## 2017-07-07 NOTE — Brief Op Note (Signed)
07/07/2017  3:15 PM  PATIENT:  VWUJWJKaleff H Hendon  42 y.o. male  PRE-OPERATIVE DIAGNOSIS:  1.SEVERE MR 2. ISCHEMIC CARDIOMYOPATHY  POST-OPERATIVE DIAGNOSIS:  1. SEVERE MR 2. ISCHEMIC CARDIOMYOPATHY  PROCEDURE:  TRANSESOPHAGEAL ECHOCARDIOGRAM (TEE), MEDIAN STERNOTOMY for MINIMALLY INVASIVE MITRAL VALVE REPAIR (MVR) using Carpentier-McCarthy Tito DineAdams Ring, Serial # [id number], Model # 4100,  size 26, and PATENT FORAMEN OVALE (PFO) CLOSURE   SURGEON:  Surgeon(s) and Role:    Purcell Nailswen, Clarence H, MD - Primary  PHYSICIAN ASSISTANT: Doree Fudgeonielle Zimmerman PA-C  ASSISTANTS: Alyssa Hagerty RNFA  ANESTHESIA:   general  EBL:  1100 mL   DRAINS: Chest tubes placed in the mediastinal and pleural spaces   COUNTS CORRECT:  YES  DICTATION: .Dragon Dictation  PLAN OF CARE: Admit to inpatient   PATIENT DISPOSITION:  ICU - intubated and hemodynamically stable.   Delay start of Pharmacological VTE agent (>24hrs) due to surgical blood loss or risk of bleeding: yes  BASELINE WEIGHT: 122 kg

## 2017-07-07 NOTE — Progress Notes (Signed)
  Echocardiogram Echocardiogram Transesophageal has been performed.  Joshua May 07/07/2017, 9:47 AM

## 2017-07-07 NOTE — Plan of Care (Signed)
  Problem: Clinical Measurements: Goal: Respiratory complications will improve Outcome: Progressing Note:  Patient extubated to 4L [place]   Problem: Activity: Goal: Risk for activity intolerance will decrease Outcome: Progressing   Problem: Elimination: Goal: Will not experience complications related to urinary retention Outcome: Progressing   Problem: Pain Managment: Goal: General experience of comfort will improve Outcome: Progressing   Problem: Cardiac: Goal: Hemodynamic stability will improve Outcome: Progressing   Problem: Respiratory: Goal: Respiratory status will improve Outcome: Progressing

## 2017-07-07 NOTE — Procedures (Signed)
Extubation Procedure Note  Patient Details:   Name: Joshua May DOB: January 21, 1975 MRN: [id number]   Airway Documentation:  Airway (Active)  Secured at (cm) 24 cm 07/07/2017  8:10 PM  Measured From Lips 07/07/2017  8:10 PM  Secured Location Right 07/07/2017  8:10 PM  Secured By Pink Tape 07/07/2017  8:10 PM  Cuff Pressure (cm H2O) 24 cm H2O 07/07/2017  3:52 PM  Site Condition Dry 07/07/2017  8:10 PM   Vent end date: 07/07/17 Vent end time: 2045   Evaluation  O2 sats: stable throughout Complications: No apparent complications Patient did tolerate procedure well. Bilateral Breath Sounds: Clear, Diminished   Yes  Georgia DuffKarsen H Agee 07/07/2017, 8:50 PM   Pt extubated per rapid wean protocol. Pt able to perform VC of 670 ml and NIF of -20 and IS of 700 ml . Pt did not have positive cuff leak. Dr Cornelius Moraswen stated to continue with extubation. Pt placed on 4 L [place] at this time and is doing well. RT will continue to monitor as needed.

## 2017-07-07 NOTE — Progress Notes (Signed)
Rapid wean protocol followed through. See ABG results. NIF and VC acceptable. Negative cuff leak. Paged Cornelius Moraswen MD to notify. Ordered to extubate. Patient extubated to 4L [place] with no distress and O2 sats 100%. Will continue to monitor patient closely.

## 2017-07-07 NOTE — Transfer of Care (Signed)
Immediate Anesthesia Transfer of Care Note  Patient: Joshua May  Procedure(s) Performed: MINIMALLY INVASIVE MITRAL VALVE REPAIR (MVR) using Carpentier-McCarthy Adams Ring size 26 (Right Chest) TRANSESOPHAGEAL ECHOCARDIOGRAM (TEE) (N/A ) PATENT FORAMEN OVALE (PFO) CLOSURE (N/A Chest)  Patient Location: SICU  Anesthesia Type:General  Level of Consciousness: Patient remains intubated per anesthesia plan  Airway & Oxygen Therapy: Patient remains intubated per anesthesia plan  Post-op Assessment: Report given to RN and Post -op Vital signs reviewed and stable  Post vital signs: Reviewed and stable  Last Vitals:  Vitals Value Taken Time  BP    Temp    Pulse    Resp 15 07/07/2017  3:48 PM  SpO2 100 % 07/07/2017  3:52 PM  Vitals shown include unvalidated device data.  Last Pain:  Vitals:   07/07/17 0625  PainSc: 0-No pain         Complications: No apparent anesthesia complications

## 2017-07-07 NOTE — OR Nursing (Signed)
14:57 - 20 minute call to SICU charge nurse

## 2017-07-07 NOTE — Interval H&P Note (Signed)
History and Physical Interval Note:  07/07/2017 6:19 AM  Joshua May  has presented today for surgery, with the diagnosis of MR  The various methods of treatment have been discussed with the patient and family. After consideration of risks, benefits and other options for treatment, the patient has consented to  Procedure(s): MINIMALLY INVASIVE MITRAL VALVE REPAIR (MVR) (Right) TRANSESOPHAGEAL ECHOCARDIOGRAM (TEE) (N/A) as a surgical intervention .  The patient's history has been reviewed, patient examined, no change in status, stable for surgery.  I have reviewed the patient's chart and labs.  Questions were answered to the patient's satisfaction.     [name]

## 2017-07-07 NOTE — Op Note (Signed)
CARDIOTHORACIC SURGERY OPERATIVE NOTE  Date of Procedure:  07/07/2017  Preoperative Diagnosis:   Severe Mitral Regurgitation  Ischemic Cardiomyopathy  Patent Foramen Ovale  Postoperative Diagnosis: Same  Procedure:    Minimally-Invasive Mitral Valve Repair  Edwards McCarthy-Adams IMR EtLogix Ring Annuloplasty (size 26mm, model # 4100, serial # [id number])  Closure of patent foramen ovale   Surgeon: Salvatore Decent. 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   Patient's cardiac history dates back to 2007 when he suffered an acute inferior wall myocardial infarction. At the time he lived in [place] and he was treated with PCI and stenting of the right coronary artery. Approximately 10 years ago he moved to [facility] and in March 2010 he suffered another acute inferior wall myocardial infarction involving the right coronary artery. He was treated with PCI and stenting of the right coronary artery using a bare-metal stent. He remained stable from a cardiac standpoint until  October 2017 when he presented with a third acute myocardial infarction involving acute occlusion of the left circumflex coronary artery. He was treated with PCI and stenting of the left circumflex coronary artery using a drug eluting stent. The right coronary artery remained patent at that time with 50% stenosis. At that time left ventricular ejection fraction was estimated 40-45%and the patient was reported to have mild mitral regurgitation. The patient states that ever since then he has had symptoms of exertional shortness of breath. Symptoms remained quite stable until over the past 2-3 months when he developed considerable progression of shortness of breath. He began to experience resting shortness of breath and orthopnea. He has never had any chest pain or chest tightness. Approximately 1 week ago the patient experienced new onset epigastric abdominal pain. Pain was described as sharp and constant. Initially he attributed this to food. Symptoms persisted over several days, ultimately prompting him to present to the emergency department. EKGs revealed sinus rhythm without acute ischemic changes. Serial troponin levels remain negative. CT angiogram of the chest revealed pulmonary embolus involving the right lower lobe. Transthoracic echocardiogram revealed severe left ventricular systolic dysfunction with ejection fraction estimated 25-30%. Left ventricle was mildly dilated. There was diffuse hypokinesis with inferolateral akinesis. There was moderate to severe mitral regurgitation. Attempts to quantify mitral regurgitation revealed ERO reported 0.09 cm corresponding to regurgitant volume estimated 11 mL.The ventricular septum was D-shaped, consistent with likely right ventricular pressure and volume overload. The patient subsequently underwent left heart catheterization March 03, 2017. Previous placed stents in the left circumflex were widely patent. There was 45% stenosis of the  proximal left circumflex coronary artery which was not felt to be hemodynamically significant using FFR.Stents in the proximal and mid right coronary artery were patent with 55% stenosis. There  was nonobstructive disease in the left anterior descending coronary artery. Left ventricular end-diastolic pressure was severely elevated, estimated 30-31 mmHg. Right heart catheterization was not performed. Cardiothoracic surgical consultation was requested.  The patient has been seen in consultation and counseled at length regarding the indications, risks and potential benefits of surgery.  All questions have been answered, and the patient provides full informed consent for the operation as described.    DETAILS OF THE OPERATIVE PROCEDURE  Preparation:  The patient is brought to the operating room on the above mentioned date and central monitoring was established by the anesthesia team including placement of Swan-Ganz catheter through the left internal jugular vein.  A radial arterial line is placed. The patient is placed in the supine position on the operating table.  Intravenous antibiotics are administered. General endotracheal anesthesia is induced uneventfully. The patient is initially intubated using a dual lumen endotracheal tube.  A Foley catheter is placed.  Baseline transesophageal echocardiogram was performed.  Findings were notable for severe global left ventricular systolic dysfunction.  There were no significant wall motion abnormalities.  Ejection fraction was estimated 35%.  There was severe mitral regurgitation.  The jet of regurgitation was brought in central.  There was some degree of downward displacement of the mitral subvalvular apparatus secondary to left ventricular chamber enlargement.  There was restricted leaflet motion during systole.  Left ventricular end-diastolic diameter was 6.3 cm.  Left ventricular end-systolic diameter was 5.3 cm.  The aortic valve appeared normal.  Right  ventricular size and function was normal.  A soft roll is placed behind the patient's left scapula and the neck gently extended and turned to the left.   The patient's right neck, chest, abdomen, both groins, and both lower extremities are prepared and draped in a sterile manner. A time out procedure is performed.  Surgical Approach:  A right miniature anterolateral thoracotomy incision is performed. The incision is placed just lateral to and superior to the right nipple. The pectoralis major muscle is retracted medially and completely preserved. The right pleural space is entered through the 3rd intercostal space. A soft tissue retractor is placed.  Two 11 mm ports are placed through separate stab incisions inferiorly. The right pleural space is insufflated continuously with carbon dioxide gas through the posterior port during the remainder of the operation.  A pledgeted sutures placed through the dome of the right hemidiaphragm and retracted inferiorly to facilitate exposure.  A longitudinal incision is made in the pericardium 3 cm anterior to the phrenic nerve and silk traction sutures are placed on either side of the incision for exposure.   Extracorporeal Cardiopulmonary Bypass and Myocardial Protection:  A small incision is made in the right inguinal crease and the anterior surface of the right common femoral artery and right common femoral vein are identified.  The right femoral artery is notably quite small.  Subsequently a second incision is made in the left inguinal crease and the anterior surface of the left common femoral artery and left common femoral vein are identified.  The left common femoral artery is normal caliber and free of significant disease.  The patient is placed in Trendelenburg position. The right internal jugular vein is cannulated with Seldinger technique and a guidewire advanced into the right atrium. The patient is heparinized systemically. The right internal jugular vein  is cannulated with a 14 [place] pediatric femoral venous cannula. Pursestring sutures are placed on the anterior surface of the left common femoral vein and left common femoral  artery. The left common femoral vein is cannulated with the Seldinger technique and a guidewire is advanced under transesophageal echocardiogram guidance through the right atrium. The femoral vein is cannulated with a long 22 French femoral venous cannula. The left common femoral artery is cannulated with Seldinger technique and a flexible guidewire is advanced until it can be appreciated intraluminally in the descending thoracic aorta on transesophageal echocardiogram. The femoral artery is cannulated with an 18 French femoral arterial cannula.  Adequate heparinization is verified.     The entire pre-bypass portion of the operation was notable for stable hemodynamics.  Cardiopulmonary bypass was begun.  Vacuum assist venous drainage is utilized. The incision in the pericardium is extended in both directions. Venous drainage and exposure are notably excellent. A retrograde cardioplegia cannula is placed through the right atrium into the coronary sinus using transesophageal echocardiogram guidance.  An antegrade cardioplegia cannula is placed in the ascending aorta.    The patient is cooled to 28C systemic temperature.  The aortic cross clamp is applied and cardioplegia is delivered initially in an antegrade fashion through the aortic root using modified [place] cold blood cardioplegia ([place] blood cardioplegia protocol).   The initial cardioplegic arrest is rapid with early diastolic arrest.  Myocardial protection was felt to be excellent.   Closure of Patent Foramen Ovale:  A left atriotomy incision was performed through the interatrial groove and extended partially across the back wall of the left atrium after opening the oblique sinus inferiorly.  Examination of the left atrial surface of the intra-atrial septum reveals a  patent foramen ovale.  The patent foramen ovale is closed using simple running 4-0 Prolene suture.   Mitral Valve Repair:  The mitral valve is exposed using a self-retaining retractor.  The mitral valve was inspected and notable for essentially normal leaflet and subvalvular apparatus.  There was no annular calcification.  There was minimal fibrosis of the valve leaflets.  The subvalvular apparatus did not appear restricted.  Interrupted 2-0 Ethibond horizontal mattress sutures are placed circumferentially around the entire mitral valve annulus. The valve was sized to a 28 mm annuloplasty ring, based upon the transverse distance between the left and right commissures and the height of the anterior leaflet, corresponding to a size just slightly larger than the overall surface area of the anterior leaflet.  In order to treat the patient's underlying ischemic cardiomyopathy the ring annuloplasty was downsized and an Edwards McCarthy-Adams ETLogix annuloplasty ring (size 26mm, model #4100, serial [id number]) was secured in place uneventfully. All ring sutures were secured using a Cor-knot device.    The valve was tested with saline and appeared competent. There is no residual leak. There was a broad, symmetrical line of coaptation of the anterior and posterior leaflet which was confirmed using the blue ink test.  There was notably greater than 14 mm of surface area of coaptation.  Rewarming is begun.   Procedure Completion:  The atriotomy was closed using a 2-layer closure of running 3-0 Prolene suture after placing a sump drain across the mitral valve to serve as a left ventricular vent.  One final dose of warm retrograde "reanimation dose" cardioplegia was administered retrograde through the coronary sinus catheter while all air was evacuated through the aortic root.  The aortic cross clamp was removed after a total cross clamp time of 66 minutes.  Epicardial pacing wires are fixed to the inferior wall  of the right ventricule and to the right atrial appendage. The patient is rewarmed to  37C temperature. The left ventricular vent is removed.  The patient is ventilated and flow volumes turndown while the mitral valve repair is inspected using transesophageal echocardiogram. The valve repair appears intact with no residual leak. The antegrade cardioplegia cannula is now removed. The patient is weaned and disconnected from cardiopulmonary bypass.  The patient's rhythm at separation from bypass was sinus.  The patient was weaned from bypass on milrinone at 0.375 mcg/kg/min.  After the initial separation from bypass the patient developed transient left ventricular dysfunction associated with elevation of ST segments on EKG suggestive of air embolization.  Cardio pony bypass was reinstituted for a total of 5 extra minutes and the EKG changes resolved.  The patient was again separated from bypass in normal sinus rhythm.  Total cardiopulmonary bypass time for the operation was 136 minutes.  Followup transesophageal echocardiogram performed after separation from bypass revealed a well-seated annuloplasty ring in the mitral position with a normal functioning mitral valve. There was no residual leak.  Left ventricular function was unchanged from preoperatively.  The mean gradient across the mitral valve was estimated to be 6 mmHg.  The femoral arterial and venous cannulae were removed uneventfully. There was a palpable pulse in the distal right common femoral artery after removal of the cannula. Protamine was administered to reverse the anticoagulation. The right internal jugular cannula was removed and manual pressure held on the neck for 15 minutes.  Single lung ventilation was begun. The atriotomy closure was inspected for hemostasis. The pericardial sac was drained using a 28 French Bard drain placed through the anterior port incision.  The right pleural space is irrigated with saline solution and inspected for  hemostasis. The right pleural space was drained using a 28 French Bard drain placed through the posterior port incision. The miniature thoracotomy incision was closed in multiple layers in routine fashion. The right groin incision was inspected for hemostasis and closed in multiple layers in routine fashion.  The post-bypass portion of the operation was notable for stable rhythm and hemodynamics.  No blood products were administered during the operation.   Disposition:  The patient tolerated the procedure well.  The patient was reintubated using a single lumen endotracheal tube and subsequently transported to the surgical intensive care unit in stable condition. There were no intraoperative complications. All sponge instrument and needle counts are verified correct at completion of the operation.     Salvatore Decentlarence H. Cornelius Moraswen MD 07/07/2017 3:18 PM

## 2017-07-07 NOTE — Progress Notes (Signed)
[facility] BRIEF SICU PROGRESS NOTE  Day of Surgery  S/P Procedure(s) (LRB): MINIMALLY INVASIVE MITRAL VALVE REPAIR (MVR) using Carpentier-McCarthy Adams Ring size 26 (Right) TRANSESOPHAGEAL ECHOCARDIOGRAM (TEE) (N/A) PATENT FORAMEN OVALE (PFO) CLOSURE (N/A)   Starting to wake on vent NSR w/ stable hemodynamics O2 sats 100% Chest tube output low UOP > 150 mL/hr Labs okay  Plan: Continue routine early postop  [name], MD 07/07/2017 6:35 PM

## 2017-07-07 NOTE — Anesthesia Procedure Notes (Signed)
Procedure Name: Intubation Date/Time: 07/07/2017 8:59 AM Performed by: Clearnce Sorrel, CRNA Pre-anesthesia Checklist: Patient identified, Emergency Drugs available, Suction available, Patient being monitored and Timeout performed Patient Re-evaluated:Patient Re-evaluated prior to induction Oxygen Delivery Method: Circle system utilized Preoxygenation: Pre-oxygenation with 100% oxygen Induction Type: IV induction Ventilation: Mask ventilation without difficulty, Oral airway inserted - appropriate to patient size and Two handed mask ventilation required Laryngoscope Size: Mac and 4 Grade View: Grade II Tube type: Oral Endobronchial tube: Left and 39 Fr Number of attempts: 1 Airway Equipment and Method: Stylet Placement Confirmation: ETT inserted through vocal cords under direct vision,  positive ETCO2 and breath sounds checked- equal and bilateral Tube secured with: Tape Dental Injury: Teeth and Oropharynx as per pre-operative assessment

## 2017-07-07 NOTE — Progress Notes (Signed)
Rapid wean protocol started. 

## 2017-07-07 NOTE — Discharge Summary (Signed)
Physician Discharge Summary       [address]       [name] [address]             [phone]    Patient ID: Joshua May MRN: [id number] DOB/AGE: 10/09/1975 41 y.o.  Admit date: 07/07/2017 Discharge date: 07/14/2017  Admission Diagnoses: Severe mitral regurgitation  Discharge Diagnoses:  1. S/P MVR (mitral valve repair) 2. History of Obstructive sleep apnea 3. History of CAD (coronary artery disease) 4. History of ischemic cardiomyopathy 5. History of Chronic systolic CHF (congestive heart failure) (HCC) 6. History of Lupus anticoagulant disorder [facility])    Procedure (s):      Minimally-Invasive Mitral Valve Repair             Edwards McCarthy-Adams IMR EtLogix Ring Annuloplasty (size 26mm, model # 4100, serial # [id number])             Closure of patent foramen ovale by Dr. Cornelius Moras on 07/07/2017.  History of Presenting Illness: Patient is a 42 year old African-American male with history of premature coronary artery disease status post multiple previous myocardial infarctions treated with PCI and stenting,ischemic cardiomyopathy, chronic systolic congestive heart failure, hyperlipidemia, obstructive sleep apnea, and long-standing tobacco abuse who was admitted to the hospital with pulmonary embolus and acute exacerbation of chronic systolic congestive heart failure and has been referred for surgical consultation to discuss treatment options for management of severe mitral regurgitation.   Patient's cardiac history dates back to 2007 when he suffered an acute inferior wall myocardial infarction. At the time he lived in [place] and he was treated with PCI and stenting of the right coronary artery. Approximately 10 years ago he moved to [facility] and in March 2010 he suffered another acute inferior wall myocardial infarction involving the right coronary artery. He was treated with PCI and stenting of the right coronary artery using a bare-metal stent. He  remained stable from a cardiac standpoint until October 2017 when he presented with a third acute myocardial infarction involving acute occlusion of the left circumflex coronary artery. He was treated with PCI and stenting of the left circumflex coronary artery using a drug eluting stent. The right coronary artery remained patent at that time with 50% stenosis. At that time left ventricular ejection fraction was estimated 40-45%and the patient was reported to have mild mitral regurgitation. The patient states that ever since then he has had symptoms of exertional shortness of breath. Symptoms remained quite stable until over the past 2-3 months when he developed considerable progression of shortness of breath. He began to experience resting shortness of breath and orthopnea. He has never had any chest pain or chest tightness. Approximately 1 week ago the patient experienced new onset epigastric abdominal pain. Pain was described as sharp and constant. Initially he attributed this to food. Symptoms persisted over several days, ultimately prompting him to present to the emergency department. EKGs revealed sinus rhythm without acute ischemic changes. Serial troponin levels remain negative. CT angiogram of the chest revealed pulmonary embolus involving the right lower lobe. Transthoracic echocardiogram revealed severe left ventricular systolic dysfunction with ejection fraction estimated 25-30%. Left ventricle was mildly dilated. There was diffuse hypokinesis with inferolateral akinesis. There was moderate to severe mitral regurgitation. Attempts to quantify mitral regurgitation revealed ERO reported 0.09 cm corresponding to regurgitant volume estimated 11 mL.The ventricular septum was D-shaped, consistent with likely right ventricular pressure and volume overload. The patient subsequently underwent left heart catheterization March 03, 2017. Previous placed stents  in the left circumflex were  widely patent. There was 45% stenosis of the proximal left circumflex coronary artery which was not felt to be hemodynamically significant using FFR.Stents in the proximal and mid right coronary artery were patent with 55% stenosis. There was nonobstructive disease in the left anterior descending coronary artery. Left ventricular end-diastolic pressure was severely elevated, estimated 30-31 mmHg. Right heart catheterization was not performed. Cardiothoracic surgical consultation was requested.  The patient is married and lives locally in [place] with his wife and 2 teenage children. He is currently out of work, having previously worked in [employer]. He states that he has been limited by exertional shortness of breath dating back to October 2017. Symptoms have become considerably worse over the last few months, culminating in this admission. Prior to admission the patient had occasional symptoms of resting shortness of breath with chronic orthopnea and occasional PND. He has not had exertional chest pain or chest tightness. Symptoms of shortness of breath have improved with medical therapy in the hospital. The abdominal pain which he experienced at the time of admission resolved quickly and has not recurred. He denies any fevers chills or productive cough.  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 The patient was hospitalized in February of this year with acute exacerbation of chronic systolic congestive heart failure. He was also diagnosed with small pulmonary embolus in the  right lower lobe on CT angiogram, although imaging suggested that the pulmonary embolus was probably old. I had the opportunity to see the patient in consultation at that time (full consultation note dated March 04, 2017). His heart failure medications were optimized and since then the patient has been followed carefully in the advanced heart failure clinic.He was seen in follow-up by Dr. Shirlee Latch at which time he was taking all medications as directed and clinically remained stable, although he still reports significant exertional shortness of breath. He subsequently underwent TEE on April 19, 2017. TEE revealed mildly dilated left ventricle with ejection fraction estimated 35-40% in the setting of severe mitral regurgitation. There was inferolateral akinesis. There was severe mitral regurgitation with ERO measured 0.43 cm. Functional anatomy appeared consistent with classical type IIIb systolic restriction caused by infarct related mitral regurgitation with tethering of the posterior leaflet. There was no obvious reversal of flow in the pulmonary veins but there was severe systolic flattening. There was a small patent foramen ovale appreciated. The patient was referred back to our office and I last saw him on April 28, 2017 at which time we discussed the indications, risk, and potential benefits of elective mitral valve repair or replacement for stage D severe symptomatic secondary mitral regurgitation which had failed optimal medical therapy. At the time the patient decided he wanted to proceed with surgery but schedule it for some time in June. He returns to our office today for follow-up. He has been seen recently in the advanced heart failure clinic where he is being followed very closely. He states that he still gets short of breath with low-level activity. He denies any resting shortness of breath, PND, orthopnea, or lower extremity edema. Weight has been up several pounds recently. He  has not had any chest pain or chest tightness. The patientwas  againcounseled at length regarding the indications, risks and potential benefits of mitral valve repair or replacement. The rationale for elective surgery has been explained, including a comparison between surgery and continued medical therapy with close follow-up. Alternative surgical approaches have been discussed including a comparison between conventional sternotomy and minimally-invasive techniques.The patient hopes to avoid conventional median sternotomy if possible. Under the circumstances we would not plan elective surgical revascularization. I think this is reasonable, although it remains possible that the patient's right coronary artery disease may become clinically significant at some point in the not too distant future. The likelihood of successful and durable valve repair has been discussed with particular reference to the findings of their recent echocardiogram. Based upon these findings and previous experience, I have quoted them a greater than 50 percent likelihood of successful valve repair. In the unlikely event that their valve cannot be successfully repaired, we discussed the possibility of replacing the mitral valve using a mechanical prosthesis with the attendant need for long-term anticoagulation versus the alternative of replacing it using a bioprosthetic tissue valve with its potential for late structural valve deterioration and failure, depending upon the patient's longevity. The patient specifically requests that if the mitral valve must be replaced that it be done using a mechanical valve. The patient understands and accepts all potential risks of surgery. Pre operative carotid duplex US showed no significant internal carotid artery stenosis bilaterally. Patient underwent a minimally invasive mitral valve repair on 07/07/2017.  Brief Hospital Course:  The patient was extubated the evening of surgery without  difficulty. He remained afebrile and hemodynamically stable. He was weaned off of Milrinone drip. Theone Murdoch, a line, and foley were removed early in the post operative course.  He was started slowly on coumadin for his Mitral Valve Repair.  He was slowly restarted on his heart failure medications as hemodynamics allowed.    Chest tubes remained for a few days and after the output decreased were removed on 07/10/2017. Follow up CXR showed stable appearance of apical pneumothorax. Coreg was restarted. He was volume over loaded and diuresed. He had ABL anemia. He did not require a post op transfusion. Last H and H was 9.1 and 29.5. He was weaned off the insulin drip.  he patient's HGA1C pre op was 4.9. The patient was felt surgically stable for transfer from the ICU to [facility] for further convalescence on 07/10/2017. He continues to progress with cardiac rehab. He was ambulating on room air. He has been tolerating a diet and has had a bowel movement. Epicardial pacing wires were removed on 07/10/2017 prior to chest tube removal.  He remains on Coumadin and as discussed with Dr. Cornelius Moras he will be discharged on 5 mg daily.  His INR should be 2-3 for his Mitral Valve Repair.  His most recent INR is up to 1.53 and we have set up for a repeat check on Friday 07/16/2017.  His BNP on Sunday 06/23 was 567 but his weight was recorded as below pre op weight. He is on Lasix 40 mg daily and Spirinolactone 12.5 mg daily (was on prior to surgery). Patient is an established patient with heart failure so we asked for the recommendations regarding medications. Digoxin 0.125 mg daily was added to previously mentioned. Also, on 06/26, heart failure increased Spironolactone to 25 mg daily and Digoxin to 0.25 mg daily. Coreg was STOPPED and Lasix was changed to 40 mg bid. His potassium this am is 4.2. With the increase in Spironolactone, I have not  included a potassium supplement. Heart failure will see him next week and they can determine if a  potassium supplement is needed.  Chest tube sutures will be removed in the office after discharge. The patient is felt surgically stable for discharge today.   Latest Vital Signs: Blood pressure 116/64, pulse (!) 118, temperature 98.8 F (37.1 C), temperature source Oral, resp. rate 19, height 5\' 11"  (1.803 m), weight 257 lb (116.6 kg), SpO2 98 %.  Physical Exam:  Cardiovascular: RRR, no murmur Pulmonary: Slightly diminished right base and clear on the left. Abdomen: Soft, non tender, bowel sounds present. Extremities: Trace bilateral lower extremity edema. Wounds: Clean and dry.  No erythema or signs of infection.   Discharge Condition: Stable and discharged to home.  Recent laboratory studies:  Lab Results  Component Value Date   WBC 8.0 07/10/2017   HGB 9.1 (L) 07/10/2017   HCT 29.5 (L) 07/10/2017   MCV 96.1 07/10/2017   PLT 121 (L) 07/10/2017   Lab Results  Component Value Date   NA 135 07/14/2017   K 4.2 07/14/2017   CL 98 07/14/2017   CO2 28 07/14/2017   CREATININE 1.05 07/14/2017   GLUCOSE 120 (H) 07/14/2017    Diagnostic Studies: Dg Chest 2 View  Result Date: 07/11/2017 CLINICAL DATA:  Status post mitral valve repair on 07/07/2017. Follow-up exam. EXAM: CHEST - 2 VIEW COMPARISON:  07/09/2017 and older exams. FINDINGS: There is persistent right lung opacity, and low lung volumes on the right, consistent with atelectasis. Since the prior exam, the 2 right-sided chest tubes have been removed. There is a persistent tiny apical pneumothorax, unchanged from the most recent prior exam. Left lung is clear. No mediastinal widening. There is a new left PICC, tip projecting in the lower inferior vena cava. Left internal jugular introducer sheath has been removed. IMPRESSION: 1. Status post removal of the right-sided chest tubes and left internal jugular introducer sheath. 2. Persistent tiny right apical pneumothorax unchanged from the most recent prior exam. 3. Persistent right  sided atelectasis and decreased right sided lung volume. This is stable from the most recent prior exam. 4. New left PICC is well positioned. Electronically Signed   By: Amie Portlandavid  Ormond M.D.   On: 07/11/2017 07:45   Ct Angio Chest Aorta W/cm &/or Wo/cm  Result Date: 06/22/2017 CLINICAL DATA:  42 year old male undergoing planned mitral valve replacement surgery for mitral valve insufficiency. EXAM: CT ANGIOGRAPHY CHEST, ABDOMEN AND PELVIS TECHNIQUE: Multidetector CT imaging through the chest, abdomen and pelvis was performed using the standard protocol during bolus administration of intravenous contrast. Multiplanar reconstructed images and MIPs were obtained and reviewed to evaluate the vascular anatomy. CONTRAST:  75mL ISOVUE-370 IOPAMIDOL (ISOVUE-370) INJECTION 76% COMPARISON:  CTA chest 02/28/2017 FINDINGS: CTA CHEST FINDINGS Cardiovascular: 2 vessel aortic arch anatomy. The right brachiocephalic and left common carotid artery share a common origin. The aorta is normal in caliber. No evidence of aneurysm or dissection. Normal coronary artery anatomy. A metallic stent is present in the circumflex coronary artery. Mild cardiomegaly with left ventricular dilatation. No pericardial effusion. Normal caliber main pulmonary artery. No evidence of anomalous pulmonary venous return. Mediastinum/Nodes: Unremarkable CT appearance of the thyroid gland. No suspicious mediastinal or hilar adenopathy. No soft tissue mediastinal mass. The thoracic esophagus is unremarkable. Lungs/Pleura: Minimal paraseptal emphysema in the lung apices. Otherwise, the lungs are clear. No suspicious pulmonary nodule, pleural effusion or pneumothorax. Musculoskeletal: No acute fracture or aggressive appearing lytic or blastic osseous lesion. Review of the MIP  images confirms the above findings. CTA ABDOMEN AND PELVIS FINDINGS VASCULAR Aorta: Minimal fibrofatty atherosclerotic plaque along the abdominal aorta. No evidence of aneurysm or dissection.  Celiac: Patent without evidence of aneurysm, dissection, vasculitis or significant stenosis. SMA: Patent without evidence of aneurysm, dissection, vasculitis or significant stenosis. Renals: Both renal arteries are patent without evidence of aneurysm, dissection, vasculitis, fibromuscular dysplasia or significant stenosis. IMA: Patent without evidence of aneurysm, dissection, vasculitis or significant stenosis. Inflow: Patent without evidence of aneurysm, dissection, vasculitis or significant stenosis. Veins: No obvious venous abnormality within the limitations of this arterial phase study. Review of the MIP images confirms the above findings. NON-VASCULAR Hepatobiliary: Normal hepatic contour and morphology. No discrete hepatic lesions. Normal appearance of the gallbladder. No intra or extrahepatic biliary ductal dilatation. Pancreas: Unremarkable. No pancreatic ductal dilatation or surrounding inflammatory changes. Spleen: Normal in size without focal abnormality. Adrenals/Urinary Tract: Normal adrenal glands. Mild focal renal cortical scarring in the lateral aspect of the left upper pole, the anterior aspect of the left lower pole and the anterior aspect of the right lower pole. 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 No evidence of hydronephrosis, nephrolithiasis or enhancing renal mass. The ureters and bladder are unremarkable. Stomach/Bowel: No evidence of obstruction or focal bowel wall thickening. Normal appendix in the right lower quadrant. The terminal ileum is unremarkable. Lymphatic: No suspicious lymphadenopathy. Reproductive: Prostate is unremarkable. Other: No abdominal wall hernia or abnormality. No abdominopelvic ascites. Musculoskeletal: No acute fracture or aggressive appearing lytic or blastic osseous lesion. Review of the MIP images confirms the above findings. IMPRESSION: CTA CHEST 1. Cardiomegaly with left ventricular dilatation consistent with the clinical history of  mitral valve insufficiency. 2. Mild paraseptal pulmonary emphysema.  Emphysema (ICD10-J43.9). 3. Left circumflex coronary artery stent. 4. No evidence of aortic aneurysm or dissection. CTA ABD/PELVIS 1. No evidence of aneurysm, dissection or significant tortuosity or hemodynamically significant stenosis. 2. Mild fibrofatty atherosclerotic plaque along the abdominal aorta. Aortic Atherosclerosis (ICD10-170.0) 3. 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 Signed, Sterling Big, MD [facility] Radiology Electronically Signed   By: Malachy Moan M.D.   On: 06/22/2017 08:44    Ct Angio Abd/pel W/ And/or W/o  Result Date: 06/22/2017 CLINICAL DATA:  42 year old male undergoing planned mitral valve replacement surgery for mitral valve insufficiency. EXAM: CT ANGIOGRAPHY CHEST, ABDOMEN AND PELVIS TECHNIQUE: Multidetector CT imaging through the chest, abdomen and pelvis was performed using the standard protocol during bolus administration of intravenous contrast. Multiplanar reconstructed images and MIPs were obtained and reviewed to evaluate the vascular anatomy. CONTRAST:  75mL ISOVUE-370 IOPAMIDOL (ISOVUE-370) INJECTION 76% COMPARISON:  CTA chest 02/28/2017 FINDINGS: CTA CHEST FINDINGS Cardiovascular: 2 vessel aortic arch anatomy. The right brachiocephalic and left common carotid artery share a common origin. The aorta is normal in caliber. No evidence of aneurysm or dissection. Normal coronary artery anatomy. A metallic stent is present in the circumflex coronary artery. Mild cardiomegaly with left ventricular dilatation. No pericardial effusion. Normal caliber main pulmonary artery. No evidence of anomalous pulmonary venous return. Mediastinum/Nodes: Unremarkable CT appearance of the thyroid gland. No suspicious mediastinal or hilar adenopathy. No soft tissue mediastinal mass. The thoracic esophagus is  unremarkable. Lungs/Pleura: Minimal paraseptal emphysema in the lung apices. Otherwise, the lungs are clear. No suspicious pulmonary nodule, pleural effusion or pneumothorax. Musculoskeletal: No acute fracture or aggressive appearing lytic or blastic osseous lesion. Review of the MIP images confirms the above findings.  CTA ABDOMEN AND PELVIS FINDINGS VASCULAR Aorta: Minimal fibrofatty atherosclerotic plaque along the abdominal aorta. No evidence of aneurysm or dissection. Celiac: Patent without evidence of aneurysm, dissection, vasculitis or significant stenosis. SMA: Patent without evidence of aneurysm, dissection, vasculitis or significant stenosis. Renals: Both renal arteries are patent without evidence of aneurysm, dissection, vasculitis, fibromuscular dysplasia or significant stenosis. IMA: Patent without evidence of aneurysm, dissection, vasculitis or significant stenosis. Inflow: Patent without evidence of aneurysm, dissection, vasculitis or significant stenosis. Veins: No obvious venous abnormality within the limitations of this arterial phase study. Review of the MIP images confirms the above findings. NON-VASCULAR Hepatobiliary: Normal hepatic contour and morphology. No discrete hepatic lesions. Normal appearance of the gallbladder. No intra or extrahepatic biliary ductal dilatation. Pancreas: Unremarkable. No pancreatic ductal dilatation or surrounding inflammatory changes. Spleen: Normal in size without focal abnormality. Adrenals/Urinary Tract: Normal adrenal glands. Mild focal renal cortical scarring in the lateral aspect of the left upper pole, the anterior aspect of the left lower pole and the anterior aspect of the right lower pole. 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 No evidence of hydronephrosis, nephrolithiasis or enhancing renal mass. The ureters and bladder are unremarkable. Stomach/Bowel: No evidence of obstruction or focal bowel wall thickening. Normal appendix in the  right lower quadrant. The terminal ileum is unremarkable. Lymphatic: No suspicious lymphadenopathy. Reproductive: Prostate is unremarkable. Other: No abdominal wall hernia or abnormality. No abdominopelvic ascites. Musculoskeletal: No acute fracture or aggressive appearing lytic or blastic osseous lesion. Review of the MIP images confirms the above findings. IMPRESSION: CTA CHEST 1. Cardiomegaly with left ventricular dilatation consistent with the clinical history of mitral valve insufficiency. 2. Mild paraseptal pulmonary emphysema.  Emphysema (ICD10-J43.9). 3. Left circumflex coronary artery stent. 4. No evidence of aortic aneurysm or dissection. CTA ABD/PELVIS 1. No evidence of aneurysm, dissection or significant tortuosity or hemodynamically significant stenosis. 2. Mild fibrofatty atherosclerotic plaque along the abdominal aorta. Aortic Atherosclerosis (ICD10-170.0) 3. 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 Signed, Sterling Big, MD [facility] Radiology Electronically Signed   By: Malachy Moan M.D.   On: 06/22/2017 08:44   Discharge Instructions    Amb Referral to Cardiac Rehabilitation   Complete by:  As directed    Diagnosis:  Valve Repair   Valve:  Mitral      Discharge Medications: Allergies as of 07/14/2017      Reactions   Shellfish Allergy Anaphylaxis      Medication List    STOP taking these medications   carvedilol 3.125 MG tablet Commonly known as:  COREG   nitroGLYCERIN 0.4 MG SL tablet Commonly known as:  NITROSTAT   rivaroxaban 20 MG Tabs tablet Commonly known as:  XARELTO     TAKE these medications   aspirin 81 MG EC tablet Take 1 tablet (81 mg total) by mouth daily.   atorvastatin 80 MG tablet Commonly known as:  LIPITOR Take 1 tablet (80 mg total) by mouth daily.   digoxin 0.25 MG tablet Commonly known as:  LANOXIN Take 1 tablet (0.25 mg  total) by mouth daily. Start taking on:  07/15/2017   ferrous sulfate 325 (65 FE) MG tablet Take 1 tablet (325 mg total) by mouth daily with breakfast. For one month then stop.   folic acid 1 MG tablet Commonly known as:  FOLVITE Take 1 tablet (1 mg total) by mouth daily. For one month then stop.   furosemide 40  MG tablet Commonly known as:  LASIX Take 1 tablet (40 mg total) by mouth 2 (two) times daily. What changed:    medication strength  how much to take  when to take this   HYDROcodone-acetaminophen 10-325 MG tablet Commonly known as:  NORCO Take one tablet by mouth every 4-6 hours PRN severe pain.   losartan 25 MG tablet Commonly known as:  COZAAR Take 0.5 tablets (12.5 mg total) by mouth daily. What changed:  when to take this   Meclizine HCl 25 MG Chew Chew 1 tablet (25 mg total) by mouth daily as needed. What changed:  reasons to take this   spironolactone 25 MG tablet Commonly known as:  ALDACTONE Take 1 tablet (25 mg total) by mouth daily. What changed:  how much to take   warfarin 7.5 MG tablet Commonly known as:  COUMADIN Take 1 tablet (7.5 mg total) by mouth daily at 6 PM. Or as directed            Durable Medical Equipment  (From admission, onward)        Start     Ordered   07/14/17 1014  For home use only DME Walker rolling  Once    Question:  Patient needs a walker to treat with the following condition  Answer:  Weakness   07/14/17 1013     The patient has been discharged on:   1.Beta Blocker:  Yes [ x  ]                              No   [   ]                              If No, reason:  2.Ace Inhibitor/ARB: Yes [  x ]                                     No  [    ]                                     If No, reason:  3.Statin:   Yes [ x  ]                  No  [   ]                  If No, reason:  4.Ecasa:  Yes  [  x ]                  No   [   ]                  If No, reason:  Follow Up Appointments: Follow-up  Information    Purcell Nails, MD Follow up on 07/26/2017.   Specialty:  Cardiothoracic Surgery Why:  PA/LAT CXR to be taken (at [facility] which is in the same building as Dr. Orvan July office) at 4:00 ;Appointment time is at 4:30 Contact information: [address] [place] [address] [phone]        Laurey Morale, MD. Call on 07/21/2017.   Specialty:  Cardiology Why:  Heart Failure  Follow up at 2:00 pm.  Take all am meds, bring all med bottles.  Please allow 24 hrs notice for cancellation/rescheduling of your appointment. Contact information: [address] [place] [address] [place] [address] [phone]        [facility] [employer]. Go on 07/16/2017.   Specialty:  Cardiology Why:  Appointment is to have PT and INR drawn (as is on Coumdain for mitral valve repair;INR 2-3). Appointment time is at 2:30 pm Contact information: [address] [place] [address] [phone]          Signed: Lelon Huh [facility] 07/14/2017, 10:27 AM

## 2017-07-07 NOTE — Anesthesia Procedure Notes (Signed)
Arterial Line Insertion Start/End6/19/2019 7:55 AM, 07/07/2017 7:55 AM Performed by: CRNA  Patient location: Pre-op. Preanesthetic checklist: patient identified, IV checked, site marked, risks and benefits discussed, surgical consent, monitors and equipment checked, pre-op evaluation, timeout performed and anesthesia consent Lidocaine 1% used for infiltration Right, radial was placed Catheter size: 20 Fr Hand hygiene performed  and maximum sterile barriers used   Attempts: 1 Procedure performed without using ultrasound guided technique. Following insertion, dressing applied. Post procedure assessment: normal and unchanged

## 2017-07-07 NOTE — OR Nursing (Signed)
14:10 - 45 minute call to SICU charge nurse 

## 2017-07-07 NOTE — Anesthesia Preprocedure Evaluation (Signed)
Anesthesia Evaluation  Patient identified by MRN, date of birth, ID band Patient awake    Reviewed: Allergy & Precautions, H&P , NPO status , Patient's Chart, lab work & pertinent test results  Airway Mallampati: II   Neck ROM: full    Dental   Pulmonary asthma , sleep apnea , former smoker, PE   breath sounds clear to auscultation       Cardiovascular + CAD, + Past MI, + Cardiac Stents and +CHF  + Valvular Problems/Murmurs MR  Rhythm:regular Rate:Normal     Neuro/Psych  Headaches,    GI/Hepatic   Endo/Other  obese  Renal/GU      Musculoskeletal   Abdominal   Peds  Hematology   Anesthesia Other Findings   Reproductive/Obstetrics                             Anesthesia Physical Anesthesia Plan  ASA: III  Anesthesia Plan: General   Post-op Pain Management:    Induction: Intravenous  PONV Risk Score and Plan: 2 and Ondansetron, Dexamethasone, Midazolam and Treatment may vary due to age or medical condition  Airway Management Planned: Oral ETT  Additional Equipment: Arterial line, CVP, PA Cath, 3D TEE and Ultrasound Guidance Line Placement  Intra-op Plan: Utilization Of Total Body Hypothermia per surgeon request  Post-operative Plan: Post-operative intubation/ventilation  Informed Consent: I have reviewed the patients History and Physical, chart, labs and discussed the procedure including the risks, benefits and alternatives for the proposed anesthesia with the patient or authorized representative who has indicated his/her understanding and acceptance.     Plan Discussed with: CRNA, Anesthesiologist and Surgeon  Anesthesia Plan Comments:         Anesthesia Quick Evaluation

## 2017-07-08 ENCOUNTER — Inpatient Hospital Stay (HOSPITAL_COMMUNITY): Payer: Medicaid Other

## 2017-07-08 ENCOUNTER — Encounter (HOSPITAL_COMMUNITY): Payer: Self-pay | Admitting: Thoracic Surgery (Cardiothoracic Vascular Surgery)

## 2017-07-08 LAB — CBC
HCT: 32.5 % — ABNORMAL LOW (ref 39.0–52.0)
HCT: 32.6 % — ABNORMAL LOW (ref 39.0–52.0)
Hemoglobin: 10.2 g/dL — ABNORMAL LOW (ref 13.0–17.0)
Hemoglobin: 10.2 g/dL — ABNORMAL LOW (ref 13.0–17.0)
MCH: 30.1 pg (ref 26.0–34.0)
MCH: 30.2 pg (ref 26.0–34.0)
MCHC: 31.4 g/dL (ref 30.0–36.0)
MCHC: 31.3 g/dL (ref 30.0–36.0)
MCV: 95.9 fL (ref 78.0–100.0)
MCV: 96.4 fL (ref 78.0–100.0)
PLATELETS: 133 10*3/uL — AB (ref 150–400)
PLATELETS: 130 10*3/uL — AB (ref 150–400)
RBC: 3.39 MIL/uL — AB (ref 4.22–5.81)
RBC: 3.38 MIL/uL — AB (ref 4.22–5.81)
RDW: 12.3 % (ref 11.5–15.5)
RDW: 12.2 % (ref 11.5–15.5)
WBC: 12.7 10*3/uL — AB (ref 4.0–10.5)
WBC: 16 10*3/uL — ABNORMAL HIGH (ref 4.0–10.5)

## 2017-07-08 LAB — BASIC METABOLIC PANEL
Anion gap: 5 (ref 5–15)
BUN: 11 mg/dL (ref 6–20)
CO2: 25 mmol/L (ref 22–32)
Calcium: 7.8 mg/dL — ABNORMAL LOW (ref 8.9–10.3)
Chloride: 108 mmol/L (ref 101–111)
Creatinine, Ser: 1.04 mg/dL (ref 0.61–1.24)
GFR calc Af Amer: >60 mL/min (ref >60)
GLUCOSE: 129 mg/dL — AB (ref 65–99)
POTASSIUM: 4.3 mmol/L (ref 3.5–5.1)
SODIUM: 138 mmol/L (ref 135–145)

## 2017-07-08 LAB — GLUCOSE, CAPILLARY
GLUCOSE-CAPILLARY: 114 mg/dL — AB (ref 65–99)
GLUCOSE-CAPILLARY: 115 mg/dL — AB (ref 65–99)
GLUCOSE-CAPILLARY: 130 mg/dL — AB (ref 65–99)
GLUCOSE-CAPILLARY: 133 mg/dL — AB (ref 65–99)
GLUCOSE-CAPILLARY: 135 mg/dL — AB (ref 65–99)
Glucose-Capillary: 129 mg/dL — ABNORMAL HIGH (ref 65–99)
Glucose-Capillary: 127 mg/dL — ABNORMAL HIGH (ref 65–99)
Glucose-Capillary: 123 mg/dL — ABNORMAL HIGH (ref 65–99)
Glucose-Capillary: 131 mg/dL — ABNORMAL HIGH (ref 65–99)
Glucose-Capillary: 120 mg/dL — ABNORMAL HIGH (ref 65–99)
Glucose-Capillary: 119 mg/dL — ABNORMAL HIGH (ref 65–99)
Glucose-Capillary: 136 mg/dL — ABNORMAL HIGH (ref 65–99)
Glucose-Capillary: 135 mg/dL — ABNORMAL HIGH (ref 65–99)
Glucose-Capillary: 129 mg/dL — ABNORMAL HIGH (ref 65–99)

## 2017-07-08 LAB — POCT I-STAT, CHEM 8
BUN: 15 mg/dL (ref 6–20)
CALCIUM ION: 1.18 mmol/L (ref 1.15–1.40)
Chloride: 99 mmol/L — ABNORMAL LOW (ref 101–111)
Creatinine, Ser: 1.1 mg/dL (ref 0.61–1.24)
GLUCOSE: 132 mg/dL — AB (ref 65–99)
HCT: 31 % — ABNORMAL LOW (ref 39.0–52.0)
HEMOGLOBIN: 10.5 g/dL — AB (ref 13.0–17.0)
POTASSIUM: 4.4 mmol/L (ref 3.5–5.1)
Sodium: 137 mmol/L (ref 135–145)
TCO2: 27 mmol/L (ref 22–32)

## 2017-07-08 LAB — CREATININE, SERUM
Creatinine, Ser: 1.15 mg/dL (ref 0.61–1.24)
GFR calc Af Amer: >60 mL/min (ref >60)
GFR calc non Af Amer: >60 mL/min (ref >60)

## 2017-07-08 LAB — COOXEMETRY PANEL
CARBOXYHEMOGLOBIN: 1.6 % — AB (ref 0.5–1.5)
Methemoglobin: 1.1 % (ref 0.0–1.5)
O2 SAT: 63.7 %
Total hemoglobin: 10.3 g/dL — ABNORMAL LOW (ref 12.0–16.0)

## 2017-07-08 LAB — MAGNESIUM
MAGNESIUM: 2 mg/dL (ref 1.7–2.4)
Magnesium: 2.2 mg/dL (ref 1.7–2.4)

## 2017-07-08 MED ORDER — ENOXAPARIN SODIUM 30 MG/0.3ML ~~LOC~~ SOLN: 30.0000 mg | Freq: Every day | SUBCUTANEOUS | Status: DC

## 2017-07-08 MED ORDER — INSULIN ASPART 100 UNIT/ML ~~LOC~~ SOLN
0.0000 [IU] | SUBCUTANEOUS | Status: DC
  Administered 2017-07-08 (×2): 2 [IU] via SUBCUTANEOUS

## 2017-07-08 MED ORDER — SPIRONOLACTONE 12.5 MG HALF TABLET
12.5000 mg | ORAL_TABLET | Freq: Every day | ORAL | Status: DC
  Administered 2017-07-09 – 2017-07-13 (×5): 12.5 mg via ORAL
  Filled 2017-07-08 (×5): qty 1

## 2017-07-08 MED ORDER — INSULIN GLARGINE 100 UNIT/ML ~~LOC~~ SOLN
20.0000 [IU] | Freq: Two times a day (BID) | SUBCUTANEOUS | Status: DC
  Administered 2017-07-08: 20 [IU] via SUBCUTANEOUS
  Filled 2017-07-08 (×3): qty 0.2

## 2017-07-08 MED ORDER — FENTANYL CITRATE (PF) 100 MCG/2ML IJ SOLN
25.0000 ug | INTRAMUSCULAR | Status: DC | PRN
  Administered 2017-07-08 – 2017-07-13 (×10): 50 ug via INTRAVENOUS
  Filled 2017-07-08 (×10): qty 2

## 2017-07-08 MED ORDER — WARFARIN SODIUM 2.5 MG PO TABS: 2.5000 mg | ORAL_TABLET | Freq: Once | ORAL | Status: DC

## 2017-07-08 MED ORDER — WARFARIN - PHYSICIAN DOSING INPATIENT
Freq: Every day | Status: DC
  Administered 2017-07-11 – 2017-07-12 (×2)

## 2017-07-08 MED ORDER — WARFARIN SODIUM 2.5 MG PO TABS
2.5000 mg | ORAL_TABLET | Freq: Every day | ORAL | Status: DC
  Administered 2017-07-08: 2.5 mg via ORAL
  Filled 2017-07-08: qty 1

## 2017-07-08 MED ORDER — INSULIN ASPART 100 UNIT/ML ~~LOC~~ SOLN: 0.0000 [IU] | SUBCUTANEOUS | Status: DC

## 2017-07-08 MED ORDER — ATORVASTATIN CALCIUM 80 MG PO TABS
80.0000 mg | ORAL_TABLET | Freq: Every day | ORAL | Status: DC
  Administered 2017-07-09 – 2017-07-14 (×6): 80 mg via ORAL
  Filled 2017-07-08 (×6): qty 1

## 2017-07-08 MED ORDER — INSULIN DETEMIR 100 UNIT/ML ~~LOC~~ SOLN: 20.0000 [IU] | Freq: Two times a day (BID) | SUBCUTANEOUS | Status: DC

## 2017-07-08 MED ORDER — ENOXAPARIN SODIUM 40 MG/0.4ML ~~LOC~~ SOLN
40.0000 mg | Freq: Every day | SUBCUTANEOUS | Status: DC
  Administered 2017-07-08 – 2017-07-13 (×6): 40 mg via SUBCUTANEOUS
  Filled 2017-07-08 (×6): qty 0.4

## 2017-07-08 MED ORDER — FENTANYL CITRATE (PF) 100 MCG/2ML IJ SOLN
50.0000 ug | Freq: Once | INTRAMUSCULAR | Status: AC
  Administered 2017-07-08: 50 ug via INTRAVENOUS
  Filled 2017-07-08: qty 2

## 2017-07-08 MED ORDER — INSULIN DETEMIR 100 UNIT/ML ~~LOC~~ SOLN
20.0000 [IU] | Freq: Every day | SUBCUTANEOUS | Status: DC
  Administered 2017-07-08: 20 [IU] via SUBCUTANEOUS
  Filled 2017-07-08: qty 0.2

## 2017-07-08 MED ORDER — ENOXAPARIN SODIUM 40 MG/0.4ML ~~LOC~~ SOLN: 40.0000 mg | Freq: Every day | SUBCUTANEOUS | Status: DC

## 2017-07-08 MED ORDER — FUROSEMIDE 10 MG/ML IJ SOLN
40.0000 mg | Freq: Two times a day (BID) | INTRAMUSCULAR | Status: DC
  Administered 2017-07-08 – 2017-07-10 (×5): 40 mg via INTRAVENOUS
  Filled 2017-07-08 (×4): qty 4

## 2017-07-08 MED ORDER — CARVEDILOL 3.125 MG PO TABS
3.1250 mg | ORAL_TABLET | Freq: Two times a day (BID) | ORAL | Status: DC
  Administered 2017-07-08 – 2017-07-14 (×13): 3.125 mg via ORAL
  Filled 2017-07-08 (×14): qty 1

## 2017-07-08 MED FILL — Potassium Chloride Inj 2 mEq/ML: INTRAVENOUS | Qty: 40 | Status: AC

## 2017-07-08 MED FILL — Heparin Sodium (Porcine) Inj 1000 Unit/ML: INTRAMUSCULAR | Qty: 30 | Status: AC

## 2017-07-08 MED FILL — Magnesium Sulfate Inj 50%: INTRAMUSCULAR | Qty: 10 | Status: AC

## 2017-07-08 MED FILL — Heparin Sodium (Porcine) Inj 1000 Unit/ML: INTRAMUSCULAR | Qty: 2500 | Status: AC

## 2017-07-08 NOTE — Progress Notes (Addendum)
TCTS DAILY ICU PROGRESS NOTE                   [address]            [place] [id number]          [phone]   1 Day Post-Op Procedure(s) (LRB): MINIMALLY INVASIVE MITRAL VALVE REPAIR (MVR) using Carpentier-McCarthy Adams Ring size 26 (Right) TRANSESOPHAGEAL ECHOCARDIOGRAM (TEE) (N/A) PATENT FORAMEN OVALE (PFO) CLOSURE (N/A)  Total Length of Stay:  LOS: 1 day   Subjective:  Patient complaints of pain worsened with breathing and talking.  He is not getting much relief with pain medication and is taking Morphine and oral agents as scheduled.  He denies nausea/vomiting.  He is eating clears this morning.  Objective: Vital signs in last 24 hours: Temp:  [97.5 F (36.4 C)-101.3 F (38.5 C)] 100.2 F (37.9 C) (06/20 0722) Pulse Rate:  [60-128] 128 (06/20 0722) Cardiac Rhythm: Sinus tachycardia (06/20 0730) Resp:  [16-37] 36 (06/20 0722) BP: (78-143)/(47-106) 102/60 (06/20 0722) SpO2:  [93 %-100 %] 94 % (06/20 0722) Arterial Line BP: (82-150)/(48-89) 118/57 (06/20 0722) FiO2 (%):  [40 %-50 %] 40 % (06/19 2010) Weight:  [280 lb 4.8 oz (127.1 kg)] 280 lb 4.8 oz (127.1 kg) (06/20 0600)  Filed Weights   07/07/17 0625 07/08/17 0600  Weight: 269 lb (122 kg) 280 lb 4.8 oz (127.1 kg)    Weight change: 11 lb 4.8 oz (5.126 kg)   Hemodynamic parameters for last 24 hours: PAP: (27-73)/(14-31) 63/31 CO:  [4.2 L/min-8.4 L/min] 7.7 L/min CI:  [1.8 L/min/m2-3.5 L/min/m2] 3.2 L/min/m2  Intake/Output from previous day: 06/19 0701 - 06/20 0700 In: 6454.8 [I.V.:4576.1; Blood:660; IV Piggyback:1218.7] Out: 5650 [Urine:3960; Blood:1100; Chest Tube:590]  Current Meds: Scheduled Meds: . acetaminophen  1,000 mg Oral Q6H  . aspirin EC  325 mg Oral Daily  . bisacodyl  10 mg Oral Daily   Or  . bisacodyl  10 mg Rectal Daily  . Chlorhexidine Gluconate Cloth  6 each Topical Daily  . [START ON 07/09/2017] enoxaparin (LOVENOX) injection  30 mg Subcutaneous QHS  . enoxaparin  (LOVENOX) injection  40 mg Subcutaneous QHS  . fentaNYL (SUBLIMAZE) injection  50 mcg Intravenous Once  . insulin aspart  0-24 Units Subcutaneous Q4H  . insulin aspart  0-24 Units Subcutaneous Q4H  . insulin detemir  20 Units Subcutaneous Daily  . mouth rinse  15 mL Mouth Rinse BID  . [START ON 07/09/2017] pantoprazole  40 mg Oral Daily  . sodium chloride flush  10-40 mL Intracatheter Q12H  . sodium chloride flush  3 mL Intravenous Q12H  . warfarin  2.5 mg Oral q1800  . warfarin  2.5 mg Oral ONCE-1800   Continuous Infusions: . sodium chloride    . albumin human 250 mL (07/07/17 1900)  . cefUROXime (ZINACEF)  IV 200 mL/hr at 07/08/17 0700  . insulin (NOVOLIN-R) infusion 2.1 mL/hr at 07/08/17 0700  . lactated ringers    . lactated ringers 20 mL/hr at 07/08/17 0700  . milrinone 0.375 mcg/kg/min (07/08/17 0730)   PRN Meds:.albumin human, metoprolol tartrate, morphine injection, ondansetron (ZOFRAN) IV, oxyCODONE, sodium chloride flush, sodium chloride flush, traMADol  General appearance: alert, cooperative and no distress Heart: regular rate and rhythm Lungs: clear to auscultation bilaterally Abdomen: soft, non-tender; bowel sounds normal; no masses,  no organomegaly Extremities: edema trace Wound: aquacel in place  Lab Results: CBC: Recent Labs    07/07/17 2137 07/07/17 2139 07/08/17 0348  WBC 12.7*  --  12.7*  HGB 10.4* 10.9* 10.2*  HCT 32.4* 32.0* 32.5*  PLT 126*  --  133*   BMET:  Recent Labs    07/05/17 1112  07/07/17 2139 07/08/17 0348  NA 141   < > 140 138  K 4.1   < > 4.6 4.3  CL 108   < > 104 108  CO2 26  --   --  25  GLUCOSE 107*   < > 128* 129*  BUN 11   < > 11 11  CREATININE 0.97   < > 0.90 1.04  CALCIUM 9.0  --   --  7.8*   < > = values in this interval not displayed.    CMET: Lab Results  Component Value Date   WBC 12.7 (H) 07/08/2017   HGB 10.2 (L) 07/08/2017   HCT 32.5 (L) 07/08/2017   PLT 133 (L) 07/08/2017   GLUCOSE 129 (H) 07/08/2017    CHOL 224 (H) 10/20/2015   TRIG 273 (H) 10/20/2015   HDL 28 (L) 10/20/2015   LDLDIRECT 149.8 11/15/2009   LDLCALC 141 (H) 10/20/2015   ALT 32 07/05/2017   AST 29 07/05/2017   NA 138 07/08/2017   K 4.3 07/08/2017   CL 108 07/08/2017   CREATININE 1.04 07/08/2017   BUN 11 07/08/2017   CO2 25 07/08/2017   TSH 0.674 Test methodology is 3rd generation TSH 04/01/2008   INR 1.01 07/05/2017   HGBA1C 4.9 07/05/2017      PT/INR:  Recent Labs    07/05/17 1112  LABPROT 13.2  INR 1.01   Radiology: Dg Chest Port 1 View  Result Date: 07/07/2017 CLINICAL DATA:  Mitral valve repair EXAM: PORTABLE CHEST 1 VIEW COMPARISON:  Two days ago FINDINGS: Right-sided thoracic drains in place. Swan-Ganz catheter from the left with tip at the right main level. Interval mitral valve repair. Endotracheal tube tip is between the clavicular heads and carina. An orogastric tube tip is at the gastric fundus. Right-sided atelectatic type opacity. The left lung is low volume but clear. No visible pneumo thorax IMPRESSION: 1. Low volume chest with right-sided atelectasis. 2. Unremarkable hardware as described. Electronically Signed   By: Marnee Spring M.D.   On: 07/07/2017 16:14   Assessment/Plan: S/P Procedure(s) (LRB): MINIMALLY INVASIVE MITRAL VALVE REPAIR (MVR) using Carpentier-McCarthy Adams Ring size 26 (Right) TRANSESOPHAGEAL ECHOCARDIOGRAM (TEE) (N/A) PATENT FORAMEN OVALE (PFO) CLOSURE (N/A)  1. CV- Sinus Tach, BP okay.. On Milrinone at 0.375, will wean as hemodynamics allow 2. Pulm- CXR with mild bibasilar atelectasis, wean oxygen as tolerated, leave chest tubes in place until output is less 200 for 24 hours 3. Renal- creatinine at 1.04, he is volume overloaded, can possibly start low dose Lasix later today 4. Expected post operative blood loss anemia, mild Hgb at 10.2 5. Expected post operative Thrombocytopenia, mild 6. CBGs controlled, stop insulin later today, start SSIP 7. Pain control- not much  relief with oral medications or IV Morphine will try Fentanyl, no Toradol with mild elevation in creatinine 8. Dispo- patient stable, wean Milrinone as able, work on pain control this morning, POD #1 progression orders     Lowella Dandy 07/08/2017 8:09 AM    I have seen and examined the patient and agree with the assessment and plan as outlined.  Overall doing well POD1.  Wean milrinone very slowly.  Mobilize.  Diuresis.  Start Coumadin slowly.  Purcell Nails, MD 07/08/2017 8:20 AM

## 2017-07-08 NOTE — Progress Notes (Signed)
Patient ID: Joshua May, male   DOB: 1975-11-26, 42 y.o.   MRN: [id number]  TCTS Evening Rounds:   Hemodynamically stable on milrinone 0.25 Sinus tachy 118  Urine output good  CT output low  CBC    Component Value Date/Time   WBC 16.0 (H) 07/08/2017 1700   RBC 3.38 (L) 07/08/2017 1700   HGB 10.2 (L) 07/08/2017 1700   HCT 32.6 (L) 07/08/2017 1700   PLT 130 (L) 07/08/2017 1700   MCV 96.4 07/08/2017 1700   MCH 30.2 07/08/2017 1700   MCHC 31.3 07/08/2017 1700   RDW 12.2 07/08/2017 1700   LYMPHSABS 1.7 10/20/2015 1144   MONOABS 0.6 10/20/2015 1144   EOSABS 0.0 10/20/2015 1144   BASOSABS 0.0 10/20/2015 1144     BMET    Component Value Date/Time   NA 137 07/08/2017 1653   K 4.4 07/08/2017 1653   CL 99 (L) 07/08/2017 1653   CO2 25 07/08/2017 0348   GLUCOSE 132 (H) 07/08/2017 1653   BUN 15 07/08/2017 1653   CREATININE 1.15 07/08/2017 1700   CREATININE 1.22 11/01/2015 1213   CALCIUM 7.8 (L) 07/08/2017 0348   GFRNONAA >60 07/08/2017 1700   GFRAA >60 07/08/2017 1700     A/P:  Stable postop course. Continue current plans

## 2017-07-09 ENCOUNTER — Other Ambulatory Visit: Payer: Self-pay

## 2017-07-09 ENCOUNTER — Inpatient Hospital Stay (HOSPITAL_COMMUNITY): Payer: Medicaid Other

## 2017-07-09 ENCOUNTER — Inpatient Hospital Stay: Payer: Self-pay

## 2017-07-09 LAB — BASIC METABOLIC PANEL
Anion gap: 5 (ref 5–15)
BUN: 15 mg/dL (ref 6–20)
CALCIUM: 8.2 mg/dL — AB (ref 8.9–10.3)
CO2: 28 mmol/L (ref 22–32)
CREATININE: 1.07 mg/dL (ref 0.61–1.24)
Chloride: 102 mmol/L (ref 101–111)
GFR calc non Af Amer: >60 mL/min (ref >60)
GLUCOSE: 118 mg/dL — AB (ref 65–99)
Potassium: 4.5 mmol/L (ref 3.5–5.1)
Sodium: 135 mmol/L (ref 135–145)

## 2017-07-09 LAB — CBC
HCT: 32.3 % — ABNORMAL LOW (ref 39.0–52.0)
Hemoglobin: 9.9 g/dL — ABNORMAL LOW (ref 13.0–17.0)
MCH: 29.6 pg (ref 26.0–34.0)
MCHC: 30.7 g/dL (ref 30.0–36.0)
MCV: 96.7 fL (ref 78.0–100.0)
PLATELETS: 122 10*3/uL — AB (ref 150–400)
RBC: 3.34 MIL/uL — AB (ref 4.22–5.81)
RDW: 12.1 % (ref 11.5–15.5)
WBC: 17.8 10*3/uL — ABNORMAL HIGH (ref 4.0–10.5)

## 2017-07-09 LAB — GLUCOSE, CAPILLARY
GLUCOSE-CAPILLARY: 113 mg/dL — AB (ref 65–99)
GLUCOSE-CAPILLARY: 101 mg/dL — AB (ref 65–99)
GLUCOSE-CAPILLARY: 89 mg/dL (ref 65–99)
Glucose-Capillary: 102 mg/dL — ABNORMAL HIGH (ref 65–99)
Glucose-Capillary: 104 mg/dL — ABNORMAL HIGH (ref 65–99)
Glucose-Capillary: 97 mg/dL (ref 65–99)
Glucose-Capillary: 97 mg/dL (ref 65–99)

## 2017-07-09 LAB — POCT I-STAT, CHEM 8
BUN: 12 mg/dL (ref 6–20)
CREATININE: 0.8 mg/dL (ref 0.61–1.24)
Calcium, Ion: 1.07 mmol/L — ABNORMAL LOW (ref 1.15–1.40)
Chloride: 104 mmol/L (ref 101–111)
GLUCOSE: 181 mg/dL — AB (ref 65–99)
HCT: 26 % — ABNORMAL LOW (ref 39.0–52.0)
HEMOGLOBIN: 8.8 g/dL — AB (ref 13.0–17.0)
Potassium: 6.3 mmol/L (ref 3.5–5.1)
Sodium: 136 mmol/L (ref 135–145)
TCO2: 25 mmol/L (ref 22–32)

## 2017-07-09 LAB — COOXEMETRY PANEL
CARBOXYHEMOGLOBIN: 1.5 % (ref 0.5–1.5)
Methemoglobin: 1.4 % (ref 0.0–1.5)
O2 SAT: 86.6 %
TOTAL HEMOGLOBIN: 10.4 g/dL — AB (ref 12.0–16.0)

## 2017-07-09 LAB — PROTIME-INR
INR: 1.32
PROTHROMBIN TIME: 16.3 s — AB (ref 11.4–15.2)

## 2017-07-09 IMAGING — DX DG CHEST 1V PORT
1 series · 1 of 1 positions shown · non-contrast
Comparison: [DATE].  [DATE].

CLINICAL DATA: Atelectasis.

EXAM:
PORTABLE CHEST 1 VIEW

[chest ap]
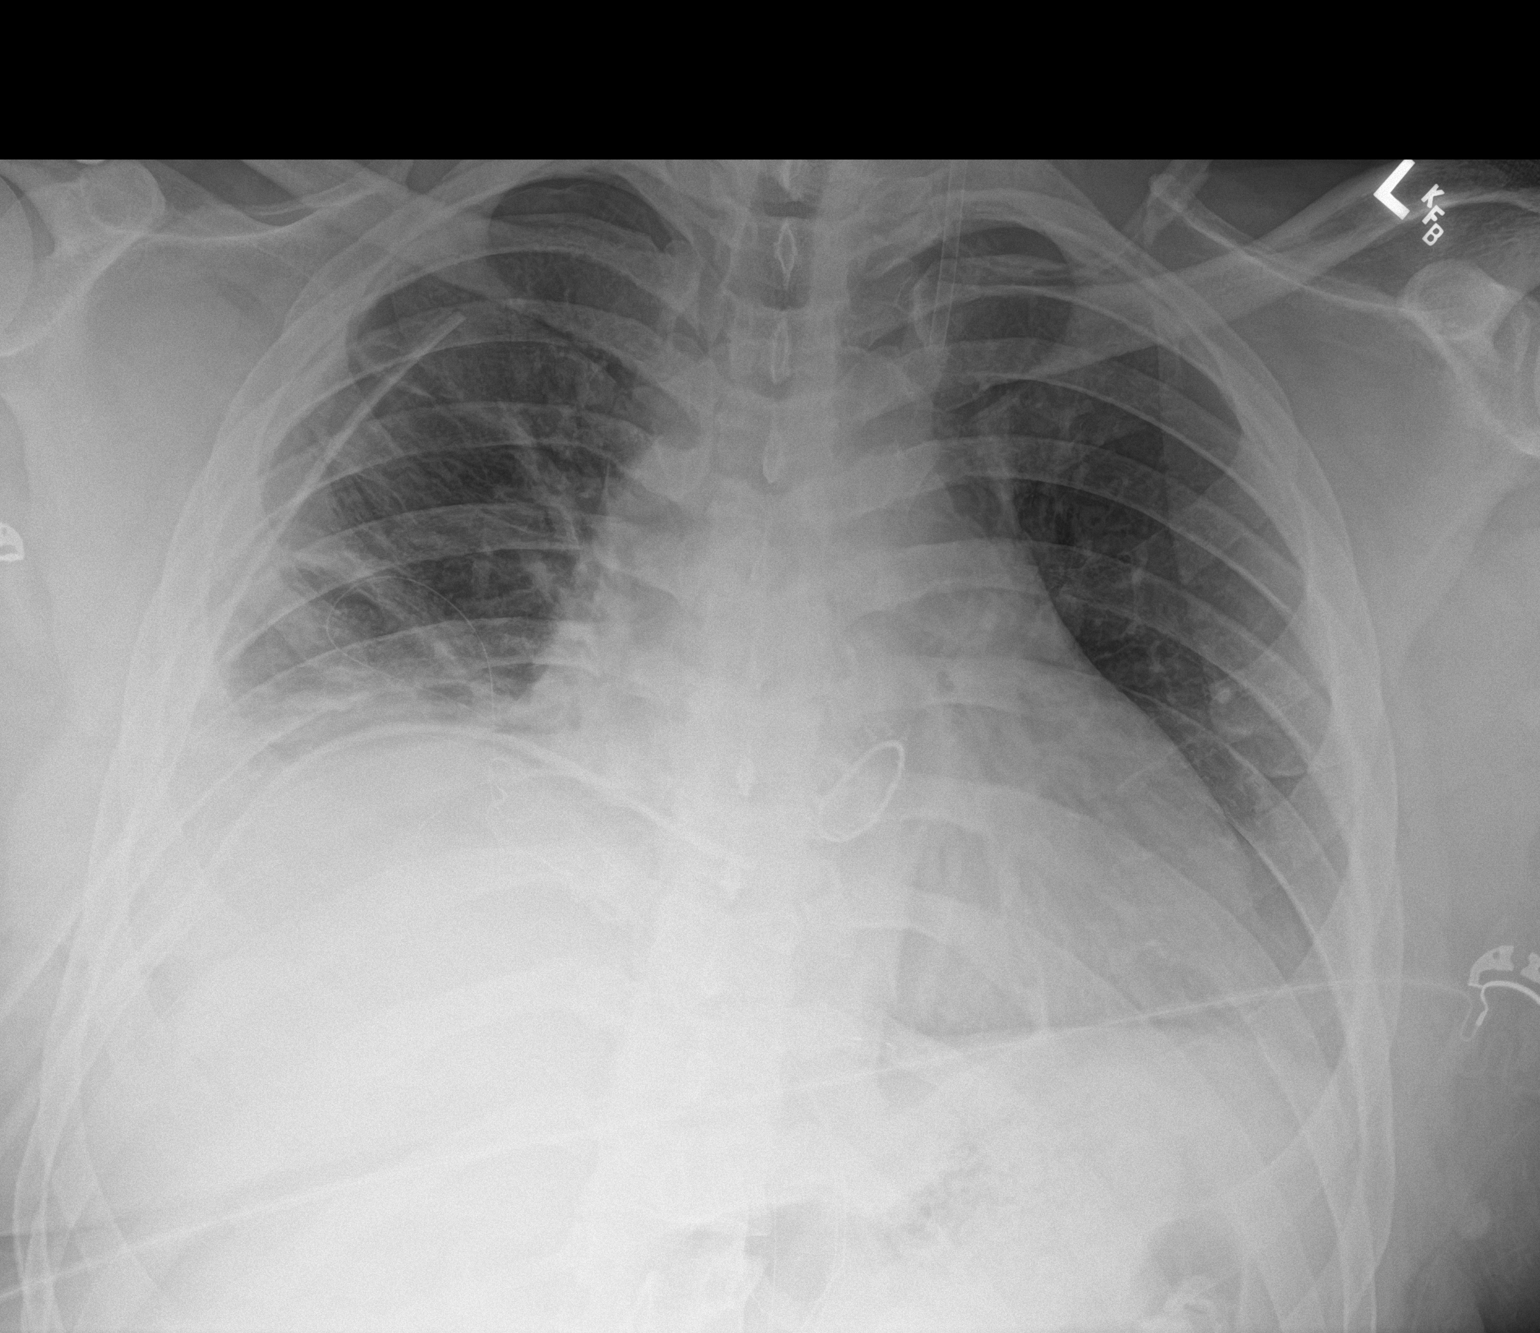

[1 of 1 positions shown; findings below may reference images not displayed]

FINDINGS: Interim removal of Swan-Ganz catheter. Left IJ sheath noted kinked
in the neck. Two right chest tubes in stable position. Stable
cardiomegaly. Cardiac valve replacement. Persistent right base
atelectatic changes. Tiny right apical pneumothorax noted on today's
exam.
IMPRESSION: 1. Two right chest tubes noted stable position. Tiny right apical
pneumothorax noted on today's exam.

Interim removal Swan-Ganz catheter.  Left IJ sheath keen and neck.

3.  Persistent right base atelectatic changes.

4.  Stable cardiomegaly.  Prior cardiac valve replacement.

Critical Value/emergent results were called by telephone at the time
of interpretation on [DATE] at [DATE] to nurse PK, who
verbally acknowledged these results.

## 2017-07-09 MED ORDER — SODIUM CHLORIDE 0.9% FLUSH
10.0000 mL | Freq: Two times a day (BID) | INTRAVENOUS | Status: DC
  Administered 2017-07-10 – 2017-07-12 (×4): 10 mL

## 2017-07-09 MED ORDER — INSULIN ASPART 100 UNIT/ML ~~LOC~~ SOLN
0.0000 [IU] | Freq: Three times a day (TID) | SUBCUTANEOUS | Status: DC
  Administered 2017-07-10: 2 [IU] via SUBCUTANEOUS

## 2017-07-09 MED ORDER — CHLORHEXIDINE GLUCONATE CLOTH 2 % EX PADS
6.0000 | MEDICATED_PAD | Freq: Every day | CUTANEOUS | Status: DC
  Administered 2017-07-09 – 2017-07-12 (×4): 6 via TOPICAL

## 2017-07-09 MED ORDER — WARFARIN SODIUM 5 MG PO TABS
5.0000 mg | ORAL_TABLET | Freq: Every day | ORAL | Status: DC
  Administered 2017-07-09 – 2017-07-10 (×2): 5 mg via ORAL
  Filled 2017-07-09 (×3): qty 1

## 2017-07-09 MED ORDER — DM-GUAIFENESIN ER 30-600 MG PO TB12
1.0000 | ORAL_TABLET | Freq: Two times a day (BID) | ORAL | Status: AC
  Administered 2017-07-09 – 2017-07-11 (×5): 1 via ORAL
  Filled 2017-07-09 (×6): qty 1

## 2017-07-09 MED ORDER — KETOROLAC TROMETHAMINE 15 MG/ML IJ SOLN
15.0000 mg | Freq: Four times a day (QID) | INTRAMUSCULAR | Status: AC
  Administered 2017-07-09 – 2017-07-10 (×5): 15 mg via INTRAVENOUS
  Filled 2017-07-09 (×5): qty 1

## 2017-07-09 MED ORDER — SODIUM CHLORIDE 0.9% FLUSH
10.0000 mL | INTRAVENOUS | Status: DC | PRN
  Administered 2017-07-09: 10 mL
  Filled 2017-07-09: qty 40

## 2017-07-09 MED ORDER — MOVING RIGHT ALONG BOOK
Freq: Once | Status: DC
  Filled 2017-07-09: qty 1

## 2017-07-09 MED ORDER — ASPIRIN EC 81 MG PO TBEC
81.0000 mg | DELAYED_RELEASE_TABLET | Freq: Every day | ORAL | Status: DC
  Administered 2017-07-09 – 2017-07-14 (×6): 81 mg via ORAL
  Filled 2017-07-09 (×6): qty 1

## 2017-07-09 NOTE — Progress Notes (Signed)
TCTS BRIEF SICU PROGRESS NOTE  2 Days Post-Op  S/P Procedure(s) (LRB): MINIMALLY INVASIVE MITRAL VALVE REPAIR (MVR) using Carpentier-McCarthy Adams Ring size 26 (Right) TRANSESOPHAGEAL ECHOCARDIOGRAM (TEE) (N/A) PATENT FORAMEN OVALE (PFO) CLOSURE (N/A)   Stable day. Pain much improved NSR/sinus tach Stable BP Breathing comfortably w/ O2 sats 99-100%  Plan: Continue current plan  [name], MD 07/09/2017 7:11 PM

## 2017-07-09 NOTE — Significant Event (Signed)
EPWs removed without complications. No bleeding or tissues noted on end tips. Patient is bedrest at this time.    Joshua May

## 2017-07-09 NOTE — Progress Notes (Signed)
[address]       [place] [id number]             [phone]        CARDIOTHORACIC SURGERY PROGRESS NOTE   R2 Days Post-Op Procedure(s) (LRB): MINIMALLY INVASIVE MITRAL VALVE REPAIR (MVR) using Carpentier-McCarthy Adams Ring size 26 (Right) TRANSESOPHAGEAL ECHOCARDIOGRAM (TEE) (N/A) PATENT FORAMEN OVALE (PFO) CLOSURE (N/A)  Subjective: Reports expected soreness across chest but much improved.  No SOB.  No nausea.  Objective: Vital signs: BP Readings from Last 1 Encounters:  07/09/17 127/76   Pulse Readings from Last 1 Encounters:  07/09/17 (!) 120   Resp Readings from Last 1 Encounters:  07/09/17 16   Temp Readings from Last 1 Encounters:  07/09/17 98.7 F (37.1 C) (Oral)    Hemodynamics: PAP: (68)/(31) 68/31  Physical Exam:  Rhythm:   sinus  Breath sounds: clear  Heart sounds:  RRR w/out murmur  Incisions:  Dressing dry, intact  Abdomen:  Soft, non-distended, non-tender  Extremities:  Warm, well-perfused  Chest tubes:  low volume thin serosanguinous output, no air leak    Intake/Output from previous day: 06/20 0701 - 06/21 0700 In: 1875.8 [P.O.:850; I.V.:755.5; IV Piggyback:270.3] Out: 2845 [Urine:2535; Chest Tube:310] Intake/Output this shift: No intake/output data recorded.  Lab Results:  CBC: Recent Labs    07/08/17 1700 07/09/17 0452  WBC 16.0* 17.8*  HGB 10.2* 9.9*  HCT 32.6* 32.3*  PLT 130* 122*    BMET:  Recent Labs    07/08/17 0348 07/08/17 1653 07/08/17 1700 07/09/17 0452  NA 138 137  --  135  K 4.3 4.4  --  4.5  CL 108 99*  --  102  CO2 25  --   --  28  GLUCOSE 129* 132*  --  118*  BUN 11 15  --  15  CREATININE 1.04 1.10 1.15 1.07  CALCIUM 7.8*  --   --  8.2*     PT/INR:   Recent Labs    07/09/17 0452  LABPROT 16.3*  INR 1.32    CBG (last 3)  Recent Labs    07/08/17 1940 07/08/17 2345 07/09/17 0734  GLUCAP 129* 102* 113*    ABG    Component Value Date/Time   PHART 7.300 (L)  07/07/2017 2147   PCO2ART 45.4 07/07/2017 2147   PO2ART 86.0 07/07/2017 2147   HCO3 22.0 07/07/2017 2147   TCO2 27 07/08/2017 1653   ACIDBASEDEF 4.0 (H) 07/07/2017 2147   O2SAT 86.6 07/09/2017 0520    CXR: PORTABLE CHEST 1 VIEW  COMPARISON:  07/08/2017.  07/07/2017.  FINDINGS: Interim removal of Swan-Ganz catheter. Left IJ sheath noted kinked in the neck. Two right chest tubes in stable position. Stable cardiomegaly. Cardiac valve replacement. Persistent right base atelectatic changes. Tiny right apical pneumothorax noted on today's exam.  IMPRESSION: 1. Two right chest tubes noted stable position. Tiny right apical pneumothorax noted on today's exam.  Interim removal Swan-Ganz catheter.  Left IJ sheath keen and neck.  3.  Persistent right base atelectatic changes.  4.  Stable cardiomegaly.  Prior cardiac valve replacement.  Critical Value/emergent results were called by telephone at the time of interpretation on 07/09/2017 at 6:58 am to nurse Abby, who verbally acknowledged these results.   Electronically Signed   By: Maisie Fushomas  Register   On: 07/09/2017 07:04  Assessment/Plan: S/P Procedure(s) (LRB): MINIMALLY INVASIVE MITRAL VALVE REPAIR (MVR) using Carpentier-McCarthy Adams Ring size 26 (Right) TRANSESOPHAGEAL ECHOCARDIOGRAM (TEE) (N/A)  PATENT FORAMEN OVALE (PFO) CLOSURE (N/A)  Doing well POD2 Maintaining NSR w/ stable BP on milrinone 0.25 - coox 87% (doubt accurate) Breathing comfortably w/ O2 sats 100% and clear CXR Acute on chronic systolic CHF with expected post-op volume excess, I/O's negative 1 liter yesterday, weight not recorded Expected post op acute blood loss anemia, mild, Hgb 9.9 this morning Expected post op atelectasis, mild Post op thrombocytopenia, mild Ischemic cardiomyopathy CAD w/out angina   Wean milrinone slowly  Insert PICC and d/c sheath  Mobilize  Continue low dose Coreg  Possibly restart losartan tomorrow   Continue  ASA, statin  Coumadin x 3 months for valve repair   Purcell Nails, MD 07/09/2017 7:58 AM

## 2017-07-09 NOTE — Progress Notes (Signed)
Peripherally Inserted Central Catheter/Midline Placement  The IV Nurse has discussed with the patient and/or persons authorized to consent for the patient, the purpose of this procedure and the potential benefits and risks involved with this procedure.  The benefits include less needle sticks, lab draws from the catheter, and the patient may be discharged home with the catheter. Risks include, but not limited to, infection, bleeding, blood clot (thrombus formation), and puncture of an artery; nerve damage and irregular heartbeat and possibility to perform a PICC exchange if needed/ordered by physician.  Alternatives to this procedure were also discussed.  Bard Power PICC patient education guide, fact sheet on infection prevention and patient information card has been provided to patient /or left at bedside.    PICC/Midline Placement Documentation  PICC Double Lumen 07/09/17 PICC Left Basilic 50 cm 0 cm (Active)  Indication for Insertion or Continuance of Line Vasoactive infusions 07/09/2017 12:21 PM  Exposed Catheter (cm) 0 cm 07/09/2017 12:21 PM  Site Assessment Clean;Dry;Intact 07/09/2017 12:21 PM  Lumen #1 Status Flushed;Saline locked;Blood return noted 07/09/2017 12:21 PM  Lumen #2 Status Flushed;Saline locked;Blood return noted 07/09/2017 12:21 PM  Dressing Type Transparent 07/09/2017 12:21 PM  Dressing Status Clean;Dry;Intact;Antimicrobial disc in place 07/09/2017 12:21 PM  Dressing Change Due 07/16/17 07/09/2017 12:21 PM       Jalessa Peyser, Lajean ManesKerry Loraine 07/09/2017, 12:21 PM

## 2017-07-10 LAB — CBC
HEMATOCRIT: 29.5 % — AB (ref 39.0–52.0)
HEMOGLOBIN: 9.1 g/dL — AB (ref 13.0–17.0)
MCH: 29.6 pg (ref 26.0–34.0)
MCHC: 30.8 g/dL (ref 30.0–36.0)
MCV: 96.1 fL (ref 78.0–100.0)
Platelets: 121 10*3/uL — ABNORMAL LOW (ref 150–400)
RBC: 3.07 MIL/uL — ABNORMAL LOW (ref 4.22–5.81)
RDW: 11.9 % (ref 11.5–15.5)
WBC: 8 10*3/uL (ref 4.0–10.5)

## 2017-07-10 LAB — PROTIME-INR
INR: 1.22
Prothrombin Time: 15.3 seconds — ABNORMAL HIGH (ref 11.4–15.2)

## 2017-07-10 LAB — GLUCOSE, CAPILLARY
GLUCOSE-CAPILLARY: 122 mg/dL — AB (ref 65–99)
GLUCOSE-CAPILLARY: 91 mg/dL (ref 65–99)

## 2017-07-10 LAB — BASIC METABOLIC PANEL
Anion gap: 6 (ref 5–15)
BUN: 24 mg/dL — ABNORMAL HIGH (ref 6–20)
CHLORIDE: 103 mmol/L (ref 101–111)
CO2: 29 mmol/L (ref 22–32)
CREATININE: 1.14 mg/dL (ref 0.61–1.24)
Calcium: 8.1 mg/dL — ABNORMAL LOW (ref 8.9–10.3)
GFR calc non Af Amer: >60 mL/min (ref >60)
GLUCOSE: 101 mg/dL — AB (ref 65–99)
Potassium: 3.5 mmol/L (ref 3.5–5.1)
Sodium: 138 mmol/L (ref 135–145)

## 2017-07-10 LAB — COOXEMETRY PANEL
Carboxyhemoglobin: 1.4 % (ref 0.5–1.5)
Methemoglobin: 1.6 % — ABNORMAL HIGH (ref 0.0–1.5)
O2 SAT: 67 %
TOTAL HEMOGLOBIN: 9.5 g/dL — AB (ref 12.0–16.0)

## 2017-07-10 MED ORDER — FA-PYRIDOXINE-CYANOCOBALAMIN 2.5-25-2 MG PO TABS
1.0000 | ORAL_TABLET | Freq: Every day | ORAL | Status: DC
  Administered 2017-07-10 – 2017-07-13 (×4): 1 via ORAL
  Filled 2017-07-10 (×4): qty 1

## 2017-07-10 MED ORDER — POTASSIUM CHLORIDE 10 MEQ/50ML IV SOLN
10.0000 meq | INTRAVENOUS | Status: AC
  Administered 2017-07-10 (×3): 10 meq via INTRAVENOUS
  Filled 2017-07-10 (×3): qty 50

## 2017-07-10 MED ORDER — FUROSEMIDE 40 MG PO TABS
40.0000 mg | ORAL_TABLET | Freq: Every day | ORAL | Status: DC
  Administered 2017-07-11 – 2017-07-13 (×3): 40 mg via ORAL
  Filled 2017-07-10 (×3): qty 1

## 2017-07-10 MED ORDER — POTASSIUM CHLORIDE CRYS ER 20 MEQ PO TBCR
20.0000 meq | EXTENDED_RELEASE_TABLET | Freq: Every day | ORAL | Status: DC
  Administered 2017-07-11 – 2017-07-14 (×4): 20 meq via ORAL
  Filled 2017-07-10 (×4): qty 1

## 2017-07-10 MED ORDER — LOSARTAN POTASSIUM 25 MG PO TABS
12.5000 mg | ORAL_TABLET | Freq: Every day | ORAL | Status: DC
  Administered 2017-07-10 – 2017-07-14 (×5): 12.5 mg via ORAL
  Filled 2017-07-10 (×5): qty 1

## 2017-07-10 NOTE — Progress Notes (Signed)
Pt transferred to 4E01 via wheelchair on telemetry monitoring. Pt's VSS and tolerated transfer well. Pt assisted to bed and left in company of 4E RN

## 2017-07-10 NOTE — Progress Notes (Signed)
      [address]       [place] [id number]             [phone]        CARDIOTHORACIC SURGERY PROGRESS NOTE   R3 Days Post-Op Procedure(s) (LRB): MINIMALLY INVASIVE MITRAL VALVE REPAIR (MVR) using Carpentier-McCarthy Adams Ring size 26 (Right) TRANSESOPHAGEAL ECHOCARDIOGRAM (TEE) (N/A) PATENT FORAMEN OVALE (PFO) CLOSURE (N/A)  Subjective: Looks good and feels pretty well.  Slept some overnight.  Soreness much improved.  No SOB.  Appetite good.  Objective: Vital signs: BP Readings from Last 1 Encounters:  07/10/17 121/75   Pulse Readings from Last 1 Encounters:  07/10/17 (!) 108   Resp Readings from Last 1 Encounters:  07/10/17 14   Temp Readings from Last 1 Encounters:  07/10/17 98.6 F (37 C) (Oral)    Hemodynamics:   Mixed venous co-ox 67%   Physical Exam:  Rhythm:   Sinus tach  Breath sounds: clear  Heart sounds:  RRR w/out murmur  Incisions:  Dressings dry, intact  Abdomen:  Soft, non-distended, non-tender  Extremities:  Warm, well-perfused  Chest tubes:  lo volume thin serosanguinous output, no air leak    Intake/Output from previous day: 06/21 0701 - 06/22 0700 In: 709.9 [P.O.:240; I.V.:369.9; IV Piggyback:100] Out: 2145 [Urine:1945; Chest Tube:200] Intake/Output this shift: No intake/output data recorded.  Lab Results:  CBC: Recent Labs    07/09/17 0452 07/10/17 0402  WBC 17.8* 8.0  HGB 9.9* 9.1*  HCT 32.3* 29.5*  PLT 122* 121*    BMET:  Recent Labs    07/09/17 0452 07/10/17 0402  NA 135 138  K 4.5 3.5  CL 102 103  CO2 28 29  GLUCOSE 118* 101*  BUN 15 24*  CREATININE 1.07 1.14  CALCIUM 8.2* 8.1*     PT/INR:   Recent Labs    07/10/17 0402  LABPROT 15.3*  INR 1.22    CBG (last 3)  Recent Labs    07/09/17 1535 07/09/17 2207 07/10/17 0655  GLUCAP 104* 89 122*    ABG    Component Value Date/Time   PHART 7.300 (L) 07/07/2017 2147   PCO2ART 45.4 07/07/2017 2147   PO2ART 86.0 07/07/2017 2147     HCO3 22.0 07/07/2017 2147   TCO2 27 07/08/2017 1653   ACIDBASEDEF 4.0 (H) 07/07/2017 2147   O2SAT 67.0 07/10/2017 0420    CXR: n/a  Assessment/Plan: S/P Procedure(s) (LRB): MINIMALLY INVASIVE MITRAL VALVE REPAIR (MVR) using Carpentier-McCarthy Adams Ring size 26 (Right) TRANSESOPHAGEAL ECHOCARDIOGRAM (TEE) (N/A) PATENT FORAMEN OVALE (PFO) CLOSURE (N/A)  Doing very well POD3 Maintaining NSR w/ stable BP and co-ox 67% on milrinone 0.125 Breathing comfortably w/ O2 sats 100% on RA Acute on chronic systolic CHF with expected post-op volume excess, I/O's negative 1.4 liters yesterday, weight down 11 lbs - at preop baseline Expected post op acute blood loss anemia, mild, Hgb stable 9.1 this morning Expected post op atelectasis, mild Post op thrombocytopenia, mild, stable Hypokalemia, induced by loop diuretics Ischemic cardiomyopathy CAD w/out angina   D/C chest tubes  Mobilize  Wean milrinone off  Continue low dose Coreg  Restart losartan  Continue ASA, statin  Coumadin x 3 months for valve repair  Transfer 4E   Purcell Nailslarence H Brileigh Sevcik, MD 07/10/2017 8:17 AM

## 2017-07-10 NOTE — Social Work (Signed)
CSW acknowledging consult for Medicaid and SSI.  Referral given to Christia ReadingJessica Wright with [employer], made them aware of pt's likely transfer to 4E. CSW signing off. Please consult if any additional needs arise.  Doy HutchingIsabel H Saivon Prowse, LCSWA [facility] Clinical Social Work [phone]

## 2017-07-10 NOTE — Progress Notes (Signed)
07/10/2017 1500 Received pt to room 4E01 from 2H.  Pt is A&O and with some pain in his r side of chest where incision and chest tube sites are.  Tele monitor applied and CCMD notified.  CHG completed.  Oriented to room, callight and bed.  Family at bedside.Kathryne HitchAllen, Adajah Cocking C

## 2017-07-11 ENCOUNTER — Inpatient Hospital Stay (HOSPITAL_COMMUNITY): Payer: Medicaid Other

## 2017-07-11 LAB — BASIC METABOLIC PANEL
ANION GAP: 7 (ref 5–15)
BUN: 20 mg/dL (ref 6–20)
CHLORIDE: 103 mmol/L (ref 101–111)
CO2: 27 mmol/L (ref 22–32)
Calcium: 8.3 mg/dL — ABNORMAL LOW (ref 8.9–10.3)
Creatinine, Ser: 1 mg/dL (ref 0.61–1.24)
GFR calc Af Amer: >60 mL/min (ref >60)
GFR calc non Af Amer: >60 mL/min (ref >60)
GLUCOSE: 94 mg/dL (ref 65–99)
POTASSIUM: 3.8 mmol/L (ref 3.5–5.1)
SODIUM: 137 mmol/L (ref 135–145)

## 2017-07-11 LAB — BRAIN NATRIURETIC PEPTIDE: B Natriuretic Peptide: 567.1 pg/mL — ABNORMAL HIGH (ref 0.0–100.0)

## 2017-07-11 LAB — PROTIME-INR
INR: 1.11
Prothrombin Time: 14.2 seconds (ref 11.4–15.2)

## 2017-07-11 MED ORDER — WARFARIN SODIUM 7.5 MG PO TABS
7.5000 mg | ORAL_TABLET | Freq: Every day | ORAL | Status: DC
  Administered 2017-07-11 – 2017-07-12 (×2): 7.5 mg via ORAL
  Filled 2017-07-11 (×2): qty 1

## 2017-07-11 NOTE — Progress Notes (Addendum)
      [address]       [place] [id number]             [phone]      Subjective:   No new complaints.  Still having some pain, but its getting better.  + ambulation  + BM  Objective:  Vital Signs in the last 24 hours: Temp:  [98 F (36.7 C)-98.7 F (37.1 C)] 98.3 F (36.8 C) (06/23 0515) Pulse Rate:  [102-108] 106 (06/23 1110) Resp:  [16-20] 19 (06/23 1110) BP: (121-153)/(68-86) 121/86 (06/23 1110) SpO2:  [98 %-100 %] 98 % (06/23 1110) Weight:  [266 lb 4.8 oz (120.8 kg)] 266 lb 4.8 oz (120.8 kg) (06/23 0515)  Intake/Output from previous day: 06/22 0701 - 06/23 0700 In: 279.2 [I.V.:29.2; IV Piggyback:250] Out: 635 [Urine:575; Chest Tube:60] Intake/Output from this shift: Total I/O In: 130 [P.O.:120; I.V.:10] Out: 600 [Urine:600]  Physical Exam: General appearance: alert, cooperative and no distress Lungs: clear to auscultation bilaterally Heart: regular rate and rhythm Abdomen: soft, non-tender; bowel sounds normal; no masses,  no organomegaly Extremities: edema trace Wound: clean and dry   Lab Results: Recent Labs    07/09/17 0452 07/10/17 0402  WBC 17.8* 8.0  HGB 9.9* 9.1*  PLT 122* 121*   Recent Labs    07/10/17 0402 07/11/17 0527  NA 138 137  K 3.5 3.8  CL 103 103  CO2 29 27  GLUCOSE 101* 94  BUN 24* 20  CREATININE 1.14 1.00    Assessment/Plan:   1. CV- NSR, BP okay- continue Coreg, Cozaar 2. Pulm- no acute issues, trace pneumothorax is stable 3. INR 1.11, will increase coumadin to 7.5 mg daily 4. Renal- creatinine stable, BNP 536, continue diuretics 5. Dispo- patient stable, increase coumadin, CXR is okay stable pneumothorax, continue diuretics, possibly ready for d/c in next 24-48 hrs   LOS: 4 days    Erin Barrett PA-C 07/11/2017, 2:13 PM  I have seen and examined the patient and agree with the assessment and plan as outlined.  Making good progress.  [name], MD 07/11/2017 2:22 PM

## 2017-07-12 ENCOUNTER — Inpatient Hospital Stay (HOSPITAL_COMMUNITY): Payer: Medicaid Other

## 2017-07-12 DIAGNOSIS — I361 Nonrheumatic tricuspid (valve) insufficiency: Secondary | ICD-10-CM

## 2017-07-12 LAB — PROTIME-INR
INR: 1.18
Prothrombin Time: 14.9 seconds (ref 11.4–15.2)

## 2017-07-12 LAB — COOXEMETRY PANEL
Carboxyhemoglobin: 1.2 % (ref 0.5–1.5)
Methemoglobin: 1.4 % (ref 0.0–1.5)
O2 Saturation: 54.4 %
TOTAL HEMOGLOBIN: 10.8 g/dL — AB (ref 12.0–16.0)

## 2017-07-12 LAB — BASIC METABOLIC PANEL
Anion gap: 7 (ref 5–15)
BUN: 19 mg/dL (ref 6–20)
CHLORIDE: 104 mmol/L (ref 101–111)
CO2: 29 mmol/L (ref 22–32)
Calcium: 8.6 mg/dL — ABNORMAL LOW (ref 8.9–10.3)
Creatinine, Ser: 1.1 mg/dL (ref 0.61–1.24)
GFR calc Af Amer: >60 mL/min (ref >60)
GFR calc non Af Amer: >60 mL/min (ref >60)
GLUCOSE: 100 mg/dL — AB (ref 65–99)
POTASSIUM: 4 mmol/L (ref 3.5–5.1)
Sodium: 140 mmol/L (ref 135–145)

## 2017-07-12 LAB — ECHOCARDIOGRAM COMPLETE
Height: 71 in
Weight: 4169.6 oz

## 2017-07-12 MED ORDER — PERFLUTREN LIPID MICROSPHERE
3.0000 mL | INTRAVENOUS | Status: AC | PRN
  Administered 2017-07-12: 3 mL via INTRAVENOUS
  Filled 2017-07-12: qty 4

## 2017-07-12 NOTE — Progress Notes (Signed)
07/12/2017 1600 Pt ambulated 470 ft on RA with rolling walker.  Pt tolerated well. Kathryne HitchAllen, Rigdon Macomber C

## 2017-07-12 NOTE — Discharge Instructions (Addendum)
Activity: 1.May walk up steps                2.No lifting more than ten pounds for two weeks.                 3.No driving for two weeks.                4.Stop any activity that causes chest pain, shortness of breath, dizziness, sweating or excessive weakness.                5.Avoid straining.                6.Continue with your breathing exercises daily.  Diet: Heart healthy (low sodium) diet  Wound Care: May shower.  Clean wounds with mild soap and water daily. Contact the office at [phone] if any problems arise.   Information on my medicine - Coumadin   (Warfarin)  This medication education was reviewed with me or my healthcare representative as part of my discharge preparation.  The pharmacist that spoke with me during my hospital stay was:  Sampson SiMancheril, Benjamin G, [facility]  Why was Coumadin prescribed for you? Coumadin was prescribed for you because you have a blood clot or a medical condition that can cause an increased risk of forming blood clots. Blood clots can cause serious health problems by blocking the flow of blood to the heart, lung, or brain. Coumadin can prevent harmful blood clots from forming. As a reminder your indication for Coumadin is:   Blood Clot Prevention After Heart Valve Surgery  What test will check on my response to Coumadin? While on Coumadin (warfarin) you will need to have an INR test regularly to ensure that your dose is keeping you in the desired range. The INR (international normalized ratio) number is calculated from the result of the laboratory test called prothrombin time (PT).  If an INR APPOINTMENT HAS NOT ALREADY BEEN MADE FOR YOU please schedule an appointment to have this lab work done by your health care provider within 7 days. Your INR goal is usually a number between:  2 to 3 or your provider may give you a more narrow range like 2-2.5.  Ask your health care provider during an office visit what your goal INR is.  What  do you need to  know   About  COUMADIN? Take Coumadin (warfarin) exactly as prescribed by your healthcare provider about the same time each day.  DO NOT stop taking without talking to the doctor who prescribed the medication.  Stopping without other blood clot prevention medication to take the place of Coumadin may increase your risk of developing a new clot or stroke.  Get refills before you run out.  What do you do if you miss a dose? If you miss a dose, take it as soon as you remember on the same day then continue your regularly scheduled regimen the next day.  Do not take two doses of Coumadin at the same time.  Important Safety Information A possible side effect of Coumadin (Warfarin) is an increased risk of bleeding. You should call your healthcare provider right away if you experience any of the following: ? Bleeding from an injury or your nose that does not stop. ? Unusual colored urine (red or dark brown) or unusual colored stools (red or black). ? Unusual bruising for unknown reasons. ? A serious fall or if you hit your head (even if there is no bleeding).  Some foods  or medicines interact with Coumadin (warfarin) and might alter your response to warfarin. To help avoid this: ? Eat a balanced diet, maintaining a consistent amount of Vitamin K. ? Notify your provider about major diet changes you plan to make. ? Avoid alcohol or limit your intake to 1 drink for women and 2 drinks for men per day. (1 drink is 5 oz. wine, 12 oz. beer, or 1.5 oz. liquor.)  Make sure that ANY health care provider who prescribes medication for you knows that you are taking Coumadin (warfarin).  Also make sure the healthcare provider who is monitoring your Coumadin knows when you have started a new medication including herbals and non-prescription products.  Coumadin (Warfarin)  Major Drug Interactions  Increased Warfarin Effect Decreased Warfarin Effect  Alcohol (large quantities) Antibiotics (esp. Septra/Bactrim, Flagyl,  Cipro) Amiodarone (Cordarone) Aspirin (ASA) Cimetidine (Tagamet) Megestrol (Megace) NSAIDs (ibuprofen, naproxen, etc.) Piroxicam (Feldene) Propafenone (Rythmol SR) Propranolol (Inderal) Isoniazid (INH) Posaconazole (Noxafil) Barbiturates (Phenobarbital) Carbamazepine (Tegretol) Chlordiazepoxide (Librium) Cholestyramine (Questran) Griseofulvin Oral Contraceptives Rifampin Sucralfate (Carafate) Vitamin K   Coumadin (Warfarin) Major Herbal Interactions  Increased Warfarin Effect Decreased Warfarin Effect  Garlic Ginseng Ginkgo biloba Coenzyme Q10 Green tea St. Johns wort    Coumadin (Warfarin) FOOD Interactions  Eat a consistent number of servings per week of foods HIGH in Vitamin K (1 serving =  cup)  Collards (cooked, or boiled & drained) Kale (cooked, or boiled & drained) Mustard greens (cooked, or boiled & drained) Parsley *serving size only =  cup Spinach (cooked, or boiled & drained) Swiss chard (cooked, or boiled & drained) Turnip greens (cooked, or boiled & drained)  Eat a consistent number of servings per week of foods MEDIUM-HIGH in Vitamin K (1 serving = 1 cup)  Asparagus (cooked, or boiled & drained) Broccoli (cooked, boiled & drained, or raw & chopped) Brussel sprouts (cooked, or boiled & drained) *serving size only =  cup Lettuce, raw (green leaf, endive, romaine) Spinach, raw Turnip greens, raw & chopped   These websites have more information on Coumadin (warfarin):  http://www.king-russell.com/; https://www.hines.net/;

## 2017-07-12 NOTE — Progress Notes (Addendum)
      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 No complaints this am.  Objective: Vital signs in last 24 hours: Temp:  [98.8 F (37.1 C)-99.1 F (37.3 C)] 98.8 F (37.1 C) (06/24 0423) Pulse Rate:  [102-106] 104 (06/24 0423) Cardiac Rhythm: Sinus tachycardia (06/24 0423) Resp:  [18-20] 19 (06/24 0423) BP: (107-133)/(72-86) 127/83 (06/24 0423) SpO2:  [98 %-100 %] 100 % (06/24 0423) Weight:  [260 lb 9.6 oz (118.2 kg)] 260 lb 9.6 oz (118.2 kg) (06/24 0423)  Pre op weight 122 kg Current Weight  07/12/17 260 lb 9.6 oz (118.2 kg)      Intake/Output from previous day: 06/23 0701 - 06/24 0700 In: 230 [P.O.:220; I.V.:10] Out: 1450 [Urine:1450]   Physical Exam:  Cardiovascular: RRR, no murmur Pulmonary: Diminished right base and clear on the left. Abdomen: Soft, non tender, bowel sounds present. Extremities: Mild bilateral lower extremity edema. Wounds: Clean and dry.  No erythema or signs of infection.  Lab Results: CBC: Recent Labs    07/10/17 0402  WBC 8.0  HGB 9.1*  HCT 29.5*  PLT 121*   BMET:  Recent Labs    07/11/17 0527 07/12/17 0430  NA 137 140  K 3.8 4.0  CL 103 104  CO2 27 29  GLUCOSE 94 100*  BUN 20 19  CREATININE 1.00 1.10  CALCIUM 8.3* 8.6*    PT/INR:  Lab Results  Component Value Date   INR 1.18 07/12/2017   INR 1.11 07/11/2017   INR 1.22 07/10/2017   ABG:  INR: Will add last result for INR, ABG once components are confirmed Will add last 4 CBG results once components are confirmed  Assessment/Plan:  1. CV - ST in the low 100's. Co ox decreased to 54.4. On Coreg 3.125 mg bid, Losartan 12.5 mg daily, Spironolactone 12.5 mg  daily, and Coumadin. Will discuss with Dr. Cornelius Moraswen if should increase Coreg;he is on his dose he was taking PTA.  INR with slight increase from 1.11 to 1.18. Continue with 7.5 mg of Coumadin as will see first result in am. Will order echo as discussed with Dr. Cornelius Moraswen 2.  Pulmonary - On 2 liters of oxygen via [place]-wean to room air as tolerates. Encourage incentive spirometer 3. Volume Overload - On Lasix 40 mg daily. He is below pre op weight but BNP 567 yesterday. 4.  Acute blood loss anemia - Last H and H slightly decreased to 9.1 and 29.5 5. Mild thrombocytopenia-last platelet 121,000 6. Will discuss discharge disposition as will see first Coumadin result at 7.5 mg in the am  Donielle M ZimmermanPA-C 07/12/2017,7:01 AM [phone]  I have seen and examined the patient and agree with the assessment and plan as outlined.  Will not increase carvedilol.  Check f/u ECHO today.  Possible d/c home 1-2 days.  Will need early f/u in advanced heart failure clinic  Purcell Nailslarence H Samin Milke, MD 07/12/2017 10:21 AM

## 2017-07-12 NOTE — Progress Notes (Signed)
CARDIAC REHAB PHASE I   PRE:  Rate/Rhythm: 109 ST  BP:  Sitting: 135/97      SaO2: 98 RA  MODE:  Ambulation: 470 ft 121 peak HR  POST:  Rate/Rhythm: 112 ST  BP:  Sitting: 136/100    SaO2: 96 RA  Pt ambulated 45970ft in hallway independently with EVA. Encouraged pt to walk twice more today and to use walker or rollator. Pt demonstrated 1250 on IS and encouraged to continue use post d/c. Reviewed heart healthy and low sodium diets with pt, pt declining handouts stating he's received them on a previous admission. Reviewed sternal precautions and restrictions, and gave pt exercise guidelines, and encouraged to continue walking. Brochure given, and will refer to CRP II [place]. Will continue to follow.  [phone] Joshua Boweneresa  Delinda Malan, RN BSN 07/12/2017 9:23 AM

## 2017-07-12 NOTE — Progress Notes (Signed)
  Echocardiogram 2D Echocardiogram has been performed.  Joshua May 07/12/2017, 10:18 AM

## 2017-07-12 NOTE — Care Management Note (Signed)
Case Management Note Donn PieriniKristi Alaijah Gibler RN, BSN Unit 4E-Case Manager [phone]   Patient Details  Name: Joshua May MRN: [id number] Date of Birth: 04-26-1975  Subjective/Objective:    Pt admitted s/p MVR                Action/Plan: PTA Pt lived at home with wife- independent. [facility]. Anticipate return home. CM will follow for PCP and potential medication assistance needs.   Expected Discharge Date:  (pending)               Expected Discharge Plan:  Home/Self Care  In-House Referral:  Clinical Social Work  Discharge planning Services  CM Consult  Post Acute Care Choice:    Choice offered to:     DME Arranged:    DME Agency:     HH Arranged:    HH Agency:     Status of Service:  In process, will continue to follow  If discussed at Long Length of Stay Meetings, dates discussed:    Discharge Disposition:   Additional Comments:  Darrold SpanWebster, Zannie Locastro Hall, RN 07/12/2017, 10:32 AM

## 2017-07-13 DIAGNOSIS — I5022 Chronic systolic (congestive) heart failure: Secondary | ICD-10-CM

## 2017-07-13 DIAGNOSIS — Z9889 Other specified postprocedural states: Secondary | ICD-10-CM

## 2017-07-13 LAB — PROTIME-INR
INR: 1.53
Prothrombin Time: 18.2 seconds — ABNORMAL HIGH (ref 11.4–15.2)

## 2017-07-13 MED ORDER — DIGOXIN 125 MCG PO TABS
0.1250 mg | ORAL_TABLET | Freq: Every day | ORAL | Status: DC
  Administered 2017-07-13: 0.125 mg via ORAL
  Filled 2017-07-13: qty 1

## 2017-07-13 MED ORDER — FUROSEMIDE 40 MG PO TABS
40.0000 mg | ORAL_TABLET | Freq: Two times a day (BID) | ORAL | Status: DC
  Administered 2017-07-13 – 2017-07-14 (×2): 40 mg via ORAL
  Filled 2017-07-13 (×2): qty 1

## 2017-07-13 MED ORDER — WARFARIN SODIUM 5 MG PO TABS
5.0000 mg | ORAL_TABLET | Freq: Every day | ORAL | Status: DC
  Administered 2017-07-13: 5 mg via ORAL
  Filled 2017-07-13: qty 1

## 2017-07-13 MED ORDER — ASPIRIN 81 MG PO TBEC: 81.0000 mg | DELAYED_RELEASE_TABLET | Freq: Every day | ORAL | Status: DC

## 2017-07-13 MED ORDER — SPIRONOLACTONE 25 MG PO TABS
25.0000 mg | ORAL_TABLET | Freq: Every day | ORAL | Status: DC
  Administered 2017-07-14: 25 mg via ORAL
  Filled 2017-07-13: qty 1

## 2017-07-13 MED ORDER — HYDROCODONE-ACETAMINOPHEN 10-325 MG PO TABS: ORAL_TABLET | ORAL | 0 refills | Status: DC

## 2017-07-13 MED ORDER — DIGOXIN 125 MCG PO TABS: 0.1250 mg | ORAL_TABLET | Freq: Every day | ORAL | 1 refills | Status: DC

## 2017-07-13 MED ORDER — WARFARIN SODIUM 5 MG PO TABS: 5.0000 mg | ORAL_TABLET | Freq: Every day | ORAL | 1 refills | Status: DC

## 2017-07-13 MED ORDER — FOLIC ACID 1 MG PO TABS: 1.0000 mg | ORAL_TABLET | Freq: Every day | ORAL | Status: DC

## 2017-07-13 MED ORDER — SPIRONOLACTONE 25 MG PO TABS: 25.0000 mg | ORAL_TABLET | Freq: Every day | ORAL | 1 refills | Status: DC

## 2017-07-13 MED ORDER — HYDROCODONE-ACETAMINOPHEN 7.5-325 MG/15ML PO SOLN: 10.0000 mL | ORAL | Status: DC | PRN

## 2017-07-13 MED ORDER — FUROSEMIDE 10 MG/ML IJ SOLN: 40.0000 mg | Freq: Once | INTRAMUSCULAR | Status: DC

## 2017-07-13 MED ORDER — HYDROCODONE-ACETAMINOPHEN 10-325 MG PO TABS
1.0000 | ORAL_TABLET | ORAL | Status: DC | PRN
  Administered 2017-07-13 – 2017-07-14 (×6): 1 via ORAL
  Filled 2017-07-13 (×6): qty 1

## 2017-07-13 MED ORDER — FERROUS SULFATE 325 (65 FE) MG PO TABS: 325.0000 mg | ORAL_TABLET | Freq: Every day | ORAL | 3 refills | Status: DC

## 2017-07-13 MED FILL — Sodium Chloride IV Soln 0.9%: INTRAVENOUS | Qty: 2000 | Status: AC

## 2017-07-13 MED FILL — Calcium Chloride Inj 10%: INTRAVENOUS | Qty: 10 | Status: AC

## 2017-07-13 MED FILL — Lidocaine HCl Local Soln Prefilled Syringe 100 MG/5ML (2%): INTRAMUSCULAR | Qty: 25 | Status: AC

## 2017-07-13 MED FILL — Heparin Sodium (Porcine) Inj 1000 Unit/ML: INTRAMUSCULAR | Qty: 20 | Status: AC

## 2017-07-13 MED FILL — Electrolyte-R (PH 7.4) Solution: INTRAVENOUS | Qty: 4000 | Status: AC

## 2017-07-13 MED FILL — Mannitol IV Soln 20%: INTRAVENOUS | Qty: 1000 | Status: AC

## 2017-07-13 MED FILL — Sodium Bicarbonate IV Soln 8.4%: INTRAVENOUS | Qty: 50 | Status: AC

## 2017-07-13 NOTE — Consult Note (Addendum)
Advanced Heart Failure Team Consult Note   Primary Physician: Patient, No Pcp Per PCP-Cardiologist:  Rollene Rotunda, MD  HF MD:  Dr Shirlee Latch   Reason for Consultation: Heart Failure   HPI:    [facility] May is seen today for evaluation of heart failure at the request of Dr Cornelius Joshua May.  [name] is a  42 year old with history of CAD, STEMI 2010 DES RCA, CFX infarct 2017 total mCFX and 85% OM3. He had CFX PCI with DES and OM3 POBA, PE 2019, MR, smoker, and chronic systolic heart failure.    Admitted 02/27/17 with increased dyspnea and chest pain. CTA confirmed PE. ECHO completed and showed reduced EF 20-25%. Underwent LHC as noted below. [place] no evidence DVTs. CT surgery consulted for severe MR.   He has been followed closely in the HF for HF optimization prior mitral valve repair.   Admitted 07/07/17 for scheduled mitral valve repair. On 6/19 he underwent minimally invasive mitral valve repair. ECHO completed on 6/24 with EF 25-30% RV mildly reduced and no MR. Post operatively he has been tachycardic 110s and up to 120 with ambulation. CO-OX 54% today  Today he is feeling fair. Having pain. Coughing at night. Mild dyspnea with exertion.     Echo 07/12/2017 EF 25-30% RV mildly reduced.   ECHO 03/01/2017  Mildly dilated LV with EF 25-30%, diffuse hypokinesis with inferolateral akinesis. Restrictive diastolic function. RV poorly visualized but appears normal in size with mildly decreased systolic function. D-shaped interventricular septum is suggestive of RV pressure/volume overload. Moderate pulmonary hypertension. Moderate to severe mitral regurgitation, suspect infarct-related MR with restricted posterior leaflet and akinetic inferolateral wall.  LHC 03/03/2017   Previously placed Prox Cx to Mid Cx stent (unknown type) is widely patent.  Prox Cx lesion is 45% stenosed. - proximal to the Stent -- FFR 0.88.  Prox RCA to Mid RCA stent is 55% stenosed.  Dist LAD lesion is 20%  stenosed.  LV end diastolic pressure is severely elevated. 30-31 mmHg    Review of Systems: [y] = yes, [ ]  = no   General: Weight gain [ ] ; Weight loss [ ] ; Anorexia [ ] ; Fatigue [ Y]; Fever [ ] ; Chills [ ] ; Weakness [Y ]  Cardiac: Chest pain/pressure [ ] ; Resting SOB [ ] ; Exertional SOB [ Y]; Orthopnea [Y ]; Pedal Edema [ ] ; Palpitations [ ] ; Syncope [ ] ; Presyncope [ ] ; Paroxysmal nocturnal dyspnea[ ]   Pulmonary: Cough [ ] ; Wheezing[ ] ; Hemoptysis[ ] ; Sputum [ ] ; Snoring [ ]   GI: Vomiting[ ] ; Dysphagia[ ] ; Melena[ ] ; Hematochezia [ ] ; Heartburn[ ] ; Abdominal pain [ ] ; Constipation [ ] ; Diarrhea [ ] ; BRBPR [ ]   GU: Hematuria[ ] ; Dysuria [ ] ; Nocturia[ ]   Vascular: Pain in legs with walking [ ] ; Pain in feet with lying flat [ ] ; Non-healing sores [ ] ; Stroke [ ] ; TIA [ ] ; Slurred speech [ ] ;  Neuro: Headaches[ ] ; Vertigo[ ] ; Seizures[ ] ; Paresthesias[ ] ;Blurred vision [ ] ; Diplopia [ ] ; Vision changes [ ]   Ortho/Skin: Arthritis [ ] ; Joint pain [ ] ; Muscle pain [ ] ; Joint swelling [ ] ; Back Pain [ ] ; Rash [ ]   Psych: Depression[ ] ; Anxiety[Y ]  Heme: Bleeding problems [ ] ; Clotting disorders [ ] ; Anemia [ ]   Endocrine: Diabetes [ ] ; Thyroid dysfunction[ ]   Home Medications Prior to Admission medications   Medication Sig Start Date End Date Taking? Authorizing Provider  atorvastatin (LIPITOR) 80 MG tablet Take 1 tablet (80  mg total) by mouth daily. 05/05/17  Yes Graciella Freer, PA-C  carvedilol (COREG) 3.125 MG tablet Take 1 tablet (3.125 mg total) by mouth 2 (two) times daily with a meal. 05/05/17  Yes Tillery, Mariam Dollar, PA-C  furosemide (LASIX) 20 MG tablet Take 1 tablet (20 mg total) by mouth daily. 05/19/17  Yes Graciella Freer, PA-C  losartan (COZAAR) 25 MG tablet Take 0.5 tablets (12.5 mg total) by mouth daily. Patient taking differently: Take 12.5 mg by mouth every evening.  05/05/17  Yes Graciella Freer, PA-C  nitroGLYCERIN (NITROSTAT) 0.4 MG SL tablet  Place 1 tablet (0.4 mg total) under the tongue every 5 (five) minutes as needed for chest pain. 10/22/15  Yes Little Ishikawa, NP  spironolactone (ALDACTONE) 25 MG tablet Take 0.5 tablets (12.5 mg total) by mouth daily. 05/05/17  Yes Graciella Freer, PA-C  aspirin EC 81 MG EC tablet Take 1 tablet (81 mg total) by mouth daily. 07/14/17   Ardelle Balls, PA-C  ferrous sulfate 325 (65 FE) MG tablet Take 1 tablet (325 mg total) by mouth daily with breakfast. For one month then stop. 07/13/17 07/13/18  Ardelle Balls, PA-C  folic acid (FOLVITE) 1 MG tablet Take 1 tablet (1 mg total) by mouth daily. For one month then stop. 07/13/17   Ardelle Balls, PA-C  Meclizine HCl 25 MG CHEW Chew 1 tablet (25 mg total) by mouth daily as needed. Patient taking differently: Chew 25 mg by mouth daily as needed (for dizziness/vertigo).  06/16/17   Graciella Freer, PA-C  rivaroxaban (XARELTO) 20 MG TABS tablet Take 1 tablet (20 mg total) by mouth at bedtime. 05/05/17   Graciella Freer, PA-C  warfarin (COUMADIN) 5 MG tablet Take 1 tablet (5 mg total) by mouth daily at 6 PM. 07/13/17   Ardelle Balls, PA-C    Past Medical History: Past Medical History:  Diagnosis Date  . Asthma   . CAD (coronary artery disease)   . Chronic systolic CHF (congestive heart failure) (HCC)   . HLD (hyperlipidemia)    Qualifier: Diagnosis of  By: Antoine Poche, MD, Gerrit Heck    . Hypercholesteremia   . Inferior myocardial infarction [facility]) 2007   PCI of RCA  . Inferior myocardial infarction (HCC) 03/31/2008   PCI of RCA with bare metal stent  . Ischemic cardiomyopathy   . Migraines    and dizziness  . Mitral regurgitation   . Obstructive sleep apnea    Qualifier: Diagnosis of  By: Antoine Poche, MD, Gerrit Heck    . 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 Pulmonary embolism (HCC) 02/28/2017  . 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   Carpentier-McCarthy  Adams ring annuloplasty,   size 26    . Tobacco abuse     Past Surgical History: Past Surgical History:  Procedure Laterality Date  . CARDIAC CATHETERIZATION N/A 10/20/2015   Procedure: Left Heart Cath and Coronary Angiography;  Surgeon: Kathleene Hazel, MD;  Location: [facility] INVASIVE CV LAB;  Service: Cardiovascular;  Laterality: N/A;  . CARDIAC CATHETERIZATION N/A 10/20/2015   Procedure: Coronary Stent Intervention;  Surgeon: Kathleene Hazel, MD;  Location: [facility] INVASIVE CV LAB;  Service: Cardiovascular;  Laterality: N/A;  . INTRAVASCULAR PRESSURE WIRE/FFR STUDY N/A 03/03/2017   Procedure: INTRAVASCULAR PRESSURE WIRE/FFR STUDY;  Surgeon: Marykay Lex, MD;  Location: [facility] INVASIVE CV LAB;  Service: Cardiovascular;  Laterality: N/A;  . LEFT HEART CATH AND  CORONARY ANGIOGRAPHY N/A 03/03/2017   Procedure: LEFT HEART CATH AND CORONARY ANGIOGRAPHY;  Surgeon: Marykay Lex, MD;  Location: [facility] INVASIVE CV LAB;  Service: Cardiovascular;  Laterality: N/A;  . MITRAL VALVE REPAIR Right 07/07/2017   Procedure: MINIMALLY INVASIVE MITRAL VALVE REPAIR (MVR) using Carpentier-McCarthy Adams Ring size 26;  Surgeon: Purcell Nails, MD;  Location: [facility] OR;  Service: Open Heart Surgery;  Laterality: Right;  . MULTIPLE EXTRACTIONS WITH ALVEOLOPLASTY N/A 05/13/2017   Procedure: Extraction of tooth #'s 2,15,16,17 with alveoloplasty and gross debridement of remaining teeth.;  Surgeon: Charlynne Pander, DDS;  Location: [facility] OR;  Service: Oral Surgery;  Laterality: N/A;  . none    . OTHER SURGICAL HISTORY     NONE  . PATENT FORAMEN OVALE(PFO) CLOSURE N/A 07/07/2017   Procedure: PATENT FORAMEN OVALE (PFO) CLOSURE;  Surgeon: Purcell Nails, MD;  Location: [facility] OR;  Service: Open Heart Surgery;  Laterality: N/A;  . TEE WITHOUT CARDIOVERSION N/A 04/19/2017   Procedure: TRANSESOPHAGEAL ECHOCARDIOGRAM (TEE);  Surgeon: Laurey Morale, MD;  Location: [facility] ENDOSCOPY;  Service: Cardiovascular;  Laterality: N/A;  . TEE  WITHOUT CARDIOVERSION N/A 07/07/2017   Procedure: TRANSESOPHAGEAL ECHOCARDIOGRAM (TEE);  Surgeon: Purcell Nails, MD;  Location: [facility] OR;  Service: Open Heart Surgery;  Laterality: N/A;    Family History: Family History  Problem Relation Age of Onset  . Other Sister 60       THE PATIENT REPORTS A SISTER WITH A MYOCARDIAL INFARCTION IN HER 30s.(SHE IS NOW S/P CABG)  . Heart attack Father   . Deep vein thrombosis Father     Social History: Social History   Socioeconomic History  . Marital status: Married    Spouse name: Not on file  . Number of children: Not on file  . Years of education: Not on file  . Highest education level: Not on file  Occupational History  . Not on file  Social Needs  . Financial resource strain: Not on file  . Food insecurity:    Worry: Not on file    Inability: Not on file  . Transportation needs:    Medical: Not on file    Non-medical: Not on file  Tobacco Use  . Smoking status: Former Smoker    Packs/day: 0.50    Years: 17.00    Pack years: 8.50    Types: Cigarettes  . Smokeless tobacco: Never Used  . Tobacco comment: quit since heart attack in 10/2015  Substance and Sexual Activity  . Alcohol use: Not Currently  . Drug use: Not Currently    Types: Marijuana  . Sexual activity: Not on file  Lifestyle  . Physical activity:    Days per week: Not on file    Minutes per session: Not on file  . Stress: Not on file  Relationships  . Social connections:    Talks on phone: Not on file    Gets together: Not on file    Attends religious service: Not on file    Active member of club or organization: Not on file    Attends meetings of clubs or organizations: Not on file    Relationship status: Not on file  Other Topics Concern  . Not on file  Social History Narrative   The patient is married with two children.    Allergies:  Allergies  Allergen Reactions  . Shellfish Allergy Anaphylaxis    Objective:    Vital Signs:   Temp:  [97.6  F (36.4  C)-99.4 F (37.4 C)] 98.8 F (37.1 C) (06/25 0810) Pulse Rate:  [88-117] 117 (06/25 0838) Resp:  [18-25] 21 (06/25 0810) BP: (122-140)/(63-92) 124/92 (06/25 0810) SpO2:  [97 %-100 %] 97 % (06/25 0408) Weight:  [259 lb 4.8 oz (117.6 kg)] 259 lb 4.8 oz (117.6 kg) (06/25 0629) Last BM Date: 07/11/17  Weight change: Filed Weights   07/11/17 0515 07/12/17 0423 07/13/17 0629  Weight: 266 lb 4.8 oz (120.8 kg) 260 lb 9.6 oz (118.2 kg) 259 lb 4.8 oz (117.6 kg)    Intake/Output:   Intake/Output Summary (Last 24 hours) at 07/13/2017 1128 Last data filed at 07/13/2017 0629 Gross per 24 hour  Intake 480 ml  Output 1100 ml  Net -620 ml      Physical Exam    General:  Sitting in the chair.  No resp difficulty HEENT: normal Neck: supple. JVP ~10. Carotids 2+ bilat; no bruits. No lymphadenopathy or thyromegaly appreciated. Cor: PMI nondisplaced. Tach Regular rate & rhythm. No rubs, gallops or murmurs. Lungs: clear Abdomen: soft, nontender, nondistended. No hepatosplenomegaly. No bruits or masses. Good bowel sounds. Extremities: no cyanosis, clubbing, rash, edema. RUE PICC  Neuro: alert & orientedx3, cranial nerves grossly intact. moves all 4 extremities w/o difficulty. Affect pleasant   Telemetry  Sinsu Tach 110s   EKG    n.a  Labs   Basic Metabolic Panel: Recent Labs  Lab 07/07/17 2137  07/08/17 0348 07/08/17 1653 07/08/17 1700 07/09/17 0452 07/10/17 0402 07/11/17 0527 07/12/17 0430  NA  --    < > 138 137  --  135 138 137 140  K  --    < > 4.3 4.4  --  4.5 3.5 3.8 4.0  CL  --    < > 108 99*  --  102 103 103 104  CO2  --   --  25  --   --  28 29 27 29   GLUCOSE  --    < > 129* 132*  --  118* 101* 94 100*  BUN  --    < > 11 15  --  15 24* 20 19  CREATININE 0.99   < > 1.04 1.10 1.15 1.07 1.14 1.00 1.10  CALCIUM  --    < > 7.8*  --   --  8.2* 8.1* 8.3* 8.6*  MG 2.5*  --  2.0  --  2.2  --   --   --   --    < > = values in this interval not displayed.    Liver  Function Tests: No results for input(s): AST, ALT, ALKPHOS, BILITOT, PROT, ALBUMIN in the last 168 hours. No results for input(s): LIPASE, AMYLASE in the last 168 hours. No results for input(s): AMMONIA in the last 168 hours.  CBC: Recent Labs  Lab 07/07/17 2137  07/08/17 0348 07/08/17 1653 07/08/17 1700 07/09/17 0452 07/10/17 0402  WBC 12.7*  --  12.7*  --  16.0* 17.8* 8.0  HGB 10.4*   < > 10.2* 10.5* 10.2* 9.9* 9.1*  HCT 32.4*   < > 32.5* 31.0* 32.6* 32.3* 29.5*  MCV 93.4  --  95.9  --  96.4 96.7 96.1  PLT 126*  --  133*  --  130* 122* 121*   < > = values in this interval not displayed.    Cardiac Enzymes: No results for input(s): CKTOTAL, CKMB, CKMBINDEX, TROPONINI in the last 168 hours.  BNP: BNP (last 3 results) Recent Labs    05/05/17  1610 06/16/17 1159 07/11/17 0527  BNP 40.3 39.2 567.1*    ProBNP (last 3 results) No results for input(s): PROBNP in the last 8760 hours.   CBG: Recent Labs  Lab 07/09/17 1245 07/09/17 1535 07/09/17 2207 07/10/17 0655 07/10/17 1136  GLUCAP 101* 104* 89 122* 91    Coagulation Studies: Recent Labs    07/11/17 0527 07/12/17 0430 07/13/17 0420  LABPROT 14.2 14.9 18.2*  INR 1.11 1.18 1.53     Imaging    No results found.   Medications:     Current Medications: . aspirin EC  81 mg Oral Daily  . atorvastatin  80 mg Oral Daily  . carvedilol  3.125 mg Oral BID WC  . Chlorhexidine Gluconate Cloth  6 each Topical Daily  . digoxin  0.125 mg Oral Daily  . enoxaparin (LOVENOX) injection  40 mg Subcutaneous QHS  . folic acid-pyridoxine-cyancobalamin  1 tablet Oral Daily  . furosemide  40 mg Oral Daily  . losartan  12.5 mg Oral Daily  . mouth rinse  15 mL Mouth Rinse BID  . moving right along book   Does not apply Once  . pantoprazole  40 mg Oral Daily  . potassium chloride  20 mEq Oral Daily  . sodium chloride flush  10-40 mL Intracatheter Q12H  . sodium chloride flush  10-40 mL Intracatheter Q12H  . sodium  chloride flush  3 mL Intravenous Q12H  . spironolactone  12.5 mg Oral Daily  . warfarin  5 mg Oral q1800  . Warfarin - Physician Dosing Inpatient   Does not apply q1800     Infusions: . sodium chloride 10 mL/hr at 07/10/17 0400  . lactated ringers    . lactated ringers Stopped (07/09/17 9604)       Patient Profile   Mr Costlow is a  42 year old with history of CAD, STEMI 2010 DES RCA, CFX infarct 2017 total mCFX and 85% OM3. He had CFX PCI with DES and OM3 POBA, PE 2019, MR, smoker, and chronic systolic heart failure.   Admitted for scheduled mitral valve surgery.     Assessment/Plan   1. Severe MR- S/P Minimally invasive mitral valve repair on 6/19  ECHO  6/24 Post surgery. No MR  Post op care per CT surgery .   2. A/C Systolic Heart Failure  ECHO 07/12/2017 EF 25-30%.  Volume overloaded. Give 40 mg IV lasix x1 . Will need to be careful with HF medication titration with hypotension noted in the outpatient setting.    CO-OX 54%. On low dose carvedilol 3.125 mg twice a day.  - Add digoxin 0.125 mg daily -Stop po lasix.  - Increase spiro to 25 mg daily.   - Continue losartan 12.5 mg daily.  -Renal function stable.   3. CAD  Stents in LCX and RCA. LHC cath 02/2017 nonobstructive disease.  -No s/s ischemia - Continue aspirin and statin.   4. H/O PE  CTA 02/2017  PE   Etiology. Father has venous atheroembolism.  - + lupus anticoagulant but with xarelto false positive so no need for hematology consult. .  - Continue lifelong Xarelto.    Hold d/c another 24 hours.   Medication concerns reviewed with patient and pharmacy team. Barriers identified: no insurance.   Length of Stay: 6  Amy Clegg, NP  07/13/2017, 11:28 AM  Advanced Heart Failure Team Pager [phone] (M-F; 7a - 4p)  Please contact [facility] for night-coverage after hours (4p -7a ) and weekends  on [email or web address]  Patient seen and examined with Tonye BecketAmy Clegg, NP. We discussed all aspects of the encounter. I  agree with the assessment and plan as stated above.   42 y/o male  With h/o CAD and severe ischemic mitral regurgitation with associated severe LV dysfunction with LV EF 20-25% and moderate RV dysfunction. Now POD #6 mini-MVR. Feeling better but still weak. Remains quite tachycardic with HR 110-120. Recent co-ox marginal in low 50s. Post-op echo reviewed shows excellent MV repair with severe LV dysfunction EF 20-25% and moderate RV dysfunction .  On exam he is NAD. JVP looks ok. Cor tachy regular. ? Soft s3 No MR. Lungs CTA. Ab obese NT Ext no edema.   He is tenuous post-op but improving. Tachycardia likely reflects compromised SV. Agree with adding digoxin. Continue spiro and losartan. No b-blocker yet. Would watch one more day and possibly home tomorrow with close outpatient f/u in HF Clinic. D/w Dr. Cornelius Moraswen.   Arvilla Meresaniel Bensimhon, MD  6:46 PM

## 2017-07-13 NOTE — Progress Notes (Addendum)
      [address]       [place] [id number]             [phone]        6 Days Post-Op Procedure(s) (LRB): MINIMALLY INVASIVE MITRAL VALVE REPAIR (MVR) using Carpentier-McCarthy Adams Ring size 26 (Right) TRANSESOPHAGEAL ECHOCARDIOGRAM (TEE) (N/A) PATENT FORAMEN OVALE (PFO) CLOSURE (N/A)  Subjective: Patient states has cough with sputum (clear) production. Also, Ultram is not helping with pain and he is requesting Vicodin.  Objective: Vital signs in last 24 hours: Temp:  [97.6 F (36.4 C)-99.4 F (37.4 C)] 99.2 F (37.3 C) (06/25 0408) Pulse Rate:  [88-113] 88 (06/25 0030) Cardiac Rhythm: Sinus tachycardia (06/24 1941) Resp:  [18-25] 19 (06/25 0629) BP: (122-140)/(63-97) 129/67 (06/25 0408) SpO2:  [97 %-100 %] 97 % (06/25 0408) Weight:  [259 lb 4.8 oz (117.6 kg)] 259 lb 4.8 oz (117.6 kg) (06/25 0629)  Pre op weight 122 kg Current Weight  07/13/17 259 lb 4.8 oz (117.6 kg)      Intake/Output from previous day: 06/24 0701 - 06/25 0700 In: 500 [P.O.:480; I.V.:20] Out: 1100 [Urine:1100]   Physical Exam:  Cardiovascular: RRR, no murmur Pulmonary: Slightly diminished right base and clear on the left. Abdomen: Soft, non tender, bowel sounds present. Extremities: Trace bilateral lower extremity edema. Wounds: Clean and dry.  No erythema or signs of infection.  Lab Results: CBC: No results for input(s): WBC, HGB, HCT, PLT in the last 72 hours. BMET:  Recent Labs    07/11/17 0527 07/12/17 0430  NA 137 140  K 3.8 4.0  CL 103 104  CO2 27 29  GLUCOSE 94 100*  BUN 20 19  CREATININE 1.00 1.10  CALCIUM 8.3* 8.6*    PT/INR:  Lab Results  Component Value Date   INR 1.53 07/13/2017   INR 1.18 07/12/2017   INR 1.11 07/11/2017   ABG:  INR: Will add last result for INR, ABG once components are confirmed Will add last 4 CBG results once components are confirmed  Assessment/Plan:  1. CV - ST in the 110's.  On Coreg 3.125 mg bid, Losartan  12.5 mg daily, Spironolactone 12.5 mg daily, and Coumadin.  INR with  increase from 1.18 to 1.53. Will decrease Coumadin (to 5 mg as discussed with Dr. Cornelius Moraswen) as large increase with 7.5 mg. Echo showed LVEF 25-30%, no significant MR, and mild TR. Heart failure will evaluate medications, as is an established patient. 2.  Pulmonary - On room air. Encourage incentive spirometer 3. Volume Overload - On Lasix 40 mg daily. He is below pre op weight but BNP 567 Sunday. 4.  Acute blood loss anemia - Last H and H slightly decreased to 9.1 and 29.5 5. Mild thrombocytopenia-last platelet 121,000 6. Stop Tramadol and start Vicodin, at patient request 7. Probable discharge later today, after medication recommendations made by heart failure.  Donielle M ZimmermanPA-C 07/13/2017,7:07 AM [phone]  I have seen and examined the patient and agree with the assessment and plan as outlined.  Purcell Nailslarence H Kenith Trickel, MD 07/13/2017 2:46 PM

## 2017-07-13 NOTE — Progress Notes (Signed)
CARDIAC REHAB PHASE I   PRE:  Rate/Rhythm: 117 ST  BP:  Sitting: 124/70      SaO2: 95 RA  MODE:  Ambulation: 470 ft 123 peak HR  POST:  Rate/Rhythm: 120 ST  BP:  Sitting: 148/100    SaO2: 97 RA   Pt ambulated 44170ft in hallway with front wheel walker and standing rest x1. Pts gowns changed d/t sweating. Pt states he got in his 3 walks yesterday. Encouraged pt to continue walking today. Reviewed importance of coughing and IS use. No questions on yesterdays education. Pt in recliner, call bell and phone within reach. Will continue to follow.  [phone] Reynold Boweneresa  Clarie Camey, RN BSN 07/13/2017 10:17 AM

## 2017-07-13 NOTE — Progress Notes (Signed)
As I discussed with patient, Norco is substituted for Vicodin at the hospital. Patient thinks that may have caused him to "break out in a sweat". He denied nausea. Unsure if if is the Hydrocodone making him sweat or not. He does not want Ultram, Oxy, or Percocet to go home with. He states he will try Norco but if he has further problems after taking it, he will call office. I did inform him a narcotic prescription is unable to be called into pharmacy and someone would have to pick up a prescription at the office.

## 2017-07-14 LAB — BASIC METABOLIC PANEL
Anion gap: 9 (ref 5–15)
BUN: 19 mg/dL (ref 6–20)
CHLORIDE: 98 mmol/L (ref 98–111)
CO2: 28 mmol/L (ref 22–32)
Calcium: 8.7 mg/dL — ABNORMAL LOW (ref 8.9–10.3)
Creatinine, Ser: 1.05 mg/dL (ref 0.61–1.24)
GFR calc non Af Amer: >60 mL/min (ref >60)
Glucose, Bld: 120 mg/dL — ABNORMAL HIGH (ref 70–99)
POTASSIUM: 4.2 mmol/L (ref 3.5–5.1)
Sodium: 135 mmol/L (ref 135–145)

## 2017-07-14 LAB — PROTIME-INR
INR: 1.36
Prothrombin Time: 16.6 seconds — ABNORMAL HIGH (ref 11.4–15.2)

## 2017-07-14 MED ORDER — FUROSEMIDE 40 MG PO TABS: 40.0000 mg | ORAL_TABLET | Freq: Two times a day (BID) | ORAL | 1 refills | Status: DC

## 2017-07-14 MED ORDER — SPIRONOLACTONE 25 MG PO TABS: 25.0000 mg | ORAL_TABLET | Freq: Every day | ORAL | 1 refills | Status: DC

## 2017-07-14 MED ORDER — DIGOXIN 250 MCG PO TABS: 0.2500 mg | ORAL_TABLET | Freq: Every day | ORAL | 1 refills | Status: DC

## 2017-07-14 MED ORDER — DIGOXIN 125 MCG PO TABS
0.2500 mg | ORAL_TABLET | Freq: Every day | ORAL | Status: DC
  Administered 2017-07-14: 0.25 mg via ORAL
  Filled 2017-07-14: qty 2

## 2017-07-14 MED ORDER — WARFARIN SODIUM 7.5 MG PO TABS: 7.5000 mg | ORAL_TABLET | Freq: Every day | ORAL | Status: DC

## 2017-07-14 MED ORDER — WARFARIN SODIUM 7.5 MG PO TABS: 7.5000 mg | ORAL_TABLET | Freq: Every day | ORAL | 1 refills | Status: DC

## 2017-07-14 MED FILL — SPIRONOLACTONE 25 MG TABLET: 25 | 34 days supply | Qty: 34 | Fill #0

## 2017-07-14 MED FILL — DIGOXIN 250 MCG TAB: 250 | 34 days supply | Qty: 34 | Fill #0

## 2017-07-14 MED FILL — FUROSEMIDE 40 MG TAB: 40 | 50 days supply | Qty: 100 | Fill #0

## 2017-07-14 NOTE — Care Management Note (Signed)
Case Management Note Donn PieriniKristi Tiwatope Emmitt RN, BSN Unit 4E-Case Manager [phone]   Patient Details  Name: Joshua May MRN: [id number] Date of Birth: [birth year]  Subjective/Objective:    Pt admitted s/p MVR                Action/Plan: PTA Pt lived at home with wife- independent. [facility] and SS questions. Anticipate return home. CM will follow for PCP and potential medication assistance needs.   Expected Discharge Date:  07/14/17               Expected Discharge Plan:  Home/Self Care  In-House Referral:  Clinical Social Work  Discharge planning Services  CM Consult  Post Acute Care Choice:  Durable Medical Equipment Choice offered to:  Patient, Spouse  DME Arranged:  Dan HumphreysWalker rolling DME Agency:  [facility]  HH Arranged:  NA HH Agency:  NA  Status of Service:  Completed, signed off  If discussed at Long Length of Stay Meetings, dates discussed:    Discharge Disposition: home/self care   Additional Comments:  07/14/17- 1030- Joshua Cremer RN, CM- pt for discharge home today- per cardiac rehab will need walker for home- (pt would like rollator however under charity only RW can be provided)- order has been placed- spoke with pt and wife at bedside to explain walker that can be provided under charity. Reviewed pt's discharge medications- no assistance needed under [facility]. Pt to d/c home with wife and has f/u with HF clinic.   Joshua May, Joshua Stainback Hall, RN 07/14/2017, 10:31 AM

## 2017-07-14 NOTE — Progress Notes (Addendum)
      [address]       [place] [id number]             [phone]        7 Days Post-Op Procedure(s) (LRB): MINIMALLY INVASIVE MITRAL VALVE REPAIR (MVR) using Carpentier-McCarthy Adams Ring size 26 (Right) TRANSESOPHAGEAL ECHOCARDIOGRAM (TEE) (N/A) PATENT FORAMEN OVALE (PFO) CLOSURE (N/A)  Subjective: Patient just waking up this am. He hopes to go home.  Objective: Vital signs in last 24 hours: Temp:  [98.8 F (37.1 C)-100.1 F (37.8 C)] 99.3 F (37.4 C) (06/26 0433) Pulse Rate:  [114-117] 115 (06/26 0008) Cardiac Rhythm: Sinus tachycardia (06/26 0700) Resp:  [16-24] 16 (06/26 0433) BP: (107-142)/(58-96) 127/79 (06/26 0433) SpO2:  [95 %-98 %] 98 % (06/26 0433) Weight:  [257 lb (116.6 kg)] 257 lb (116.6 kg) (06/26 0500)  Pre op weight 122 kg Current Weight  07/14/17 257 lb (116.6 kg)      Intake/Output from previous day: 06/25 0701 - 06/26 0700 In: 685 [P.O.:685] Out: 850 [Urine:850]   Physical Exam:  Cardiovascular: RRR, no murmur Pulmonary: Slightly diminished right base and clear on the left. Abdomen: Soft, non tender, bowel sounds present. Extremities: Trace bilateral lower extremity edema. Wounds: Clean and dry.  No erythema or signs of infection.  Lab Results: CBC: No results for input(s): WBC, HGB, HCT, PLT in the last 72 hours. BMET:  Recent Labs    07/12/17 0430  NA 140  K 4.0  CL 104  CO2 29  GLUCOSE 100*  BUN 19  CREATININE 1.10  CALCIUM 8.6*    PT/INR:  Lab Results  Component Value Date   INR 1.36 07/14/2017   INR 1.53 07/13/2017   INR 1.18 07/12/2017   ABG:  INR: Will add last result for INR, ABG once components are confirmed Will add last 4 CBG results once components are confirmed  Assessment/Plan:  1. CV - Remains tachcardic in the 110's+.  On Coreg 3.125 mg bid, Losartan 12.5 mg daily, Digoxin 0.125 mg daily, Spironolactone 25 mg daily, and Coumadin.  INR with  decreased from 1.53 to 1.36. Will  increase Coumadin (to 7.5 mg ) as INR decreased with second dose of 7.5 mg . Await heart failure's medication recommendations, as is an established patient. 2.  Pulmonary - On room air. Encourage incentive spirometer 3. Volume Overload - Previously on Lasix-per heart failure. He is below pre op weight but BNP 567 Sunday. 4.  Acute blood loss anemia - Last H and H slightly decreased to 9.1 and 29.5 5. Mild thrombocytopenia-last platelet 121,000 6. Possible discharge later today  Joshua May M ZimmermanPA-C 07/14/2017,7:11 AM [phone]

## 2017-07-14 NOTE — Progress Notes (Signed)
Telemetry discontinued. CCMD notified. Discharge instructions reviewed with patient and patient's wife. Walker sent home with patient. Patient verbalized understanding and all questions were answered. Written prescriptions sent home with patient.  Ernestina ColumbiaK. Starr Argentina Kosch, RN

## 2017-07-14 NOTE — Progress Notes (Signed)
CARDIAC REHAB PHASE I   Offered to walk with pt x3. Pt deferring as he is now preparing for d/c. Reviewed importance of IS, sternal precautions, exercise guidelines, and importance of a daily routine with pt and wife. No further questions at this time. Pt waiting on walker for home use.   [phone] Reynold Boweneresa  Maddex Garlitz, RN BSN 07/14/2017 10:45 AM

## 2017-07-14 NOTE — Progress Notes (Addendum)
Advanced Heart Failure Rounding Note  PCP-Cardiologist: Rollene RotundaJames Hochrein, MD   Subjective:    PO lasix increased to BID yesterday. Sluggish UOP, but weight down 2 lbs.   Digoxin added and spiro increased yesterday. BMET pending.  Remains sinus tach 110s.   Denies CP or dizziness. He still has mild SOB with exertion. Mild fatigue. No orthopnea or PND.   Objective:   Weight Range: 257 lb (116.6 kg) Body mass index is 35.84 kg/m.   Vital Signs:   Temp:  [98.8 F (37.1 C)-100.1 F (37.8 C)] 98.8 F (37.1 C) (06/26 0720) Pulse Rate:  [114-117] 116 (06/26 0720) Resp:  [16-24] 19 (06/26 0720) BP: (107-142)/(58-96) 116/64 (06/26 0720) SpO2:  [95 %-98 %] 98 % (06/26 0433) Weight:  [257 lb (116.6 kg)] 257 lb (116.6 kg) (06/26 0500) Last BM Date: 07/11/17  Weight change: Filed Weights   07/13/17 0629 07/14/17 0433 07/14/17 0500  Weight: 259 lb 4.8 oz (117.6 kg) 257 lb (116.6 kg) 257 lb (116.6 kg)    Intake/Output:   Intake/Output Summary (Last 24 hours) at 07/14/2017 0757 Last data filed at 07/14/2017 0700 Gross per 24 hour  Intake 685 ml  Output 1050 ml  Net -365 ml      Physical Exam    General:  No resp difficulty HEENT: Normal Neck: Supple. JVP 6-7. Carotids 2+ bilat; no bruits. No lymphadenopathy or thyromegaly appreciated. Cor: PMI nondisplaced. Tachy, regular. Soft s3. No Joshua Lungs: Clear Abdomen: obese, soft, nontender, nondistended. No hepatosplenomegaly. No bruits or masses. Good bowel sounds. Extremities: no cyanosis, clubbing, rash, edema Neuro: alert & oriented x 3, cranial nerves grossly intact. moves all 4 extremities w/o difficulty. Affect pleasant   Telemetry   Sinus tach 110s. Personally reviewed.   EKG    No new tracings.   Labs    CBC No results for input(s): WBC, NEUTROABS, HGB, HCT, MCV, PLT in the last 72 hours. Basic Metabolic Panel Recent Labs    40/98/1106/24/19 0430  NA 140  K 4.0  CL 104  CO2 29  GLUCOSE 100*  BUN 19    CREATININE 1.10  CALCIUM 8.6*   Liver Function Tests No results for input(s): AST, ALT, ALKPHOS, BILITOT, PROT, ALBUMIN in the last 72 hours. No results for input(s): LIPASE, AMYLASE in the last 72 hours. Cardiac Enzymes No results for input(s): CKTOTAL, CKMB, CKMBINDEX, TROPONINI in the last 72 hours.  BNP: BNP (last 3 results) Recent Labs    05/05/17 0943 06/16/17 1159 07/11/17 0527  BNP 40.3 39.2 567.1*    ProBNP (last 3 results) No results for input(s): PROBNP in the last 8760 hours.   D-Dimer No results for input(s): DDIMER in the last 72 hours. Hemoglobin A1C No results for input(s): HGBA1C in the last 72 hours. Fasting Lipid Panel No results for input(s): CHOL, HDL, LDLCALC, TRIG, CHOLHDL, LDLDIRECT in the last 72 hours. Thyroid Function Tests No results for input(s): TSH, T4TOTAL, T3FREE, THYROIDAB in the last 72 hours.  Invalid input(s): FREET3  Other results:   Imaging     No results found.   Medications:     Scheduled Medications: . aspirin EC  81 mg Oral Daily  . atorvastatin  80 mg Oral Daily  . carvedilol  3.125 mg Oral BID WC  . Chlorhexidine Gluconate Cloth  6 each Topical Daily  . digoxin  0.125 mg Oral Daily  . enoxaparin (LOVENOX) injection  40 mg Subcutaneous QHS  . furosemide  40 mg Oral BID  .  losartan  12.5 mg Oral Daily  . mouth rinse  15 mL Mouth Rinse BID  . moving right along book   Does not apply Once  . pantoprazole  40 mg Oral Daily  . potassium chloride  20 mEq Oral Daily  . sodium chloride flush  10-40 mL Intracatheter Q12H  . sodium chloride flush  10-40 mL Intracatheter Q12H  . sodium chloride flush  3 mL Intravenous Q12H  . spironolactone  25 mg Oral Daily  . warfarin  7.5 mg Oral q1800  . Warfarin - Physician Dosing Inpatient   Does not apply q1800     Infusions: . sodium chloride 10 mL/hr at 07/10/17 0400  . lactated ringers    . lactated ringers Stopped (07/09/17 0958)     PRN  Medications:  HYDROcodone-acetaminophen, ondansetron (ZOFRAN) IV, sodium chloride flush, sodium chloride flush, sodium chloride flush    Patient Profile   Joshua May is a  42 year old with history of CAD, STEMI 2010 DES RCA, CFX infarct 2017 total mCFX and 85% OM3. He had CFX PCI with DES and OM3 POBA, PE 2019, Joshua, smoker, and chronic systolic heart failure.   Admitted for scheduled mitral valve surgery.   Assessment/Plan   1. Severe Joshua- S/P Minimally invasive mitral valve repair on 6/19  ECHO  6/24 Post surgery. No Joshua  Post op care per CT surgery. No change.   2. A/C Systolic Heart Failure  ECHO 07/12/2017 EF 25-30%.  Volume status ok.  Will need to be careful with HF medication titration with hypotension noted in the outpatient setting.    CO-OX 54% on 6/24 (no longer has PICC). SBP 100-120s - Continue lasix 40 mg BID - DC coreg. Hold off on corlanor. Discussed with Dr Gala Romney.  - Increase digoxin to 0.25 mg daily - Continue spiro 25 mg daily.  BMET pending this am.  - Continue losartan 12.5 mg daily.  3. CAD  Stents in LCX and RCA. LHC cath 02/2017 nonobstructive disease. - No s/s ischemia - Continue aspirin and statin.   4. H/O PE  CTA 02/2017  PE  Etiology. Father has venous atheroembolism.  - + lupus anticoagulant but with xarelto false positive so no need for hematology consult. .  - Continue lifelong Xarelto.No change.   Medication concerns reviewed with patient and pharmacy team. Barriers identified: no insurance.   Stable for discharge from HF perspective. He has follow up in HF clinic.   HF medication recommendations for discharge: Lasix 40 mg BID Digoxin 0.25 mg daily Spiro 25 mg daily Losartan 12.5 mg daily  Length of Stay: 7  Joshua Highland, NP  07/14/2017, 7:57 AM  Advanced Heart Failure Team Pager [phone] (M-F; 7a - 4p)  Please contact [facility] for night-coverage after hours (4p -7a ) and weekends on [email or web address]  Patient seen and  examined with the above-signed Advanced Practice Provider and/or Housestaff. I personally reviewed laboratory data, imaging studies and relevant notes. I independently examined the patient and formulated the important aspects of the plan. I have edited the note to reflect any of my changes or salient points. I have personally discussed the plan with the patient and/or family.  Overall tenuous but stable. Ok for d/c home today. Wil follow closely in HF Clinic. Can add ivabradine slowly as needed. Would avoid b-blocker for now as he will not tolerate the negative inotropy well.   Arvilla Meres, MD  2:52 PM

## 2017-07-16 ENCOUNTER — Ambulatory Visit (INDEPENDENT_AMBULATORY_CARE_PROVIDER_SITE_OTHER): Payer: Medicaid Other | Admitting: *Deleted

## 2017-07-16 ENCOUNTER — Other Ambulatory Visit: Payer: Self-pay | Admitting: Thoracic Surgery (Cardiothoracic Vascular Surgery)

## 2017-07-16 ENCOUNTER — Encounter (INDEPENDENT_AMBULATORY_CARE_PROVIDER_SITE_OTHER): Payer: Self-pay

## 2017-07-16 DIAGNOSIS — D6862 Lupus anticoagulant syndrome: Secondary | ICD-10-CM | POA: Diagnosis not present

## 2017-07-16 DIAGNOSIS — Z5181 Encounter for therapeutic drug level monitoring: Secondary | ICD-10-CM | POA: Diagnosis not present

## 2017-07-16 DIAGNOSIS — Z9889 Other specified postprocedural states: Secondary | ICD-10-CM | POA: Diagnosis not present

## 2017-07-16 DIAGNOSIS — I340 Nonrheumatic mitral (valve) insufficiency: Secondary | ICD-10-CM | POA: Diagnosis not present

## 2017-07-16 LAB — POCT INR: INR: 2.4 (ref 2.0–3.0)

## 2017-07-16 NOTE — Patient Instructions (Addendum)
A full discussion of the nature of anticoagulants has been carried out.  A benefit risk analysis has been presented to the patient, so that they understand the justification for choosing anticoagulation at this time. The need for frequent and regular monitoring, precise dosage adjustment and compliance is stressed.  Side effects of potential bleeding are discussed.  The patient should avoid any OTC items containing aspirin or ibuprofen, and should avoid great swings in general diet.  Avoid alcohol consumption.  Call if any signs of abnormal bleeding.  Description   Start taking 1 tablet daily except 1/2 tablet on Sundays and Wednesdays. Recheck INR in 1 week. Call with any new medications or procedures Coumadin Clinic [phone] Main [phone]

## 2017-07-19 MED FILL — ATORVASTATIN 80 MG TABLET: 80 | 30 days supply | Qty: 30 | Fill #2

## 2017-07-19 MED FILL — LOSARTAN POTASSIUM 25 MG TA: 25 | 30 days supply | Qty: 15 | Fill #2

## 2017-07-19 NOTE — Anesthesia Postprocedure Evaluation (Signed)
Anesthesia Post Note  Patient: Araceli H Sherry  Procedure(s) Performed: MINIMALLY INVASIVE MITRAL VALVE REPAIR (MVR) using Carpentier-McCarthy Adams Ring size 26 (Right Chest) TRANSESOPHAGEAL ECHOCARDIOGRAM (TEE) (N/A ) PATENT FORAMEN OVALE (PFO) CLOSURE (N/A Chest)     Patient location during evaluation: SICU Anesthesia Type: General Level of consciousness: sedated Pain management: pain level controlled Vital Signs Assessment: post-procedure vital signs reviewed and stable Respiratory status: patient remains intubated per anesthesia plan Cardiovascular status: stable Postop Assessment: no apparent nausea or vomiting Anesthetic complications: no    Last Vitals:  Vitals:   07/14/17 0720 07/14/17 0845  BP: 116/64   Pulse: (!) 116 (!) 118  Resp: 19   Temp: 37.1 C   SpO2:      Last Pain:  Vitals:   07/14/17 0930  TempSrc:   PainSc: Asleep   Pain Goal: Patients Stated Pain Goal: 3 (07/13/17 0517)               Katasha Riga S

## 2017-07-20 NOTE — Progress Notes (Signed)
PCP: None  Primary Cardiologist: Dr Antoine Poche HF MD: Dr Shirlee Latch  CT Surgery: Dr Cornelius Moras  HPI: Joshua May is a 42 y.o. [name] male with a history of CAD dating back to 2007 when he had a PCI in [place]. In 2010 he presented with a STEMI and had an RCA DES placed. He followed up for a year but then didn't present again till Oct 2017 when he presented with a CFX infarct. Cath revealed 50% ISR of the RCA, total mCFX and 85% OM3. He had CFX PCI with DES and OM3 POBA. His EF then was 50-55%.  Admitted 02/27/17 with increased dyspnea and chest pain. CTA confirmed PE. ECHO completed and showed reduced EF 20-25%. Underwent LHC as noted below. [place] no evidence DVTs. CT surgery consulted for severe MR. Plan to optimize HF medications.Set up TEE as an outpatient. Discharge weight 222 pounds.   Admitted 06/2017 with scheduled MV repair. S/P Minimally invasive mitral valve repair on 6/19. ECHO 6/24 Post surgery no MR was noted. Tachycardic at the time of discharge. Discharge weight 257 pounds.   Today he returns for post hospital HF follow up. Complaining of dizziness and fatigue. Over the last few days he has been having groin pain when he walks.  He had some relief after fluid started draining out of right and left groin incisions. Having some chills and sweats. Complaining of pain in right upper chest. Denies SOB/PND/Orthopnea. Appetite ok.  Weight at home 252-254 pounds. No bleeding problems. Taking all medications.  TEE 04/19/17 LVEF 35-40%, Severe MR with PISA ERO 0.43 cm^2. Suspect infarct related MR with tethering of the posterior leaflet.   ECHO 03/01/2017  Mildly dilated LV with EF 25-30%, diffuse hypokinesis with   inferolateral akinesis. Restrictive diastolic function. RV poorly   visualized but appears normal in size with mildly decreased   systolic function. D-shaped interventricular septum is suggestive   of RV pressure/volume overload. Moderate pulmonary hypertension.   Moderate to severe mitral  regurgitation, suspect infarct-related   MR with restricted posterior leaflet and akinetic inferolateral   wall.  LHC 03/03/2017   Previously placed Prox Cx to Mid Cx stent (unknown type) is widely patent.  Prox Cx lesion is 45% stenosed. - proximal to the Stent -- FFR 0.88.  Prox RCA to Mid RCA stent is 55% stenosed.  Dist LAD lesion is 20% stenosed.  LV end diastolic pressure is severely elevated. 30-31 mmHg  Review of systems complete and found to be negative unless listed in HPI.    SH:  Social History   Socioeconomic History  . Marital status: Married    Spouse name: Not on file  . Number of children: Not on file  . Years of education: Not on file  . Highest education level: Not on file  Occupational History  . Not on file  Social Needs  . Financial resource strain: Not on file  . Food insecurity:    Worry: Not on file    Inability: Not on file  . Transportation needs:    Medical: Not on file    Non-medical: Not on file  Tobacco Use  . Smoking status: Former Smoker    Packs/day: 0.50    Years: 17.00    Pack years: 8.50    Types: Cigarettes  . Smokeless tobacco: Never Used  . Tobacco comment: quit since heart attack in 10/2015  Substance and Sexual Activity  . Alcohol use: Not Currently  . Drug use: Not Currently    Types:  Marijuana  . Sexual activity: Not on file  Lifestyle  . Physical activity:    Days per week: Not on file    Minutes per session: Not on file  . Stress: Not on file  Relationships  . Social connections:    Talks on phone: Not on file    Gets together: Not on file    Attends religious service: Not on file    Active member of club or organization: Not on file    Attends meetings of clubs or organizations: Not on file    Relationship status: Not on file  . Intimate partner violence:    Fear of current or ex partner: Not on file    Emotionally abused: Not on file    Physically abused: Not on file    Forced sexual activity: Not on  file  Other Topics Concern  . Not on file  Social History Narrative   The patient is married with two children.    FH:  Family History  Problem Relation Age of Onset  . Other Sister 57       THE PATIENT REPORTS A SISTER WITH A MYOCARDIAL INFARCTION IN HER 30s.(SHE IS NOW S/P CABG)  . Heart attack Father   . Deep vein thrombosis Father     Past Medical History:  Diagnosis Date  . Asthma   . CAD (coronary artery disease)   . Chronic systolic CHF (congestive heart failure) (HCC)   . HLD (hyperlipidemia)    Qualifier: Diagnosis of  By: Antoine Poche, MD, Gerrit Heck    . Hypercholesteremia   . Inferior myocardial infarction [facility]) 2007   PCI of RCA  . Inferior myocardial infarction (HCC) 03/31/2008   PCI of RCA with bare metal stent  . Ischemic cardiomyopathy   . Migraines    and dizziness  . Mitral regurgitation   . Obstructive sleep apnea    Qualifier: Diagnosis of  By: Antoine Poche, MD, Gerrit Heck    . 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 Pulmonary embolism (HCC) 02/28/2017  . 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 Tobacco abuse     Current Outpatient Medications  Medication Sig Dispense Refill  . aspirin EC 81 MG EC tablet Take 1 tablet (81 mg total) by mouth daily.    Marland Kitchen atorvastatin (LIPITOR) 80 MG tablet Take 1 tablet (80 mg total) by mouth daily. 30 tablet 2  . digoxin (LANOXIN) 0.25 MG tablet Take 1 tablet (0.25 mg total) by mouth daily. 30 tablet 1  . ferrous sulfate 325 (65 FE) MG tablet Take 1 tablet (325 mg total) by mouth daily with breakfast. For one month then stop. 30 tablet 3  . folic acid (FOLVITE) 1 MG tablet Take 1 tablet (1 mg total) by mouth daily. For one month then stop.    . furosemide (LASIX) 40 MG tablet Take 1 tablet (40 mg total) by mouth 2 (two) times daily. 60 tablet 1  . HYDROcodone-acetaminophen (NORCO) 10-325 MG tablet Take one tablet by mouth  every 4-6 hours PRN severe pain. 30 tablet 0  . losartan (COZAAR) 25 MG tablet Take 0.5 tablets (12.5 mg total) by mouth daily. (Patient taking differently: Take 12.5 mg by mouth every evening. ) 15 tablet 2  . Meclizine HCl 25 MG CHEW Chew 1 tablet (25 mg total) by mouth daily as needed. (Patient taking differently: Chew 25 mg  by mouth daily as needed (for dizziness/vertigo). ) 30 tablet 0  . spironolactone (ALDACTONE) 25 MG tablet Take 1 tablet (25 mg total) by mouth daily. 30 tablet 1  . warfarin (COUMADIN) 7.5 MG tablet Take 1 tablet (7.5 mg total) by mouth daily at 6 PM. Or as directed 30 tablet 1   No current facility-administered medications for this encounter.    Doppler 86  Vitals:   07/21/17 1425  Pulse: (!) 117  SpO2: 98%  Weight: 253 lb (114.8 kg)    Wt Readings from Last 3 Encounters:  07/21/17 253 lb (114.8 kg)  07/14/17 257 lb (116.6 kg)  07/05/17 269 lb (122 kg)    PHYSICAL EXAM: General: Appears fatigued.  No resp difficulty HEENT: normal Neck: supple. JVD  5-6 . Carotids 2+ bilat; no bruits. No lymphadenopathy or thryomegaly appreciated. Cor: PMI nondisplaced. Tachy Regular rate & rhythm. No rubs, gallops or murmurs. R chest fullness. Incision right chest approximated.  Lungs: clear Abdomen: soft, nontender, nondistended. No hepatosplenomegaly. No bruits or masses. Good bowel sounds. Extremities: no cyanosis, clubbing, rash, edema Neuro: alert & orientedx3, cranial nerves grossly intact. moves all 4 extremities w/o difficulty. Affect pleasant Skin: R groin incision with serous exudate L groin incision with sero-sang exudate and induration. + odor from incisions.   EKG: Sinus Tach 112 bpm   ASSESSMENT & PLAN: 1. Chronic Systolic Heart Failure, ICM - ECHO 06/2017 25-30%  I concerned he has infection R/L groin site. Hypotensive.  -No BB with hypotension.  -Hold lasix and spironolactone for now.  -Continue losartan daily.  - Check BMET  2. CAD - Stents in LCX  and RCA. LHC cath 02/2017 nonobstructive disease.  - No s/s ischemia    - Continue statin. No bb with hypotension.  - On xarelto so no aspirin.  3. PE - CTA 02/2017 with PE note.  - ? Etiology. Father has venous atheroembolism.  - + lupus anticoagulant but with xarelto false positive so no need for hematology consult. .  - Continue lifelong Xarelto.   - No change to current plan.   4. Severe Mitral Regurgitation - TEE 04/19/17 - LVEF 35-40%, Severe MR with PISA ERO 0.43 cm^2. Suspect infarct related MR with tethering of the posterior leaflet.  06/2017 S/P Minimally invasive mitral valve repair on 6/19  ECHO 6/24 Post surgery. No MR  Drainage and odor noted from R and LLE  Groin incision. I obtained wound culture. Check CBC --->WBC 16.7 Start doxycylcine 100 mg twice a day for 10 days.  I called Gershon CraneWayne Gold PA-C with CT surgery to assess. He agreed with the above plan. - He has follow up with Dr Cornelius Moraswen next week. I have forwarded CBC to Dr Cornelius Moraswen.  5. Former Smoker - Encouraged continued abstinence.  6. Hypotension Stopped diuretics. May need to stop losartan.  Concerned he is infected. WBC elevated.   Follow up in 3 weeks with Dr Shirlee LatchMcLean.    Tonye BecketAmy Clegg, NP  2:28 PM

## 2017-07-21 ENCOUNTER — Ambulatory Visit (HOSPITAL_COMMUNITY)
Admit: 2017-07-21 | Discharge: 2017-07-21 | Disposition: A | Discharge status: Home / Self Care | Payer: Self-pay | Source: Ambulatory Visit | Attending: Internal Medicine | Admitting: Internal Medicine

## 2017-07-21 ENCOUNTER — Other Ambulatory Visit (HOSPITAL_COMMUNITY): Payer: Self-pay

## 2017-07-21 ENCOUNTER — Encounter (HOSPITAL_COMMUNITY): Payer: Self-pay

## 2017-07-21 VITALS — HR 117 | Wt 253.0 lb

## 2017-07-21 DIAGNOSIS — Z87891 Personal history of nicotine dependence: Secondary | ICD-10-CM | POA: Insufficient documentation

## 2017-07-21 DIAGNOSIS — Z8249 Family history of ischemic heart disease and other diseases of the circulatory system: Secondary | ICD-10-CM | POA: Insufficient documentation

## 2017-07-21 DIAGNOSIS — E785 Hyperlipidemia, unspecified: Secondary | ICD-10-CM | POA: Insufficient documentation

## 2017-07-21 DIAGNOSIS — E7800 Pure hypercholesterolemia, unspecified: Secondary | ICD-10-CM | POA: Insufficient documentation

## 2017-07-21 DIAGNOSIS — I252 Old myocardial infarction: Secondary | ICD-10-CM | POA: Insufficient documentation

## 2017-07-21 DIAGNOSIS — T148XXA Other injury of unspecified body region, initial encounter: Secondary | ICD-10-CM

## 2017-07-21 DIAGNOSIS — Z86711 Personal history of pulmonary embolism: Secondary | ICD-10-CM | POA: Insufficient documentation

## 2017-07-21 DIAGNOSIS — Z955 Presence of coronary angioplasty implant and graft: Secondary | ICD-10-CM | POA: Insufficient documentation

## 2017-07-21 DIAGNOSIS — I255 Ischemic cardiomyopathy: Secondary | ICD-10-CM

## 2017-07-21 DIAGNOSIS — I5022 Chronic systolic (congestive) heart failure: Secondary | ICD-10-CM

## 2017-07-21 DIAGNOSIS — Z7901 Long term (current) use of anticoagulants: Secondary | ICD-10-CM | POA: Insufficient documentation

## 2017-07-21 DIAGNOSIS — Z79899 Other long term (current) drug therapy: Secondary | ICD-10-CM | POA: Insufficient documentation

## 2017-07-21 DIAGNOSIS — I2510 Atherosclerotic heart disease of native coronary artery without angina pectoris: Secondary | ICD-10-CM | POA: Insufficient documentation

## 2017-07-21 DIAGNOSIS — I959 Hypotension, unspecified: Secondary | ICD-10-CM

## 2017-07-21 DIAGNOSIS — L24A9 Irritant contact dermatitis due friction or contact with other specified body fluids: Secondary | ICD-10-CM

## 2017-07-21 DIAGNOSIS — G4733 Obstructive sleep apnea (adult) (pediatric): Secondary | ICD-10-CM | POA: Insufficient documentation

## 2017-07-21 DIAGNOSIS — Z7982 Long term (current) use of aspirin: Secondary | ICD-10-CM | POA: Insufficient documentation

## 2017-07-21 DIAGNOSIS — I340 Nonrheumatic mitral (valve) insufficiency: Secondary | ICD-10-CM | POA: Insufficient documentation

## 2017-07-21 DIAGNOSIS — T8131XA Disruption of external operation (surgical) wound, not elsewhere classified, initial encounter: Secondary | ICD-10-CM

## 2017-07-21 DIAGNOSIS — Z9889 Other specified postprocedural states: Secondary | ICD-10-CM

## 2017-07-21 LAB — BASIC METABOLIC PANEL
ANION GAP: 11 (ref 5–15)
BUN: 18 mg/dL (ref 6–20)
CALCIUM: 9 mg/dL (ref 8.9–10.3)
CO2: 22 mmol/L (ref 22–32)
CREATININE: 0.95 mg/dL (ref 0.61–1.24)
Chloride: 104 mmol/L (ref 98–111)
GFR calc Af Amer: >60 mL/min (ref >60)
GFR calc non Af Amer: >60 mL/min (ref >60)
GLUCOSE: 110 mg/dL — AB (ref 70–99)
Potassium: 4.5 mmol/L (ref 3.5–5.1)
Sodium: 137 mmol/L (ref 135–145)

## 2017-07-21 LAB — CBC
HCT: 35.7 % — ABNORMAL LOW (ref 39.0–52.0)
HEMOGLOBIN: 10.8 g/dL — AB (ref 13.0–17.0)
MCH: 29.3 pg (ref 26.0–34.0)
MCHC: 30.3 g/dL (ref 30.0–36.0)
MCV: 96.7 fL (ref 78.0–100.0)
Platelets: 604 10*3/uL — ABNORMAL HIGH (ref 150–400)
RBC: 3.69 MIL/uL — ABNORMAL LOW (ref 4.22–5.81)
RDW: 12.4 % (ref 11.5–15.5)
WBC: 16.7 10*3/uL — ABNORMAL HIGH (ref 4.0–10.5)

## 2017-07-21 MED ORDER — DOXYCYCLINE HYCLATE 50 MG PO CAPS: 100.0000 mg | ORAL_CAPSULE | Freq: Two times a day (BID) | ORAL | 0 refills | Status: DC

## 2017-07-21 MED ORDER — HYDROCODONE-ACETAMINOPHEN 10-325 MG PO TABS: ORAL_TABLET | ORAL | 0 refills | Status: DC

## 2017-07-21 NOTE — Patient Instructions (Signed)
STOP Spironolactone STOP Lasix START Doxycycline 100 mg,  One tab twice a day for 10 days   Your physician recommends that you schedule a follow-up appointment in: 3 weeks with Dr. Shirlee LatchMcLean   Do the following things EVERYDAY: 1) Weigh yourself in the morning before breakfast. Write it down and keep it in a log. 2) Take your medicines as prescribed 3) Eat low salt foods-Limit salt (sodium) to 2000 mg per day.  4) Stay as active as you can everyday 5) Limit all fluids for the day to less than 2 liters

## 2017-07-23 ENCOUNTER — Ambulatory Visit (INDEPENDENT_AMBULATORY_CARE_PROVIDER_SITE_OTHER): Payer: Self-pay | Admitting: Pharmacist

## 2017-07-23 DIAGNOSIS — D6862 Lupus anticoagulant syndrome: Secondary | ICD-10-CM

## 2017-07-23 DIAGNOSIS — Z5181 Encounter for therapeutic drug level monitoring: Secondary | ICD-10-CM

## 2017-07-23 DIAGNOSIS — Z9889 Other specified postprocedural states: Secondary | ICD-10-CM

## 2017-07-23 DIAGNOSIS — I340 Nonrheumatic mitral (valve) insufficiency: Secondary | ICD-10-CM

## 2017-07-23 LAB — POCT INR: INR: 2.7 (ref 2.0–3.0)

## 2017-07-23 NOTE — Patient Instructions (Signed)
Description   Continue 1 tablet daily except 1/2 tablet on Sundays and Wednesdays. Recheck INR in 1 week. Call with any new medications or procedures Coumadin Clinic [phone] Main [phone]

## 2017-07-24 LAB — AEROBIC CULTURE  (SUPERFICIAL SPECIMEN)

## 2017-07-24 LAB — AEROBIC CULTURE W GRAM STAIN (SUPERFICIAL SPECIMEN)

## 2017-07-26 ENCOUNTER — Ambulatory Visit (INDEPENDENT_AMBULATORY_CARE_PROVIDER_SITE_OTHER): Payer: Self-pay | Admitting: Thoracic Surgery (Cardiothoracic Vascular Surgery)

## 2017-07-26 ENCOUNTER — Encounter: Payer: Self-pay | Admitting: Thoracic Surgery (Cardiothoracic Vascular Surgery)

## 2017-07-26 ENCOUNTER — Ambulatory Visit
Admission: RE | Admit: 2017-07-26 | Discharge: 2017-07-26 | Disposition: A | Discharge status: Home / Self Care | Payer: Medicaid Other | Source: Ambulatory Visit | Attending: Thoracic Surgery (Cardiothoracic Vascular Surgery) | Admitting: Thoracic Surgery (Cardiothoracic Vascular Surgery)

## 2017-07-26 ENCOUNTER — Telehealth (HOSPITAL_COMMUNITY): Payer: Self-pay

## 2017-07-26 ENCOUNTER — Other Ambulatory Visit: Payer: Self-pay

## 2017-07-26 VITALS — BP 96/66 | HR 103 | Resp 16 | Ht 71.0 in | Wt 249.6 lb

## 2017-07-26 DIAGNOSIS — Z9889 Other specified postprocedural states: Secondary | ICD-10-CM

## 2017-07-26 DIAGNOSIS — Z8774 Personal history of (corrected) congenital malformations of heart and circulatory system: Secondary | ICD-10-CM

## 2017-07-26 MED ORDER — LEVOFLOXACIN 500 MG PO TABS: 500.0000 mg | ORAL_TABLET | Freq: Every day | ORAL | 0 refills | Status: DC

## 2017-07-26 NOTE — Patient Instructions (Signed)
Continue all previous medications without any changes at this time  You may continue to gradually increase your physical activity as tolerated.  Refrain from any heavy lifting or strenuous use of your arms and shoulders until at least 8 weeks from the time of your surgery, and avoid activities that cause increased pain in your chest on the side of your surgical incision.  Otherwise you may continue to increase activities without any particular limitations.  Increase the intensity and duration of physical activity gradually.  

## 2017-07-26 NOTE — Telephone Encounter (Signed)
Result Notes for Aerobic Culture (superficial specimen)   Notes recorded by Chyrl CivatteBradley, Valor [place] G, RN on 07/26/2017 at 10:48 AM EDT Wife made aware, [facility]. Patient feeling some better since seen last week. ------  Notes recorded by Sherald Hesslegg, Amy D, NP on 07/26/2017 at 10:40 AM EDT Stop doxycycline. Start levaquin 500 mg daily for 7 days. Please call.

## 2017-07-26 NOTE — Progress Notes (Signed)
[address]       [place] [address]             [phone]     CARDIOTHORACIC SURGERY OFFICE NOTE  Referring Provider is Laurey Morale, MD  Primary Cardiologist is Rollene Rotunda, MD PCP is Patient, No Pcp Per   HPI:  Patient is a 42 year old African-American male with history of premature coronary artery disease status post multiple previous myocardial infarctions treated with PCI and stenting,ischemic cardiomyopathy, chronic systolic congestive heart failure, hyperlipidemia, obstructive sleep apnea,lupus anticoagulant positive with h/o PEand long-standing tobacco abuse whoreturns to the office today for routine follow up status post minimally invasive mitral valve repair using an undersized ring annuloplasty on July 07, 2017 for stage D severe symptomatic secondary mitral regurgitation refractory to medical therapy.  The patient's early postoperative convalescence in the hospital was uncomplicated although notable for the need for slow gradual weaning of intravenous pressors using milrinone given his underlying severe chronic systolic congestive heart failure.  He was followed closely in the hospital by the advanced heart failure team and was eventually discharged home on the seventh postoperative day.  He did not have any rhythm disturbances after surgery although he remained in sinus tachycardia heart rate ranging 100-110.  A conscious decision was made to avoid adding beta-blockers because of the patient's underlying profound left ventricular systolic dysfunction.  Echocardiogram performed prior to discharge on July 12, 2017 revealed severe left ventricular systolic function with ejection fraction estimated 25 to 30%.  There was diffuse hypokinesis of the left ventricle with disproportionate hypokinesis of the inferior and lateral wall.  The mitral valve repair appeared intact with no residual mitral regurgitation.  Mean transvalvular gradient was estimated 9  mmHg.  Since hospital discharge his prothrombin time has been checked and Coumadin dose adjusted through the Coumadin clinic.  He was seen in follow-up in the advanced heart failure clinic last week and noted to have a small amount of drainage from his right groin incision.  Swab cultures were obtained and the patient was started on oral doxycycline.  Cultures grew multiple organisms including E. Coli and Raoultella planticola, and his antibiotics were changed earlier today to oral Levaquin.  When he was seen in the office last week his white blood count was elevated 16,000 although he was afebrile.  All diuretics were stopped at that time.  He returns to our office today and reports that he is feeling better.  His appetite is good.  His weight has been stable and actually has continued to decrease since hospital discharge.  He gets short of breath with activity but this is continued to improve gradually since hospital discharge.  He has not had lower extremity edema.  He is sleeping well at night.  He still has soreness in his right chest related to his surgery but this is slowly improving.  He states that the drainage in his groin has stopped.   Current Outpatient Medications  Medication Sig Dispense Refill  . aspirin EC 81 MG EC tablet Take 1 tablet (81 mg total) by mouth daily.    Marland Kitchen atorvastatin (LIPITOR) 80 MG tablet Take 1 tablet (80 mg total) by mouth daily. 30 tablet 2  . digoxin (LANOXIN) 0.25 MG tablet Take 1 tablet (0.25 mg total) by mouth daily. 30 tablet 1  . ferrous sulfate 325 (65 FE) MG tablet Take 1 tablet (325 mg total) by mouth daily with breakfast. For one month then stop. 30  tablet 3  . folic acid (FOLVITE) 1 MG tablet Take 1 tablet (1 mg total) by mouth daily. For one month then stop.    Marland Kitchen. HYDROcodone-acetaminophen (NORCO) 10-325 MG tablet Take one tablet by mouth every 4-6 hours PRN severe pain. 30 tablet 0  . levofloxacin (LEVAQUIN) 500 MG tablet Take 1 tablet (500 mg total) by  mouth daily after breakfast. 7 tablet 0  . losartan (COZAAR) 25 MG tablet Take 0.5 tablets (12.5 mg total) by mouth daily. (Patient taking differently: Take 12.5 mg by mouth every evening. ) 15 tablet 2  . warfarin (COUMADIN) 7.5 MG tablet Take 1 tablet (7.5 mg total) by mouth daily at 6 PM. Or as directed 30 tablet 1  . Meclizine HCl 25 MG CHEW Chew 1 tablet (25 mg total) by mouth daily as needed. (Patient not taking: Reported on 07/21/2017) 30 tablet 0   No current facility-administered medications for this visit.       Physical Exam:   BP 96/66 (BP Location: Left Arm, Patient Position: Sitting, Cuff Size: Large)   Pulse (!) 103   Resp 16   Ht 5\' 11"  (1.803 m)   Wt 249 lb 9.6 oz (113.2 kg)   SpO2 94% Comment: RA  BMI 34.81 kg/m   General:  Well-appearing  Chest:   Clear to auscultation with symmetrical breath sounds  CV:   Regular rate and rhythm without murmur  Incisions:  Healing nicely.  Slight skin separation right groin incision with no ongoing drainage and no surrounding cellulitis.  Left groin looks okay.  Abdomen:  Soft nontender  Extremities:  Warm and well-perfused, no edema  Diagnostic Tests:  CHEST - 2 VIEW  COMPARISON:  07/11/2017, 07/09/2017  FINDINGS: Cardiomediastinal silhouette is unchanged with with no evidence of central vascular congestion. Evidence of prior mitral valve annuloplasty.  Meniscus on the lateral view opacifying the costophrenic sulcus.  Opacity on the right base with partial obscuration the right hemidiaphragm. Linear opacity of the right mid lung.  Interval resolution of pneumothorax. No new airspace opacity on the left.  No acute displaced fracture.  IMPRESSION: Persisting right-sided pleural effusion and associated atelectasis/consolidation.  Interval resolution of the right pneumothorax.  Redemonstration of changes of prior mitral valve annuloplasty.   Electronically Signed   By: Gilmer MorJaime  Wagner D.O.   On:  07/26/2017 16:49   Impression:  Patient is clinically stable and slowly improving approximately 3 weeks status post minimally invasive mitral valve repair using an undersized ring annuloplasty for stage D severe symptomatic secondary mitral regurgitation caused by severe ischemic cardiomyopathy.  Patient appeared to have a minor right groin wound infection when he was seen in the advanced heart failure clinic last week.  Symptoms have improved and wound drainage resolved on oral antibiotics and there is no sign of ongoing infection at this time.  Clinically the patient is slowly improving and appears essentially euvolemic on exam now off all diuretics.     Plan:  We have not recommended any change the patient's current medications.  I have encouraged the patient to continue to slowly and gradually increase his physical activity as tolerated based upon residual soreness in his chest and exertional shortness of breath.  I reminded him to keep a close eye on his weight and any other potential signs of fluid overload such as lower extremity edema.  I have asked him to keep an eye on his groin incisions and call to schedule appointment and return to our office should any increased  drainage or other signs of infection develop.  Otherwise he will continue to follow-up closely in the advanced heart failure clinic.  We will plan to see him back in approximately 2 months or sooner as needed.  All of his questions have been addressed.    Salvatore Decent. Cornelius Moras, MD 07/26/2017 5:11 PM

## 2017-07-26 NOTE — Anesthesia Procedure Notes (Signed)
Central Venous Catheter Insertion Performed by: Heather RobertsSinger, Kauan Kloosterman, MD, anesthesiologist Start/End6/19/2019 7:48 AM, 07/07/2017 7:58 AM Patient location: Pre-op. Preanesthetic checklist: patient identified, IV checked, site marked, risks and benefits discussed, surgical consent, monitors and equipment checked, pre-op evaluation, timeout performed and anesthesia consent Position: Trendelenburg Lidocaine 1% used for infiltration and patient sedated Hand hygiene performed , maximum sterile barriers used  and Seldinger technique used Catheter size: 8.5 Fr Total catheter length 8. PA cath was placed.Sheath introducer Swan type:thermodilution PA Cath depth:50 Procedure performed using ultrasound guided technique. Ultrasound Notes:anatomy identified, needle tip was noted to be adjacent to the nerve/plexus identified, no ultrasound evidence of intravascular and/or intraneural injection and image(s) printed for medical record Attempts: 1 Following insertion, line sutured and dressing applied. Post procedure assessment: free fluid flow, blood return through all ports and no air  Patient tolerated the procedure well with no immediate complications.

## 2017-07-26 NOTE — Addendum Note (Signed)
Addendum  created 07/26/17 2050 by Heather RobertsSinger, Oceanna Arruda, MD   Child order released for a procedure order, Intraprocedure Blocks edited, LDA created via procedure documentation, Sign clinical note

## 2017-07-30 ENCOUNTER — Telehealth (HOSPITAL_COMMUNITY): Payer: Self-pay

## 2017-07-30 ENCOUNTER — Ambulatory Visit (INDEPENDENT_AMBULATORY_CARE_PROVIDER_SITE_OTHER): Payer: Self-pay | Admitting: *Deleted

## 2017-07-30 DIAGNOSIS — Z9889 Other specified postprocedural states: Secondary | ICD-10-CM

## 2017-07-30 DIAGNOSIS — I340 Nonrheumatic mitral (valve) insufficiency: Secondary | ICD-10-CM

## 2017-07-30 DIAGNOSIS — D6862 Lupus anticoagulant syndrome: Secondary | ICD-10-CM

## 2017-07-30 DIAGNOSIS — Z5181 Encounter for therapeutic drug level monitoring: Secondary | ICD-10-CM

## 2017-07-30 LAB — POCT INR: INR: 2.8 (ref 2.0–3.0)

## 2017-07-30 NOTE — Patient Instructions (Signed)
Description   Continue taking 1 tablet daily except 1/2 tablet on Sundays and Wednesdays. Recheck INR in 1 week. Call with any new medications or procedures Coumad[facility] Clinic [phone] Main [phone]

## 2017-07-30 NOTE — Telephone Encounter (Signed)
Called and spoke with patient in regards to Cardiac Rehab - Patient stated he is not interested. Closed referral. °

## 2017-08-06 ENCOUNTER — Ambulatory Visit (INDEPENDENT_AMBULATORY_CARE_PROVIDER_SITE_OTHER): Payer: Self-pay

## 2017-08-06 DIAGNOSIS — Z5181 Encounter for therapeutic drug level monitoring: Secondary | ICD-10-CM

## 2017-08-06 DIAGNOSIS — Z9889 Other specified postprocedural states: Secondary | ICD-10-CM

## 2017-08-06 DIAGNOSIS — D6862 Lupus anticoagulant syndrome: Secondary | ICD-10-CM

## 2017-08-06 DIAGNOSIS — I340 Nonrheumatic mitral (valve) insufficiency: Secondary | ICD-10-CM

## 2017-08-06 LAB — POCT INR: INR: 3.4 — AB (ref 2.0–3.0)

## 2017-08-06 NOTE — Patient Instructions (Signed)
Description   Skip today's dosage of Coumadin, then start taking 1 tablet daily except 1/2 tablet on Sundays, Wednesdays, and Fridays. Recheck INR in 10 days. Call with any new medications or procedures [facility] [phone] Main [phone]

## 2017-08-16 ENCOUNTER — Ambulatory Visit (INDEPENDENT_AMBULATORY_CARE_PROVIDER_SITE_OTHER): Payer: Self-pay | Admitting: *Deleted

## 2017-08-16 DIAGNOSIS — Z5181 Encounter for therapeutic drug level monitoring: Secondary | ICD-10-CM

## 2017-08-16 DIAGNOSIS — D6862 Lupus anticoagulant syndrome: Secondary | ICD-10-CM

## 2017-08-16 DIAGNOSIS — I340 Nonrheumatic mitral (valve) insufficiency: Secondary | ICD-10-CM

## 2017-08-16 DIAGNOSIS — Z9889 Other specified postprocedural states: Secondary | ICD-10-CM

## 2017-08-16 LAB — POCT INR: INR: 2.4 (ref 2.0–3.0)

## 2017-08-16 NOTE — Patient Instructions (Signed)
Description   Continue  taking 1 tablet daily except 1/2 tablet on Sundays, Wednesdays, and Fridays. Recheck INR in 11 days. Call with any new medications or procedures [facility] [phone] Main [phone]

## 2017-08-18 ENCOUNTER — Ambulatory Visit (HOSPITAL_COMMUNITY)
Admission: RE | Admit: 2017-08-18 | Discharge: 2017-08-18 | Disposition: A | Discharge status: Home / Self Care | Payer: Medicaid Other | Source: Ambulatory Visit | Attending: Cardiology | Admitting: Cardiology

## 2017-08-18 ENCOUNTER — Other Ambulatory Visit: Payer: Self-pay

## 2017-08-18 VITALS — BP 112/78 | HR 102 | Wt 252.2 lb

## 2017-08-18 DIAGNOSIS — Z86711 Personal history of pulmonary embolism: Secondary | ICD-10-CM | POA: Insufficient documentation

## 2017-08-18 DIAGNOSIS — E785 Hyperlipidemia, unspecified: Secondary | ICD-10-CM | POA: Insufficient documentation

## 2017-08-18 DIAGNOSIS — I25110 Atherosclerotic heart disease of native coronary artery with unstable angina pectoris: Secondary | ICD-10-CM

## 2017-08-18 DIAGNOSIS — R0789 Other chest pain: Secondary | ICD-10-CM | POA: Insufficient documentation

## 2017-08-18 DIAGNOSIS — R000 Tachycardia, unspecified: Secondary | ICD-10-CM | POA: Insufficient documentation

## 2017-08-18 DIAGNOSIS — Z79899 Other long term (current) drug therapy: Secondary | ICD-10-CM | POA: Insufficient documentation

## 2017-08-18 DIAGNOSIS — Z9889 Other specified postprocedural states: Secondary | ICD-10-CM

## 2017-08-18 DIAGNOSIS — Z87891 Personal history of nicotine dependence: Secondary | ICD-10-CM | POA: Insufficient documentation

## 2017-08-18 DIAGNOSIS — Z7982 Long term (current) use of aspirin: Secondary | ICD-10-CM | POA: Insufficient documentation

## 2017-08-18 DIAGNOSIS — Z8249 Family history of ischemic heart disease and other diseases of the circulatory system: Secondary | ICD-10-CM | POA: Insufficient documentation

## 2017-08-18 DIAGNOSIS — I2510 Atherosclerotic heart disease of native coronary artery without angina pectoris: Secondary | ICD-10-CM | POA: Insufficient documentation

## 2017-08-18 DIAGNOSIS — I340 Nonrheumatic mitral (valve) insufficiency: Secondary | ICD-10-CM | POA: Insufficient documentation

## 2017-08-18 DIAGNOSIS — Z7901 Long term (current) use of anticoagulants: Secondary | ICD-10-CM | POA: Insufficient documentation

## 2017-08-18 DIAGNOSIS — I5022 Chronic systolic (congestive) heart failure: Secondary | ICD-10-CM | POA: Insufficient documentation

## 2017-08-18 DIAGNOSIS — I252 Old myocardial infarction: Secondary | ICD-10-CM | POA: Insufficient documentation

## 2017-08-18 DIAGNOSIS — I255 Ischemic cardiomyopathy: Secondary | ICD-10-CM | POA: Insufficient documentation

## 2017-08-18 LAB — BASIC METABOLIC PANEL
ANION GAP: 6 (ref 5–15)
BUN: 11 mg/dL (ref 6–20)
CO2: 26 mmol/L (ref 22–32)
Calcium: 8.9 mg/dL (ref 8.9–10.3)
Chloride: 108 mmol/L (ref 98–111)
Creatinine, Ser: 0.86 mg/dL (ref 0.61–1.24)
GFR calc Af Amer: >60 mL/min (ref >60)
GFR calc non Af Amer: >60 mL/min (ref >60)
GLUCOSE: 113 mg/dL — AB (ref 70–99)
Potassium: 3.9 mmol/L (ref 3.5–5.1)
Sodium: 140 mmol/L (ref 135–145)

## 2017-08-18 LAB — LIPID PANEL
Cholesterol: 110 mg/dL (ref 0–200)
HDL: 31 mg/dL — AB (ref >40)
LDL CALC: 51 mg/dL (ref 0–99)
TRIGLYCERIDES: 140 mg/dL (ref <150)
Total CHOL/HDL Ratio: 3.5 RATIO
VLDL: 28 mg/dL (ref 0–40)

## 2017-08-18 LAB — DIGOXIN LEVEL: Digoxin Level: 0.4 ng/mL — ABNORMAL LOW (ref 0.8–2.0)

## 2017-08-18 MED ORDER — ATORVASTATIN CALCIUM 80 MG PO TABS: 80.0000 mg | ORAL_TABLET | Freq: Every day | ORAL | 2 refills | Status: DC

## 2017-08-18 MED ORDER — CARVEDILOL 3.125 MG PO TABS: 3.1250 mg | ORAL_TABLET | Freq: Two times a day (BID) | ORAL | 3 refills | Status: DC

## 2017-08-18 MED ORDER — SPIRONOLACTONE 25 MG PO TABS: 12.5000 mg | ORAL_TABLET | Freq: Every day | ORAL | 3 refills | Status: DC

## 2017-08-18 MED ORDER — FUROSEMIDE 20 MG PO TABS: 20.0000 mg | ORAL_TABLET | Freq: Every day | ORAL | 3 refills | Status: DC

## 2017-08-18 MED ORDER — DIGOXIN 250 MCG PO TABS: 0.2500 mg | ORAL_TABLET | Freq: Every day | ORAL | 3 refills | Status: DC

## 2017-08-18 MED ORDER — LOSARTAN POTASSIUM 25 MG PO TABS: 12.5000 mg | ORAL_TABLET | Freq: Every evening | ORAL | 3 refills | Status: DC

## 2017-08-18 MED FILL — DIGOXIN 250 MCG TAB: 250 | 30 days supply | Qty: 30 | Fill #0

## 2017-08-18 MED FILL — ATORVASTATIN 80 MG TABLET: 80 | 30 days supply | Qty: 30 | Fill #0

## 2017-08-18 MED FILL — CARVEDILOL 3.125 MG TABLET: 3.125 | 30 days supply | Qty: 60 | Fill #0

## 2017-08-18 MED FILL — LOSARTAN POTASSIUM 25 MG TA: 25 | 30 days supply | Qty: 15 | Fill #0

## 2017-08-18 MED FILL — SPIRONOLACTONE 25 MG TABLET: 25 | 30 days supply | Qty: 15 | Fill #0

## 2017-08-18 MED FILL — FUROSEMIDE 20 MG TABS: 20 | 30 days supply | Qty: 30 | Fill #0

## 2017-08-18 NOTE — Patient Instructions (Addendum)
Labs today (will call for abnormal results, otherwise no news is good news)  STOP taking Aspirin   START taking Spironolactone 12.5 mg (0.5 Tablet) Once Daily.  Take every AM.   START Carvedilol 3.125 mg (1 Tablet) Twice Daily  START Lasix 20 mg (1 Tablet) Once Daily  Follow up with [facility] in 2 weeks.  Echocardiogram and Follow up as scheduled with Dr. Shirlee LatchMcLean.

## 2017-08-18 NOTE — Progress Notes (Signed)
CSW met with patient and his wife. Patient has a pending disability and medicaid case. Patient requests assistance with follow up and will call CSW later today and provide contact numbers. CSW will follow up tomorrow with calls to [employer]. Raquel Sarna, [place], [place]

## 2017-08-19 ENCOUNTER — Telehealth: Payer: Self-pay | Admitting: Licensed Clinical Social Worker

## 2017-08-19 NOTE — Telephone Encounter (Signed)
CSW contacted [employer] per patient request. [facility] @ [phone]. Patient's case # [id number] for [employer] case. CSW will await return call. Lasandra BeechJackie Keishia Ground, LCSW, CCSW-MCS [phone]

## 2017-08-19 NOTE — Progress Notes (Signed)
PCP: None  HF Cardiology: Dr Shirlee Latch  CT Surgery: Dr Cornelius Moras  HPI: Joshua May is a 42 y.o. [name] male with a history of CAD dating back to 2007 when he had a PCI in [place]. In 2010 he presented with a STEMI and had an RCA DES placed. He followed up for a year but then didn't present again till Oct 2017 when he presented with a CFX infarct. Cath revealed 50% ISR of the RCA, total mCFX and 85% OM3. He had CFX PCI with DES and OM3 POBA. His EF then was 50-55%.  Admitted 02/27/17 with increased dyspnea and chest pain. CTA confirmed PE. ECHO completed and showed reduced EF 20-25%. Underwent LHC as noted below. [place] no evidence DVTs. CT surgery consulted for severe MR. Plan to optimize HF medications.Set up TEE as an outpatient. Discharge weight 222 pounds.   Admitted 06/2017 with scheduled MV repair. S/P Minimally invasive mitral valve repair on 6/19. ECHO 07/12/17 post surgery, no MR was noted but EF remained 25-30%. Tachycardic at the time of discharge. Discharge weight 257 pounds.   He returns today for followup of CHF and CAD.  He has some pulling pain in his chest with deep breathing.  No exertional chest pain.  He is taking his warfarin as prescribed. He remains quite symptomatic, dyspneic with stairs and with walking about 20 feet (better if he walks slow).  He has bendopnea. No lightheadedness or syncope.   ECG (personally reviewed): sinus tachy at 100, inferior MI, anterolateral MI  Labs (7/19): K 4.5, creatinine 0.95  PMH: 1. CAD: Initial PCI in 2007 in [place], inferior MI.   - Inferior STEMI 2010 with RCA PCI.  - CFx infarct in 2017 with LCx DES and OM3 POBA.  - LHC (2/19): Nonobstructive CAD.  2. 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  - TEE (4/19): LVEF 35-40%, Severe MR with PISA ERO 0.43 cm^2. Suspect infarct related MR with tethering of the posterior leaflet.  - Minimally invasive MV repair in 6/19.   3. Chronic systolic CHF: Ischemic cardiomyopathy.  - Echo (2/19): EF 25-30%, diffuse HK  with inferolateral AK, mildly decreased RV systolic function with D-shaped septum, moderate-severe infarct-related MR.  - Echo (6/19): EF 25-30%, apical and inferolateral hypokinesis, MV repair with mean gradient 9 mmHg with no MR, mildly decreased RV systolic function.  4. Hyperlipidemia 5. PE: 2/19.   Review of systems complete and found to be negative unless listed in HPI.    Social History   Socioeconomic History  . Marital status: Married    Spouse name: Not on file  . Number of children: Not on file  . Years of education: Not on file  . Highest education level: Not on file  Occupational History  . Not on file  Social Needs  . Financial resource strain: Not on file  . Food insecurity:    Worry: Not on file    Inability: Not on file  . Transportation needs:    Medical: Not on file    Non-medical: Not on file  Tobacco Use  . Smoking status: Former Smoker    Packs/day: 0.50    Years: 17.00    Pack years: 8.50    Types: Cigarettes  . Smokeless tobacco: Never Used  . Tobacco comment: quit since heart attack in 10/2015  Substance and Sexual Activity  . Alcohol use: Not Currently  . Drug use: Not Currently    Types: Marijuana  . Sexual activity: Not on file  Lifestyle  .  Physical activity:    Days per week: Not on file    Minutes per session: Not on file  . Stress: Not on file  Relationships  . Social connections:    Talks on phone: Not on file    Gets together: Not on file    Attends religious service: Not on file    Active member of club or organization: Not on file    Attends meetings of clubs or organizations: Not on file    Relationship status: Not on file  . Intimate partner violence:    Fear of current or ex partner: Not on file    Emotionally abused: Not on file    Physically abused: Not on file    Forced sexual activity: Not on file  Other Topics Concern  . Not on file  Social History Narrative   The patient is married with two children.   Family  History  Problem Relation Age of Onset  . Other Sister [id number]       THE PATIENT REPORTS A SISTER WITH A MYOCARDIAL INFARCTION IN HER 30s.(SHE IS NOW S/P CABG)  . Heart attack Father   . Deep vein thrombosis Father     Current Outpatient Medications  Medication Sig Dispense Refill  . atorvastatin (LIPITOR) 80 MG tablet Take 1 tablet (80 mg total) by mouth daily. 30 tablet 2  . digoxin (LANOXIN) 0.25 MG tablet Take 1 tablet (0.25 mg total) by mouth daily. 30 tablet 3  . ferrous sulfate 325 (65 FE) MG tablet Take 1 tablet (325 mg total) by mouth daily with breakfast. For one month then stop. 30 tablet 3  . folic acid (FOLVITE) 1 MG tablet Take 1 tablet (1 mg total) by mouth daily. For one month then stop.    Marland Kitchen. HYDROcodone-acetaminophen (NORCO) 10-325 MG tablet Take one tablet by mouth every 4-6 hours PRN severe pain. 30 tablet 0  . levofloxacin (LEVAQUIN) 500 MG tablet Take 1 tablet (500 mg total) by mouth daily after breakfast. 7 tablet 0  . losartan (COZAAR) 25 MG tablet Take 0.5 tablets (12.5 mg total) by mouth every evening. 15 tablet 3  . warfarin (COUMADIN) 7.5 MG tablet Take 1 tablet (7.5 mg total) by mouth daily at 6 PM. Or as directed 30 tablet 1  . carvedilol (COREG) 3.125 MG tablet Take 1 tablet (3.125 mg total) by mouth 2 (two) times daily. 60 tablet 3  . furosemide (LASIX) 20 MG tablet Take 1 tablet (20 mg total) by mouth daily. 30 tablet 3  . Meclizine HCl 25 MG CHEW Chew 1 tablet (25 mg total) by mouth daily as needed. (Patient not taking: Reported on 07/21/2017) 30 tablet 0  . spironolactone (ALDACTONE) 25 MG tablet Take 0.5 tablets (12.5 mg total) by mouth daily. Take in the AM 15 tablet 3   No current facility-administered medications for this encounter.    Vitals:   08/18/17 1148  BP: 112/78  Pulse: (!) 102  SpO2: 99%  Weight: 252 lb 3.2 oz (114.4 kg)    Wt Readings from Last 3 Encounters:  08/18/17 252 lb 3.2 oz (114.4 kg)  07/26/17 249 lb 9.6 oz (113.2 kg)  07/21/17  253 lb (114.8 kg)    PHYSICAL EXAM: General: NAD Neck: JVP 8 cm with HJR, no thyromegaly or thyroid nodule.  Lungs: Clear to auscultation bilaterally with normal respiratory effort. CV: Nondisplaced PMI.  Heart regular S1/S2, no S3/S4, no murmur.  No peripheral edema.  No carotid bruit.  Normal  pedal pulses.  Abdomen: Soft, nontender, no hepatosplenomegaly, no distention.  Skin: Intact without lesions or rashes.  Neurologic: Alert and oriented x 3.  Psych: Normal affect. Extremities: No clubbing or cyanosis.  HEENT: Normal.   ASSESSMENT & PLAN: 1. Chronic systolic CHF: Ischemic cardiomyopathy. Echo in 6/19 with EF 25-30%.  He has NYHA class IIIb symptoms.  On exam, possible mild volume overload but symptoms seem out of proportion to degree of CHF on exam.  - Continue losartan 12.5 daily and digoxin 0.25.  Check digoxin level today.  - Add spironolactone 12.5 daily.  - Add Coreg 3.125 mg bid.  - Add Lasix 20 mg daily.  - BMET today and again in 10 days.  - Repeat echo in 9/19 (3 months post-MV repair). If EF remains low, will need ICD. Needs to get Medicaid coverage prior to this. Narrow QRS, not candidate for CRT.  - He will eventually need CPX and possible RHC. I am concerned about his degree of symptomatology.  2. CAD: Stents in LCx and RCA. LHC 02/2017 showed nonobstructive disease. Atypical chest pain likely due to scar tissue post-surgery.  - Continue atorvastatin, check lipids today. - He is on warfarin and ASA.  Given stable CAD on warfarin, stop ASA.  3. PE: In 2019, no definite trigger. Father with h/o VTE.  He should likely have long-term anticoagulation.   - He is on warfarin.   4. Mitral regurgitation: Ischemic MR.  S/p minimally invasive MV repair in 6/19. Post-op echo 6/19 showed no MR but mean gradient 9 mmHg across MV.  - Will repeat echo in 9/19, hopefully MV gradient will be lower.  5. Former smoker: Encouraged continued abstinence.   I am going to have our social  worker help him today with Medicaid and disability planning.   Followup with NP/PA in 2 wks, see me in 6 weeks with echo.   Marca Ancona 08/19/2017

## 2017-08-27 ENCOUNTER — Ambulatory Visit (INDEPENDENT_AMBULATORY_CARE_PROVIDER_SITE_OTHER): Payer: Self-pay

## 2017-08-27 DIAGNOSIS — Z5181 Encounter for therapeutic drug level monitoring: Secondary | ICD-10-CM

## 2017-08-27 DIAGNOSIS — D6862 Lupus anticoagulant syndrome: Secondary | ICD-10-CM

## 2017-08-27 DIAGNOSIS — I340 Nonrheumatic mitral (valve) insufficiency: Secondary | ICD-10-CM

## 2017-08-27 DIAGNOSIS — Z9889 Other specified postprocedural states: Secondary | ICD-10-CM

## 2017-08-27 LAB — POCT INR: INR: 3.4 — AB (ref 2.0–3.0)

## 2017-08-27 NOTE — Patient Instructions (Signed)
Description   Skip today's dosage of Coumadin, then start taking 1/2 tablet daily except 1 tablet on Tuesdays, Thursdays, and Saturdays.  Recheck INR in 10 days. Call with any new medications or procedures [facility] [phone] Main [phone]

## 2017-08-31 NOTE — Progress Notes (Signed)
PCP: None  HF Cardiology: Dr Shirlee LatchMcLean  CT Surgery: Dr Cornelius Moraswen  HPI: Joshua May is a 42 y.o. Joshua May male with a history of CAD dating back to 2007 when he had a PCI in [place]. In 2010 he presented with a STEMI and had an RCA DES placed. He followed up for a year but then didn't present again till Oct 2017 when he presented with a CFX infarct. Cath revealed 50% ISR of the RCA, total mCFX and 85% OM3. He had CFX PCI with DES and OM3 POBA. His EF then was 50-55%.  Admitted 02/27/17 with increased dyspnea and chest pain. CTA confirmed PE. ECHO completed and showed reduced EF 20-25%. Underwent LHC as noted below. US no evidence DVTs. CT surgery consulted for severe MR. Plan to optimize HF medications.Set up TEE as an outpatient. Discharge weight 222 pounds.   Admitted 06/2017 with scheduled MV repair. S/P Minimally invasive mitral valve repair on 6/19. ECHO 07/12/17 post surgery, no MR was noted but EF remained 25-30%. Tachycardic at the time of discharge. Discharge weight 257 pounds.   He returns today for HF follow up. Last visit, he was started on coreg, spiro, and lasix. Overall feels about the same. He is SOB with minimal exertion, sometimes ADLS. Sleeps on 2 pillows at baseline. No edema or PND. + bendopnea. Denies further CP. No dizziness. Appetite is good. He feels sluggish. He is concerned about painful spot in left groin that's been present since MV surgery and seems to be getting worse. Denies fever/chills/drainage. BP always difficult to get, had to use doppler today. Taking all medications. Limits fluid and salt. Weights 248 to 253 lbs at home.   ECG (personally reviewed): sinus tachy at 100, inferior MI, anterolateral MI  Labs (7/19): K 4.5, creatinine 0.95  PMH: 1. CAD: Initial PCI in 2007 in [place], inferior MI.   - Inferior STEMI 2010 with RCA PCI.  - CFx infarct in 2017 with LCx DES and OM3 POBA.  - LHC (2/19): Nonobstructive CAD.  2. 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  - TEE (4/19):  LVEF 35-40%, Severe MR with PISA ERO 0.43 cm^2. Suspect infarct related MR with tethering of the posterior leaflet.  - Minimally invasive MV repair in 6/19.   3. Chronic systolic CHF: Ischemic cardiomyopathy.  - Echo (2/19): EF 25-30%, diffuse HK with inferolateral AK, mildly decreased RV systolic function with D-shaped septum, moderate-severe infarct-related MR.  - Echo (6/19): EF 25-30%, apical and inferolateral hypokinesis, MV repair with mean gradient 9 mmHg with no MR, mildly decreased RV systolic function.  4. Hyperlipidemia 5. PE: 2/19.   Review of systems complete and found to be negative unless listed in HPI.   Social History   Socioeconomic History  . Marital status: Married    Spouse name: Not on file  . Number of children: Not on file  . Years of education: Not on file  . Highest education level: Not on file  Occupational History  . Not on file  Social Needs  . Financial resource strain: Not on file  . Food insecurity:    Worry: Not on file    Inability: Not on file  . Transportation needs:    Medical: Not on file    Non-medical: Not on file  Tobacco Use  . Smoking status: Former Smoker    Packs/day: 0.50    Years: 17.00    Pack years: 8.50    Types: Cigarettes  . Smokeless tobacco: Never Used  . Tobacco comment:  quit since heart attack in 10/2015  Substance and Sexual Activity  . Alcohol use: Not Currently  . Drug use: Not Currently    Types: Marijuana  . Sexual activity: Not on file  Lifestyle  . Physical activity:    Days per week: Not on file    Minutes per session: Not on file  . Stress: Not on file  Relationships  . Social connections:    Talks on phone: Not on file    Gets together: Not on file    Attends religious service: Not on file    Active member of club or organization: Not on file    Attends meetings of clubs or organizations: Not on file    Relationship status: Not on file  . Intimate partner violence:    Fear of current or ex  partner: Not on file    Emotionally abused: Not on file    Physically abused: Not on file    Forced sexual activity: Not on file  Other Topics Concern  . Not on file  Social History Narrative   The patient is married with two children.   Family History  Problem Relation Age of Onset  . Other Sister [id number]       THE PATIENT REPORTS A SISTER WITH A MYOCARDIAL INFARCTION IN HER 30s.(SHE IS NOW S/P CABG)  . Heart attack Father   . Deep vein thrombosis Father     Current Outpatient Medications  Medication Sig Dispense Refill  . atorvastatin (LIPITOR) 80 MG tablet Take 1 tablet (80 mg total) by mouth daily. 30 tablet 2  . carvedilol (COREG) 3.125 MG tablet Take 1 tablet (3.125 mg total) by mouth 2 (two) times daily. 60 tablet 3  . digoxin (LANOXIN) 0.25 MG tablet Take 1 tablet (0.25 mg total) by mouth daily. 30 tablet 3  . ferrous sulfate 325 (65 FE) MG tablet Take 1 tablet (325 mg total) by mouth daily with breakfast. For one month then stop. 30 tablet 3  . folic acid (FOLVITE) 1 MG tablet Take 1 tablet (1 mg total) by mouth daily. For one month then stop.    . furosemide (LASIX) 20 MG tablet Take 1 tablet (20 mg total) by mouth daily. 30 tablet 3  . losartan (COZAAR) 25 MG tablet Take 0.5 tablets (12.5 mg total) by mouth every evening. 15 tablet 3  . spironolactone (ALDACTONE) 25 MG tablet Take 0.5 tablets (12.5 mg total) by mouth daily. Take in the AM 15 tablet 3  . warfarin (COUMADIN) 7.5 MG tablet Take 1 tablet (7.5 mg total) by mouth daily at 6 PM. Or as directed 30 tablet 1  . Meclizine HCl 25 MG CHEW Chew 1 tablet (25 mg total) by mouth daily as needed. (Patient not taking: Reported on 07/21/2017) 30 tablet 0   No current facility-administered medications for this encounter.    Vitals:   09/01/17 0927  BP: (!) 108/1  Pulse: 84  SpO2: 98%  Weight: 115.9 kg (255 lb 9.6 oz)    Wt Readings from Last 3 Encounters:  09/01/17 115.9 kg (255 lb 9.6 oz)  08/18/17 114.4 kg (252 lb 3.2 oz)    07/26/17 113.2 kg (249 lb 9.6 oz)    PHYSICAL EXAM: General: Fatigued appearing. No resp difficulty. HEENT: Normal Neck: Supple. JVP flat. Carotids 2+ bilat; no bruits. No thyromegaly or nodule noted. Cor: PMI nondisplaced. RRR, No M/G/R noted Lungs: CTAB, normal effort. Abdomen: Soft, non-tender, non-distended, no HSM. No bruits or masses. +BS  Extremities: No cyanosis, clubbing, or rash. R and LLE no edema. Hard, rope-like area in left groin. Nonpulsatile.  Neuro: Alert & orientedx3, cranial nerves grossly intact. moves all 4 extremities w/o difficulty. Affect pleasant   ASSESSMENT & PLAN: 1. Chronic systolic CHF: Ischemic cardiomyopathy. Echo in 6/19 with EF 25-30%.  He has ongoing NYHA class IIIb symptoms.   - Volume status stable despite weight gain.  - Continue losartan 12.5 daily - Continue digoxin 0.25. Dig level 0.4 07/2017  - Increase spiro to 25 mg daily. BMET today and in 7-10 days.  - Continue Coreg 3.125 mg bid.  - Continue Lasix 20 mg daily.  - Schedule CPX and RHC. Discussed with Dr Shirlee Latch.  - Repeat echo in 9/19 (3 months post-MV repair). If EF remains low, will need ICD. Needs to get Medicaid coverage prior to this. Will have Annice Pih, CSW meet with him today. Narrow QRS, not candidate for CRT.  2. CAD: Stents in LCx and RCA. LHC 02/2017 showed nonobstructive disease. Atypical chest pain likely due to scar tissue post-surgery.  - Continue atorvastatin. LDL 51 07/2017.  - Continue warfarin. Does not also need ASA.  3. PE: In 2019, no definite trigger. Father with h/o VTE.  He should likely have long-term anticoagulation.   - Continue warfarin.  Denies bleeding.  4. Mitral regurgitation: Ischemic MR.  S/p minimally invasive MV repair in 6/19. Post-op echo 6/19 showed no MR but mean gradient 9 mmHg across MV.  - Repeat echo scheduled 9/16 to reevaluate MV gradient  5. Former smoker: Encouraged continued abstinence.  No tobacco >1 year.   6. Left groin pain - Will get [place]  to r/o pseudoaneurysm. This was one of the access spots for MV surgery.  BMET today and in 7-10 days.  Increase spiro to 25 mg daily Set up RHC and CPX prior to follow up with Dr Shirlee Latch if possible.  Ultrasound left groin (r/o ?psuedoaneurysm) Keep follow up with Dr Shirlee Latch in 4 weeks with echo   Alford Highland, NP 09/01/2017  Greater than 50% of the 25 minute visit was spent in counseling/coordination of care regarding disease state education, salt/fluid restriction, sliding scale diuretics, and medication compliance.

## 2017-08-31 NOTE — H&P (View-Only) (Signed)
PCP: None  HF Cardiology: Dr McLean  CT Surgery: Dr Owen  HPI: Joshua May is a 41 y.o. maleAA male with a history of CAD dating back to 2007 when he had a PCI in [place]. In 2010 he presented with a STEMI and had an RCA DES placed. He followed up for a year but then didn't present again till Oct 2017 when he presented with a CFX infarct. Cath revealed 50% ISR of the RCA, total mCFX and 85% OM3. He had CFX PCI with DES and OM3 POBA. His EF then was 50-55%.  Admitted 02/27/17 with increased dyspnea and chest pain. CTA confirmed PE. ECHO completed and showed reduced EF 20-25%. Underwent LHC as noted below. US no evidence DVTs. CT surgery consulted for severe MR. Plan to optimize HF medications.Set up TEE as an outpatient. Discharge weight 222 pounds.   Admitted 06/2017 with scheduled MV repair. S/P Minimally invasive mitral valve repair on 6/19. ECHO 07/12/17 post surgery, no MR was noted but EF remained 25-30%. Tachycardic at the time of discharge. Discharge weight 257 pounds.   He returns today for HF follow up. Last visit, he was started on coreg, spiro, and lasix. Overall feels about the same. He is SOB with minimal exertion, sometimes ADLS. Sleeps on 2 pillows at baseline. No edema or PND. + bendopnea. Denies further CP. No dizziness. Appetite is good. He feels sluggish. He is concerned about painful spot in left groin that's been present since MV surgery and seems to be getting worse. Denies fever/chills/drainage. BP always difficult to get, had to use doppler today. Taking all medications. Limits fluid and salt. Weights 248 to 253 lbs at home.   ECG (personally reviewed): sinus tachy at 100, inferior MI, anterolateral MI  Labs (7/19): K 4.5, creatinine 0.95  PMH: 1. CAD: Initial PCI in 2007 in [place], inferior MI.   - Inferior STEMI 2010 with RCA PCI.  - CFx infarct in 2017 with LCx DES and OM3 POBA.  - LHC (2/19): Nonobstructive CAD.  2. 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  - TEE (4/19):  LVEF 35-40%, Severe MR with PISA ERO 0.43 cm^2. Suspect infarct related MR with tethering of the posterior leaflet.  - Minimally invasive MV repair in 6/19.   3. Chronic systolic CHF: Ischemic cardiomyopathy.  - Echo (2/19): EF 25-30%, diffuse HK with inferolateral AK, mildly decreased RV systolic function with D-shaped septum, moderate-severe infarct-related MR.  - Echo (6/19): EF 25-30%, apical and inferolateral hypokinesis, MV repair with mean gradient 9 mmHg with no MR, mildly decreased RV systolic function.  4. Hyperlipidemia 5. PE: 2/19.   Review of systems complete and found to be negative unless listed in HPI.   Social History   Socioeconomic History  . Marital status: Married    Spouse name: Not on file  . Number of children: Not on file  . Years of education: Not on file  . Highest education level: Not on file  Occupational History  . Not on file  Social Needs  . Financial resource strain: Not on file  . Food insecurity:    Worry: Not on file    Inability: Not on file  . Transportation needs:    Medical: Not on file    Non-medical: Not on file  Tobacco Use  . Smoking status: Former Smoker    Packs/day: 0.50    Years: 17.00    Pack years: 8.50    Types: Cigarettes  . Smokeless tobacco: Never Used  . Tobacco comment:   quit since heart attack in 10/2015  Substance and Sexual Activity  . Alcohol use: Not Currently  . Drug use: Not Currently    Types: Marijuana  . Sexual activity: Not on file  Lifestyle  . Physical activity:    Days per week: Not on file    Minutes per session: Not on file  . Stress: Not on file  Relationships  . Social connections:    Talks on phone: Not on file    Gets together: Not on file    Attends religious service: Not on file    Active member of club or organization: Not on file    Attends meetings of clubs or organizations: Not on file    Relationship status: Not on file  . Intimate partner violence:    Fear of current or ex  partner: Not on file    Emotionally abused: Not on file    Physically abused: Not on file    Forced sexual activity: Not on file  Other Topics Concern  . Not on file  Social History Narrative   The patient is married with two children.   Family History  Problem Relation Age of Onset  . Other Sister 30       THE PATIENT REPORTS A SISTER WITH A MYOCARDIAL INFARCTION IN HER 30s.(SHE IS NOW S/P CABG)  . Heart attack Father   . Deep vein thrombosis Father     Current Outpatient Medications  Medication Sig Dispense Refill  . atorvastatin (LIPITOR) 80 MG tablet Take 1 tablet (80 mg total) by mouth daily. 30 tablet 2  . carvedilol (COREG) 3.125 MG tablet Take 1 tablet (3.125 mg total) by mouth 2 (two) times daily. 60 tablet 3  . digoxin (LANOXIN) 0.25 MG tablet Take 1 tablet (0.25 mg total) by mouth daily. 30 tablet 3  . ferrous sulfate 325 (65 FE) MG tablet Take 1 tablet (325 mg total) by mouth daily with breakfast. For one month then stop. 30 tablet 3  . folic acid (FOLVITE) 1 MG tablet Take 1 tablet (1 mg total) by mouth daily. For one month then stop.    . furosemide (LASIX) 20 MG tablet Take 1 tablet (20 mg total) by mouth daily. 30 tablet 3  . losartan (COZAAR) 25 MG tablet Take 0.5 tablets (12.5 mg total) by mouth every evening. 15 tablet 3  . spironolactone (ALDACTONE) 25 MG tablet Take 0.5 tablets (12.5 mg total) by mouth daily. Take in the AM 15 tablet 3  . warfarin (COUMADIN) 7.5 MG tablet Take 1 tablet (7.5 mg total) by mouth daily at 6 PM. Or as directed 30 tablet 1  . Meclizine HCl 25 MG CHEW Chew 1 tablet (25 mg total) by mouth daily as needed. (Patient not taking: Reported on 07/21/2017) 30 tablet 0   No current facility-administered medications for this encounter.    Vitals:   09/01/17 0927  BP: (!) 108/1  Pulse: 84  SpO2: 98%  Weight: 115.9 kg (255 lb 9.6 oz)    Wt Readings from Last 3 Encounters:  09/01/17 115.9 kg (255 lb 9.6 oz)  08/18/17 114.4 kg (252 lb 3.2 oz)    07/26/17 113.2 kg (249 lb 9.6 oz)    PHYSICAL EXAM: General: Fatigued appearing. No resp difficulty. HEENT: Normal Neck: Supple. JVP flat. Carotids 2+ bilat; no bruits. No thyromegaly or nodule noted. Cor: PMI nondisplaced. RRR, No M/G/R noted Lungs: CTAB, normal effort. Abdomen: Soft, non-tender, non-distended, no HSM. No bruits or masses. +BS    Extremities: No cyanosis, clubbing, or rash. R and LLE no edema. Hard, rope-like area in left groin. Nonpulsatile.  Neuro: Alert & orientedx3, cranial nerves grossly intact. moves all 4 extremities w/o difficulty. Affect pleasant   ASSESSMENT & PLAN: 1. Chronic systolic CHF: Ischemic cardiomyopathy. Echo in 6/19 with EF 25-30%.  He has ongoing NYHA class IIIb symptoms.   - Volume status stable despite weight gain.  - Continue losartan 12.5 daily - Continue digoxin 0.25. Dig level 0.4 07/2017  - Increase spiro to 25 mg daily. BMET today and in 7-10 days.  - Continue Coreg 3.125 mg bid.  - Continue Lasix 20 mg daily.  - Schedule CPX and RHC. Discussed with Dr McLean.  - Repeat echo in 9/19 (3 months post-MV repair). If EF remains low, will need ICD. Needs to get Medicaid coverage prior to this. Will have Jackie, CSW meet with him today. Narrow QRS, not candidate for CRT.  2. CAD: Stents in LCx and RCA. LHC 02/2017 showed nonobstructive disease. Atypical chest pain likely due to scar tissue post-surgery.  - Continue atorvastatin. LDL 51 07/2017.  - Continue warfarin. Does not also need ASA.  3. PE: In 2019, no definite trigger. Father with h/o VTE.  He should likely have long-term anticoagulation.   - Continue warfarin.  Denies bleeding.  4. Mitral regurgitation: Ischemic MR.  S/p minimally invasive MV repair in 6/19. Post-op echo 6/19 showed no MR but mean gradient 9 mmHg across MV.  - Repeat echo scheduled 9/16 to reevaluate MV gradient  5. Former smoker: Encouraged continued abstinence.  No tobacco >1 year.   6. Left groin pain - Will get US  to r/o pseudoaneurysm. This was one of the access spots for MV surgery.  BMET today and in 7-10 days.  Increase spiro to 25 mg daily Set up RHC and CPX prior to follow up with Dr McLean if possible.  Ultrasound left groin (r/o ?psuedoaneurysm) Keep follow up with Dr McLean in 4 weeks with echo   Ashley M Smith, NP 09/01/2017  Greater than 50% of the 25 minute visit was spent in counseling/coordination of care regarding disease state education, salt/fluid restriction, sliding scale diuretics, and medication compliance.  

## 2017-09-01 ENCOUNTER — Encounter: Payer: Self-pay | Admitting: *Deleted

## 2017-09-01 ENCOUNTER — Encounter (HOSPITAL_COMMUNITY): Payer: Self-pay | Admitting: Cardiology

## 2017-09-01 ENCOUNTER — Ambulatory Visit (HOSPITAL_COMMUNITY)
Admission: RE | Admit: 2017-09-01 | Discharge: 2017-09-01 | Disposition: A | Discharge status: Home / Self Care | Payer: Self-pay | Source: Ambulatory Visit | Attending: Cardiology | Admitting: Cardiology

## 2017-09-01 ENCOUNTER — Encounter (HOSPITAL_COMMUNITY): Payer: Self-pay

## 2017-09-01 VITALS — BP 108/1 | HR 84 | Wt 255.6 lb

## 2017-09-01 DIAGNOSIS — I255 Ischemic cardiomyopathy: Secondary | ICD-10-CM | POA: Insufficient documentation

## 2017-09-01 DIAGNOSIS — I340 Nonrheumatic mitral (valve) insufficiency: Secondary | ICD-10-CM

## 2017-09-01 DIAGNOSIS — Z8249 Family history of ischemic heart disease and other diseases of the circulatory system: Secondary | ICD-10-CM | POA: Insufficient documentation

## 2017-09-01 DIAGNOSIS — I729 Aneurysm of unspecified site: Secondary | ICD-10-CM

## 2017-09-01 DIAGNOSIS — I252 Old myocardial infarction: Secondary | ICD-10-CM | POA: Insufficient documentation

## 2017-09-01 DIAGNOSIS — I9789 Other postprocedural complications and disorders of the circulatory system, not elsewhere classified: Secondary | ICD-10-CM

## 2017-09-01 DIAGNOSIS — I2510 Atherosclerotic heart disease of native coronary artery without angina pectoris: Secondary | ICD-10-CM | POA: Insufficient documentation

## 2017-09-01 DIAGNOSIS — T81718A Complication of other artery following a procedure, not elsewhere classified, initial encounter: Secondary | ICD-10-CM

## 2017-09-01 DIAGNOSIS — Z79899 Other long term (current) drug therapy: Secondary | ICD-10-CM | POA: Insufficient documentation

## 2017-09-01 DIAGNOSIS — I5022 Chronic systolic (congestive) heart failure: Secondary | ICD-10-CM | POA: Insufficient documentation

## 2017-09-01 DIAGNOSIS — R1032 Left lower quadrant pain: Secondary | ICD-10-CM | POA: Insufficient documentation

## 2017-09-01 DIAGNOSIS — Z006 Encounter for examination for normal comparison and control in clinical research program: Secondary | ICD-10-CM

## 2017-09-01 DIAGNOSIS — Z87891 Personal history of nicotine dependence: Secondary | ICD-10-CM | POA: Insufficient documentation

## 2017-09-01 DIAGNOSIS — E785 Hyperlipidemia, unspecified: Secondary | ICD-10-CM | POA: Insufficient documentation

## 2017-09-01 DIAGNOSIS — R0789 Other chest pain: Secondary | ICD-10-CM | POA: Insufficient documentation

## 2017-09-01 DIAGNOSIS — Z955 Presence of coronary angioplasty implant and graft: Secondary | ICD-10-CM | POA: Insufficient documentation

## 2017-09-01 DIAGNOSIS — Z7901 Long term (current) use of anticoagulants: Secondary | ICD-10-CM | POA: Insufficient documentation

## 2017-09-01 DIAGNOSIS — I2699 Other pulmonary embolism without acute cor pulmonale: Secondary | ICD-10-CM

## 2017-09-01 LAB — BASIC METABOLIC PANEL
ANION GAP: 7 (ref 5–15)
BUN: 10 mg/dL (ref 6–20)
CHLORIDE: 108 mmol/L (ref 98–111)
CO2: 26 mmol/L (ref 22–32)
Calcium: 8.8 mg/dL — ABNORMAL LOW (ref 8.9–10.3)
Creatinine, Ser: 0.86 mg/dL (ref 0.61–1.24)
GFR calc Af Amer: >60 mL/min (ref >60)
GFR calc non Af Amer: >60 mL/min (ref >60)
Glucose, Bld: 101 mg/dL — ABNORMAL HIGH (ref 70–99)
POTASSIUM: 3.9 mmol/L (ref 3.5–5.1)
SODIUM: 141 mmol/L (ref 135–145)

## 2017-09-01 MED ORDER — SPIRONOLACTONE 25 MG PO TABS: 25.0000 mg | ORAL_TABLET | Freq: Every day | ORAL | 3 refills | Status: DC

## 2017-09-01 MED FILL — SPIRONOLACTONE 25 MG TABLET: 25 | 30 days supply | Qty: 30 | Fill #0

## 2017-09-01 NOTE — Patient Instructions (Addendum)
INCREASE Spironolactone to 25 mg, one tab daily  Your physician has requested that you have a cardiac catheterization. Cardiac catheterization is used to diagnose and/or treat various heart conditions. Doctors may recommend this procedure for a number of different reasons. The most common reason is to evaluate chest pain. Chest pain can be a symptom of coronary artery disease (CAD), and cardiac catheterization can show whether plaque is narrowing or blocking your heart's arteries. This procedure is also used to evaluate the valves, as well as measure the blood flow and oxygen levels in different parts of your heart. For further information please visit [email or web address]. Please follow instruction sheet, as given.  Your physician has recommended that you have a cardiopulmonary stress test (CPX). CPX testing is a non-invasive measurement of heart and lung function. It replaces a traditional treadmill stress test. This type of test provides a tremendous amount of information that relates not only to your present condition but also for future outcomes. This test combines measurements of you ventilation, respiratory gas exchange in the lungs, electrocardiogram (EKG), blood pressure and physical response before, during, and following an exercise protocol.  [facility] been [facility]ology for a groin ultrasound    Labs today We will only contact you if something comes back abnormal or we need to make some changes. Otherwise no news is good news!  Labs needed in 7-10 days  Keep your follow appointment as scheduled with Dr Shirlee LatchMcLean  Do the following things EVERYDAY: 1) Weigh yourself in the morning before breakfast. Write it down and keep it in a log. 2) Take your medicines as prescribed 3) Eat low salt foods-Limit salt (sodium) to 2000 mg per day.  4) Stay as active as you can everyday 5) Limit all fluids for the day to less than 2 liters

## 2017-09-01 NOTE — Progress Notes (Signed)
Spoke with patient about Beat HF study. Pt watched video and asked questions. Gave patient consent to review along with brochure. Pt wants to speak with Shirlee LatchMcLean about it, pt to see Dr Shirlee LatchMcLean in 2 weeks. Will follow up.

## 2017-09-02 ENCOUNTER — Telehealth: Payer: Self-pay | Admitting: Licensed Clinical Social Worker

## 2017-09-02 ENCOUNTER — Other Ambulatory Visit (HOSPITAL_COMMUNITY): Payer: Self-pay

## 2017-09-02 ENCOUNTER — Telehealth: Payer: Self-pay | Admitting: Pharmacist

## 2017-09-02 DIAGNOSIS — I5022 Chronic systolic (congestive) heart failure: Secondary | ICD-10-CM

## 2017-09-02 NOTE — Telephone Encounter (Signed)
-----   Message from [facility]el M Jeffries, [place] sent at 09/01/2017 10:22 AM EDT ----- Hello! This patient is scheduled for a RHC on 09/23/17 with Dr Shirlee LatchMcLean. He would like for you all to review if this patient will need a levonox bridge prior to procedure and if so further advise the patient.   Thanks

## 2017-09-02 NOTE — Telephone Encounter (Signed)
CSW contacted patient to follow up on pending disability case. Patient reports he spoke with [employer] caseworker [name] yesterday and she informed him that they have all needed medical records. They anticipate a decision within the next two weeks. CSW available as needed. Lasandra BeechJackie Manases [place], LCSW, CCSW-MCS [phone]

## 2017-09-02 NOTE — Telephone Encounter (Signed)
Pt was started on Xarelto in February 2019 for PE. He tested positive for lupus anticoagulant and was referred to Dr Cyndie ChimeGranfortuna in March. He was then changed to warfarin in June 2019 s/p mitral valve repair. Will coordinate Lovenox bridge in clinic due to recent PE and positive lupus anticoagulant. Next INR check is 8/19, will coordinate then.

## 2017-09-06 ENCOUNTER — Telehealth (HOSPITAL_COMMUNITY): Payer: Self-pay | Admitting: *Deleted

## 2017-09-06 ENCOUNTER — Ambulatory Visit (HOSPITAL_COMMUNITY)
Admission: RE | Admit: 2017-09-06 | Discharge: 2017-09-06 | Disposition: A | Discharge status: Home / Self Care | Payer: Self-pay | Source: Ambulatory Visit | Attending: Cardiology | Admitting: Cardiology

## 2017-09-06 ENCOUNTER — Ambulatory Visit (INDEPENDENT_AMBULATORY_CARE_PROVIDER_SITE_OTHER): Payer: Self-pay | Admitting: *Deleted

## 2017-09-06 DIAGNOSIS — I9789 Other postprocedural complications and disorders of the circulatory system, not elsewhere classified: Secondary | ICD-10-CM

## 2017-09-06 DIAGNOSIS — I729 Aneurysm of unspecified site: Secondary | ICD-10-CM

## 2017-09-06 DIAGNOSIS — R590 Localized enlarged lymph nodes: Secondary | ICD-10-CM | POA: Insufficient documentation

## 2017-09-06 DIAGNOSIS — Z9889 Other specified postprocedural states: Secondary | ICD-10-CM

## 2017-09-06 DIAGNOSIS — I340 Nonrheumatic mitral (valve) insufficiency: Secondary | ICD-10-CM

## 2017-09-06 DIAGNOSIS — Z5181 Encounter for therapeutic drug level monitoring: Secondary | ICD-10-CM

## 2017-09-06 DIAGNOSIS — T81718A Complication of other artery following a procedure, not elsewhere classified, initial encounter: Secondary | ICD-10-CM

## 2017-09-06 DIAGNOSIS — D6862 Lupus anticoagulant syndrome: Secondary | ICD-10-CM

## 2017-09-06 LAB — POCT INR: INR: 1.9 — AB (ref 2.0–3.0)

## 2017-09-06 MED ORDER — ENOXAPARIN SODIUM 120 MG/0.8ML ~~LOC~~ SOLN: 120.0000 mg | Freq: Two times a day (BID) | SUBCUTANEOUS | 1 refills | Status: DC

## 2017-09-06 NOTE — Patient Instructions (Addendum)
Description   Today take 1 tablet then continue taking 1/2 tablet daily except 1 tablet on Tuesdays, Thursdays, and Saturdays.  Recheck INR in 1 week after procedure. Call with any new medications or procedures [facility] [phone]    09/17/17: Last dose of Coumadin.  09/18/17: No Coumadin or Lovenox.  09/19/17: Inject Lovenox 120mg  in the fatty abdominal tissue at least 2 inches from the belly button twice a day about 12 hours apart, 8am and 8pm rotate sites. No Coumadin.  09/20/17: Inject Lovenox in the fatty tissue every 12 hours, 8am and 8pm. No Coumadin.  09/21/17: Inject Lovenox in the fatty tissue every 12 hours, 8am and 8pm. No Coumadin.   09/22/17: Inject Lovenox in the fatty tissue in the morning at 8 am (No PM dose). No Coumadin.  09/23/17: Procedure Day - No Lovenox - Resume Coumadin in the evening or as directed by doctor (take an extra half tablet with usual dose for 2 days then resume normal dose).  09/24/17: Resume Lovenox inject in the fatty tissue every 12 hours and take Coumadin.  09/25/17: Inject Lovenox in the fatty tissue every 12 hours and take Coumadin.  09/26/17: Inject Lovenox in the fatty tissue every 12 hours and take Coumadin.  09/27/17: Inject Lovenox in the fatty tissue every 12 hours and take Coumadin.  09/28/17: Inject Lovenox in the fatty tissue every 12 hours and take Coumadin.  09/29/17: Inject Lovenox in the fatty tissue in the morning at 8 am and report to Coumadin appt to check INR.

## 2017-09-06 NOTE — Telephone Encounter (Signed)
Pt needs Lovenox bridge for RHC per Dr Shirlee LatchMcLean, pt has no insurance and can not afford the Lovenox.  [facility] under HF FUND.

## 2017-09-06 NOTE — Progress Notes (Signed)
Left pseudoaneurysm surveillance has been completed. Negative for evidence of a left pseudoaneurysm.  09/06/17 10:06 AM Olen CordialGreg Laysha Childers RVT

## 2017-09-13 MED FILL — ATORVASTATIN 80 MG TABLET: 80 | 30 days supply | Qty: 30 | Fill #1

## 2017-09-13 MED FILL — FUROSEMIDE 20 MG TABS: 20 | 30 days supply | Qty: 30 | Fill #1

## 2017-09-13 MED FILL — DIGOXIN 250 MCG TAB: 250 | 30 days supply | Qty: 30 | Fill #1

## 2017-09-13 MED FILL — CARVEDILOL 3.125 MG TABLET: 3.125 | 30 days supply | Qty: 60 | Fill #1

## 2017-09-13 MED FILL — LOSARTAN POTASSIUM 25 MG TA: 25 | 30 days supply | Qty: 15 | Fill #1

## 2017-09-15 ENCOUNTER — Inpatient Hospital Stay (HOSPITAL_COMMUNITY): Payer: Medicaid Other

## 2017-09-15 ENCOUNTER — Other Ambulatory Visit (HOSPITAL_COMMUNITY): Payer: Self-pay | Admitting: *Deleted

## 2017-09-15 ENCOUNTER — Telehealth: Payer: Self-pay | Admitting: *Deleted

## 2017-09-15 ENCOUNTER — Ambulatory Visit (HOSPITAL_COMMUNITY)
Admission: RE | Admit: 2017-09-15 | Discharge: 2017-09-15 | Disposition: A | Discharge status: Home / Self Care | Payer: Medicaid Other | Source: Ambulatory Visit | Attending: Internal Medicine | Admitting: Internal Medicine

## 2017-09-15 DIAGNOSIS — I5022 Chronic systolic (congestive) heart failure: Secondary | ICD-10-CM

## 2017-09-15 LAB — BASIC METABOLIC PANEL
ANION GAP: 8 (ref 5–15)
BUN: 12 mg/dL (ref 6–20)
CALCIUM: 9.2 mg/dL (ref 8.9–10.3)
CO2: 27 mmol/L (ref 22–32)
CREATININE: 0.99 mg/dL (ref 0.61–1.24)
Chloride: 108 mmol/L (ref 98–111)
GLUCOSE: 98 mg/dL (ref 70–99)
Potassium: 3.8 mmol/L (ref 3.5–5.1)
Sodium: 143 mmol/L (ref 135–145)

## 2017-09-15 LAB — PROTIME-INR
INR: 1.26
PROTHROMBIN TIME: 15.7 s — AB (ref 11.4–15.2)

## 2017-09-15 LAB — CBC
HEMATOCRIT: 41.6 % (ref 39.0–52.0)
Hemoglobin: 13 g/dL (ref 13.0–17.0)
MCH: 28.4 pg (ref 26.0–34.0)
MCHC: 31.3 g/dL (ref 30.0–36.0)
MCV: 91 fL (ref 78.0–100.0)
PLATELETS: 268 10*3/uL (ref 150–400)
RBC: 4.57 MIL/uL (ref 4.22–5.81)
RDW: 13.3 % (ref 11.5–15.5)
WBC: 7.7 10*3/uL (ref 4.0–10.5)

## 2017-09-15 MED FILL — ENOXAPARIN 120 MG/0.8 ML SY: 120 | 9 days supply | Qty: 14 | Fill #0

## 2017-09-15 NOTE — Progress Notes (Signed)
CSW met with patient at his request. Patient shard that he received a denial from disability and requesting assistance with appeal process. CSW discussed and reviewed denial letter. Patient will proceed with appeal and reach out to CSW as needed. Patient verbalizes understanding of appeal process and grateful for assistance. Raquel Sarna, [place], [place]

## 2017-09-15 NOTE — Addendum Note (Signed)
Encounter addended by: Marcy SirenBrennan, Deshanna Kama S, LCSW on: 09/15/2017 3:22 PM  Actions taken: Sign clinical note

## 2017-09-15 NOTE — Telephone Encounter (Signed)
Spoke with pt about low INR today that was done today with lab work. Discuss this with pharmacist and he is due to to take last dose on 09/17/17 and start Lovenox 09/19/17. But, since he missed a dose and INR is low today, pt will start Lovenox injections today and hold Coumadin until after procedure. Follow instructions as printed at office visit with the exception of start holding coumadin today and start Lovenox injections today at 8am and 8pm schedule.

## 2017-09-22 ENCOUNTER — Other Ambulatory Visit: Payer: Self-pay

## 2017-09-22 ENCOUNTER — Ambulatory Visit (HOSPITAL_COMMUNITY)
Admission: RE | Admit: 2017-09-22 | Discharge: 2017-09-22 | Disposition: A | Discharge status: Home / Self Care | Payer: Medicaid Other | Source: Ambulatory Visit | Attending: Cardiology | Admitting: Cardiology

## 2017-09-22 ENCOUNTER — Encounter (HOSPITAL_COMMUNITY)
Admission: RE | Disposition: A | Discharge status: Home / Self Care | Payer: Self-pay | Source: Ambulatory Visit | Attending: Cardiology

## 2017-09-22 DIAGNOSIS — E785 Hyperlipidemia, unspecified: Secondary | ICD-10-CM | POA: Insufficient documentation

## 2017-09-22 DIAGNOSIS — Z955 Presence of coronary angioplasty implant and graft: Secondary | ICD-10-CM | POA: Diagnosis not present

## 2017-09-22 DIAGNOSIS — Z7901 Long term (current) use of anticoagulants: Secondary | ICD-10-CM | POA: Diagnosis not present

## 2017-09-22 DIAGNOSIS — I340 Nonrheumatic mitral (valve) insufficiency: Secondary | ICD-10-CM | POA: Diagnosis not present

## 2017-09-22 DIAGNOSIS — I2510 Atherosclerotic heart disease of native coronary artery without angina pectoris: Secondary | ICD-10-CM | POA: Insufficient documentation

## 2017-09-22 DIAGNOSIS — I255 Ischemic cardiomyopathy: Secondary | ICD-10-CM | POA: Diagnosis not present

## 2017-09-22 DIAGNOSIS — I2721 Secondary pulmonary arterial hypertension: Secondary | ICD-10-CM | POA: Insufficient documentation

## 2017-09-22 DIAGNOSIS — I509 Heart failure, unspecified: Secondary | ICD-10-CM

## 2017-09-22 DIAGNOSIS — I252 Old myocardial infarction: Secondary | ICD-10-CM | POA: Insufficient documentation

## 2017-09-22 DIAGNOSIS — Z87891 Personal history of nicotine dependence: Secondary | ICD-10-CM | POA: Diagnosis not present

## 2017-09-22 DIAGNOSIS — I5022 Chronic systolic (congestive) heart failure: Secondary | ICD-10-CM | POA: Diagnosis not present

## 2017-09-22 HISTORY — PX: RIGHT HEART CATH: CATH118263

## 2017-09-22 LAB — POCT I-STAT 3, VENOUS BLOOD GAS (G3P V)
Acid-base deficit: 3 mmol/L — ABNORMAL HIGH (ref 0.0–2.0)
Bicarbonate: 24.2 mmol/L (ref 20.0–28.0)
O2 Saturation: 65 %
PCO2 VEN: 50.5 mmHg (ref 44.0–60.0)
PO2 VEN: 38 mmHg (ref 32.0–45.0)
TCO2: 26 mmol/L (ref 22–32)
pH, Ven: 7.288 (ref 7.250–7.430)

## 2017-09-22 LAB — PROTIME-INR
INR: 0.98
Prothrombin Time: 12.9 seconds (ref 11.4–15.2)

## 2017-09-22 SURGERY — RIGHT HEART CATH
Anesthesia: LOCAL

## 2017-09-22 MED ORDER — SODIUM CHLORIDE 0.9 % IV SOLN: 250.0000 mL | INTRAVENOUS | Status: DC | PRN

## 2017-09-22 MED ORDER — SODIUM CHLORIDE 0.9% FLUSH: 3.0000 mL | Freq: Two times a day (BID) | INTRAVENOUS | Status: DC

## 2017-09-22 MED ORDER — HEPARIN (PORCINE) IN NACL 1000-0.9 UT/500ML-% IV SOLN
INTRAVENOUS | Status: DC | PRN
  Administered 2017-09-22: 500 mL

## 2017-09-22 MED ORDER — SODIUM CHLORIDE 0.9 % IV SOLN: INTRAVENOUS | Status: DC

## 2017-09-22 MED ORDER — FENTANYL CITRATE (PF) 100 MCG/2ML IJ SOLN
INTRAMUSCULAR | Status: AC
  Filled 2017-09-22: qty 2

## 2017-09-22 MED ORDER — LIDOCAINE HCL (PF) 1 % IJ SOLN
INTRAMUSCULAR | Status: AC
  Filled 2017-09-22: qty 30

## 2017-09-22 MED ORDER — FENTANYL CITRATE (PF) 100 MCG/2ML IJ SOLN
INTRAMUSCULAR | Status: DC | PRN
  Administered 2017-09-22: 25 ug via INTRAVENOUS

## 2017-09-22 MED ORDER — ASPIRIN 81 MG PO CHEW
81.0000 mg | CHEWABLE_TABLET | ORAL | Status: AC
  Administered 2017-09-22: 81 mg via ORAL
  Filled 2017-09-22: qty 1

## 2017-09-22 MED ORDER — HEPARIN (PORCINE) IN NACL 1000-0.9 UT/500ML-% IV SOLN
INTRAVENOUS | Status: AC
  Filled 2017-09-22: qty 500

## 2017-09-22 MED ORDER — SODIUM CHLORIDE 0.9% FLUSH: 3.0000 mL | INTRAVENOUS | Status: DC | PRN

## 2017-09-22 MED ORDER — MIDAZOLAM HCL 2 MG/2ML IJ SOLN
INTRAMUSCULAR | Status: DC | PRN
  Administered 2017-09-22: 1 mg via INTRAVENOUS

## 2017-09-22 MED ORDER — ACETAMINOPHEN 325 MG PO TABS: 650.0000 mg | ORAL_TABLET | ORAL | Status: DC | PRN

## 2017-09-22 MED ORDER — LIDOCAINE HCL (PF) 1 % IJ SOLN
INTRAMUSCULAR | Status: DC | PRN
  Administered 2017-09-22: 1 mL

## 2017-09-22 MED ORDER — ONDANSETRON HCL 4 MG/2ML IJ SOLN: 4.0000 mg | Freq: Four times a day (QID) | INTRAMUSCULAR | Status: DC | PRN

## 2017-09-22 MED ORDER — MIDAZOLAM HCL 2 MG/2ML IJ SOLN
INTRAMUSCULAR | Status: AC
  Filled 2017-09-22: qty 2

## 2017-09-22 SURGICAL SUPPLY — 6 items
CATH BALLN WEDGE 5F 110CM (CATHETERS) ×2 IMPLANT
PACK CARDIAC CATHETERIZATION (CUSTOM PROCEDURE TRAY) ×2 IMPLANT
SHEATH GLIDE SLENDER 4/5FR (SHEATH) ×2 IMPLANT
TRANSDUCER W/STOPCOCK (MISCELLANEOUS) ×2 IMPLANT
TUBING ART PRESS 72  MALE/FEM (TUBING) ×1
TUBING ART PRESS 72 MALE/FEM (TUBING) ×1 IMPLANT

## 2017-09-22 NOTE — Interval H&P Note (Signed)
History and Physical Interval Note:  09/22/2017 1:13 PM  Joshua May  has presented today for surgery, with the diagnosis of hf  The various methods of treatment have been discussed with the patient and family. After consideration of risks, benefits and other options for treatment, the patient has consented to  Procedure(s): RIGHT HEART CATH (N/A) as a surgical intervention .  The patient's history has been reviewed, patient examined, no change in status, stable for surgery.  I have reviewed the patient's chart and labs.  Questions were answered to the patient's satisfaction.     Carmelina Balducci [employer]

## 2017-09-22 NOTE — Discharge Instructions (Signed)
This sheet gives you information about how to care for yourself after your procedure. Your health care provider may also give you more specific instructions. If you have problems or questions, contact your health care provider. °What can I expect after the procedure? °After the procedure, it is common to have: °· Bruising or mild discomfort in the area where the IV was inserted (insertion site). ° °Follow these instructions at home: °Eating and drinking °· Follow instructions from your health care provider about eating or drinking restrictions. °· Drink a lot of fluids for the first several days after the procedure, as directed by your health care provider. This helps to wash (flush) the contrast out of your body. Examples of healthy fluids include water or low-calorie drinks. °General instructions °· Check your IV insertion area every day for signs of infection. Check for: °? Redness, swelling, or pain. °? Fluid or blood. °? Warmth. °? Pus or a bad smell. °· Take over-the-counter and prescription medicines only as told by your health care provider. °· Rest and return to your normal activities as told by your health care provider. Ask your health care provider what activities are safe for you. °· Do not drive for 24 hours if you were given a medicine to help you relax (sedative), or until your health care provider approves. °· Keep all follow-up visits as told by your health care provider. This is important. °Contact a health care provider if: °· Your skin becomes itchy or you develop a rash or hives. °· You have a fever that does not get better with medicine. °· You feel nauseous. °· You vomit. °· You have redness, swelling, or pain around the insertion site. °· You have fluid or blood coming from the insertion site. °· Your insertion area feels warm to the touch. °· You have pus or a bad smell coming from the insertion site. °Get help right away if: °· You have difficulty breathing or shortness of breath. °· You  develop chest pain. °· You faint. °· You feel very dizzy. °These symptoms may represent a serious problem that is an emergency. Do not wait to see if the symptoms will go away. Get medical help right away. Call your local emergency services (911 in the [place] Do not drive yourself to the hospital. °Summary °· After your procedure, it is common to have bruising or mild discomfort in the area where the IV was inserted. °· You should check your IV insertion area every day for signs of infection. °· Take over-the-counter and prescription medicines only as told by your health care provider. °· You should drink a lot of fluids for the first several days after the procedure to help flush the contrast from your body. °This information is not intended to replace advice given to you by your health care provider. Make sure you discuss any questions you have with your health care provider. °Document Released: 10/26/2012 Document Revised: 11/30/2015 Document Reviewed: 11/30/2015 °Elsevier Interactive Patient Education © 2017 [employer] ° °

## 2017-09-22 NOTE — Progress Notes (Signed)
Dr Shirlee Latch called about discharge time. States to discharge in 1 hour.

## 2017-09-23 ENCOUNTER — Encounter (HOSPITAL_COMMUNITY): Payer: Self-pay | Admitting: Cardiology

## 2017-09-27 ENCOUNTER — Encounter: Payer: Self-pay | Admitting: Thoracic Surgery (Cardiothoracic Vascular Surgery)

## 2017-09-27 ENCOUNTER — Encounter: Payer: Self-pay | Admitting: *Deleted

## 2017-09-27 ENCOUNTER — Ambulatory Visit (INDEPENDENT_AMBULATORY_CARE_PROVIDER_SITE_OTHER): Payer: Self-pay | Admitting: Thoracic Surgery (Cardiothoracic Vascular Surgery)

## 2017-09-27 VITALS — BP 98/72 | HR 83 | Resp 20 | Ht 71.0 in | Wt 259.0 lb

## 2017-09-27 DIAGNOSIS — Z006 Encounter for examination for normal comparison and control in clinical research program: Secondary | ICD-10-CM

## 2017-09-27 DIAGNOSIS — Z9889 Other specified postprocedural states: Secondary | ICD-10-CM

## 2017-09-27 DIAGNOSIS — Z8774 Personal history of (corrected) congenital malformations of heart and circulatory system: Secondary | ICD-10-CM

## 2017-09-27 NOTE — Patient Instructions (Addendum)

## 2017-09-27 NOTE — Progress Notes (Signed)
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  The patient's early postoperative convalescence in the hospital was uncomplicated although notable for the need for slow gradual weaning of intravenous pressors using milrinone given his underlying severe chronic systolic congestive heart failure.  He was followed closely in the hospital by the advanced heart failure team and was eventually discharged home on the seventh postoperative day.  He did not have any rhythm disturbances after surgery although he remained in sinus tachycardia heart rate ranging 100-110.  A conscious decision was made to avoid adding beta-blockers because of the patient's underlying profound left ventricular systolic dysfunction.  Echocardiogram performed prior to discharge on July 12, 2017 revealed severe left ventricular systolic function with ejection fraction estimated 25 to 30%.  There was diffuse hypokinesis of the left ventricle with disproportionate hypokinesis of the inferior and lateral wall.  The mitral valve repair appeared intact with no residual mitral regurgitation.  Mean transvalvular gradient was estimated 9 mmHg.   Since hospital discharge the patient has  been followed very carefully in the advanced heart failure clinic.  He was last seen here in our office on July 26, 2017.  He had a small amount of drainage from his right groin incision at that time.  Since then he underwent follow-up right heart catheterization.  Right heart pressures were only mildly elevated.  Cardiac output was low but stable.  No further adjustments in his medications were made at that time.  Some concern was raised regarding pain in his left groin and an ultrasound was done which ruled out false aneurysm.  He has been scheduled for another follow-up echocardiogram later this month and it is anticipated that he may need ICD placement.  He returns to our office today for follow-up.  He reports that he is doing fairly well.  He notes that he still gets short of breath with exertion but overall he feels better than he did prior to surgery.  He is not having any lower extremity edema and his weight has been stable.  He no longer has any significant soreness in his chest.  He still feels a "lump" in his left groin.  He has not had any drainage from his surgical incisions   Current Outpatient Medications  Medication Sig Dispense Refill  . atorvastatin (LIPITOR) 80 MG tablet Take 1 tablet (80 mg total) by mouth daily. 30 tablet 2  . carvedilol (COREG) 3.125 MG tablet Take 1 tablet (3.125 mg total) by mouth 2 (two) times daily. 60 tablet 3  . digoxin (LANOXIN) 0.25 MG tablet Take 1 tablet (0.25 mg total) by mouth daily. 30 tablet 3  . enoxaparin (LOVENOX) 120 MG/0.8ML injection Inject 0.8 mLs (120 mg total) into the skin every 12 (twelve) hours. 20  Syringe 1  . ferrous sulfate 325 (65 FE) MG tablet Take 1 tablet (325 mg total) by mouth daily with breakfast. For one month then stop. 30 tablet 3  . folic acid (FOLVITE) 1 MG tablet Take 1 tablet (1 mg total) by mouth daily. For one month then stop.    . furosemide (LASIX) 20 MG tablet Take 1 tablet (20 mg total) by mouth daily. 30 tablet 3  .  losartan (COZAAR) 25 MG tablet Take 0.5 tablets (12.5 mg total) by mouth every evening. 15 tablet 3  . Meclizine HCl 25 MG CHEW Chew 1 tablet (25 mg total) by mouth daily as needed. 30 tablet 0  . spironolactone (ALDACTONE) 25 MG tablet Take 1 tablet (25 mg total) by mouth daily. Take in the AM 30 tablet 3  . warfarin (COUMADIN) 7.5 MG tablet Take 1 tablet (7.5 mg total) by mouth daily at 6 PM. Or as directed 30 tablet 1   No current facility-administered medications for this visit.       Physical Exam:   BP 98/72   Pulse 83   Resp 20   Ht 5\' 11"  (1.803 m)   Wt 259 lb (117.5 kg)   SpO2 99% Comment: RA  BMI 36.12 kg/m   General:  Well-appearing  Chest:   Clear to auscultation with symmetrical breath sounds  CV:   Regular rate and rhythm without murmur  Incisions:  Completely healed, palpable lymph node in the subcutaneous tissues left groin  Abdomen:  Soft nontender  Extremities:  Warm and well-perfused, no edema  Diagnostic Tests:  n/a   Impression:  Patient is doing well from a surgical standpoint nearly 9-month status post minimally invasive mitral valve repair using an undersized ring annuloplasty for stage D severe symptomatic secondary mitral regurgitation caused by severe ischemic cardiomyopathy.    Plan:  We have not recommended any change the patient's current medications.  The patient has been reminded regarding the importance of dental hygiene and the lifelong need for antibiotic prophylaxis for all dental cleanings and other related invasive procedures.  He will continue to follow-up closely in the advanced heart failure clinic.  He will call return to our office should specific problems or questions arise.  Otherwise we will plan to see him back for routine follow-up next June, approximately 1 year following his surgery.    Salvatore Decent. Cornelius Moras, MD 09/27/2017 3:04 PM

## 2017-09-29 ENCOUNTER — Ambulatory Visit (INDEPENDENT_AMBULATORY_CARE_PROVIDER_SITE_OTHER): Payer: Self-pay | Admitting: Pharmacist

## 2017-09-29 DIAGNOSIS — Z9889 Other specified postprocedural states: Secondary | ICD-10-CM

## 2017-09-29 DIAGNOSIS — D6862 Lupus anticoagulant syndrome: Secondary | ICD-10-CM

## 2017-09-29 DIAGNOSIS — I340 Nonrheumatic mitral (valve) insufficiency: Secondary | ICD-10-CM

## 2017-09-29 DIAGNOSIS — Z5181 Encounter for therapeutic drug level monitoring: Secondary | ICD-10-CM

## 2017-09-29 LAB — POCT INR: INR: 1.9 — AB (ref 2.0–3.0)

## 2017-09-29 MED ORDER — WARFARIN SODIUM 7.5 MG PO TABS: ORAL_TABLET | ORAL | 1 refills | Status: DC

## 2017-09-29 NOTE — Patient Instructions (Signed)
Description   Today take 1 tablet then continue taking 1/2 tablet daily except 1 tablet on Tuesdays, Thursdays, and Saturdays.  Recheck INR in 2 weeks. Call with any new medications or procedures [facility] [phone] Main [phone]

## 2017-10-04 ENCOUNTER — Ambulatory Visit (HOSPITAL_BASED_OUTPATIENT_CLINIC_OR_DEPARTMENT_OTHER)
Admission: RE | Admit: 2017-10-04 | Discharge: 2017-10-04 | Disposition: A | Discharge status: Home / Self Care | Payer: Medicaid Other | Source: Ambulatory Visit | Attending: Cardiology | Admitting: Cardiology

## 2017-10-04 ENCOUNTER — Other Ambulatory Visit: Payer: Self-pay

## 2017-10-04 ENCOUNTER — Encounter (HOSPITAL_COMMUNITY): Payer: Self-pay | Admitting: Cardiology

## 2017-10-04 ENCOUNTER — Ambulatory Visit (HOSPITAL_COMMUNITY)
Admission: RE | Admit: 2017-10-04 | Discharge: 2017-10-04 | Disposition: A | Discharge status: Home / Self Care | Payer: Medicaid Other | Source: Ambulatory Visit | Attending: Cardiology | Admitting: Cardiology

## 2017-10-04 ENCOUNTER — Encounter: Payer: Self-pay | Admitting: *Deleted

## 2017-10-04 VITALS — BP 122/80 | HR 77 | Wt 261.5 lb

## 2017-10-04 DIAGNOSIS — Z87891 Personal history of nicotine dependence: Secondary | ICD-10-CM | POA: Diagnosis not present

## 2017-10-04 DIAGNOSIS — I255 Ischemic cardiomyopathy: Secondary | ICD-10-CM | POA: Diagnosis not present

## 2017-10-04 DIAGNOSIS — Z79899 Other long term (current) drug therapy: Secondary | ICD-10-CM | POA: Insufficient documentation

## 2017-10-04 DIAGNOSIS — I2510 Atherosclerotic heart disease of native coronary artery without angina pectoris: Secondary | ICD-10-CM | POA: Insufficient documentation

## 2017-10-04 DIAGNOSIS — Z7901 Long term (current) use of anticoagulants: Secondary | ICD-10-CM | POA: Insufficient documentation

## 2017-10-04 DIAGNOSIS — I5022 Chronic systolic (congestive) heart failure: Secondary | ICD-10-CM | POA: Diagnosis present

## 2017-10-04 DIAGNOSIS — I517 Cardiomegaly: Secondary | ICD-10-CM | POA: Insufficient documentation

## 2017-10-04 DIAGNOSIS — I252 Old myocardial infarction: Secondary | ICD-10-CM | POA: Insufficient documentation

## 2017-10-04 DIAGNOSIS — Z006 Encounter for examination for normal comparison and control in clinical research program: Secondary | ICD-10-CM

## 2017-10-04 DIAGNOSIS — I25110 Atherosclerotic heart disease of native coronary artery with unstable angina pectoris: Secondary | ICD-10-CM

## 2017-10-04 DIAGNOSIS — E785 Hyperlipidemia, unspecified: Secondary | ICD-10-CM | POA: Insufficient documentation

## 2017-10-04 DIAGNOSIS — I739 Peripheral vascular disease, unspecified: Secondary | ICD-10-CM | POA: Diagnosis not present

## 2017-10-04 DIAGNOSIS — I340 Nonrheumatic mitral (valve) insufficiency: Secondary | ICD-10-CM

## 2017-10-04 LAB — BASIC METABOLIC PANEL
ANION GAP: 9 (ref 5–15)
BUN: 5 mg/dL — ABNORMAL LOW (ref 6–20)
CALCIUM: 9 mg/dL (ref 8.9–10.3)
CHLORIDE: 105 mmol/L (ref 98–111)
CO2: 27 mmol/L (ref 22–32)
Creatinine, Ser: 0.9 mg/dL (ref 0.61–1.24)
GFR calc non Af Amer: >60 mL/min (ref >60)
Glucose, Bld: 103 mg/dL — ABNORMAL HIGH (ref 70–99)
Potassium: 4.1 mmol/L (ref 3.5–5.1)
Sodium: 141 mmol/L (ref 135–145)

## 2017-10-04 LAB — CBC
HCT: 42.1 % (ref 39.0–52.0)
HEMOGLOBIN: 12.6 g/dL — AB (ref 13.0–17.0)
MCH: 27.9 pg (ref 26.0–34.0)
MCHC: 29.9 g/dL — ABNORMAL LOW (ref 30.0–36.0)
MCV: 93.3 fL (ref 78.0–100.0)
Platelets: 341 10*3/uL (ref 150–400)
RBC: 4.51 MIL/uL (ref 4.22–5.81)
RDW: 13.9 % (ref 11.5–15.5)
WBC: 7.2 10*3/uL (ref 4.0–10.5)

## 2017-10-04 LAB — DIGOXIN LEVEL: DIGOXIN LVL: 1.2 ng/mL (ref 0.8–2.0)

## 2017-10-04 MED ORDER — FUROSEMIDE 40 MG PO TABS: 40.0000 mg | ORAL_TABLET | Freq: Every day | ORAL | 3 refills | Status: DC

## 2017-10-04 MED ORDER — SACUBITRIL-VALSARTAN 24-26 MG PO TABS: 1.0000 | ORAL_TABLET | Freq: Two times a day (BID) | ORAL | 3 refills | Status: DC

## 2017-10-04 MED FILL — FUROSEMIDE 40 MG TAB: 40 | 34 days supply | Qty: 34 | Fill #0

## 2017-10-04 MED FILL — ENTRESTO 24 MG-26 MG TABLET: 24-26 | 30 days supply | Qty: 60 | Fill #0

## 2017-10-04 NOTE — Progress Notes (Signed)
  Echocardiogram 2D Echocardiogram has been performed.  [name], Consuello Lassalle M 10/04/2017, 10:50 AM

## 2017-10-04 NOTE — Patient Instructions (Signed)
Labs today (will call for abnormal results, otherwise no news is good news)  STOP taking Losartan  START taking Entresto 24-26 mg (1 Tablet) Twice Daily  INCREASE Lasix to 40 mg Once Daily  You have been referred to EP for possible ICD placement, their office will contact you to shcedule  Labs in 10 days (bmet)  Follow up in our APP Clinic 3 weeks  Follow up with Dr. Shirlee LatchMcLean in 6 weeks.

## 2017-10-04 NOTE — Progress Notes (Signed)
PCP: None  HF Cardiology: Dr Shirlee LatchMcLean  CT Surgery: Dr Cornelius Moraswen  HPI: [name] H Keiffer is a 42 y.o. [name] with a history of CAD dating back to 2007 when he had [place] in [place]. In 2010 he presented with a STEMI and had an RCA DES placed. He followed up for a year but then didn't present again till Oct 2017 when he presented with a CFX infarct. Cath revealed 50% ISR of the RCA, total mCFX and 85% OM3. He had CFX PCI with DES and OM3 POBA. His EF then was 50-55%.  Admitted 02/27/17 with increased dyspnea and chest pain. CTA confirmed PE. ECHO completed and showed reduced EF 20-25%. Underwent LHC as noted below. US no evidence DVTs. CT surgery consulted for severe MR. Plan to optimize HF medications.Set up TEE as an outpatient. Discharge weight 222 pounds.   Admitted 06/2017 with scheduled MV repair. S/P Minimally invasive mitral valve repair on 6/19. ECHO 07/12/17 post surgery, no MR was noted but EF remained 25-30%. Tachycardic at the time of discharge. Discharge weight 257 pounds.   CPX was submaximal in 8/19, suggestive of marked deconditioning.  RHC in 9/19 showed relatively preserved cardiac index with only mildly elevated filling pressures.  Echo was done today and reviewed, EF 30-35%.   He returns today for followup of CHF and CAD.  He still fatigues very easily with exertion.  No insurance yet (working on [employer]), so cannot do cardiac rehab.  He is still short of breath walking 7-8 minutes.  He describes bendopnea.  He is short of breath walking up stairs.  He has 3 pillow orthopnea.  No chest pain.    Labs (7/19): K 4.5, creatinine 0.95, LDL 51 Labs (8/19): K 3.8, creatinine 0.99  PMH: 1. CAD: [place] PCI in 2007 in [place], inferior MI.   - Inferior STEMI 2010 with RCA PCI.  - CFx infarct in 2017 with LCx DES and OM3 POBA.  - LHC (2/19): Nonobstructive CAD.  2. 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  - TEE (4/19): LVEF 35-40%, Severe MR with PISA ERO 0.43 cm^2. Suspect infarct related MR with  tethering of the posterior leaflet.  - Minimally invasive MV repair in 6/19.   3. Chronic systolic CHF: Ischemic cardiomyopathy.  - Echo (2/19): EF 25-30%, diffuse HK with inferolateral AK, mildly decreased RV systolic function with D-shaped septum, moderate-severe infarct-related MR.  - Echo (6/19): EF 25-30%, apical and inferolateral hypokinesis, MV repair with mean gradient 9 mmHg with no MR, mildly decreased RV systolic function.  - CPX (8/19): VO2 13.2, VE/VCO2 slope 28, RER 0.87.  This study was suggestive of severe deconditioning.  - RHC (9/19): mean RA 9, PA 39/23 mean 24, mean PCWP 17, CI 2.2, PVR 1.35 WU - Echo (9/19): EF 30-35%, diffuse hypokinesis, normal RV, s/p MV repair with mean gradient 6 mmHg and PHT 104 msec => probably no significant stenosis.  4. Hyperlipidemia 5. PE: 2/19.   Review of systems complete and found to be negative unless listed in HPI.    Social History   Socioeconomic History  . Marital status: Married    Spouse name: Not on file  . Number of children: Not on file  . Years of education: Not on file  . Highest education level: Not on file  Occupational History  . Not on file  Social Needs  . Financial resource strain: Not on file  . Food insecurity:    Worry: Not on file    Inability: Not on  file  . Transportation needs:    Medical: Not on file    Non-medical: Not on file  Tobacco Use  . Smoking status: Former Smoker    Packs/day: 0.50    Years: 17.00    Pack years: 8.50    Types: Cigarettes  . Smokeless tobacco: Never Used  . Tobacco comment: quit since heart attack in 10/2015  Substance and Sexual Activity  . Alcohol use: Not Currently  . Drug use: Not Currently    Types: Marijuana  . Sexual activity: Not on file  Lifestyle  . Physical activity:    Days per week: Not on file    Minutes per session: Not on file  . Stress: Not on file  Relationships  . Social connections:    Talks on phone: Not on file    Gets together: Not on file     Attends religious service: Not on file    Active member of club or organization: Not on file    Attends meetings of clubs or organizations: Not on file    Relationship status: Not on file  . Intimate partner violence:    Fear of current or ex partner: Not on file    Emotionally abused: Not on file    Physically abused: Not on file    Forced sexual activity: Not on file  Other Topics Concern  . Not on file  Social History Narrative   The patient is married with two children.   Family History  Problem Relation Age of Onset  . Other Sister 10       THE PATIENT REPORTS A SISTER WITH A MYOCARDIAL INFARCTION IN HER 30s.(SHE IS NOW S/P CABG)  . Heart attack Father   . Deep vein thrombosis Father     Current Outpatient Medications  Medication Sig Dispense Refill  . atorvastatin (LIPITOR) 80 MG tablet Take 1 tablet (80 mg total) by mouth daily. 30 tablet 2  . carvedilol (COREG) 3.125 MG tablet Take 1 tablet (3.125 mg total) by mouth 2 (two) times daily. 60 tablet 3  . digoxin (LANOXIN) 0.25 MG tablet Take 1 tablet (0.25 mg total) by mouth daily. 30 tablet 3  . ferrous sulfate 325 (65 FE) MG tablet Take 1 tablet (325 mg total) by mouth daily with breakfast. For one month then stop. 30 tablet 3  . folic acid (FOLVITE) 1 MG tablet Take 1 tablet (1 mg total) by mouth daily. For one month then stop.    . furosemide (LASIX) 40 MG tablet Take 1 tablet (40 mg total) by mouth daily. 34 tablet 3  . Meclizine HCl 25 MG CHEW Chew 1 tablet (25 mg total) by mouth daily as needed. 30 tablet 0  . spironolactone (ALDACTONE) 25 MG tablet Take 1 tablet (25 mg total) by mouth daily. Take in the AM 30 tablet 3  . warfarin (COUMADIN) 7.5 MG tablet Take 1/2 to 1 tablet daily as directed by Coumadin clinic 30 tablet 1  . sacubitril-valsartan (ENTRESTO) 24-26 MG Take 1 tablet by mouth 2 (two) times daily. 60 tablet 3   No current facility-administered medications for this encounter.    Vitals:   10/04/17  1059  BP: 122/80  Pulse: 77  SpO2: 100%  Weight: 118.6 kg (261 lb 8 oz)    Wt Readings from Last 3 Encounters:  10/04/17 118.6 kg (261 lb 8 oz)  09/27/17 117.5 kg (259 lb)  09/22/17 114.8 kg (253 lb)    PHYSICAL EXAM: General: NAD Neck:  Thick, JVP 8-9 cm, no thyromegaly or thyroid nodule.  Lungs: Clear to auscultation bilaterally with normal respiratory effort. CV: Nonpalpable PMI.  Heart regular S1/S2, no S3/S4, no murmur.  Trace ankle edema.  No carotid bruit.  Difficult to palpate pedal pulses.  Abdomen: Soft, nontender, no hepatosplenomegaly, no distention.  Skin: Intact without lesions or rashes.  Neurologic: Alert and oriented x 3.  Psych: Normal affect. Extremities: No clubbing or cyanosis.  HEENT: Normal.   ASSESSMENT & PLAN: 1. Chronic systolic CHF: Ischemic cardiomyopathy. Echo in 6/19 with EF 25-30%, echo done today with EF 30-35%.  He has NYHA class III symptoms.  RHC in 9/19 showed only mild fluid overload and cardiac index was relatively preserved at 2.2. CPX showed marked deconditioning.   - Continue digoxin 0.25.  Check digoxin level today.  - Continue spironolactone 25 mg daily.   - Continue Coreg 3.125 mg bid.  - Stop losartan, start Entresto 24/26 bid.  BMET today and in 10 days.   - Increase Lasix to 40 mg daily, suspect mild volume overload at least.  - EF remains low, he qualifies for ICD. Needs to get Medicaid coverage prior to this ([place] Narrow QRS, not candidate for CRT. Will refer to EP, hopefully Medicaid will come through soon.  - He may benefit from barostimulator device, he is being evaluated for BEAT-HF.  2. CAD: Stents in LCx and RCA. LHC 02/2017 showed nonobstructive disease. No chest pain.   - Continue atorvastatin, good lipids 7/19. - He is on warfarin so no ASA 81.  3. PE: In 2019, no definite trigger. Father with h/o VTE.  He should likely have long-term anticoagulation.   - He is on warfarin.   4. Mitral  regurgitation: Ischemic MR.  S/p minimally invasive MV repair in 6/19. Post-op echo 6/19 showed no MR but mean gradient 9 mmHg across MV. Echo (9/19) with mean MV gradient 6 mmHg but PHT short at 104 msec, probably no significant stenosis.  5. Former smoker: Encouraged continued abstinence.  6. PAD: Patient has frequent leg pain, difficult to palpate pedal pulses.  I will arrange for peripheral arterial doppler evaluation to assess for PAD.   Followup with APP in 2 wks, me in 1 month.   Marca Ancona 10/04/2017

## 2017-10-05 ENCOUNTER — Other Ambulatory Visit (HOSPITAL_COMMUNITY): Payer: Self-pay | Admitting: *Deleted

## 2017-10-05 ENCOUNTER — Telehealth (HOSPITAL_COMMUNITY): Payer: Self-pay | Admitting: *Deleted

## 2017-10-05 MED ORDER — DIGOXIN 125 MCG PO TABS: 0.1250 mg | ORAL_TABLET | Freq: Every day | ORAL | 3 refills | Status: DC

## 2017-10-05 MED ORDER — SACUBITRIL-VALSARTAN 24-26 MG PO TABS: 1.0000 | ORAL_TABLET | Freq: Two times a day (BID) | ORAL | 11 refills | Status: DC

## 2017-10-05 MED FILL — DIGOXIN 0.125 MG TABLET: 125 | 31 days supply | Qty: 31 | Fill #0

## 2017-10-05 NOTE — Telephone Encounter (Signed)
Result Notes for Basic Metabolic Panel (BMET)   Notes recorded by Georgina PeerFarver, Margree Gimbel S, RN on 10/05/2017 at 12:23 PM EDT Patient is aware and decreased dose sent to pharmacy. ------  Notes recorded by Laurey MoraleMcLean, Dalton S, MD on 10/05/2017 at 12:23 AM EDT Decrease digoxin to 0.125 daily.

## 2017-10-13 ENCOUNTER — Ambulatory Visit (INDEPENDENT_AMBULATORY_CARE_PROVIDER_SITE_OTHER): Payer: Self-pay | Admitting: *Deleted

## 2017-10-13 DIAGNOSIS — Z5181 Encounter for therapeutic drug level monitoring: Secondary | ICD-10-CM

## 2017-10-13 DIAGNOSIS — D6862 Lupus anticoagulant syndrome: Secondary | ICD-10-CM

## 2017-10-13 DIAGNOSIS — Z9889 Other specified postprocedural states: Secondary | ICD-10-CM

## 2017-10-13 DIAGNOSIS — I340 Nonrheumatic mitral (valve) insufficiency: Secondary | ICD-10-CM

## 2017-10-13 LAB — POCT INR: INR: 2.2 (ref 2.0–3.0)

## 2017-10-13 NOTE — Patient Instructions (Addendum)
Description   Continue taking 1/2 tablet daily except 1 tablet on Tuesdays, Thursdays, and Saturdays.  Recheck INR in 2 weeks with [place] appt. Call with any new medications or procedures [facility] [phone] Main [phone]

## 2017-10-14 ENCOUNTER — Ambulatory Visit (HOSPITAL_COMMUNITY)
Admission: RE | Admit: 2017-10-14 | Discharge: 2017-10-14 | Disposition: A | Discharge status: Home / Self Care | Payer: Medicaid Other | Source: Ambulatory Visit | Attending: Cardiology | Admitting: Cardiology

## 2017-10-14 DIAGNOSIS — I5022 Chronic systolic (congestive) heart failure: Secondary | ICD-10-CM | POA: Diagnosis not present

## 2017-10-14 LAB — BASIC METABOLIC PANEL
ANION GAP: 10 (ref 5–15)
BUN: 7 mg/dL (ref 6–20)
CO2: 25 mmol/L (ref 22–32)
Calcium: 8.6 mg/dL — ABNORMAL LOW (ref 8.9–10.3)
Chloride: 107 mmol/L (ref 98–111)
Creatinine, Ser: 1 mg/dL (ref 0.61–1.24)
Glucose, Bld: 113 mg/dL — ABNORMAL HIGH (ref 70–99)
POTASSIUM: 3.6 mmol/L (ref 3.5–5.1)
SODIUM: 142 mmol/L (ref 135–145)

## 2017-10-14 NOTE — Addendum Note (Signed)
Encounter addended by: Marcy Siren, LCSW on: 10/14/2017 10:43 AM  Actions taken: Sign clinical note

## 2017-10-14 NOTE — Progress Notes (Signed)
Patient requested to see CSW in the clinic. Patient reports he received a letter form [employer] stating that his disability case was under review. Patient provided number [phone] and his case # is [id number]. Patient appears relieved to know his case is still active and under review. CSW will be available to assist as needed. Lasandra Beech, LCSW, CCSW-MCS [phone]

## 2017-10-22 MED FILL — DIGOXIN 250 MCG TAB: 250 | 30 days supply | Qty: 30 | Fill #2

## 2017-10-22 MED FILL — FUROSEMIDE 20 MG TABS: 20 | 30 days supply | Qty: 30 | Fill #2

## 2017-10-22 MED FILL — LOSARTAN POTASSIUM 25 MG TA: 25 | 30 days supply | Qty: 15 | Fill #2

## 2017-10-22 MED FILL — CARVEDILOL 3.125 MG TABLET: 3.125 | 30 days supply | Qty: 60 | Fill #2

## 2017-10-22 MED FILL — SPIRONOLACTONE 25 MG TABLET: 25 | 30 days supply | Qty: 30 | Fill #1

## 2017-10-22 MED FILL — ATORVASTATIN 80 MG TABLET: 80 | 30 days supply | Qty: 30 | Fill #2

## 2017-10-27 ENCOUNTER — Telehealth (HOSPITAL_COMMUNITY): Payer: Self-pay

## 2017-10-27 ENCOUNTER — Ambulatory Visit (INDEPENDENT_AMBULATORY_CARE_PROVIDER_SITE_OTHER): Payer: Self-pay | Admitting: Cardiology

## 2017-10-27 ENCOUNTER — Other Ambulatory Visit (HOSPITAL_COMMUNITY): Payer: Self-pay

## 2017-10-27 ENCOUNTER — Ambulatory Visit (INDEPENDENT_AMBULATORY_CARE_PROVIDER_SITE_OTHER): Payer: Self-pay | Admitting: *Deleted

## 2017-10-27 ENCOUNTER — Ambulatory Visit (HOSPITAL_COMMUNITY)
Admission: RE | Admit: 2017-10-27 | Discharge: 2017-10-27 | Disposition: A | Discharge status: Home / Self Care | Payer: Medicaid Other | Source: Ambulatory Visit | Attending: Internal Medicine | Admitting: Internal Medicine

## 2017-10-27 ENCOUNTER — Encounter: Payer: Self-pay | Admitting: Cardiology

## 2017-10-27 VITALS — BP 134/82 | HR 79 | Wt 265.4 lb

## 2017-10-27 VITALS — BP 110/62 | HR 81 | Ht 71.0 in | Wt 263.0 lb

## 2017-10-27 DIAGNOSIS — I340 Nonrheumatic mitral (valve) insufficiency: Secondary | ICD-10-CM

## 2017-10-27 DIAGNOSIS — I5022 Chronic systolic (congestive) heart failure: Secondary | ICD-10-CM | POA: Insufficient documentation

## 2017-10-27 DIAGNOSIS — E785 Hyperlipidemia, unspecified: Secondary | ICD-10-CM | POA: Insufficient documentation

## 2017-10-27 DIAGNOSIS — R002 Palpitations: Secondary | ICD-10-CM | POA: Insufficient documentation

## 2017-10-27 DIAGNOSIS — Z87891 Personal history of nicotine dependence: Secondary | ICD-10-CM | POA: Diagnosis not present

## 2017-10-27 DIAGNOSIS — Z9889 Other specified postprocedural states: Secondary | ICD-10-CM

## 2017-10-27 DIAGNOSIS — Z7901 Long term (current) use of anticoagulants: Secondary | ICD-10-CM | POA: Diagnosis not present

## 2017-10-27 DIAGNOSIS — I255 Ischemic cardiomyopathy: Secondary | ICD-10-CM | POA: Insufficient documentation

## 2017-10-27 DIAGNOSIS — I2510 Atherosclerotic heart disease of native coronary artery without angina pectoris: Secondary | ICD-10-CM | POA: Diagnosis not present

## 2017-10-27 DIAGNOSIS — Z79899 Other long term (current) drug therapy: Secondary | ICD-10-CM | POA: Diagnosis not present

## 2017-10-27 DIAGNOSIS — Z5181 Encounter for therapeutic drug level monitoring: Secondary | ICD-10-CM

## 2017-10-27 DIAGNOSIS — I739 Peripheral vascular disease, unspecified: Secondary | ICD-10-CM | POA: Insufficient documentation

## 2017-10-27 DIAGNOSIS — G4733 Obstructive sleep apnea (adult) (pediatric): Secondary | ICD-10-CM

## 2017-10-27 DIAGNOSIS — D6862 Lupus anticoagulant syndrome: Secondary | ICD-10-CM

## 2017-10-27 DIAGNOSIS — G629 Polyneuropathy, unspecified: Secondary | ICD-10-CM | POA: Diagnosis not present

## 2017-10-27 DIAGNOSIS — I252 Old myocardial infarction: Secondary | ICD-10-CM | POA: Insufficient documentation

## 2017-10-27 DIAGNOSIS — F17200 Nicotine dependence, unspecified, uncomplicated: Secondary | ICD-10-CM

## 2017-10-27 DIAGNOSIS — I2699 Other pulmonary embolism without acute cor pulmonale: Secondary | ICD-10-CM

## 2017-10-27 DIAGNOSIS — Z01812 Encounter for preprocedural laboratory examination: Secondary | ICD-10-CM

## 2017-10-27 DIAGNOSIS — I25110 Atherosclerotic heart disease of native coronary artery with unstable angina pectoris: Secondary | ICD-10-CM

## 2017-10-27 LAB — BASIC METABOLIC PANEL
ANION GAP: 7 (ref 5–15)
BUN: 11 mg/dL (ref 6–20)
CO2: 27 mmol/L (ref 22–32)
Calcium: 8.8 mg/dL — ABNORMAL LOW (ref 8.9–10.3)
Chloride: 106 mmol/L (ref 98–111)
Creatinine, Ser: 1.01 mg/dL (ref 0.61–1.24)
Glucose, Bld: 143 mg/dL — ABNORMAL HIGH (ref 70–99)
POTASSIUM: 3.4 mmol/L — AB (ref 3.5–5.1)
Sodium: 140 mmol/L (ref 135–145)

## 2017-10-27 LAB — POCT INR: INR: 2.4 (ref 2.0–3.0)

## 2017-10-27 LAB — DIGOXIN LEVEL: Digoxin Level: 0.3 ng/mL — ABNORMAL LOW (ref 0.8–2.0)

## 2017-10-27 MED ORDER — CARVEDILOL 6.25 MG PO TABS: 6.2500 mg | ORAL_TABLET | Freq: Two times a day (BID) | ORAL | 3 refills | Status: DC

## 2017-10-27 MED ORDER — POTASSIUM CHLORIDE CRYS ER 20 MEQ PO TBCR: 20.0000 meq | EXTENDED_RELEASE_TABLET | Freq: Two times a day (BID) | ORAL | 3 refills | Status: DC

## 2017-10-27 MED ORDER — POTASSIUM CHLORIDE CRYS ER 20 MEQ PO TBCR: 20.0000 meq | EXTENDED_RELEASE_TABLET | Freq: Every day | ORAL | 3 refills | Status: DC

## 2017-10-27 MED FILL — POTASSIUM CL ER 20 MEQ TAB: 20 | 34 days supply | Qty: 34 | Fill #0

## 2017-10-27 NOTE — Progress Notes (Signed)
CSW met with patient in the clinic to review disability status. Patient's case is currently under review with the appeal process. Patient verbalizes understanding and has case # and number to follow up. CSW encouraged patient to attend the [employer] [employer] meeting today which he was agreeable and later shared he enjoyed the experience. CSW will continue to provide support and follow up with disability as needed. Raquel Sarna, [place], [place]

## 2017-10-27 NOTE — Progress Notes (Signed)
Electrophysiology Office Note   Date:  10/27/2017   ID:  Joshua May, DOB 03-05-1975, MRN [id number]  PCP:  System, Pcp Not In  Cardiologist:  Joshua May Primary Electrophysiologist:  Joshua Kerlin [name], MD    No chief complaint on file.    History of Present Illness: Joshua May is a 42 y.o. male who is being seen today for the evaluation of CHF at the request of Marca Ancona. Presenting today for electrophysiology evaluation.  History of coronary disease dating back to 2007 when he had PCI [place].  In 2010 he presented with STEMI and had a RCA drug-eluting stent placed.  He was lost to follow-up until October 2017 when he presented with a circumflex infarct.  Cath revealed 50% in-stent restenosis of the RCA and a total circumflex and 85% OM 3 occlusion.  He had PCI at that time.  His ejection fraction was 50 to 55%.  February 2019 he was admitted with dyspnea and chest pain.  CTA confirmed a PE.  Echo showed an ejection fraction of 20 to 25%.  CT surgery was consulted for severe mitral regurgitation.  The plan was to optimize his heart failure medications and set up for TEE as an outpatient.  On 06/2017 he was scheduled for mitral valve repair.    Today, he denies symptoms of palpitations, chest pain, shortness of breath, orthopnea, PND, lower extremity edema, claudication, dizziness, presyncope, syncope, bleeding, or neurologic sequela. The patient is tolerating medications without difficulties.    Past Medical History:  Diagnosis Date  . Asthma   . CAD (coronary artery disease)   . Chronic systolic CHF (congestive heart failure) (HCC)   . HLD (hyperlipidemia)    Qualifier: Diagnosis of  By: Antoine Poche, MD, Gerrit Heck    . Hypercholesteremia   . Inferior myocardial infarction [facility]) 2007   PCI of RCA  . Inferior myocardial infarction (HCC) 03/31/2008   PCI of RCA with bare metal stent  . Ischemic cardiomyopathy   . Migraines    and dizziness  . Mitral regurgitation   .  Obstructive sleep apnea    Qualifier: Diagnosis of  By: Antoine Poche, MD, Gerrit Heck    . 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 Pulmonary embolism (HCC) 02/28/2017  . 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 Tobacco abuse    Past Surgical History:  Procedure Laterality Date  . CARDIAC CATHETERIZATION N/A 10/20/2015   Procedure: Left Heart Cath and Coronary Angiography;  Surgeon: Kathleene Hazel, MD;  Location: [facility] INVASIVE CV LAB;  Service: Cardiovascular;  Laterality: N/A;  . CARDIAC CATHETERIZATION N/A 10/20/2015   Procedure: Coronary Stent Intervention;  Surgeon: Kathleene Hazel, MD;  Location: [facility] INVASIVE CV LAB;  Service: Cardiovascular;  Laterality: N/A;  . INTRAVASCULAR PRESSURE WIRE/FFR STUDY N/A 03/03/2017   Procedure: INTRAVASCULAR PRESSURE WIRE/FFR STUDY;  Surgeon: Marykay Lex, MD;  Location: [facility] INVASIVE CV LAB;  Service: Cardiovascular;  Laterality: N/A;  . LEFT HEART CATH AND CORONARY ANGIOGRAPHY N/A 03/03/2017   Procedure: LEFT HEART CATH AND CORONARY ANGIOGRAPHY;  Surgeon: Marykay Lex, MD;  Location: [facility] INVASIVE CV LAB;  Service: Cardiovascular;  Laterality: N/A;  . MITRAL VALVE REPAIR Right 07/07/2017   Procedure: MINIMALLY INVASIVE MITRAL VALVE REPAIR (MVR) using Carpentier-McCarthy Adams Ring size 26;  Surgeon: Purcell Nails, MD;  Location: [facility] OR;  Service: Open Heart Surgery;  Laterality: Right;  . MULTIPLE EXTRACTIONS WITH ALVEOLOPLASTY N/A 05/13/2017   Procedure: Extraction of tooth #'s 2,15,16,17 with alveoloplasty and gross debridement of remaining teeth.;  Surgeon: Charlynne Pander, DDS;  Location: [facility] OR;  Service: Oral Surgery;  Laterality: N/A;  . none    . OTHER SURGICAL HISTORY     NONE  . PATENT FORAMEN OVALE(PFO) CLOSURE N/A 07/07/2017   Procedure: PATENT FORAMEN OVALE (PFO) CLOSURE;  Surgeon: Purcell Nails, MD;  Location: [facility] OR;  Service:  Open Heart Surgery;  Laterality: N/A;  . RIGHT HEART CATH N/A 09/22/2017   Procedure: RIGHT HEART CATH;  Surgeon: Laurey Morale, MD;  Location: [facility] INVASIVE CV LAB;  Service: Cardiovascular;  Laterality: N/A;  . TEE WITHOUT CARDIOVERSION N/A 04/19/2017   Procedure: TRANSESOPHAGEAL ECHOCARDIOGRAM (TEE);  Surgeon: Laurey Morale, MD;  Location: [facility] ENDOSCOPY;  Service: Cardiovascular;  Laterality: N/A;  . TEE WITHOUT CARDIOVERSION N/A 07/07/2017   Procedure: TRANSESOPHAGEAL ECHOCARDIOGRAM (TEE);  Surgeon: Purcell Nails, MD;  Location: [facility] OR;  Service: Open Heart Surgery;  Laterality: N/A;     Current Outpatient Medications  Medication Sig Dispense Refill  . atorvastatin (LIPITOR) 80 MG tablet Take 1 tablet (80 mg total) by mouth daily. 30 tablet 2  . carvedilol (COREG) 6.25 MG tablet Take 1 tablet (6.25 mg total) by mouth 2 (two) times daily. 30 tablet 3  . digoxin (LANOXIN) 0.125 MG tablet Take 1 tablet (0.125 mg total) by mouth daily. 31 tablet 3  . ferrous sulfate 325 (65 FE) MG tablet Take 1 tablet (325 mg total) by mouth daily with breakfast. For one month then stop. 30 tablet 3  . folic acid (FOLVITE) 1 MG tablet Take 1 tablet (1 mg total) by mouth daily. For one month then stop.    . furosemide (LASIX) 40 MG tablet Take 1 tablet (40 mg total) by mouth daily. 34 tablet 3  . Meclizine HCl 25 MG CHEW Chew 1 tablet (25 mg total) by mouth daily as needed. 30 tablet 0  . potassium chloride SA (K-DUR,KLOR-CON) 20 MEQ tablet Take 1 tablet (20 mEq total) by mouth daily. 34 tablet 3  . sacubitril-valsartan (ENTRESTO) 24-26 MG Take 1 tablet by mouth 2 (two) times daily. 60 tablet 11  . spironolactone (ALDACTONE) 25 MG tablet Take 1 tablet (25 mg total) by mouth daily. Take in the AM 30 tablet 3  . warfarin (COUMADIN) 7.5 MG tablet Take 1/2 to 1 tablet daily as directed by Coumadin clinic 30 tablet 1   No current facility-administered medications for this visit.     Allergies:   Shellfish  allergy   Social History:  The patient  reports that he has quit smoking. His smoking use included cigarettes. He has a 8.50 pack-year smoking history. He has never used smokeless tobacco. He reports that he drank alcohol. He reports that he has current or past drug history. Drug: Marijuana.   Family History:  The patient's family history includes Deep vein thrombosis in his father; Heart attack in his father; Other (age of onset: 72) in his sister.    ROS:  Please see the history of present illness.   Otherwise, review of systems is positive for pain, palpitations, shortness of breath, snoring, depression, back pain, dizziness, headaches.   All other systems are reviewed and negative.    PHYSICAL EXAM: VS:  BP 110/62   Pulse 81   Ht 5\' 11"  (1.803 m)   Wt 263 lb (119.3 kg)   SpO2  98%   BMI 36.68 kg/m  , BMI Body mass index is 36.68 kg/m. GEN: Well nourished, well developed, in no acute distress  HEENT: normal  Neck: no JVD, carotid bruits, or masses Cardiac: RRR; no murmurs, rubs, or gallops,no edema  Respiratory:  clear to auscultation bilaterally, normal work of breathing GI: soft, nontender, nondistended, + BS MS: no deformity or atrophy  Skin: warm and dry Neuro:  Strength and sensation are intact Psych: euthymic mood, full affect  EKG:  EKG is not ordered today. Personal review of the ekg ordered 09/22/2017 shows sinus rhythm, inferior MI   Recent Labs: 07/05/2017: ALT 32 07/08/2017: Magnesium 2.2 07/11/2017: B Natriuretic Peptide 567.1 10/04/2017: Hemoglobin 12.6; Platelets 341 10/27/2017: BUN 11; Creatinine, Ser 1.01; Potassium 3.4; Sodium 140    Lipid Panel     Component Value Date/Time   CHOL 110 08/18/2017 1223   TRIG 140 08/18/2017 1223   HDL 31 (L) 08/18/2017 1223   CHOLHDL 3.5 08/18/2017 1223   VLDL 28 08/18/2017 1223   LDLCALC 51 08/18/2017 1223   LDLDIRECT 149.8 11/15/2009 0933     Wt Readings from Last 3 Encounters:  10/27/17 263 lb (119.3 kg)    10/27/17 265 lb 6.4 oz (120.4 kg)  10/04/17 261 lb 8 oz (118.6 kg)      Other studies Reviewed: Additional studies/ records that were reviewed today include: TTE 10/04/17  Review of the above records today demonstrates:  - Left ventricle: The cavity size was mildly dilated. Systolic   function was moderately to severely reduced. The estimated   ejection fraction was in the range of 30% to 35%. Diffuse   hypokinesis. - Aortic valve: There was no stenosis. - Mitral valve: Status post mitral valve repair. No significant   mitral stenosis. There was trivial regurgitation. Pressure   half-time: 104 ms. Mean gradient (D): 6 mm Hg. Valve area by   pressure half-time: 2.12 cm^2. - Right ventricle: The cavity size was normal. Systolic function   was normal. - Tricuspid valve: Peak RV-RA gradient (S): 28 mm Hg. - Pulmonary arteries: PA peak pressure: 31 mm Hg (S). - Inferior vena cava: The vessel was normal in size. The   respirophasic diameter changes were in the normal range (>= 50%),   consistent with normal central venous pressure.   ASSESSMENT AND PLAN:  1.  Chronic systolic heart failure due to ischemic cardiomyopathy: Ejection fraction 25 to 30%.  Has NYHA class III symptoms.  Currently on optimal medical therapy with Aldactone, Coreg, and Entresto.  He does qualify for an ICD.  His QRS is narrow and thus he does not need CRT.  Risks and benefits of ICD implant were discussed and include bleeding, tamponade, infection, and pneumothorax.  He understands these risks and is agreed to  2.  Coronary artery disease: Stents in the circumflex and RCA.  Left heart cath in 2019 with nonobstructive disease.  3.  Pulmonary embolism: Found in 2019 with no definitive trigger.  His father has a history of VTE.  Currently on Coumadin.  4.  Mitral regurgitation status post minimally invasive mitral valve repair 06/2017: Postop echo with no significant gradient.  Plan per primary  cardiology    Current medicines are reviewed at length with the patient today.   The patient does not have concerns regarding his medicines.  The following changes were made today:  none  Labs/ tests ordered today include:  Orders Placed This Encounter  Procedures  . Basic metabolic panel  .  CBC     Disposition:   FU with Falynn Ailey 3 months  Signed, Sequita Wise [name], MD  10/27/2017 2:43 PM     [facility] [address] [place] [address] [phone] (office) [phone] (fax)

## 2017-10-27 NOTE — Addendum Note (Signed)
Encounter addended by: Marcy Siren, LCSW on: 10/27/2017 1:05 PM  Actions taken: Sign clinical note

## 2017-10-27 NOTE — Patient Instructions (Signed)
Description   Continue taking 1/2 tablet daily except 1 tablet on Tuesdays, Thursdays, and Saturdays.  Recheck INR in 3 weeks. Call with any new medications or procedures [facility] [phone] Main [phone]

## 2017-10-27 NOTE — Progress Notes (Signed)
That is correct 

## 2017-10-27 NOTE — Patient Instructions (Signed)
Medication Instructions:  Your physician recommends that you continue on your current medications as directed. Please refer to the Current Medication list given to you today.  If you need a refill on your cardiac medications before your next appointment, please call your pharmacy.   Lab work: Pre procedure labs today: BMET & CBC If you have labs (blood work) drawn today and your tests are completely normal, you will receive your results only by: Marland Kitchen MyChart Message (if you have MyChart) OR . A paper copy in the mail If you have any lab test that is abnormal or we need to change your treatment, we will call you to review the results.  Testing/Procedures: Your physician has recommended that you have a defibrillator inserted. An implantable cardioverter defibrillator (ICD) is a small device that is placed in your chest or, in rare cases, your abdomen. This device uses electrical pulses or shocks to help control life-threatening, irregular heartbeats that could lead the heart to suddenly stop beating (sudden cardiac arrest). Leads are attached to the ICD that goes into your heart. This is done in the hospital and usually requires an overnight stay. Please see the instruction below under special instructions section.  Follow-Up: At [facility], you and your health needs are our priority.  As part of our continuing mission to provide you with exceptional heart care, we have created designated Provider Care Teams.  These Care Teams include your primary Cardiologist (physician) and Advanced Practice Providers (APPs -  Physician Assistants and Nurse Practitioners) who all work together to provide you with the care you need, when you need it. You will need a follow up appointment in 10-14 days, after your procedure on 11/18/17, with device clinic for a wound check. You will need a follow up appointment in 91 days, after your procedure on 11/18/2017, with Dr. Elberta Fortis  * Please note that any paperwork  needing to be filled out by the provider will need to be addressed at the front desk prior to seeing the provider.  Please note that any FMLA, disability or other documents regarding health condition is subject to a $25.00 charge that must be received prior to completion of paperwork in the form of a money order or check. *  Thank you for choosing [facility]!!   Dory Horn, RN [phone]  Any Other Special Instructions Will Be Listed Below (If Applicable).     Implantable Device Instructions  You are scheduled for:                  _____ Implantable Cardioverter Defibrillator  on  11/18/2017  with Dr. Elberta Fortis.  1.   Please arrive at the [facility], Entrance "A"  at [facility] at  5:30 a.m. on the day of your procedure. (The address is [address])  2. Do not eat or drink after midnight the night before your procedure.  3.   Complete pre procedure  lab work on 10/27/17.  The lab at [facility] is open from 8:00 AM to 4:30 PM.  You do not have to be fasting.  4.   Hold all of your morning medications the morning of your procedure.  5.  Plan for an overnight stay.  Bring your insurance cards and a list of you medications.  6.  Wash your chest and neck with surgical scrub the evening before and the morning of  your procedure.  Rinse well. Please review the surgical scrub instruction sheet given  to you.  7. Your chest will need to be shaved prior to this procedure (if needed). We ask that you do this yourself at home 1 to 2 days before or if uncomfortable/unable to do yourself, then it will be performed by the hospital staff the day of.                                                                                                                * If you have ANY questions after you get home, please call Dory Horn, RN @ [phone].  * Every attempt is made to prevent procedures from being rescheduled.  Due to the nature of   Electrophysiology, rescheduling can happen.  The physician is always aware and directs the staff when this occurs.        Cardioverter Defibrillator Implantation An implantable cardioverter defibrillator (ICD) is a small, lightweight, battery-powered device that is placed (implanted) under the skin in the chest or abdomen. Your caregiver may prescribe an ICD if:  You have had an irregular heart rhythm (arrhythmia) that originated in the lower chambers of the heart (ventricles).  Your heart has been damaged by a disease (such as coronary artery disease) or heart condition (such as a heart attack). An ICD consists of a battery that lasts several years, a small computer called a pulse generator, and wires called leads that go into the heart. It is used to detect and correct two dangerous arrhythmias: a rapid heart rhythm (tachycardia) and an arrhythmia in which the ventricles contract in an uncoordinated way (fibrillation). When an ICD detects tachycardia, it sends an electrical signal to the heart that restores the heartbeat to normal (cardioversion). This signal is usually painless. If cardioversion does not work or if the ICD detects fibrillation, it delivers a small electrical shock to the heart (defibrillation) to restart the heart. The shock may feel like a strong jolt in the chest. ICDs may be programmed to correct other problems. Sometimes, ICDs are programmed to act as another type of implantable device called a pacemaker. Pacemakers are used to treat a slow heartbeat (bradycardia). LET YOUR CAREGIVER KNOW ABOUT:  Any allergies you have.  All medicines you are taking, including vitamins, herbs, eyedrops, and over-the-counter medicines and creams.  Previous problems you or members of your family have had with the use of anesthetics.  Any blood disorders you have had.  Other health problems you have. RISKS AND COMPLICATIONS Generally, the procedure to implant an ICD is safe. However,  as with any surgical procedure, complications can occur. Possible complications associated with implanting an ICD include:  Swelling, bleeding, or bruising at the site where the ICD was implanted.  Infection at the site where the ICD was implanted.  A reaction to medicine used during the procedure.  Nerve, heart, or blood vessel damage.  Blood clots. BEFORE THE PROCEDURE  You may need to have blood tests, heart tests, or a chest X-ray done before the day of the procedure.  Ask your caregiver about changing or  stopping your regular medicines.  Make plans to have someone drive you home. You may need to stay in the hospital overnight after the procedure.  Stop smoking at least 24 hours before the procedure.  Take a bath or shower the night before the procedure. You may need to scrub your chest or abdomen with a special type of soap.  Do not eat or drink before your procedure for as long as directed by your caregiver. Ask if it is okay to take any needed medicine with a small sip of water. PROCEDURE  The procedure to implant an ICD in your chest or abdomen is usually done at a hospital in a room that has a large X-ray machine called a fluoroscope. The machine will be above you during the procedure. It will help your caregiver see your heart during the procedure. Implanting an ICD usually takes 1-3 hours. Before the procedure:   Small monitors will be put on your body. They will be used to check your heart, blood pressure, and oxygen level.  A needle will be put into a vein in your hand or arm. This is called an intravenous (IV) access tube. Fluids and medicine will flow directly into your body through the IV tube.  Your chest or abdomen will be cleaned with a germ-killing (antiseptic) solution. The area may be shaved.  You may be given medicine to help you relax (sedative).  You will be given a medicine called a local anesthetic. This medicine will make the surgical site numb while the  ICD is implanted. You will be sleepy but awake during the procedure. After you are numb the procedure will begin. The caregiver will:  Make a small cut (incision). This will make a pocket deep under your skin that will hold the pulse generator.  Guide the leads through a large blood vessel into your heart and attach them to the heart muscles. Depending on the ICD, the leads may go into one ventricle or they may go to both ventricles and into an upper chamber of the heart (atrium).  Test the ICD.  Close the incision with stitches, glue, or staples. AFTER THE PROCEDURE  You may feel pain. Some pain is normal. It may last a few days.  You may stay in a recovery area until the local anesthetic has worn off. Your blood pressure and pulse will be checked often. You will be taken to a room where your heart will be monitored.  A chest X-ray will be taken. This is done to check that the cardioverter defibrillator is in the right place.  You may stay in the hospital overnight.  A slight bump may be seen over the skin where the ICD was placed. Sometimes, it is possible to feel the ICD under the skin. This is normal.  In the months and years afterward, your caregiver will check the device, the leads, and the battery every few months. Eventually, when the battery is low, the ICD will be replaced.   This information is not intended to replace advice given to you by your health care provider. Make sure you discuss any questions you have with your health care provider.   Document Released: 09/27/2001 Document Revised: 10/26/2012 Document Reviewed: 01/25/2012 Elsevier Interactive Patient Education 2016 [employer]    Cardioverter Defibrillator Implantation, Care After This sheet gives you information about how to care for yourself after your procedure. Your health care provider may also give you more specific instructions. If you have problems or questions,  contact your health care provider. What  can I expect after the procedure? After the procedure, it is common to have:  Some pain. It may last a few days.  A slight bump over the skin where the device was placed. Sometimes, it is possible to feel the device under the skin. This is normal.  During the months and years after your procedure, your health care provider will check the device, the leads, and the battery every few months. Eventually, when the battery is low, the device will be replaced. Follow these instructions at home: Medicines  Take over-the-counter and prescription medicines only as told by your health care provider.  If you were prescribed an antibiotic medicine, take it as told by your health care provider. Do not stop taking the antibiotic even if you start to feel better. Incision care   Follow instructions from your health care provider about how to take care of your incision area. Make sure you: ? Wash your hands with soap and water before you change your bandage (dressing). If soap and water are not available, use hand sanitizer. ? Change your dressing as told by your health care provider. ? Leave stitches (sutures), skin glue, or adhesive strips in place. These skin closures may need to stay in place for 2 weeks or longer. If adhesive strip edges start to loosen and curl up, you may trim the loose edges. Do not remove adhesive strips completely unless your health care provider tells you to do that.  Check your incision area every day for signs of infection. Check for: ? More redness, swelling, or pain. ? More fluid or blood. ? Warmth. ? Pus or a bad smell.  Do not use lotions or ointments near the incision area unless told by your health care provider.  Keep the incision area clean and dry for 2-3 days after the procedure or for as long as told by your health care provider. It takes several weeks for the incision site to heal completely.  Do not take baths, swim, or use a hot tub until your health care  provider approves. Activity  Try to walk a little every day. Exercising is important after this procedure. Also, use your shoulder on the side of the defibrillator in daily tasks that do not require a lot of motion.  For at least 6 weeks: ? Do not lift your upper arm above your shoulders. This means no tennis, golf, or swimming for this period of time. If you tend to sleep with your arm above your head, use a restraint to prevent this during sleep. ? Avoid sudden jerking, pulling, or chopping movements that pull your upper arm far away from your body.  Ask your health care provider when you may go back to work.  Check with your health care provider before you start to drive or play sports. Electric and magnetic fields  Tell all health care providers that you have a defibrillator. This may prevent them from giving you an MRI scan because strong magnets are used for that test.  If you must pass through a metal detector, quickly walk through it. Do not stop under the detector, and do not stand near it.  Avoid places or objects that have a strong electric or magnetic field, including: ? [employer]. At the airport, let officials know that you have a defibrillator. Your defibrillator ID card will let you be checked in a way that is safe for you and will not damage your defibrillator.  Also, do not let a security person wave a magnetic wand near your defibrillator. That can make it stop working. ? Power plants. ? Large electrical generators. ? Anti-theft systems or electronic article surveillance (EAS). ? Radiofrequency transmission towers, such as cell phone and radio towers.  Do not use amateur (ham) radio equipment or electric (arc) welding torches. Some devices are safe to use if held at least 12 inches (30 cm) from your defibrillator. These include power tools, lawn mowers, and speakers. If you are unsure if something is safe to use, ask your health care provider.  Do not use MP3  player headphones. They have magnets.  You may safely use electric blankets, heating pads, computers, and microwave ovens.  When using your cell phone, hold it to the ear that is on the opposite side from the defibrillator. Do not leave your cell phone in a pocket over the defibrillator. General instructions  Follow diet instructions from your health care provider, if this applies.  Always keep your defibrillator ID card with you. The card should list the implant date, device model, and manufacturer. Consider wearing a medical alert bracelet or necklace.  Have your defibrillator checked every 3-6 months or as often as told by your health care provider. Most defibrillators last for 4-8 years.  Keep all follow-up visits as told by your health care provider. This is important for your health care provider to make sure your chest is healing the way it should. Ask your health care provider when you should come back to have your stitches or staples taken out. Contact a health care provider if:  You feel one shock in your chest.  You gain weight suddenly.  Your legs or feet swell more than they have before.  It feels like your heart is fluttering or skipping beats (heart palpitations).  You have more redness, swelling, or pain around your incision.  You have more fluid or blood coming from your incision.  Your incision feels warm to the touch.  You have pus or a bad smell coming from your incision.  You have a fever. Get help right away if:  You have chest pain.  You feel more than one shock.  You feel more short of breath than you have felt before.  You feel more light-headed than you have felt before.  Your incision starts to open up. This information is not intended to replace advice given to you by your health care provider. Make sure you discuss any questions you have with your health care provider. Document Released: 07/25/2004 Document Revised: 07/26/2015 Document  Reviewed: 06/12/2015 Elsevier Interactive Patient Education  2018 [employer].     Supplemental Discharge Instructions for  Pacemaker/Defibrillator Patients  ACTIVITY No heavy lifting or vigorous activity with your left/right arm for 6 to 8 weeks.  Do not raise your left/right arm above your head for one week.  Gradually raise your affected arm as drawn below.           __  NO DRIVING for     ; you may begin driving on     .  WOUND CARE - Keep the wound area clean and dry.  Do not get this area wet for one week. No showers for one week; you may shower on     . - The tape/steri-strips on your wound will fall off; do not pull them off.  No bandage is needed on the site.  DO  NOT apply any creams, oils, or ointments  to the wound area. - If you notice any drainage or discharge from the wound, any swelling or bruising at the site, or you develop a fever > 101? F after you are discharged home, call the office at once.  SPECIAL INSTRUCTIONS - You are still able to use cellular telephones; use the ear opposite the side where you have your pacemaker/defibrillator.  Avoid carrying your cellular phone near your device. - When traveling through airports, show security personnel your identification card to avoid being screened in the metal detectors.  Ask the security personnel to use the hand wand. - Avoid arc welding equipment, MRI testing (magnetic resonance imaging), TENS units (transcutaneous nerve stimulators).  Call the office for questions about other devices. - Avoid electrical appliances that are in poor condition or are not properly grounded. - Microwave ovens are safe to be near or to operate.  ADDITIONAL INFORMATION FOR DEFIBRILLATOR PATIENTS SHOULD YOUR DEVICE GO OFF: - If your device goes off ONCE and you feel fine afterward, notify the device clinic nurses. - If your device goes off ONCE and you do not feel well afterward, call 911. - If your device goes off TWICE, call  911. - If your device goes off THREE TIMES IN ONE DAY, call 911.  DO NOT DRIVE YOURSELF OR A FAMILY MEMBER WITH A DEFIBRILLATOR TO THE HOSPITAL-CALL 911.

## 2017-10-27 NOTE — Addendum Note (Signed)
Addended by: Tonita Phoenix on: 10/27/2017 03:13 PM   Modules accepted: Orders

## 2017-10-27 NOTE — Telephone Encounter (Signed)
Pt called with lab results and new order Please have take 40 meq K x 1, and then start K 20 meq daily. Pt verbalized understanding

## 2017-10-27 NOTE — Patient Instructions (Signed)
Today you have been seen at the Heart failure clinic at [facility]   Medication changes: take Coreg 3.125mg  in the AM and 6.25mg  in the PM for one week then start 6.25mg  morning and afternoon if able to tolerate.   Lab work done today: BMET, Digoxin    Follow up with Dr.McLean on 11/19/17 at 9:20 am

## 2017-10-27 NOTE — Progress Notes (Signed)
PCP: None  HF Cardiology: Dr Shirlee Latch  CT Surgery: Dr Cornelius Moras  HPI: Joshua May is a 42 y.o. [name] male with a history of CAD dating back to 2007 when he had a PCI in [place]. In 2010 he presented with a STEMI and had an RCA DES placed. He followed up for a year but then didn't present again till Oct 2017 when he presented with a CFX infarct. Cath revealed 50% ISR of the RCA, total mCFX and 85% OM3. He had CFX PCI with DES and OM3 POBA. His EF then was 50-55%.  Admitted 02/27/17 with increased dyspnea and chest pain. CTA confirmed PE. ECHO completed and showed reduced EF 20-25%. Underwent LHC as noted below. [place] no evidence DVTs. CT surgery consulted for severe MR. Plan to optimize HF medications.Set up TEE as an outpatient. Discharge weight 222 pounds.   Admitted 06/2017 with scheduled MV repair. S/P Minimally invasive mitral valve repair on 6/19. ECHO 07/12/17 post surgery, no MR was noted but EF remained 25-30%. Tachycardic at the time of discharge. Discharge weight 257 pounds.   CPX was submaximal in 8/19, suggestive of marked deconditioning.  RHC in 9/19 showed relatively preserved cardiac index with only mildly elevated filling pressures.  Echo was done today and reviewed, EF 30-35%.   He presents today for regular follow up. Feeling good overall, but remains fatigued with exertion. Still working on getting Medicaid , HFCSW to see today. He can walk about 10 minutes without SOB. Continues to have bendopnea and orthopnea with 3 pillows chronically. Denies chest pain. He does have numbness/tingling and sometimes burning pain at this chest and groin surgical sites. No bleeding or discharge, seems to be improving as he gets further from surgery. Sees EP today.   Labs (7/19): K 4.5, creatinine 0.95, LDL 51 Labs (8/19): K 3.8, creatinine 0.99  PMH: 1. CAD: Initial PCI in 2007 in [place], inferior MI.   - Inferior STEMI 2010 with RCA PCI.  - CFx infarct in 2017 with LCx DES and OM3 POBA.  - LHC (2/19):  Nonobstructive CAD.  2. 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  - TEE (4/19): LVEF 35-40%, Severe MR with PISA ERO 0.43 cm^2. Suspect infarct related MR with tethering of the posterior leaflet.  - Minimally invasive MV repair in 6/19.   3. Chronic systolic CHF: Ischemic cardiomyopathy.  - Echo (2/19): EF 25-30%, diffuse HK with inferolateral AK, mildly decreased RV systolic function with D-shaped septum, moderate-severe infarct-related MR.  - Echo (6/19): EF 25-30%, apical and inferolateral hypokinesis, MV repair with mean gradient 9 mmHg with no MR, mildly decreased RV systolic function.  - CPX (8/19): VO2 13.2, VE/VCO2 slope 28, RER 0.87.  This study was suggestive of severe deconditioning.  - RHC (9/19): mean RA 9, PA 39/23 mean 24, mean PCWP 17, CI 2.2, PVR 1.35 WU - Echo (9/19): EF 30-35%, diffuse hypokinesis, normal RV, s/p MV repair with mean gradient 6 mmHg and PHT 104 msec => probably no significant stenosis.  4. Hyperlipidemia 5. PE: 2/19.   Review of systems complete and found to be negative unless listed in HPI.    Social History   Socioeconomic History  . Marital status: Married    Spouse name: Not on file  . Number of children: Not on file  . Years of education: Not on file  . Highest education level: Not on file  Occupational History  . Not on file  Social Needs  . Financial resource strain: Not on file  .  Food insecurity:    Worry: Not on file    Inability: Not on file  . Transportation needs:    Medical: Not on file    Non-medical: Not on file  Tobacco Use  . Smoking status: Former Smoker    Packs/day: 0.50    Years: 17.00    Pack years: 8.50    Types: Cigarettes  . Smokeless tobacco: Never Used  . Tobacco comment: quit since heart attack in 10/2015  Substance and Sexual Activity  . Alcohol use: Not Currently  . Drug use: Not Currently    Types: Marijuana  . Sexual activity: Not on file  Lifestyle  . Physical activity:    Days per week: Not on file      Minutes per session: Not on file  . Stress: Not on file  Relationships  . Social connections:    Talks on phone: Not on file    Gets together: Not on file    Attends religious service: Not on file    Active member of club or organization: Not on file    Attends meetings of clubs or organizations: Not on file    Relationship status: Not on file  . Intimate partner violence:    Fear of current or ex partner: Not on file    Emotionally abused: Not on file    Physically abused: Not on file    Forced sexual activity: Not on file  Other Topics Concern  . Not on file  Social History Narrative   The patient is married with two children.   Family History  Problem Relation Age of Onset  . Other Sister 71       THE PATIENT REPORTS A SISTER WITH A MYOCARDIAL INFARCTION IN HER 30s.(SHE IS NOW S/P CABG)  . Heart attack Father   . Deep vein thrombosis Father     Current Outpatient Medications  Medication Sig Dispense Refill  . atorvastatin (LIPITOR) 80 MG tablet Take 1 tablet (80 mg total) by mouth daily. 30 tablet 2  . carvedilol (COREG) 3.125 MG tablet Take 1 tablet (3.125 mg total) by mouth 2 (two) times daily. 60 tablet 3  . digoxin (LANOXIN) 0.125 MG tablet Take 1 tablet (0.125 mg total) by mouth daily. 31 tablet 3  . ferrous sulfate 325 (65 FE) MG tablet Take 1 tablet (325 mg total) by mouth daily with breakfast. For one month then stop. 30 tablet 3  . folic acid (FOLVITE) 1 MG tablet Take 1 tablet (1 mg total) by mouth daily. For one month then stop.    . furosemide (LASIX) 40 MG tablet Take 1 tablet (40 mg total) by mouth daily. 34 tablet 3  . sacubitril-valsartan (ENTRESTO) 24-26 MG Take 1 tablet by mouth 2 (two) times daily. 60 tablet 11  . spironolactone (ALDACTONE) 25 MG tablet Take 1 tablet (25 mg total) by mouth daily. Take in the AM 30 tablet 3  . warfarin (COUMADIN) 7.5 MG tablet Take 1/2 to 1 tablet daily as directed by Coumadin clinic 30 tablet 1  . Meclizine HCl 25 MG  CHEW Chew 1 tablet (25 mg total) by mouth daily as needed. (Patient not taking: Reported on 10/27/2017) 30 tablet 0   No current facility-administered medications for this encounter.    Vitals:   10/27/17 0907  BP: 134/82  Pulse: 79  SpO2: 97%  Weight: 120.4 kg (265 lb 6.4 oz)    Wt Readings from Last 3 Encounters:  10/27/17 120.4 kg (265 lb  6.4 oz)  10/04/17 118.6 kg (261 lb 8 oz)  09/27/17 117.5 kg (259 lb)    PHYSICAL EXAM: General: Well appearing. No resp difficulty. HEENT: Normal Neck: Supple. JVP 5-6. Carotids 2+ bilat; no bruits. No thyromegaly or nodule noted. Cor: PMI nondisplaced. RRR, No M/G/R noted Lungs: CTAB, normal effort. Abdomen: Soft, non-tender, non-distended, no HSM. No bruits or masses. +BS  Extremities: No cyanosis, clubbing, or rash. R and LLE no edema.  Neuro: Alert & orientedx3, cranial nerves grossly intact. moves all 4 extremities w/o difficulty. Affect pleasant   ASSESSMENT & PLAN: 1. Chronic systolic CHF: - Ischemic cardiomyopathy. Echo in 6/19 with EF 25-30%, echo done today with EF 30-35%.  He has NYHA class III symptoms.  RHC in 9/19 showed only mild fluid overload and cardiac index was relatively preserved at 2.2. CPX showed marked deconditioning.   - Continue digoxin 0.125.  Check level today with recent decrease.  - Continue spironolactone 25 mg daily.   - Increase coreg to 3.125 mg q am and 6.25 mg q pm, if tolerates, can go up to 6.25 mg BID.  - Continue Entresto 24/26 mg BID.  - Continue Lasix 40 mg daily. - EF remains low, he qualifies for ICD. Needs to get Medicaid coverage prior to this (working on [place] currently). Narrow QRS, not candidate for CRT. Sees Dr. Elberta Fortis today.  - He may benefit from barostimulator device, he is being evaluated for BEAT-HF.  2. CAD:  - Stents in LCx and RCA. LHC 02/2017 showed nonobstructive disease.  - No s/s of ischemia.    - Continue atorvastatin, good lipids 7/19. - He is on warfarin so no ASA 81.    3. PE:  - In 2019, no definite trigger. Father with h/o VTE.  He should likely have long-term anticoagulation.   - Continue coumadin. Denies bleeding.  4. Mitral regurgitation s/p minimally invasive MV repair in 6/19.  - Post-op echo 6/19 showed no MR but mean gradient 9 mmHg across MV. Echo (9/19) with mean MV gradient 6 mmHg but PHT short at 104 msec, probably no significant stenosis.  5. Former smoker:  - Encouraged continued abstinence.  6. PAD:  - Patient has frequent leg pain, difficult to palpate pedal pulses.  - Needs peripheral arterial doppler evaluation to assess for PAD.  7. Palpitations - Not associated with SOB, CP, or lightheadedness. No syncope/near syncope - With depressed EF, will place Zio patch if eligible. Pt to call [phone] number to see if fully covered with his family size and household income.  8. Neuropathy - Have nerve type pain at chest and groin surgical sites. He refuses gabapentin at this time.  [facility] will continue to improve as he becomes more active.   Keep 3 week appt with Dr. Loralie Champagne, PA-C  10/27/2017   Greater than 50% of the 25 minute visit was spent in counseling/coordination of care regarding disease state education, salt/fluid restriction, sliding scale diuretics, and medication compliance.

## 2017-10-28 LAB — CBC
HEMOGLOBIN: 13.1 g/dL (ref 13.0–17.7)
Hematocrit: 39 % (ref 37.5–51.0)
MCH: 29.4 pg (ref 26.6–33.0)
MCHC: 33.6 g/dL (ref 31.5–35.7)
MCV: 87 fL (ref 79–97)
PLATELETS: 363 10*3/uL (ref 150–450)
RBC: 4.46 x10E6/uL (ref 4.14–5.80)
RDW: 14.3 % (ref 12.3–15.4)
WBC: 8.4 10*3/uL (ref 3.4–10.8)

## 2017-10-28 LAB — BASIC METABOLIC PANEL
BUN/Creatinine Ratio: 14 (ref 9–20)
BUN: 12 mg/dL (ref 6–24)
CHLORIDE: 104 mmol/L (ref 96–106)
CO2: 22 mmol/L (ref 20–29)
CREATININE: 0.87 mg/dL (ref 0.76–1.27)
Calcium: 9 mg/dL (ref 8.7–10.2)
GFR calc Af Amer: 125 mL/min/{1.73_m2} (ref >59)
GFR calc non Af Amer: 108 mL/min/{1.73_m2} (ref >59)
GLUCOSE: 100 mg/dL — AB (ref 65–99)
POTASSIUM: 3.9 mmol/L (ref 3.5–5.2)
SODIUM: 143 mmol/L (ref 134–144)

## 2017-11-04 DIAGNOSIS — Z736 Limitation of activities due to disability: Secondary | ICD-10-CM

## 2017-11-12 ENCOUNTER — Telehealth: Payer: Self-pay | Admitting: *Deleted

## 2017-11-12 MED ORDER — PREDNISONE 50 MG PO TABS: ORAL_TABLET | ORAL | 0 refills | Status: DC

## 2017-11-12 NOTE — Telephone Encounter (Signed)
Spoke with patient about BEAT HF.  He is getting an ICD placed on the 11/18/17.  Told patient that I would reach out to him end of December about the Beat HF study, to see if he is still interested.  Thanked him for his interest.

## 2017-11-12 NOTE — Telephone Encounter (Signed)
Went over pre med instructions w/ patient. Pt advised to take  Prednisone 50 mg 13 hours, 7 hours & 1 hour prior to his ICD implant on 10/31. Advised to also take Benadryl 50 mg 1 hour prior (with his last Prednisone dose).  Instructions reviewed w/ wife. She is agreeable to plan.

## 2017-11-15 ENCOUNTER — Telehealth (HOSPITAL_COMMUNITY): Payer: Self-pay

## 2017-11-15 MED FILL — DIGOXIN 0.125 MG TABLET: 125 | 31 days supply | Qty: 31 | Fill #1

## 2017-11-15 MED FILL — ATORVASTATIN 80 MG TABLET: 80 | 30 days supply | Qty: 30 | Fill #1

## 2017-11-15 MED FILL — FUROSEMIDE 40 MG TAB: 40 | 34 days supply | Qty: 34 | Fill #1

## 2017-11-15 MED FILL — CARVEDILOL 3.125 MG TABLET: 3.125 | 30 days supply | Qty: 60 | Fill #3

## 2017-11-15 MED FILL — SPIRONOLACTONE 25 MG TABLET: 25 | 30 days supply | Qty: 30 | Fill #2

## 2017-11-15 NOTE — Telephone Encounter (Signed)
Spoke to pt's wife per his request. She was unaware his Rx's were ready for pickup at the pharmacy.  He denies any new side effects. He reported mild SOB when he's walks and his heart races sometimes. Confirmed not taking losartan since starting Entresto.   Patient denies change in weight/swelling. He reports doing well with limiting fluids/salt.   Mirna Mires PharmD Candidate [employer] Class of [phone]

## 2017-11-18 ENCOUNTER — Encounter (HOSPITAL_COMMUNITY)
Admission: RE | Disposition: A | Discharge status: Home / Self Care | Payer: Self-pay | Source: Ambulatory Visit | Attending: Cardiology

## 2017-11-18 ENCOUNTER — Ambulatory Visit (HOSPITAL_COMMUNITY)
Admission: RE | Admit: 2017-11-18 | Discharge: 2017-11-19 | Disposition: A | Discharge status: Home / Self Care | Payer: Medicaid Other | Source: Ambulatory Visit | Attending: Cardiology | Admitting: Cardiology

## 2017-11-18 ENCOUNTER — Other Ambulatory Visit: Payer: Self-pay

## 2017-11-18 ENCOUNTER — Encounter (HOSPITAL_COMMUNITY): Payer: Self-pay | Admitting: Cardiology

## 2017-11-18 DIAGNOSIS — E785 Hyperlipidemia, unspecified: Secondary | ICD-10-CM | POA: Diagnosis not present

## 2017-11-18 DIAGNOSIS — Z91013 Allergy to seafood: Secondary | ICD-10-CM | POA: Diagnosis not present

## 2017-11-18 DIAGNOSIS — Z8249 Family history of ischemic heart disease and other diseases of the circulatory system: Secondary | ICD-10-CM | POA: Diagnosis not present

## 2017-11-18 DIAGNOSIS — Z9889 Other specified postprocedural states: Secondary | ICD-10-CM | POA: Diagnosis not present

## 2017-11-18 DIAGNOSIS — Z95818 Presence of other cardiac implants and grafts: Secondary | ICD-10-CM

## 2017-11-18 DIAGNOSIS — Z955 Presence of coronary angioplasty implant and graft: Secondary | ICD-10-CM | POA: Diagnosis not present

## 2017-11-18 DIAGNOSIS — I252 Old myocardial infarction: Secondary | ICD-10-CM | POA: Insufficient documentation

## 2017-11-18 DIAGNOSIS — Z86711 Personal history of pulmonary embolism: Secondary | ICD-10-CM | POA: Diagnosis not present

## 2017-11-18 DIAGNOSIS — G4733 Obstructive sleep apnea (adult) (pediatric): Secondary | ICD-10-CM | POA: Diagnosis not present

## 2017-11-18 DIAGNOSIS — Z832 Family history of diseases of the blood and blood-forming organs and certain disorders involving the immune mechanism: Secondary | ICD-10-CM | POA: Diagnosis not present

## 2017-11-18 DIAGNOSIS — Z87891 Personal history of nicotine dependence: Secondary | ICD-10-CM | POA: Diagnosis not present

## 2017-11-18 DIAGNOSIS — Z006 Encounter for examination for normal comparison and control in clinical research program: Secondary | ICD-10-CM | POA: Diagnosis not present

## 2017-11-18 DIAGNOSIS — I2510 Atherosclerotic heart disease of native coronary artery without angina pectoris: Secondary | ICD-10-CM | POA: Insufficient documentation

## 2017-11-18 DIAGNOSIS — Z7901 Long term (current) use of anticoagulants: Secondary | ICD-10-CM | POA: Insufficient documentation

## 2017-11-18 DIAGNOSIS — Z8679 Personal history of other diseases of the circulatory system: Secondary | ICD-10-CM | POA: Diagnosis not present

## 2017-11-18 DIAGNOSIS — I255 Ischemic cardiomyopathy: Secondary | ICD-10-CM | POA: Diagnosis not present

## 2017-11-18 DIAGNOSIS — I5022 Chronic systolic (congestive) heart failure: Secondary | ICD-10-CM | POA: Diagnosis not present

## 2017-11-18 DIAGNOSIS — Z79899 Other long term (current) drug therapy: Secondary | ICD-10-CM | POA: Insufficient documentation

## 2017-11-18 DIAGNOSIS — J9811 Atelectasis: Secondary | ICD-10-CM | POA: Insufficient documentation

## 2017-11-18 HISTORY — PX: ICD IMPLANT: EP1208

## 2017-11-18 LAB — SURGICAL PCR SCREEN
MRSA, PCR: NEGATIVE
Staphylococcus aureus: NEGATIVE

## 2017-11-18 LAB — PROTIME-INR
INR: 2.13
PROTHROMBIN TIME: 23.6 s — AB (ref 11.4–15.2)

## 2017-11-18 SURGERY — ICD IMPLANT

## 2017-11-18 MED ORDER — WARFARIN - PHYSICIAN DOSING INPATIENT
Freq: Every day | Status: DC
  Administered 2017-11-18: 18:00:00

## 2017-11-18 MED ORDER — WARFARIN SODIUM 2.5 MG PO TABS: 3.7500 mg | ORAL_TABLET | ORAL | Status: DC

## 2017-11-18 MED ORDER — DIGOXIN 125 MCG PO TABS
0.1250 mg | ORAL_TABLET | Freq: Every day | ORAL | Status: DC
  Administered 2017-11-18 – 2017-11-19 (×2): 0.125 mg via ORAL
  Filled 2017-11-18 (×2): qty 1

## 2017-11-18 MED ORDER — LIDOCAINE HCL (PF) 1 % IJ SOLN
INTRAMUSCULAR | Status: AC
  Filled 2017-11-18: qty 30

## 2017-11-18 MED ORDER — ACETAMINOPHEN 325 MG PO TABS
325.0000 mg | ORAL_TABLET | ORAL | Status: DC | PRN
  Administered 2017-11-18 – 2017-11-19 (×2): 650 mg via ORAL
  Filled 2017-11-18 (×2): qty 2

## 2017-11-18 MED ORDER — FOLIC ACID 1 MG PO TABS
0.5000 mg | ORAL_TABLET | Freq: Every day | ORAL | Status: DC
  Administered 2017-11-19: 0.5 mg via ORAL
  Filled 2017-11-18 (×6): qty 1

## 2017-11-18 MED ORDER — SODIUM CHLORIDE 0.9 % IV SOLN
INTRAVENOUS | Status: DC
  Administered 2017-11-18: 07:00:00 via INTRAVENOUS

## 2017-11-18 MED ORDER — CEFAZOLIN SODIUM-DEXTROSE 2-4 GM/100ML-% IV SOLN
INTRAVENOUS | Status: AC
  Filled 2017-11-18: qty 100

## 2017-11-18 MED ORDER — MIDAZOLAM HCL 5 MG/5ML IJ SOLN
INTRAMUSCULAR | Status: AC
  Filled 2017-11-18: qty 5

## 2017-11-18 MED ORDER — MIDAZOLAM HCL 5 MG/5ML IJ SOLN
INTRAMUSCULAR | Status: DC | PRN
  Administered 2017-11-18: 1 mg via INTRAVENOUS
  Administered 2017-11-18: 2 mg via INTRAVENOUS

## 2017-11-18 MED ORDER — MUPIROCIN 2 % EX OINT
TOPICAL_OINTMENT | CUTANEOUS | Status: AC
  Filled 2017-11-18: qty 22

## 2017-11-18 MED ORDER — MECLIZINE HCL 25 MG PO TABS: 25.0000 mg | ORAL_TABLET | Freq: Every day | ORAL | Status: DC | PRN

## 2017-11-18 MED ORDER — CEFAZOLIN SODIUM-DEXTROSE 2-4 GM/100ML-% IV SOLN
2.0000 g | INTRAVENOUS | Status: AC
  Administered 2017-11-18: 2 g via INTRAVENOUS
  Filled 2017-11-18: qty 1200

## 2017-11-18 MED ORDER — ATORVASTATIN CALCIUM 80 MG PO TABS
80.0000 mg | ORAL_TABLET | Freq: Every day | ORAL | Status: DC
  Administered 2017-11-18 – 2017-11-19 (×2): 80 mg via ORAL
  Filled 2017-11-18 (×2): qty 1

## 2017-11-18 MED ORDER — CARVEDILOL 6.25 MG PO TABS
6.2500 mg | ORAL_TABLET | Freq: Every evening | ORAL | Status: DC
  Administered 2017-11-18: 6.25 mg via ORAL
  Filled 2017-11-18: qty 1

## 2017-11-18 MED ORDER — HEPARIN (PORCINE) IN NACL 1000-0.9 UT/500ML-% IV SOLN
INTRAVENOUS | Status: AC
  Filled 2017-11-18: qty 500

## 2017-11-18 MED ORDER — LIDOCAINE HCL (PF) 1 % IJ SOLN
INTRAMUSCULAR | Status: DC | PRN
  Administered 2017-11-18: 80 mL via SUBCUTANEOUS

## 2017-11-18 MED ORDER — POTASSIUM CHLORIDE CRYS ER 20 MEQ PO TBCR
20.0000 meq | EXTENDED_RELEASE_TABLET | Freq: Every day | ORAL | Status: DC
  Administered 2017-11-18 – 2017-11-19 (×2): 20 meq via ORAL
  Filled 2017-11-18 (×2): qty 1

## 2017-11-18 MED ORDER — SACUBITRIL-VALSARTAN 24-26 MG PO TABS
1.0000 | ORAL_TABLET | Freq: Two times a day (BID) | ORAL | Status: DC
  Administered 2017-11-18 – 2017-11-19 (×3): 1 via ORAL
  Filled 2017-11-18 (×3): qty 1

## 2017-11-18 MED ORDER — FENTANYL CITRATE (PF) 100 MCG/2ML IJ SOLN
INTRAMUSCULAR | Status: DC | PRN
  Administered 2017-11-18: 50 ug via INTRAVENOUS
  Administered 2017-11-18: 25 ug via INTRAVENOUS

## 2017-11-18 MED ORDER — ONDANSETRON HCL 4 MG/2ML IJ SOLN: 4.0000 mg | Freq: Four times a day (QID) | INTRAMUSCULAR | Status: DC | PRN

## 2017-11-18 MED ORDER — CEFAZOLIN SODIUM-DEXTROSE 1-4 GM/50ML-% IV SOLN
1.0000 g | Freq: Four times a day (QID) | INTRAVENOUS | Status: AC
  Administered 2017-11-18 – 2017-11-19 (×3): 1 g via INTRAVENOUS
  Filled 2017-11-18 (×3): qty 50

## 2017-11-18 MED ORDER — ACETAMINOPHEN 500 MG PO TABS: 1000.0000 mg | ORAL_TABLET | Freq: Every day | ORAL | Status: DC | PRN

## 2017-11-18 MED ORDER — FENTANYL CITRATE (PF) 100 MCG/2ML IJ SOLN
INTRAMUSCULAR | Status: AC
  Filled 2017-11-18: qty 2

## 2017-11-18 MED ORDER — LIDOCAINE HCL (PF) 1 % IJ SOLN
INTRAMUSCULAR | Status: AC
  Filled 2017-11-18: qty 60

## 2017-11-18 MED ORDER — SODIUM CHLORIDE 0.9 % IV SOLN
80.0000 mg | INTRAVENOUS | Status: AC
  Administered 2017-11-18: 80 mg
  Filled 2017-11-18: qty 2

## 2017-11-18 MED ORDER — MUPIROCIN 2 % EX OINT
1.0000 "application " | TOPICAL_OINTMENT | Freq: Once | CUTANEOUS | Status: AC
  Administered 2017-11-18: 1 via TOPICAL
  Filled 2017-11-18: qty 22

## 2017-11-18 MED ORDER — CEFAZOLIN SODIUM-DEXTROSE 2-3 GM-%(50ML) IV SOLR: INTRAVENOUS | Status: DC | PRN

## 2017-11-18 MED ORDER — SODIUM CHLORIDE 0.9 % IV SOLN
INTRAVENOUS | Status: AC
  Filled 2017-11-18: qty 2

## 2017-11-18 MED ORDER — FERROUS SULFATE 325 (65 FE) MG PO TABS
325.0000 mg | ORAL_TABLET | Freq: Every day | ORAL | Status: DC
  Administered 2017-11-19: 325 mg via ORAL
  Filled 2017-11-18: qty 1

## 2017-11-18 MED ORDER — HEPARIN (PORCINE) IN NACL 1000-0.9 UT/500ML-% IV SOLN
INTRAVENOUS | Status: DC | PRN
  Administered 2017-11-18: 500 mL

## 2017-11-18 MED ORDER — SPIRONOLACTONE 25 MG PO TABS
25.0000 mg | ORAL_TABLET | Freq: Every day | ORAL | Status: DC
  Administered 2017-11-18 – 2017-11-19 (×2): 25 mg via ORAL
  Filled 2017-11-18 (×2): qty 1

## 2017-11-18 MED ORDER — CHLORHEXIDINE GLUCONATE 4 % EX LIQD
60.0000 mL | Freq: Once | CUTANEOUS | Status: DC
  Filled 2017-11-18: qty 750

## 2017-11-18 MED ORDER — CARVEDILOL 3.125 MG PO TABS
3.1250 mg | ORAL_TABLET | ORAL | Status: DC
  Administered 2017-11-18 – 2017-11-19 (×2): 3.125 mg via ORAL
  Filled 2017-11-18 (×2): qty 1

## 2017-11-18 MED ORDER — DIPHENHYDRAMINE HCL 25 MG PO TABS: 50.0000 mg | ORAL_TABLET | ORAL | Status: DC

## 2017-11-18 MED ORDER — FUROSEMIDE 40 MG PO TABS
40.0000 mg | ORAL_TABLET | Freq: Every day | ORAL | Status: DC
  Administered 2017-11-18 – 2017-11-19 (×2): 40 mg via ORAL
  Filled 2017-11-18 (×2): qty 1

## 2017-11-18 MED ORDER — WARFARIN SODIUM 7.5 MG PO TABS
7.5000 mg | ORAL_TABLET | ORAL | Status: DC
  Administered 2017-11-18: 7.5 mg via ORAL
  Filled 2017-11-18: qty 1

## 2017-11-18 SURGICAL SUPPLY — 7 items
CABLE SURGICAL S-101-97-12 (CABLE) ×3 IMPLANT
ICD VISIA MRI VR DVFB1D4 (ICD Generator) ×1 IMPLANT
LEAD SPRINT QUAT SEC 6935M-62 (Lead) ×3 IMPLANT
PAD PRO RADIOLUCENT 2001M-C (PAD) ×3 IMPLANT
SHEATH CLASSIC 9F (SHEATH) ×3 IMPLANT
TRAY PACEMAKER INSERTION (PACKS) ×3 IMPLANT
VISIA MRI VR DVFB1D4 (ICD Generator) ×3 IMPLANT

## 2017-11-18 NOTE — H&P (Signed)
ICD Criteria  Current LVEF:30-35%. Within 12 months prior to implant: Yes   Heart failure history: Yes, Class II  Cardiomyopathy history: Yes, Ischemic Cardiomyopathy - Prior MI.  Atrial Fibrillation/Atrial Flutter: No.  Ventricular tachycardia history: No.  Cardiac arrest history: No.  History of syndromes with risk of sudden death: No.  Previous ICD: No.  Current ICD indication: Primary  PPM indication: No.  Class I or II Bradycardia indication present: No  Beta Blocker therapy for 3 or more months: Yes, prescribed.   Ace Inhibitor/ARB therapy for 3 or more months: Yes, prescribed.    I have seen Joshua May is a 42 y.o. malepre-procedural and has been referred by Marca Ancona for consideration of ICD implant for primary prevention of sudden death.  The patient's chart has been reviewed and they meet criteria for ICD implant.  I have had a thorough discussion with the patient reviewing options.  The patient and their family (if available) have had opportunities to ask questions and have them answered. The patient and I have decided together through the [facility] Heart Care Share Decision Support Tool to implant ICD at this time.  Risks, benefits, alternatives to ICD implantation were discussed in detail with the patient today. The patient  understands that the risks include but are not limited to bleeding, infection, pneumothorax, perforation, tamponade, vascular damage, renal failure, MI, stroke, death, inappropriate shocks, and lead dislodgement and  wishes to proceed.

## 2017-11-19 ENCOUNTER — Ambulatory Visit (HOSPITAL_COMMUNITY): Payer: Medicaid Other

## 2017-11-19 ENCOUNTER — Encounter (HOSPITAL_COMMUNITY): Payer: Self-pay | Admitting: Cardiology

## 2017-11-19 DIAGNOSIS — I5022 Chronic systolic (congestive) heart failure: Secondary | ICD-10-CM | POA: Diagnosis not present

## 2017-11-19 DIAGNOSIS — Z006 Encounter for examination for normal comparison and control in clinical research program: Secondary | ICD-10-CM | POA: Diagnosis not present

## 2017-11-19 DIAGNOSIS — I255 Ischemic cardiomyopathy: Secondary | ICD-10-CM | POA: Diagnosis not present

## 2017-11-19 DIAGNOSIS — J9811 Atelectasis: Secondary | ICD-10-CM | POA: Diagnosis not present

## 2017-11-19 LAB — PROTIME-INR
INR: 2.96
Prothrombin Time: 30.4 seconds — ABNORMAL HIGH (ref 11.4–15.2)

## 2017-11-19 NOTE — Discharge Summary (Addendum)
ELECTROPHYSIOLOGY PROCEDURE DISCHARGE SUMMARY    Patient ID: Joshua May,  MRN: [id number], DOB/AGE: [birth year] 42 y.o.  Admit date: 11/18/2017 Discharge date: 11/19/2017  Primary Care Physician: System, Pcp Not In  Primary Cardiologist: Dr. Shirlee Latch Electrophysiologist: Dr. Elberta Fortis  Primary Discharge Diagnosis:  1. ICM  Secondary Discharge Diagnosis:  1. CAD 2. PE hx     On warfarin, monitored and managed w/the coumadin clinic 3. 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 Shellfish Allergy Anaphylaxis     Procedures This Admission:  1.  Implantation of a MDT single chamber ICD on 11/18/17 by Dr Elberta Fortis. Medtronic Visia AF MRI VR SureScan (serial  Number [id number]) ICD, Medtronic, model [id number] (serial number [id number]) right ventricular defibrillator lead  DFT's were deferred at time of implant There were no immediate post procedure complications. 2.  CXR on 11/19/17 demonstrated no pneumothorax status post device implantation.   Brief HPI: Joshua May is a 42 y.o. male was referred to electrophysiology in the outpatient setting for consideration of ICD implantation.  Past medical history includes above.  The patient has persistent LV dysfunction despite guideline directed therapy.  Risks, benefits, and alternatives to ICD implantation were reviewed with the patient who wished to proceed.   Hospital Course:  The patient was admitted and underwent implantation of an ICD with details as outlined above. He was monitored on telemetry overnight which demonstrated SR.  Left chest was without hematoma or ecchymosis.  The device was interrogated and found to be functioning normally.  CXR was obtained and demonstrated no pneumothorax status post device implantation.  Wound care, arm mobility, and restrictions were reviewed with the patient.  The patient feels well this morning, no CP or SOB, minimal site discomfort only, was examined and considered stable for  discharge to home.   The patient's discharge medications include an ARB (entresto) and beta blocker (carvedilol).   Physical Exam: Vitals:   11/18/17 2340 11/19/17 0545 11/19/17 0622 11/19/17 0758  BP: 108/66 127/76  136/67  Pulse: 88 80 74 74  Resp: 16 19  18   Temp:    (!) 97.3 F (36.3 C)  TempSrc:    Oral  SpO2:  98%  100%  Weight:  118.8 kg    Height:        GEN- The patient is well appearing, alert and oriented x 3 today.   HEENT: normocephalic, atraumatic; sclera clear, conjunctiva pink; hearing intact; oropharynx clear Lungs- CTA b/l, normal work of breathing.  No wheezes, rales, rhonchi Heart- RRR, no murmurs, rubs or gallops, PMI not laterally displaced GI- soft, non-tender, non-distended Extremities- no clubbing, cyanosis, or edema MS- no significant deformity or atrophy Skin- warm and dry, no rash or lesion, left chest without hematoma/ecchymosis Psych- euthymic mood, full affect Neuro- no gross defecits  Labs:   Lab Results  Component Value Date   WBC 8.4 10/27/2017   HGB 13.1 10/27/2017   HCT 39.0 10/27/2017   MCV 87 10/27/2017   PLT 363 10/27/2017   No results for input(s): NA, K, CL, CO2, BUN, CREATININE, CALCIUM, PROT, BILITOT, ALKPHOS, ALT, AST, GLUCOSE in the last 168 hours.  Invalid input(s): LABALBU  Discharge Medications:  Allergies as of 11/19/2017      Reactions   Shellfish Allergy Anaphylaxis      Medication List    STOP taking these medications   diphenhydrAMINE 50 MG tablet Commonly known as:  BENADRYL   predniSONE 50 MG tablet Commonly  known as:  DELTASONE     TAKE these medications   acetaminophen 500 MG tablet Commonly known as:  TYLENOL Take 1,000 mg by mouth daily as needed for moderate pain or headache.   atorvastatin 80 MG tablet Commonly known as:  LIPITOR Take 1 tablet (80 mg total) by mouth daily.   carvedilol 3.125 MG tablet Commonly known as:  COREG Take 3.125-6.25 mg by mouth See admin instructions. Take 3.125  mg in the morning and 6.25 mg in the evening What changed:  Another medication with the same name was removed. Continue taking this medication, and follow the directions you see here.   digoxin 0.125 MG tablet Commonly known as:  LANOXIN Take 1 tablet (0.125 mg total) by mouth daily.   ferrous sulfate 325 (65 FE) MG tablet Take 1 tablet (325 mg total) by mouth daily with breakfast. For one month then stop.   folic acid 400 MCG tablet Commonly known as:  FOLVITE Take 400 mcg by mouth daily.   folic acid 1 MG tablet Commonly known as:  FOLVITE Take 1 tablet (1 mg total) by mouth daily. For one month then stop.   furosemide 40 MG tablet Commonly known as:  LASIX Take 1 tablet (40 mg total) by mouth daily.   Meclizine HCl 25 MG Chew Chew 1 tablet (25 mg total) by mouth daily as needed. What changed:  reasons to take this   potassium chloride SA 20 MEQ tablet Commonly known as:  K-DUR,KLOR-CON Take 1 tablet (20 mEq total) by mouth daily.   psyllium 58.6 % powder Commonly known as:  METAMUCIL Take 1 packet by mouth daily as needed (constipation).   sacubitril-valsartan 24-26 MG Commonly known as:  ENTRESTO Take 1 tablet by mouth 2 (two) times daily.   spironolactone 25 MG tablet Commonly known as:  ALDACTONE Take 1 tablet (25 mg total) by mouth daily. Take in the AM   warfarin 7.5 MG tablet Commonly known as:  COUMADIN Take as directed. If you are unsure how to take this medication, talk to your nurse or doctor. Original instructions:  Take 1/2 to 1 tablet daily as directed by Coumadin clinic What changed:    how much to take  how to take this  when to take this  additional instructions       Disposition: Home  Discharge Instructions    Diet - low sodium heart healthy   Complete by:  As directed    Increase activity slowly   Complete by:  As directed      Follow-up Information    [facility] Office Follow up.   Specialty:  Cardiology Why:   114/19 @ 3:00PM, coumadin clinic 12/01/17 @ 10:00AM, wound check visit Contact information: [address] [place] [address] [phone]       Regan Lemming, MD Follow up on 02/23/2018.   Specialty:  Cardiology Why:  10:30AM Contact information: [address] [place] [address] [phone]           Duration of Discharge Encounter: Greater than 30 minutes including physician time.  SignedFrancis Dowse, PA-C 11/19/2017 10:23 AM     I have seen and examined this patient with Francis Dowse.  Agree with above, note added to reflect my findings.  On exam, RRR, no murmurs, lungs clear.  Patient mid to the hospital for ICD implant.  Device interrogation and chest x-ray without major abnormality.  Plan for discharge today  with follow-up in device clinic.  Skylur Fuston M. Tempie Gibeault MD 11/19/2017 10:52 AM

## 2017-11-19 NOTE — Discharge Instructions (Signed)
° ° °  Supplemental Discharge Instructions for  Pacemaker/Defibrillator Patients  Activity No heavy lifting or vigorous activity with your left/right arm for 6 to 8 weeks.  Do not raise your left/right arm above your head for one week.  Gradually raise your affected arm as drawn below.             11/22/17                     11/23/17                    11/24/17                  11/25/17 __  NO DRIVING for  1 week   ; you may begin driving on   13/0/86  .  WOUND CARE - Keep the wound area clean and dry.  Do not get this area wet, no showers until after your wound check visit . - The tape/steri-strips on your wound will fall off; do not pull them off.  No bandage is needed on the site.  DO  NOT apply any creams, oils, or ointments to the wound area. - If you notice any drainage or discharge from the wound, any swelling or bruising at the site, or you develop a fever > 101? F after you are discharged home, call the office at once.  Special Instructions - You are still able to use cellular telephones; use the ear opposite the side where you have your pacemaker/defibrillator.  Avoid carrying your cellular phone near your device. - When traveling through airports, show security personnel your identification card to avoid being screened in the metal detectors.  Ask the security personnel to use the hand wand. - Avoid arc welding equipment, MRI testing (magnetic resonance imaging), TENS units (transcutaneous nerve stimulators).  Call the office for questions about other devices. - Avoid electrical appliances that are in poor condition or are not properly grounded. - Microwave ovens are safe to be near or to operate.  Additional information for defibrillator patients should your device go off: - If your device goes off ONCE and you feel fine afterward, notify the device clinic nurses. - If your device goes off ONCE and you do not feel well afterward, call 911. - If your device goes off TWICE, call  911. - If your device goes off THREE times in one day, call 911.  DO NOT DRIVE YOURSELF OR A FAMILY MEMBER WITH A DEFIBRILLATOR TO THE HOSPITAL--CALL 911.

## 2017-11-22 ENCOUNTER — Ambulatory Visit (INDEPENDENT_AMBULATORY_CARE_PROVIDER_SITE_OTHER): Payer: Self-pay | Admitting: *Deleted

## 2017-11-22 ENCOUNTER — Telehealth (HOSPITAL_COMMUNITY): Payer: Self-pay | Admitting: Pharmacist

## 2017-11-22 DIAGNOSIS — D6862 Lupus anticoagulant syndrome: Secondary | ICD-10-CM

## 2017-11-22 DIAGNOSIS — Z9889 Other specified postprocedural states: Secondary | ICD-10-CM

## 2017-11-22 DIAGNOSIS — I340 Nonrheumatic mitral (valve) insufficiency: Secondary | ICD-10-CM

## 2017-11-22 DIAGNOSIS — Z5181 Encounter for therapeutic drug level monitoring: Secondary | ICD-10-CM

## 2017-11-22 LAB — POCT INR: INR: 2.4 (ref 2.0–3.0)

## 2017-11-22 NOTE — Telephone Encounter (Signed)
[employer] patient assistance approved for [employer] 24-26 mg BID through 10/19/18.   [name]. Bonnye Fava, PharmD, BCPS, CPP Clinical Pharmacist Phone: [phone] 11/22/2017 11:16 AM

## 2017-11-22 NOTE — Patient Instructions (Signed)
Description   Continue taking 1/2 tablet daily except 1 tablet on Tuesdays, Thursdays, and Saturdays.  Recheck INR in 4 weeks. Call with any new medications or procedures Coumadin Clinic [phone] Main [phone]

## 2017-12-01 ENCOUNTER — Ambulatory Visit (INDEPENDENT_AMBULATORY_CARE_PROVIDER_SITE_OTHER): Payer: Self-pay | Admitting: *Deleted

## 2017-12-01 DIAGNOSIS — I255 Ischemic cardiomyopathy: Secondary | ICD-10-CM

## 2017-12-01 LAB — CUP PACEART INCLINIC DEVICE CHECK
Date Time Interrogation Session: 20191113105348
HighPow Impedance: 71 Ohm
Implantable Lead Implant Date: 20191031
Implantable Lead Location: 753860
Implantable Lead Model: 6935
Lead Channel Pacing Threshold Amplitude: 0.75 V
Lead Channel Pacing Threshold Pulse Width: 0.4 ms
Lead Channel Sensing Intrinsic Amplitude: 15.375 mV
Lead Channel Setting Pacing Amplitude: 3.5 V
MDC IDC MSMT BATTERY REMAINING LONGEVITY: 137 mo
MDC IDC MSMT BATTERY VOLTAGE: 3.14 V
MDC IDC MSMT LEADCHNL RV IMPEDANCE VALUE: 342 Ohm
MDC IDC MSMT LEADCHNL RV IMPEDANCE VALUE: 399 Ohm
MDC IDC PG IMPLANT DT: 20191031
MDC IDC SET LEADCHNL RV PACING PULSEWIDTH: 0.4 ms
MDC IDC SET LEADCHNL RV SENSING SENSITIVITY: 0.3 mV
MDC IDC STAT BRADY RV PERCENT PACED: 0.01 %

## 2017-12-01 NOTE — Progress Notes (Signed)
Wound check appointment. Steri-strips removed. Wound without redness or edema. Incision edges approximated, wound well healed. Normal device function. Threshold, sensing, and impedances consistent with implant measurements. Device programmed at 3.5V for extra safety margin until 3 month visit. Histogram distribution appropriate for patient and level of activity. No ventricular arrhythmias noted. Patient educated about wound care, arm mobility, lifting restrictions, shock plan. ROV with WC 02/23/2018

## 2017-12-03 MED FILL — POTASSIUM CL ER 20 MEQ TAB: 20 | 34 days supply | Qty: 34 | Fill #1

## 2017-12-03 MED FILL — CARVEDILOL 3.125 MG TABLET: 3.125 | 30 days supply | Qty: 60 | Fill #2

## 2017-12-09 ENCOUNTER — Telehealth (HOSPITAL_COMMUNITY): Payer: Self-pay

## 2017-12-09 NOTE — Telephone Encounter (Signed)
Mr. Joshua PlunkBest confirmed he received and is taking Entresto and has stopped the losartan ("green pill"). His wife (who takes care of his meds) was not present but she will call me back if he is running low on any of his meds.  Pt stated there are no big changes in how he's been feeling. He denies edema/changes in weight. He reports SOB sometimes but not all the time. He stated compliance with limiting fluids/salt.   Mirna MiresAngela Nomi Rudnicki  PharmD Candidate  [employer]  Class of [phone]

## 2017-12-22 ENCOUNTER — Ambulatory Visit (INDEPENDENT_AMBULATORY_CARE_PROVIDER_SITE_OTHER): Payer: Self-pay | Admitting: Pharmacist

## 2017-12-22 ENCOUNTER — Other Ambulatory Visit: Payer: Self-pay | Admitting: Cardiology

## 2017-12-22 DIAGNOSIS — Z5181 Encounter for therapeutic drug level monitoring: Secondary | ICD-10-CM

## 2017-12-22 DIAGNOSIS — I340 Nonrheumatic mitral (valve) insufficiency: Secondary | ICD-10-CM

## 2017-12-22 DIAGNOSIS — D6862 Lupus anticoagulant syndrome: Secondary | ICD-10-CM

## 2017-12-22 DIAGNOSIS — Z9889 Other specified postprocedural states: Secondary | ICD-10-CM

## 2017-12-22 LAB — POCT INR: INR: 2 (ref 2.0–3.0)

## 2017-12-22 MED FILL — CARVEDILOL 3.125 MG TABLET: 3.125 | 30 days supply | Qty: 60 | Fill #1

## 2017-12-22 MED FILL — FUROSEMIDE 40 MG TAB: 40 | 34 days supply | Qty: 34 | Fill #2

## 2017-12-22 MED FILL — SPIRONOLACTONE 25 MG TABLET: 25 | 30 days supply | Qty: 30 | Fill #3

## 2017-12-22 MED FILL — ATORVASTATIN 80 MG TABLET: 80 | 30 days supply | Qty: 30 | Fill #2

## 2017-12-22 MED FILL — DIGOXIN 0.125 MG TABLET: 125 | 31 days supply | Qty: 31 | Fill #2

## 2017-12-22 NOTE — Patient Instructions (Signed)
Description   Continue taking 1 tablet daily except 1/2 tablet on Sundays, Wednesdays, and Fridays.  Recheck INR in 6 weeks. Call with any new medications or procedures Coumadin Clinic [phone] Main [phone]

## 2018-01-06 ENCOUNTER — Telehealth (HOSPITAL_COMMUNITY): Payer: Self-pay

## 2018-01-06 NOTE — Telephone Encounter (Signed)
Mr. Joshua PlunkBest states he is still taking the potassium and will check with his wife later to see if he is getting low and needs a refill.  He weighs himself every day and denies any sudden large change in wt. Denies edema but admits to some SOB (this is normal for him).   Joshua MiresAngela Brittnye May  PharmD Candidate  [employer]  Class of [phone]

## 2018-01-11 MED FILL — POTASSIUM CHLORIDE CRYS ER: 20 | 34 days supply | Qty: 34 | Fill #2

## 2018-01-20 MED FILL — CARVEDILOL 3.125 MG TABLET: 3.125 | 30 days supply | Qty: 60 | Fill #2

## 2018-01-20 MED FILL — SPIRONOLACTONE 25 MG TABLET: 25 | 34 days supply | Qty: 34 | Fill #1

## 2018-01-20 MED FILL — FUROSEMIDE 40 MG TAB: 40 | 34 days supply | Qty: 34 | Fill #3

## 2018-01-20 MED FILL — ATORVASTATIN 80 MG TABLET: 80 | 34 days supply | Qty: 34 | Fill #1

## 2018-01-24 ENCOUNTER — Telehealth: Payer: Self-pay | Admitting: *Deleted

## 2018-01-24 NOTE — Telephone Encounter (Signed)
Left message for MR. Levengood to call [phone] regarding BEAT HF research study

## 2018-02-08 ENCOUNTER — Encounter: Payer: Self-pay | Admitting: Cardiology

## 2018-02-11 ENCOUNTER — Ambulatory Visit (INDEPENDENT_AMBULATORY_CARE_PROVIDER_SITE_OTHER): Payer: Self-pay

## 2018-02-11 ENCOUNTER — Other Ambulatory Visit (HOSPITAL_COMMUNITY): Payer: Self-pay | Admitting: Adult Health

## 2018-02-11 ENCOUNTER — Other Ambulatory Visit (HOSPITAL_COMMUNITY): Payer: Self-pay | Admitting: Cardiology

## 2018-02-11 DIAGNOSIS — D6862 Lupus anticoagulant syndrome: Secondary | ICD-10-CM

## 2018-02-11 DIAGNOSIS — I340 Nonrheumatic mitral (valve) insufficiency: Secondary | ICD-10-CM

## 2018-02-11 DIAGNOSIS — I5022 Chronic systolic (congestive) heart failure: Secondary | ICD-10-CM

## 2018-02-11 DIAGNOSIS — Z9889 Other specified postprocedural states: Secondary | ICD-10-CM

## 2018-02-11 DIAGNOSIS — Z5181 Encounter for therapeutic drug level monitoring: Secondary | ICD-10-CM

## 2018-02-11 LAB — POCT INR: INR: 2.1 (ref 2.0–3.0)

## 2018-02-11 MED ORDER — CARVEDILOL 3.125 MG PO TABS: ORAL_TABLET | ORAL | 2 refills | Status: DC

## 2018-02-11 MED ORDER — FUROSEMIDE 40 MG PO TABS: 40.0000 mg | ORAL_TABLET | Freq: Every day | ORAL | 3 refills | Status: DC

## 2018-02-11 MED FILL — DIGOXIN 0.125 MG TABLET: 125 | 31 days supply | Qty: 31 | Fill #3

## 2018-02-11 MED FILL — CARVEDILOL 3.125 MG TABLET: 3.125 | 34 days supply | Qty: 68 | Fill #1

## 2018-02-11 MED FILL — SPIRONOLACTONE 25 MG TABLET: 25 | 34 days supply | Qty: 34 | Fill #2

## 2018-02-11 MED FILL — POTASSIUM CHLORIDE CRYS ER: 20 | 34 days supply | Qty: 34 | Fill #3

## 2018-02-11 NOTE — Patient Instructions (Signed)
Description   Continue taking 1 tablet daily except 1/2 tablet on Mondays, Wednesdays, and Fridays.  Recheck INR in 6 weeks. Call with any new medications or procedures Coumadin Clinic [phone] Main [phone]     

## 2018-02-17 ENCOUNTER — Other Ambulatory Visit (HOSPITAL_COMMUNITY): Payer: Self-pay | Admitting: *Deleted

## 2018-02-17 DIAGNOSIS — I5022 Chronic systolic (congestive) heart failure: Secondary | ICD-10-CM

## 2018-02-17 MED ORDER — FUROSEMIDE 40 MG PO TABS: 40.0000 mg | ORAL_TABLET | Freq: Every day | ORAL | 0 refills | Status: DC

## 2018-02-17 MED FILL — ATORVASTATIN 80 MG TABLET: 80 | 34 days supply | Qty: 34 | Fill #2

## 2018-02-17 MED FILL — FUROSEMIDE 40 MG TAB: 40 | 34 days supply | Qty: 34 | Fill #0

## 2018-02-23 ENCOUNTER — Encounter: Payer: Self-pay | Admitting: Cardiology

## 2018-02-23 NOTE — Progress Notes (Deleted)
Electrophysiology Office Note   Date:  02/23/2018   ID:  DAIRON SPOSATO, DOB [birth year], MRN [id number]  PCP:  System, Pcp Not In  Cardiologist:  Shirlee Latch Primary Electrophysiologist:  Will [name], MD    No chief complaint on file.    History of Present Illness: Melroy Schnackenberg Sedlak is a 43 y.o. male who is being seen today for the evaluation of CHF at the request of Marca Ancona. Presenting today for electrophysiology evaluation.  History of coronary disease dating back to 2007 when he had PCI [place].  In 2010 he presented with STEMI and had a RCA drug-eluting stent placed.  He was lost to follow-up until October 2017 when he presented with a circumflex infarct.  Cath revealed 50% in-stent restenosis of the RCA and a total circumflex and 85% OM 3 occlusion.  He had PCI at that time.  His ejection fraction was 50 to 55%.  February 2019 he was admitted with dyspnea and chest pain.  CTA confirmed a PE.  Echo showed an ejection fraction of 20 to 25%.  CT surgery was consulted for severe mitral regurgitation.  The plan was to optimize his heart failure medications and set up for TEE as an outpatient.  On 06/2017 he was scheduled for mitral valve repair.  He is status post Medtronic ICD implanted 11/18/2017.  Today, denies symptoms of palpitations, chest pain, shortness of breath, orthopnea, PND, lower extremity edema, claudication, dizziness, presyncope, syncope, bleeding, or neurologic sequela. The patient is tolerating medications without difficulties. ***    Past Medical History:  Diagnosis Date  . Asthma   . CAD (coronary artery disease)   . Chronic systolic CHF (congestive heart failure) (HCC)   . HLD (hyperlipidemia)    Qualifier: Diagnosis of  By: Antoine Poche, MD, Gerrit Heck    . Hypercholesteremia   . Inferior myocardial infarction [facility]) 2007   PCI of RCA  . Inferior myocardial infarction (HCC) 03/31/2008   PCI of RCA with bare metal stent  . Ischemic cardiomyopathy   .  Migraines    and dizziness  . Mitral regurgitation   . Obstructive sleep apnea    Qualifier: Diagnosis of  By: Antoine Poche, MD, Gerrit Heck    . 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 Pulmonary embolism (HCC) 02/28/2017  . 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 Tobacco abuse    Past Surgical History:  Procedure Laterality Date  . CARDIAC CATHETERIZATION N/A 10/20/2015   Procedure: Left Heart Cath and Coronary Angiography;  Surgeon: Kathleene Hazel, MD;  Location: [facility] INVASIVE CV LAB;  Service: Cardiovascular;  Laterality: N/A;  . CARDIAC CATHETERIZATION N/A 10/20/2015   Procedure: Coronary Stent Intervention;  Surgeon: Kathleene Hazel, MD;  Location: [facility] INVASIVE CV LAB;  Service: Cardiovascular;  Laterality: N/A;  . ICD IMPLANT N/A 11/18/2017   Procedure: ICD IMPLANT;  Surgeon: Regan Lemming, MD;  Location: [facility] INVASIVE CV LAB;  Service: Cardiovascular;  Laterality: N/A;  . INTRAVASCULAR PRESSURE WIRE/FFR STUDY N/A 03/03/2017   Procedure: INTRAVASCULAR PRESSURE WIRE/FFR STUDY;  Surgeon: Marykay Lex, MD;  Location: [facility] INVASIVE CV LAB;  Service: Cardiovascular;  Laterality: N/A;  . LEFT HEART CATH AND CORONARY ANGIOGRAPHY N/A 03/03/2017   Procedure: LEFT HEART CATH AND CORONARY ANGIOGRAPHY;  Surgeon: Marykay Lex, MD;  Location: [facility] INVASIVE CV LAB;  Service: Cardiovascular;  Laterality: N/A;  .  MITRAL VALVE REPAIR Right 07/07/2017   Procedure: MINIMALLY INVASIVE MITRAL VALVE REPAIR (MVR) using Carpentier-McCarthy Adams Ring size 26;  Surgeon: Purcell Nails, MD;  Location: [facility] OR;  Service: Open Heart Surgery;  Laterality: Right;  . MULTIPLE EXTRACTIONS WITH ALVEOLOPLASTY N/A 05/13/2017   Procedure: Extraction of tooth #'s 2,15,16,17 with alveoloplasty and gross debridement of remaining teeth.;  Surgeon: Charlynne Pander, DDS;  Location: [facility] OR;  Service: Oral Surgery;   Laterality: N/A;  . none    . OTHER SURGICAL HISTORY     NONE  . PATENT FORAMEN OVALE(PFO) CLOSURE N/A 07/07/2017   Procedure: PATENT FORAMEN OVALE (PFO) CLOSURE;  Surgeon: Purcell Nails, MD;  Location: [facility] OR;  Service: Open Heart Surgery;  Laterality: N/A;  . RIGHT HEART CATH N/A 09/22/2017   Procedure: RIGHT HEART CATH;  Surgeon: Laurey Morale, MD;  Location: [facility] INVASIVE CV LAB;  Service: Cardiovascular;  Laterality: N/A;  . TEE WITHOUT CARDIOVERSION N/A 04/19/2017   Procedure: TRANSESOPHAGEAL ECHOCARDIOGRAM (TEE);  Surgeon: Laurey Morale, MD;  Location: [facility] ENDOSCOPY;  Service: Cardiovascular;  Laterality: N/A;  . TEE WITHOUT CARDIOVERSION N/A 07/07/2017   Procedure: TRANSESOPHAGEAL ECHOCARDIOGRAM (TEE);  Surgeon: Purcell Nails, MD;  Location: [facility] OR;  Service: Open Heart Surgery;  Laterality: N/A;     Current Outpatient Medications  Medication Sig Dispense Refill  . acetaminophen (TYLENOL) 500 MG tablet Take 1,000 mg by mouth daily as needed for moderate pain or headache.    Marland Kitchen atorvastatin (LIPITOR) 80 MG tablet Take 1 tablet (80 mg total) by mouth daily. 30 tablet 2  . carvedilol (COREG) 3.125 MG tablet TAKE 1 TABLET BY MOUTH 2 TIMES DAILY WITH A MEAL. 60 tablet 2  . digoxin (LANOXIN) 0.125 MG tablet Take 1 tablet (0.125 mg total) by mouth daily. 31 tablet 3  . ferrous sulfate 325 (65 FE) MG tablet Take 1 tablet (325 mg total) by mouth daily with breakfast. For one month then stop. 30 tablet 3  . folic acid (FOLVITE) 1 MG tablet Take 1 tablet (1 mg total) by mouth daily. For one month then stop. (Patient not taking: Reported on 12/01/2017)    . folic acid (FOLVITE) 400 MCG tablet Take 400 mcg by mouth daily.    . furosemide (LASIX) 40 MG tablet Take 1 tablet (40 mg total) by mouth daily. 34 tablet 0  . Meclizine HCl 25 MG CHEW Chew 1 tablet (25 mg total) by mouth daily as needed. (Patient not taking: Reported on 12/01/2017) 30 tablet 0  . potassium chloride SA (K-DUR,KLOR-CON) 20  MEQ tablet Take 1 tablet (20 mEq total) by mouth daily. 34 tablet 3  . psyllium (METAMUCIL) 58.6 % powder Take 1 packet by mouth daily as needed (constipation).    . sacubitril-valsartan (ENTRESTO) 24-26 MG Take 1 tablet by mouth 2 (two) times daily. 60 tablet 11  . spironolactone (ALDACTONE) 25 MG tablet Take 1 tablet (25 mg total) by mouth daily. Take in the AM 30 tablet 3  . warfarin (COUMADIN) 7.5 MG tablet TAKE 1/2 TO 1 (ONE-HALF TO ONE) TABLET BY MOUTH  DAILY AS DIRECTED BY COUMADIN CLINIC 90 tablet 0   No current facility-administered medications for this visit.     Allergies:   Shellfish allergy   Social History:  The patient  reports that he has quit smoking. His smoking use included cigarettes. He has a 8.50 pack-year smoking history. He has never used smokeless tobacco. He reports previous alcohol use. He reports previous drug  use. Drug: Marijuana.   Family History:  The patient's family history includes Deep vein thrombosis in his father; Heart attack in his father; Other (age of onset: 75) in his sister.    ROS:  Please see the history of present illness.   Otherwise, review of systems is positive for ***.   All other systems are reviewed and negative.   PHYSICAL EXAM: VS:  There were no vitals taken for this visit. , BMI There is no height or weight on file to calculate BMI. GEN: Well nourished, well developed, in no acute distress  HEENT: normal  Neck: no JVD, carotid bruits, or masses Cardiac: ***RRR; no murmurs, rubs, or gallops,no edema  Respiratory:  clear to auscultation bilaterally, normal work of breathing GI: soft, nontender, nondistended, + BS MS: no deformity or atrophy  Skin: warm and dry Neuro:  Strength and sensation are intact Psych: euthymic mood, full affect  EKG:  EKG {ACTION; IS/IS [id number]} ordered today. Personal review of the ekg ordered *** shows ***    Recent Labs: 07/05/2017: ALT 32 07/08/2017: Magnesium 2.2 07/11/2017: B Natriuretic  Peptide 567.1 10/27/2017: BUN 12; Creatinine, Ser 0.87; Hemoglobin 13.1; Platelets 363; Potassium 3.9; Sodium 143    Lipid Panel     Component Value Date/Time   CHOL 110 08/18/2017 1223   TRIG 140 08/18/2017 1223   HDL 31 (L) 08/18/2017 1223   CHOLHDL 3.5 08/18/2017 1223   VLDL 28 08/18/2017 1223   LDLCALC 51 08/18/2017 1223   LDLDIRECT 149.8 11/15/2009 0933     Wt Readings from Last 3 Encounters:  11/19/17 262 lb (118.8 kg)  10/27/17 265 lb 6.4 oz (120.4 kg)  10/27/17 263 lb (119.3 kg)      Other studies Reviewed: Additional studies/ records that were reviewed today include: TTE 10/04/17  Review of the above records today demonstrates:  - Left ventricle: The cavity size was mildly dilated. Systolic   function was moderately to severely reduced. The estimated   ejection fraction was in the range of 30% to 35%. Diffuse   hypokinesis. - Aortic valve: There was no stenosis. - Mitral valve: Status post mitral valve repair. No significant   mitral stenosis. There was trivial regurgitation. Pressure   half-time: 104 ms. Mean gradient (D): 6 mm Hg. Valve area by   pressure half-time: 2.12 cm^2. - Right ventricle: The cavity size was normal. Systolic function   was normal. - Tricuspid valve: Peak RV-RA gradient (S): 28 mm Hg. - Pulmonary arteries: PA peak pressure: 31 mm Hg (S). - Inferior vena cava: The vessel was normal in size. The   respirophasic diameter changes were in the normal range (>= 50%),   consistent with normal central venous pressure.   ASSESSMENT AND PLAN:  1.  Chronic systolic heart failure due to ischemic cardiomyopathy: Early on optimal medical therapy.  Is status post Medtronic ICD implanted 11/18/2017.***  2.  Coronary artery disease: ***Stents in the circumflex and RCA.  Left heart cath in 2019 with nonobstructive disease.  3.  Pulmonary embolism: ***Found in 2019 with no definitive trigger.  His father has a history of VTE.  Currently on  Coumadin.  4.  Mitral regurgitation status post minimally invasive mitral valve repair 06/2017: ***Postop echo with no significant gradient.  Plan per primary cardiology    Current medicines are reviewed at length with the patient today.   The patient does not have concerns regarding his medicines.  The following changes were made today:  ***  Labs/ tests  ordered today include:  No orders of the defined types were placed in this encounter.    Disposition:   FU with Will Camnitz *** months  Signed, Will Jorja LoaMartin Camnitz, MD  02/23/2018 1:55 PM     [facility] [address] [place] [address] [phone] (office) [phone] (fax)

## 2018-02-24 ENCOUNTER — Encounter: Payer: Self-pay | Admitting: Cardiology

## 2018-03-01 ENCOUNTER — Other Ambulatory Visit (HOSPITAL_COMMUNITY): Payer: Self-pay

## 2018-03-01 DIAGNOSIS — I5022 Chronic systolic (congestive) heart failure: Secondary | ICD-10-CM

## 2018-03-01 MED ORDER — CARVEDILOL 3.125 MG PO TABS: ORAL_TABLET | ORAL | 2 refills | Status: DC

## 2018-03-01 MED ORDER — SPIRONOLACTONE 25 MG PO TABS: 25.0000 mg | ORAL_TABLET | Freq: Every day | ORAL | 3 refills | Status: DC

## 2018-03-01 MED FILL — SPIRONOLACTONE 25 MG TABLET: 25 | 30 days supply | Qty: 30 | Fill #0 | Status: TO

## 2018-03-01 MED FILL — FUROSEMIDE 40 MG TAB: 40 | 34 days supply | Qty: 34 | Fill #0

## 2018-03-01 MED FILL — CARVEDILOL 3.125 MG TABLET: 3.125 | 34 days supply | Qty: 68 | Fill #2

## 2018-03-02 MED FILL — ATORVASTATIN 80 MG TABLET: 80 | 34 days supply | Qty: 34 | Fill #2

## 2018-03-04 ENCOUNTER — Other Ambulatory Visit (HOSPITAL_COMMUNITY): Payer: Self-pay

## 2018-03-04 DIAGNOSIS — I5022 Chronic systolic (congestive) heart failure: Secondary | ICD-10-CM

## 2018-03-04 MED ORDER — CARVEDILOL 3.125 MG PO TABS: ORAL_TABLET | ORAL | 2 refills | Status: DC

## 2018-03-15 ENCOUNTER — Other Ambulatory Visit (HOSPITAL_COMMUNITY): Payer: Self-pay | Admitting: Student

## 2018-03-15 ENCOUNTER — Other Ambulatory Visit (HOSPITAL_COMMUNITY): Payer: Self-pay | Admitting: Cardiology

## 2018-03-16 MED FILL — POTASSIUM CHLORIDE CRYS ER: 20 | 30 days supply | Qty: 30 | Fill #0 | Status: TO

## 2018-03-16 MED FILL — DIGOXIN 0.125 MG TABLET: 125 | 30 days supply | Qty: 30 | Fill #0 | Status: TO

## 2018-03-25 ENCOUNTER — Ambulatory Visit (INDEPENDENT_AMBULATORY_CARE_PROVIDER_SITE_OTHER): Payer: Self-pay

## 2018-03-25 DIAGNOSIS — Z5181 Encounter for therapeutic drug level monitoring: Secondary | ICD-10-CM

## 2018-03-25 DIAGNOSIS — I340 Nonrheumatic mitral (valve) insufficiency: Secondary | ICD-10-CM

## 2018-03-25 DIAGNOSIS — D6862 Lupus anticoagulant syndrome: Secondary | ICD-10-CM

## 2018-03-25 DIAGNOSIS — Z9889 Other specified postprocedural states: Secondary | ICD-10-CM

## 2018-03-25 LAB — POCT INR: INR: 1.3 — AB (ref 2.0–3.0)

## 2018-03-25 NOTE — Patient Instructions (Signed)
Description   Take 1.5 tablets today, then resume same dosage 1 tablet daily except 1/2 tablet on Mondays, Wednesdays, and Fridays.  Recheck INR in 2 weeks. Call with any new medications or procedures [facility] [phone] Main [phone]

## 2018-03-30 ENCOUNTER — Other Ambulatory Visit (HOSPITAL_COMMUNITY): Payer: Self-pay | Admitting: Adult Health

## 2018-03-30 MED FILL — CARVEDILOL 3.125 MG TABLET: 3.125 | 30 days supply | Qty: 60 | Fill #0 | Status: TO

## 2018-04-05 ENCOUNTER — Other Ambulatory Visit (HOSPITAL_COMMUNITY): Payer: Self-pay | Admitting: Adult Health

## 2018-04-05 ENCOUNTER — Other Ambulatory Visit (HOSPITAL_COMMUNITY): Payer: Self-pay | Admitting: Cardiology

## 2018-04-05 DIAGNOSIS — I5022 Chronic systolic (congestive) heart failure: Secondary | ICD-10-CM

## 2018-04-06 ENCOUNTER — Telehealth (HOSPITAL_COMMUNITY): Payer: Self-pay

## 2018-04-06 MED FILL — FUROSEMIDE 40 MG TAB: 40 | 34 days supply | Qty: 34 | Fill #0

## 2018-04-06 NOTE — Telephone Encounter (Signed)
Spoke to pt. Pt endorses increase shortness of breath and weight gain. Pt desires to keep up coming appointment 04/11/18.

## 2018-04-07 ENCOUNTER — Telehealth: Payer: Self-pay | Admitting: *Deleted

## 2018-04-07 ENCOUNTER — Encounter (HOSPITAL_COMMUNITY): Payer: Self-pay

## 2018-04-07 MED FILL — ATORVASTATIN 80 MG TABLET: 80 | 34 days supply | Qty: 34 | Fill #0

## 2018-04-07 NOTE — Telephone Encounter (Signed)
1. Have you recently travelled abroad or to [place], [place], or [place]?NO 2. Do you currently have a fever? NO 3. Have you been in contact with someone that is currently pending confirmation of COVID19 testing or has been confirmed to have the COVID19 virus? NO 4. Are you currently experiencing fatigue or cough? NO 5. Are you currently experiencing new or worsening shortness of breath at rest or with minimal activity? NO 6. Have you been in contact with someone that was recently sick with fever/cough/fatigue? NO   **A score of 4 or more should result in cancellation of the pts cardiology appt  **A score of 2 should be provided a mask prior to admission into the lobby  **TRAVEL to a high risk area or contact with a confirmed case should stay at home, away from confirmed patient, monitor symptoms, and reach out to PCP for evisit, additional testing.   **ALL PTS WITH FEVER SHOULD BE REFERRED TO PCP FOR EVISIT  Pt advised that we are restricting visitors at this time and request that only patients present for check-in prior to their appointment. All other visitors should remain in their car. If necessary, only one visitor may come with the patient into the building. For everyone's safety, all patients and visitors entering our practice area should expect to be screened again prior to entering our waiting area.

## 2018-04-08 ENCOUNTER — Other Ambulatory Visit: Payer: Self-pay

## 2018-04-08 ENCOUNTER — Encounter: Payer: Self-pay | Admitting: *Deleted

## 2018-04-08 ENCOUNTER — Ambulatory Visit (INDEPENDENT_AMBULATORY_CARE_PROVIDER_SITE_OTHER): Payer: Self-pay | Admitting: *Deleted

## 2018-04-08 DIAGNOSIS — Z9889 Other specified postprocedural states: Secondary | ICD-10-CM

## 2018-04-08 DIAGNOSIS — Z5181 Encounter for therapeutic drug level monitoring: Secondary | ICD-10-CM

## 2018-04-08 DIAGNOSIS — D6862 Lupus anticoagulant syndrome: Secondary | ICD-10-CM

## 2018-04-08 DIAGNOSIS — I340 Nonrheumatic mitral (valve) insufficiency: Secondary | ICD-10-CM

## 2018-04-08 LAB — POCT INR: INR: 2.3 (ref 2.0–3.0)

## 2018-04-08 NOTE — Patient Instructions (Signed)
Description   Continue taking 1 tablet daily except 1/2 tablet on Mondays, Wednesdays, and Fridays.  Recheck INR in 6 weeks. Call with any new medications or procedures Coumadin Clinic [phone] Main [phone]     

## 2018-04-11 ENCOUNTER — Encounter (HOSPITAL_COMMUNITY): Payer: Self-pay

## 2018-04-12 ENCOUNTER — Ambulatory Visit (HOSPITAL_COMMUNITY)
Admission: RE | Admit: 2018-04-12 | Discharge: 2018-04-12 | Disposition: A | Discharge status: Home / Self Care | Payer: Medicaid Other | Source: Ambulatory Visit | Attending: Cardiology | Admitting: Cardiology

## 2018-04-12 ENCOUNTER — Other Ambulatory Visit: Payer: Self-pay

## 2018-04-12 ENCOUNTER — Other Ambulatory Visit (HOSPITAL_COMMUNITY): Payer: Self-pay

## 2018-04-12 ENCOUNTER — Encounter (HOSPITAL_COMMUNITY): Payer: Self-pay

## 2018-04-12 VITALS — Wt 275.4 lb

## 2018-04-12 DIAGNOSIS — I2510 Atherosclerotic heart disease of native coronary artery without angina pectoris: Secondary | ICD-10-CM

## 2018-04-12 DIAGNOSIS — I5022 Chronic systolic (congestive) heart failure: Secondary | ICD-10-CM

## 2018-04-12 DIAGNOSIS — I255 Ischemic cardiomyopathy: Secondary | ICD-10-CM

## 2018-04-12 MED ORDER — ATORVASTATIN CALCIUM 80 MG PO TABS: 80.0000 mg | ORAL_TABLET | Freq: Every day | ORAL | 2 refills | Status: DC

## 2018-04-12 NOTE — Patient Instructions (Addendum)
TAKE an extra Lasix 40mg  for 2 days  Take an extra Potassium for 2 days.  AFTER 2 days, go back to your normal dose of Lasix 40mg  daily and Potassium (1 tab) daily.  An appointment has been made for lab draw on April 8th at 1:45pm  Your physician recommends that you schedule a follow-up appointment in: 2 months in [facility] office on Wednesday, May 27th at 2pm.    Please call office if your weight is not coming down or you are having any concerns at home.

## 2018-04-12 NOTE — Progress Notes (Signed)
Heart Failure TeleHealth Note  Due to national recommendations of social distancing due to COVID 19, Audio/video telehealth visit is felt to be most appropriate for this patient at this time.  See MyChart message from today for patient consent regarding telehealth for [facility].  Date:  04/12/2018   ID:  Joshua May, DOB [birth year], MRN [id number]  Location: Home  Provider location: [address], [place] Type of Visit: Established Heart Failure  PCP:  System, Pcp Not In  Cardiologist:  Rollene Rotunda, MD Primary HF: Dr Shirlee Latch EP: Dr Elberta Fortis.   Chief Complaint: Heart Failure/Weight gain   History of Present Illness: Joshua May is a 43 y.o. male who presents via audio/video conferencing for a telehealth visit today.     Joshua May has a history of history of CAD dating back to 2007 when he had a PCI in [place]. In 2010 he presented with a STEMI and had an RCA DES placed. He followed up for a year but then didn't present again till Oct 2017 when he presented with a CFX infarct. Cath revealed 50% ISR of the RCA, total mCFX and 85% OM3. He had CFX PCI with DES and OM3 POBA. His EF then was 50-55%.   Admitted 06/2017 with scheduled MV repair. S/P Minimally invasive mitral valve repair on 6/19. ECHO 07/12/17 post surgery, no Joshua was noted but EF remained 25-30%. Tachycardic at the time of discharge. Discharge weight 257 pounds.   He had an ECHO 10/05/18 with EF ~ 30%. He was referred to EP for ICD. Admitted 11/18/17 for implantation of single chamber MDT ICD.   Today, he denies symptoms of palpitations, chest pain, lower extremity edema, claudication, dizziness, presyncope, syncope, or bleeding. He reports SOB with steps, + Bendopnea and  + Orthopnea.  Says he gains fluid in his abdomen. Weight at home 275-280 pounds. Wieght has been going up over the last month.  The patient is tolerating medications without difficulties. Drinking > 2 liters. Eating some fast food. Lives with his  wife.   He denies symptoms of cough, fevers, chills, or new SOB worrisome for COVID 19.    Past Medical History:  Diagnosis Date  . Asthma   . CAD (coronary artery disease)   . Chronic systolic CHF (congestive heart failure) (HCC)   . HLD (hyperlipidemia)    Qualifier: Diagnosis of  By: Antoine Poche, MD, Gerrit Heck    . Hypercholesteremia   . Inferior myocardial infarction [facility]) 2007   PCI of RCA  . Inferior myocardial infarction (HCC) 03/31/2008   PCI of RCA with bare metal stent  . Ischemic cardiomyopathy   . Migraines    and dizziness  . Mitral regurgitation   . Obstructive sleep apnea    Qualifier: Diagnosis of  By: Antoine Poche, MD, Gerrit Heck    . 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 Pulmonary embolism (HCC) 02/28/2017  . 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 Tobacco abuse    Past Surgical History:  Procedure Laterality Date  . CARDIAC CATHETERIZATION N/A 10/20/2015   Procedure: Left Heart Cath and Coronary Angiography;  Surgeon: Kathleene Hazel, MD;  Location[facility] INVASIVE CV LAB;  Service: Cardiovascular;  Laterality: N/A;  . CARDIAC CATHETERIZATION N/A 10/20/2015   Procedure: Coronary Stent Intervention;  Surgeon: Kathleene Hazel, MD;  Location: [facility] INVASIVE CV LAB;  Service:  Cardiovascular;  Laterality: N/A;  . ICD IMPLANT N/A 11/18/2017   Procedure: ICD IMPLANT;  Surgeon: Regan Lemming, MD;  Location: [facility] INVASIVE CV LAB;  Service: Cardiovascular;  Laterality: N/A;  . INTRAVASCULAR PRESSURE WIRE/FFR STUDY N/A 03/03/2017   Procedure: INTRAVASCULAR PRESSURE WIRE/FFR STUDY;  Surgeon: Marykay Lex, MD;  Location: [facility] INVASIVE CV LAB;  Service: Cardiovascular;  Laterality: N/A;  . LEFT HEART CATH AND CORONARY ANGIOGRAPHY N/A 03/03/2017   Procedure: LEFT HEART CATH AND CORONARY ANGIOGRAPHY;  Surgeon: Marykay Lex, MD;  Location: [facility] INVASIVE CV LAB;   Service: Cardiovascular;  Laterality: N/A;  . MITRAL VALVE REPAIR Right 07/07/2017   Procedure: MINIMALLY INVASIVE MITRAL VALVE REPAIR (MVR) using Carpentier-McCarthy Adams Ring size 26;  Surgeon: Purcell Nails, MD;  Location[facility] OR;  Service: Open Heart Surgery;  Laterality: Right;  . MULTIPLE EXTRACTIONS WITH ALVEOLOPLASTY N/A 05/13/2017   Procedure: Extraction of tooth #'s 2,15,16,17 with alveoloplasty and gross debridement of remaining teeth.;  Surgeon: Charlynne Pander, DDS;  Location: [facility] OR;  Service: Oral Surgery;  Laterality: N/A;  . none    . OTHER SURGICAL HISTORY     NONE  . PATENT FORAMEN OVALE(PFO) CLOSURE N/A 07/07/2017   Procedure: PATENT FORAMEN OVALE (PFO) CLOSURE;  Surgeon: Purcell Nails, MD;  Location: [facility] OR;  Service: Open Heart Surgery;  Laterality: N/A;  . RIGHT HEART CATH N/A 09/22/2017   Procedure: RIGHT HEART CATH;  Surgeon: Laurey Morale, MD;  Location: [facility] INVASIVE CV LAB;  Service: Cardiovascular;  Laterality: N/A;  . TEE WITHOUT CARDIOVERSION N/A 04/19/2017   Procedure: TRANSESOPHAGEAL ECHOCARDIOGRAM (TEE);  Surgeon: Laurey Morale, MD;  Location: [facility] ENDOSCOPY;  Service: Cardiovascular;  Laterality: N/A;  . TEE WITHOUT CARDIOVERSION N/A 07/07/2017   Procedure: TRANSESOPHAGEAL ECHOCARDIOGRAM (TEE);  Surgeon: Purcell Nails, MD;  Location: [facility] OR;  Service: Open Heart Surgery;  Laterality: N/A;     Current Outpatient Medications  Medication Sig Dispense Refill  . acetaminophen (TYLENOL) 500 MG tablet Take 1,000 mg by mouth daily as needed for moderate pain or headache.    Marland Kitchen atorvastatin (LIPITOR) 80 MG tablet Take 1 tablet (80 mg total) by mouth daily. 34 tablet 2  . carvedilol (COREG) 3.125 MG tablet TAKE 1 TABLET BY MOUTH 2 TIMES DAILY WITH A MEAL. 60 tablet 2  . digoxin (LANOXIN) 0.125 MG tablet TAKE 1 TABLET BY MOUTH DAILY. 30 tablet 2  . ferrous sulfate 325 (65 FE) MG tablet Take 1 tablet (325 mg total) by mouth daily with breakfast. For one month then  stop. 30 tablet 3  . folic acid (FOLVITE) 1 MG tablet Take 1 tablet (1 mg total) by mouth daily. For one month then stop.    . furosemide (LASIX) 40 MG tablet TAKE 1 TABLET (40 MG TOTAL) BY MOUTH DAILY. 34 tablet 0  . potassium chloride SA (K-DUR,KLOR-CON) 20 MEQ tablet TAKE 1 TABLET BY MOUTH DAILY. 30 tablet 2  . psyllium (METAMUCIL) 58.6 % powder Take 1 packet by mouth daily as needed (constipation).    . sacubitril-valsartan (ENTRESTO) 24-26 MG Take 1 tablet by mouth 2 (two) times daily. 60 tablet 11  . spironolactone (ALDACTONE) 25 MG tablet Take 1 tablet (25 mg total) by mouth daily. Take in the AM 30 tablet 3  . warfarin (COUMADIN) 7.5 MG tablet TAKE 1/2 TO 1 (ONE-HALF TO ONE) TABLET BY MOUTH  DAILY AS DIRECTED BY COUMADIN CLINIC 90 tablet 0   No current facility-administered medications for this encounter.  Allergies:   Shellfish allergy   Social History:  The patient  reports that he has quit smoking. His smoking use included cigarettes. He has a 8.50 pack-year smoking history. He has never used smokeless tobacco. He reports previous alcohol use. He reports previous drug use. Drug: Marijuana.   Family History:  The patient's family history includes Deep vein thrombosis in his father; Heart attack in his father; Heart attack (age of onset: 82) in his sister.   ROS:  Please see the history of present illness.   All other systems are personally reviewed and negative.   Exam:  (Video/Tele Health Call; Exam is subjective and or/visual.) General:  No resp difficulty. HEENT: Normal Neck: JVD Cor: regular pulse reported Lungs: Normal respiratory effort with conversation.  Abdomen: Non-distended. Pt denies tenderness with self palpation.  Extremities: Pt denies edema. Neuro: Alert & oriented x 3.   Recent Labs: 07/05/2017: ALT 32 07/08/2017: Magnesium 2.2 07/11/2017: B Natriuretic Peptide 567.1 10/27/2017: BUN 12; Creatinine, Ser 0.87; Hemoglobin 13.1; Platelets 363; Potassium 3.9;  Sodium 143   Personally reviewed   Wt Readings from Last 3 Encounters:  04/12/18 124.9 kg (275 lb 6.4 oz)  11/19/17 118.8 kg (262 lb)  10/27/17 120.4 kg (265 lb 6.4 oz)      Other studies personally reviewed: Additional studies/ records that were reviewed today include: ECHo 10/04/2017 EF 30-35%   ASSESSMENT AND PLAN:  1.  Chronic Systolic Heart Failure, ICM  -ECHO 09/2017 EF 30-35%. S/P MDT ICD NYHA III functional decline. Based on our conversation he sounds volume overloaded in the setting of increased fluid and sodium intake. .  Today I have asked him to take an extra 40 mg lasix and 20 meq of potassium for 2 days then go back to lasix 40 mg daily + 20 meq of potassium. I would like to get his weight back down under 270 pounds.  Continue current dose of carvedilol, entresto, spironolactone, and digoxin.  - He will need BMET 2-3 weeks.   2. CAD Stents in LCx and RCA. LHC 02/2017 showed nonobstructive disease.  No s/s ischemia - On statin, bb, and coumadin.   COVID screen The patient does not have any symptoms that suggest any further testing/ screening at this time.  Social distancing reinforced today.  Recommended follow-up:  Asked to call back in 7 days if he weight does not go back down to < 270 pounds.   Relevant cardiac medications were reviewed at length with the patient today.   The patient does not have concerns regarding their medications at this time.   The following changes were made today: See above.   Labs/ tests ordered today include: BMEt in 14 days.   Patient Risk: After full review of this patients clinical status, I feel that he is at moderate risk for cardiac decompensation at this time.  Today, I have spent 22 minutes with the patient with telehealth technology discussing medications changes, low salt food choices and limiting fluid intake to < 2 liters per day.     Waneta Martins, NP  04/12/2018 11:23 AM  [facility] [address] Heart [facility] [place] [address] [phone] (office) [phone] (fax)

## 2018-04-12 NOTE — Progress Notes (Signed)
Spoke with patient and instructions reviewed from televisit with NP.  All questions answered and appointments made to f/u in office. Both patient and wife states understanding of same.  Instructions sent to patient via mychart.  Pt aware of same.

## 2018-04-12 NOTE — Addendum Note (Signed)
Encounter addended by: Marisa Hua, RN on: 04/12/2018 12:40 PM  Actions taken: Visit diagnoses modified, Order list changed, Diagnosis association updated, Clinical Note Signed

## 2018-04-18 ENCOUNTER — Telehealth: Payer: Self-pay | Admitting: *Deleted

## 2018-04-18 NOTE — Telephone Encounter (Signed)
Called patient to let them know due to recent COVID19 CDC and Health Department Protocols, we are not seeing patients in the office on Tuesday in [place]. Unable to reach patient on both patient and patient's wife phone per HIPAA Restrictions notes.   LVM to call office pertaining his visit.

## 2018-04-19 ENCOUNTER — Encounter: Payer: Self-pay | Admitting: Cardiology

## 2018-04-20 MED FILL — CARVEDILOL 3.125 MG TABLET: 3.125 | 30 days supply | Qty: 60 | Fill #0

## 2018-04-20 MED FILL — POTASSIUM CHLORIDE CRYS ER: 20 | 30 days supply | Qty: 30 | Fill #0

## 2018-04-20 MED FILL — DIGOXIN 0.125 MG TABLET: 125 | 30 days supply | Qty: 30 | Fill #0

## 2018-04-25 ENCOUNTER — Other Ambulatory Visit: Payer: Self-pay | Admitting: *Deleted

## 2018-04-25 MED ORDER — WARFARIN SODIUM 7.5 MG PO TABS: ORAL_TABLET | ORAL | 1 refills | Status: DC

## 2018-04-27 ENCOUNTER — Other Ambulatory Visit (HOSPITAL_COMMUNITY): Payer: Self-pay

## 2018-05-04 ENCOUNTER — Telehealth (HOSPITAL_COMMUNITY): Payer: Self-pay | Admitting: Licensed Clinical Social Worker

## 2018-05-04 MED FILL — ATORVASTATIN 80 MG TABLET: 80 | 34 days supply | Qty: 34 | Fill #0 | Status: TO

## 2018-05-04 NOTE — Telephone Encounter (Signed)
CSW contacted patient to check and make sure of adequate food and medications. Patient denies any concerns currently and appreciative of the call. CSW provided support and encouraged patient to return call if needed. Lasandra Beech, LCSW, CCSW-MCS [phone]

## 2018-05-11 ENCOUNTER — Other Ambulatory Visit: Payer: Self-pay

## 2018-05-11 ENCOUNTER — Ambulatory Visit (INDEPENDENT_AMBULATORY_CARE_PROVIDER_SITE_OTHER): Payer: Self-pay | Admitting: *Deleted

## 2018-05-11 DIAGNOSIS — I255 Ischemic cardiomyopathy: Secondary | ICD-10-CM

## 2018-05-11 LAB — CUP PACEART REMOTE DEVICE CHECK
Battery Remaining Longevity: 135 mo
Battery Voltage: 3.09 V
Brady Statistic RV Percent Paced: 0 %
Date Time Interrogation Session: 20200422083823
HighPow Impedance: 66 Ohm
Implantable Lead Implant Date: 20191031
Implantable Lead Location: 753860
Implantable Lead Model: 6935
Implantable Pulse Generator Implant Date: 20191031
Lead Channel Impedance Value: 361 Ohm
Lead Channel Impedance Value: 456 Ohm
Lead Channel Pacing Threshold Amplitude: 0.625 V
Lead Channel Pacing Threshold Pulse Width: 0.4 ms
Lead Channel Sensing Intrinsic Amplitude: 10.75 mV
Lead Channel Sensing Intrinsic Amplitude: 10.75 mV
Lead Channel Setting Pacing Amplitude: 2 V
Lead Channel Setting Pacing Pulse Width: 0.4 ms
Lead Channel Setting Sensing Sensitivity: 0.3 mV

## 2018-05-19 ENCOUNTER — Telehealth: Payer: Self-pay

## 2018-05-19 ENCOUNTER — Encounter: Payer: Self-pay | Admitting: Cardiology

## 2018-05-19 MED FILL — CARVEDILOL 3.125 MG TABLET: 3.125 | 30 days supply | Qty: 60 | Fill #1

## 2018-05-19 MED FILL — DIGOXIN 0.125 MG TABLET: 125 | 30 days supply | Qty: 30 | Fill #1

## 2018-05-19 MED FILL — POTASSIUM CHLORIDE CRYS ER: 20 | 30 days supply | Qty: 30 | Fill #1

## 2018-05-19 MED FILL — SPIRONOLACTONE 25 MG TABS: 25 | 30 days supply | Qty: 30 | Fill #0 | Status: TO

## 2018-05-19 NOTE — Progress Notes (Signed)
Remote ICD transmission.   

## 2018-05-19 NOTE — Telephone Encounter (Signed)

## 2018-05-20 ENCOUNTER — Other Ambulatory Visit: Payer: Self-pay

## 2018-05-20 ENCOUNTER — Ambulatory Visit (INDEPENDENT_AMBULATORY_CARE_PROVIDER_SITE_OTHER): Payer: Self-pay | Admitting: *Deleted

## 2018-05-20 DIAGNOSIS — I340 Nonrheumatic mitral (valve) insufficiency: Secondary | ICD-10-CM

## 2018-05-20 DIAGNOSIS — D6862 Lupus anticoagulant syndrome: Secondary | ICD-10-CM

## 2018-05-20 DIAGNOSIS — Z9889 Other specified postprocedural states: Secondary | ICD-10-CM

## 2018-05-20 DIAGNOSIS — Z5181 Encounter for therapeutic drug level monitoring: Secondary | ICD-10-CM

## 2018-05-20 LAB — POCT INR: INR: 2.9 (ref 2.0–3.0)

## 2018-06-09 ENCOUNTER — Telehealth: Payer: Self-pay | Admitting: *Deleted

## 2018-06-09 NOTE — Telephone Encounter (Signed)
Virtual Visit Pre-Appointment Phone Call  "(Name), I am calling you today to discuss your upcoming appointment. We are currently trying to limit exposure to the virus that causes COVID-19 by seeing patients at home rather than in the office."  1. "What is the [place] phone number to call the day of the visit?" - include this in appointment notes  2. "Do you have or have access to (through a family member/friend) a smartphone with video capability that we can use for your visit?" a. If yes - list this number in appt notes as "cell" (if different from [place] phone #) and list the appointment type as a VIDEO visit in appointment notes b. If no - list the appointment type as a PHONE visit in appointment notes  3. Confirm consent - "In the setting of the current Covid19 crisis, you are scheduled for a (phone or video) visit with your provider on (date) at (time).  Just as we do with many in-office visits, in order for you to participate in this visit, we must obtain consent.  If you'd like, I can send this to your mychart (if signed up) or email for you to review.  Otherwise, I can obtain your verbal consent now.  All virtual visits are billed to your insurance company just like a normal visit would be.  By agreeing to a virtual visit, we'd like you to understand that the technology does not allow for your provider to perform an examination, and thus may limit your provider's ability to fully assess your condition. If your provider identifies any concerns that need to be evaluated in person, we will make arrangements to do so.  Finally, though the technology is pretty good, we cannot assure that it will always work on either your or our end, and in the setting of a video visit, we may have to convert it to a phone-only visit.  In either situation, we cannot ensure that we have a secure connection.  Are you willing to proceed?" STAFF: Did the patient verbally acknowledge consent to telehealth visit? Document  YES/NO here: YES  4. Advise patient to be prepared - "Two hours prior to your appointment, go ahead and check your blood pressure, pulse, oxygen saturation, and your weight (if you have the equipment to check those) and write them all down. When your visit starts, your provider will ask you for this information. If you have an Apple Watch or Kardia device, please plan to have heart rate information ready on the day of your appointment. Please have a pen and paper handy nearby the day of the visit as well."  5. Give patient instructions for MyChart download to smartphone OR Doximity/Doxy.me as below if video visit (depending on what platform provider is using)  6. Inform patient they will receive a phone call 15 minutes prior to their appointment time (may be from unknown caller ID) so they should be prepared to answer    TELEPHONE CALL NOTE  Joshua May has been deemed a candidate for a follow-up tele-health visit to limit community exposure during the Covid-19 pandemic. I spoke with the patient via phone to ensure availability of phone/video source, confirm preferred email & phone number, and discuss instructions and expectations.  I reminded Joshua May to be prepared with any vital sign and/or heart rhythm information that could potentially be obtained via home monitoring, at the time of his visit. I reminded Joshua May to expect a phone call prior to  his visit.  Joshua May 06/09/2018 4:40 PM   INSTRUCTIONS FOR DOWNLOADING THE MYCHART APP TO SMARTPHONE  - The patient must first make sure to have activated MyChart and know their login information - If Apple, go to [employer] Store and type in MyChart in the search bar and download the app. If Android, ask patient to go to [employer] Play Store and type in [facility]ardMyChart in the search bar and download the app. The app is free but as with any other app downloads, their phone may require them to verify saved payment information or Apple/Android  password.  - The patient will need to then log into the app with their MyChart username and password, and select [facility] as their healthcare provider to link the account. When it is time for your visit, go to the MyChart app, find appointments, and click Begin Video Visit. Be sure to Select Allow for your device to access the Microphone and Camera for your visit. You will then be connected, and your provider will be with you shortly.  **If they have any issues connecting, or need assistance please contact MyChart service desk [phone])**  **If using a computer, in order to ensure the [place] quality for their visit they will need to use either of the following Internet Browsers: [employer] Edge, or Google Chrome**  IF USING DOXIMITY or DOXY.ME - The patient will receive a link just prior to their visit by text.     FULL LENGTH CONSENT FOR TELE-HEALTH VISIT   I hereby voluntarily request, consent and authorize [facility] and its employed or contracted physicians, physician assistants, nurse practitioners or other licensed health care professionals (the Practitioner), to provide me with telemedicine health care services (the "Services") as deemed necessary by the treating Practitioner. I acknowledge and consent to receive the Services by the Practitioner via telemedicine. I understand that the telemedicine visit will involve communicating with the Practitioner through live audiovisual communication technology and the disclosure of certain medical information by electronic transmission. I acknowledge that I have been given the opportunity to request an in-person assessment or other available alternative prior to the telemedicine visit and am voluntarily participating in the telemedicine visit.  I understand that I have the right to withhold or withdraw my consent to the use of telemedicine in the course of my care at any time, without affecting my right to future care or treatment,  and that the Practitioner or I may terminate the telemedicine visit at any time. I understand that I have the right to inspect all information obtained and/or recorded in the course of the telemedicine visit and may receive copies of available information for a reasonable fee.  I understand that some of the potential risks of receiving the Services via telemedicine include:  Marland Kitchen. Delay or interruption in medical evaluation due to technological equipment failure or disruption; . Information transmitted may not be sufficient (e.g. poor resolution of images) to allow for appropriate medical decision making by the Practitioner; and/or  . In rare instances, security protocols could fail, causing a breach of personal health information.  Furthermore, I acknowledge that it is my responsibility to provide information about my medical history, conditions and care that is complete and accurate to the [place] of my ability. I acknowledge that Practitioner's advice, recommendations, and/or decision may be based on factors not within their control, such as incomplete or inaccurate data provided by me or distortions of diagnostic images or specimens that may result from electronic transmissions. I understand  that the practice of medicine is not an exact science and that Practitioner makes no warranties or guarantees regarding treatment outcomes. I acknowledge that I will receive a copy of this consent concurrently upon execution via email to the email address I last provided but may also request a printed copy by calling the office of [facility].    I understand that my insurance will be billed for this visit.   I have read or had this consent read to me. . I understand the contents of this consent, which adequately explains the benefits and risks of the Services being provided via telemedicine.  . I have been provided ample opportunity to ask questions regarding this consent and the Services and have had my questions  answered to my satisfaction. . I give my informed consent for the services to be provided through the use of telemedicine in my medical care  By participating in this telemedicine visit I agree to the above.

## 2018-06-10 ENCOUNTER — Other Ambulatory Visit (HOSPITAL_COMMUNITY): Payer: Self-pay | Admitting: Cardiology

## 2018-06-10 DIAGNOSIS — I5022 Chronic systolic (congestive) heart failure: Secondary | ICD-10-CM

## 2018-06-14 ENCOUNTER — Telehealth (INDEPENDENT_AMBULATORY_CARE_PROVIDER_SITE_OTHER): Payer: Self-pay | Admitting: Cardiology

## 2018-06-14 ENCOUNTER — Other Ambulatory Visit: Payer: Self-pay

## 2018-06-14 DIAGNOSIS — I5022 Chronic systolic (congestive) heart failure: Secondary | ICD-10-CM

## 2018-06-14 NOTE — Progress Notes (Signed)
Electrophysiology TeleHealth Note   Due to national recommendations of social distancing due to COVID 19, an audio/video telehealth visit is felt to be most appropriate for this patient at this time.  See Epic message for the patient's consent to telehealth for [facility].   Date:  06/14/2018   ID:  Joshua May, DOB 23-May-1975, MRN [id number]  Location: patient's home  Provider location: [address], [place]  Evaluation Performed: Follow-up visit  PCP:  System, Pcp Not In  Cardiologist:  Rollene Rotunda, MD  Electrophysiologist:  Dr Elberta Fortis  Chief Complaint:  CHF  History of Present Illness:    Joshua May is a 43 y.o. male who presents via audio/video conferencing for a telehealth visit today.  Since last being seen in our clinic, the patient reports doing very well.  Today, he denies symptoms of palpitations, chest pain, shortness of breath,  lower extremity edema, dizziness, presyncope, or syncope.  The patient is otherwise without complaint today.  The patient denies symptoms of fevers, chills, cough, or new SOB worrisome for COVID 19.  He has a history of coronary artery disease status post multiple MIs and stents, chronic systolic heart failure, and severe mitral regurgitation status post minimally invasive mitral valve repair.  He has a Medtronic ICD implanted 11/19/2017.  Today, denies symptoms of palpitations, chest pain, shortness of breath, orthopnea, PND, lower extremity edema, claudication, dizziness, presyncope, syncope, bleeding, or neurologic sequela. The patient is tolerating medications without difficulties.    Past Medical History:  Diagnosis Date  . Asthma   . CAD (coronary artery disease)   . Chronic systolic CHF (congestive heart failure) (HCC)   . HLD (hyperlipidemia)    Qualifier: Diagnosis of  By: Antoine Poche, MD, Gerrit Heck    . Hypercholesteremia   . Inferior myocardial infarction [facility]) 2007   PCI of RCA  . Inferior myocardial  infarction (HCC) 03/31/2008   PCI of RCA with bare metal stent  . Ischemic cardiomyopathy   . Migraines    and dizziness  . Mitral regurgitation   . Obstructive sleep apnea    Qualifier: Diagnosis of  By: Antoine Poche, MD, Gerrit Heck    . 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 Pulmonary embolism (HCC) 02/28/2017  . 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 Tobacco abuse     Past Surgical History:  Procedure Laterality Date  . CARDIAC CATHETERIZATION N/A 10/20/2015   Procedure: Left Heart Cath and Coronary Angiography;  Surgeon: Kathleene Hazel, MD;  [facility] INVAS[facility] CV LAB;  Service: Cardiovascular;  Laterality: N/A;  . CARDIAC CATHETERIZATION N/A 10/20/2015   Procedure: Coronary Stent Intervention;  Surgeon: Kathleene Hazel, MD;  Location: [facility] INVASIVE CV LAB;  Service: Cardiovascular;  Laterality: N/A;  . ICD IMPLANT N/A 11/18/2017   Procedure: ICD IMPLANT;  Surgeon: Regan Lemming, MD;  Location: [facility] INVASIVE CV LAB;  Service: Cardiovascular;  Laterality: N/A;  . INTRAVASCULAR PRESSURE WIRE/FFR STUDY N/A 03/03/2017   Procedure: INTRAVASCULAR PRESSURE WIRE/FFR STUDY;  Surgeon: Marykay Lex, MD;  [facility] CV LAB;  Service: Cardiovascular;  Laterality: N/A;  . LEFT HEART CATH AND CORONARY ANGIOGRAPHY N/A 03/03/2017   Procedure: LEFT HEART CATH AND CORONARY ANGIOGRAPHY;  Surgeon: Marykay Lex, MD;  Location: [facility] INVASIVE CV LAB;  Service: Cardiovascular;  Laterality: N/A;  . MITRAL VALVE REPAIR Right 07/07/2017  Procedure: MINIMALLY INVASIVE MITRAL VALVE REPAIR (MVR) using Carpentier-McCarthy Adams Ring size 26;  Surgeon: Purcell Nailswen, Clarence H, MD;  Location: [facility] OR;  Service: Open Heart Surgery;  Laterality: Right;  . MULTIPLE EXTRACTIONS WITH ALVEOLOPLASTY N/A 05/13/2017   Procedure: Extraction of tooth #'s 2,15,16,17 with alveoloplasty and gross debridement  of remaining teeth.;  Surgeon: Charlynne PanderKulinski, Ronald F, DDS;  [facility];  Service: Oral Surgery;  Laterality: N/A;  . none    . OTHER SURGICAL HISTORY     NONE  . PATENT FORAMEN OVALE(PFO) CLOSURE N/A 07/07/2017   Procedure: PATENT FORAMEN OVALE (PFO) CLOSURE;  Surgeon: Purcell Nailswen, Clarence H, MD;  [facility];  Service: Open Heart Surgery;  Laterality: N/A;  . RIGHT HEART CATH N/A 09/22/2017   Procedure: RIGHT HEART CATH;  Surgeon: Laurey MoraleMcLean, Dalton S, MD;  [facility];  Service: Cardiovascular;  Laterality: N/A;  . TEE WITHOUT CARDIOVERSION N/A 04/19/2017   Procedure: TRANSESOPHAGEAL ECHOCARDIOGRAM (TEE);  Surgeon: Laurey MoraleMcLean, Dalton S, MD;  [facility];  Service: Cardiovascular;  Laterality: N/A;  . TEE WITHOUT CARDIOVERSION N/A 07/07/2017   Procedure: TRANSESOPHAGEAL ECHOCARDIOGRAM (TEE);  Surgeon: Purcell Nailswen, Clarence H, MD;  [facility];  Service: Open Heart Surgery;  Laterality: N/A;    Current Outpatient Medications  Medication Sig Dispense Refill  . acetaminophen (TYLENOL) 500 MG tablet Take 1,000 mg by mouth daily as needed for moderate pain or headache.    Marland Kitchen. atorvastatin (LIPITOR) 80 MG tablet Take 1 tablet (80 mg total) by mouth daily. 34 tablet 2  . carvedilol (COREG) 3.125 MG tablet TAKE 1 TABLET BY MOUTH 2 TIMES DAILY WITH A MEAL. 60 tablet 12  . digoxin (LANOXIN) 0.125 MG tablet TAKE 1 TABLET BY MOUTH DAILY. 30 tablet 2  . ferrous sulfate 325 (65 FE) MG tablet Take 1 tablet (325 mg total) by mouth daily with breakfast. For one month then stop. 30 tablet 3  . folic acid (FOLVITE) 1 MG tablet Take 1 tablet (1 mg total) by mouth daily. For one month then stop.    . furosemide (LASIX) 40 MG tablet TAKE 1 TABLET (40 MG TOTAL) BY MOUTH DAILY. 34 tablet 0  . potassium chloride SA (K-DUR,KLOR-CON) 20 MEQ tablet TAKE 1 TABLET BY MOUTH DAILY. 30 tablet 2  . psyllium (METAMUCIL) 58.6 % powder Take 1 packet by mouth daily as needed (constipation).    . sacubitril-valsartan  (ENTRESTO) 24-26 MG Take 1 tablet by mouth 2 (two) times daily. 60 tablet 11  . spironolactone (ALDACTONE) 25 MG tablet Take 1 tablet (25 mg total) by mouth daily. Take in the AM 30 tablet 3  . warfarin (COUMADIN) 7.5 MG tablet TAKE 1/2 TO 1 (ONE-HALF TO ONE) TABLET BY MOUTH  DAILY AS DIRECTED BY COUMADIN CLINIC 90 tablet 1   No current facility-administered medications for this visit.     Allergies:   Shellfish allergy   Social History:  The patient  reports that he has quit smoking. His smoking use included cigarettes. He has a 8.50 pack-year smoking history. He has never used smokeless tobacco. He reports previous alcohol use. He reports previous drug use. Drug: Marijuana.   Family History:  The patient's  family history includes Deep vein thrombosis in his father; Heart attack in his father; Heart attack (age of onset: 7830) in his sister.   ROS:  Please see the history of present illness.   All other systems are personally reviewed and negative.    Exam:    Vital  Signs:  There were no vitals taken for this visit.  Over the phone, no acute distress, no shortness of breath.  Labs/Other Tests and Data Reviewed:    Recent Labs: 07/05/2017: ALT 32 07/08/2017: Magnesium 2.2 07/11/2017: B Natriuretic Peptide 567.1 10/27/2017: BUN 12; Creatinine, Ser 0.87; Hemoglobin 13.1; Platelets 363; Potassium 3.9; Sodium 143   Wt Readings from Last 3 Encounters:  04/12/18 275 lb 6.4 oz (124.9 kg)  11/19/17 262 lb (118.8 kg)  10/27/17 265 lb 6.4 oz (120.4 kg)     Other studies personally reviewed: Additional studies/ records that were reviewed today include: ECG 11/19/2017 personally reviewed Review of the above records today demonstrates: Sinus rhythm, inferior infarct  Last device remote is reviewed from PaceART PDF dated 05/11/2018 which reveals normal device function, 11 beats nonsustained VT   ASSESSMENT & PLAN:    1.  Chronic systolic heart failure due to ischemic cardiomyopathy: Currently  on optimal medical therapy with Aldactone, Coreg, Entresto.  Status post Medtronic ICD implanted 11/18/2017.  Device functioning appropriately.  No changes.  2.  Coronary artery disease: Status post circumflex and RCA stents.  No current chest pain  3.  Mitral vegetation status post minimally invasive mitral valve repair: Plan per primary cardiology   COVID 19 screen The patient denies symptoms of COVID 19 at this time.  The importance of social distancing was discussed today.  Follow-up: 9 months Next remote: 08/10/2018  Current medicines are reviewed at length with the patient today.   The patient does not have concerns regarding his medicines.  The following changes were made today:  none  Labs/ tests ordered today include:  No orders of the defined types were placed in this encounter.    Patient Risk:  after full review of this patients clinical status, I feel that they are at moderate risk at this time.  Today, I have spent 8 minutes with the patient with telehealth technology discussing CHF, ICD.    Signed, Galen Malkowski [name], MD  06/14/2018 2:29 PM     [facility] [address] [place] [address] [phone] (office) [phone] (fax)

## 2018-06-15 ENCOUNTER — Encounter (HOSPITAL_COMMUNITY): Payer: Self-pay

## 2018-06-15 ENCOUNTER — Other Ambulatory Visit (HOSPITAL_COMMUNITY): Payer: Self-pay | Admitting: Cardiology

## 2018-06-15 DIAGNOSIS — I5022 Chronic systolic (congestive) heart failure: Secondary | ICD-10-CM

## 2018-06-16 MED FILL — CARVEDILOL 3.125 MG TABLET: 3.125 | 30 days supply | Qty: 60 | Fill #0

## 2018-06-16 MED FILL — ATORVASTATIN 80 MG TABLET: 80 | 34 days supply | Qty: 34 | Fill #0

## 2018-06-23 ENCOUNTER — Other Ambulatory Visit: Payer: Self-pay

## 2018-06-23 ENCOUNTER — Telehealth: Payer: Self-pay

## 2018-06-23 ENCOUNTER — Encounter (HOSPITAL_COMMUNITY): Payer: Self-pay

## 2018-06-23 ENCOUNTER — Ambulatory Visit (HOSPITAL_COMMUNITY)
Admission: RE | Admit: 2018-06-23 | Discharge: 2018-06-23 | Disposition: A | Discharge status: Home / Self Care | Payer: Self-pay | Source: Ambulatory Visit | Attending: Adult Health | Admitting: Adult Health

## 2018-06-23 VITALS — BP 128/88 | HR 84 | Wt 274.6 lb

## 2018-06-23 DIAGNOSIS — E785 Hyperlipidemia, unspecified: Secondary | ICD-10-CM | POA: Insufficient documentation

## 2018-06-23 DIAGNOSIS — Z8249 Family history of ischemic heart disease and other diseases of the circulatory system: Secondary | ICD-10-CM | POA: Insufficient documentation

## 2018-06-23 DIAGNOSIS — Z79899 Other long term (current) drug therapy: Secondary | ICD-10-CM | POA: Insufficient documentation

## 2018-06-23 DIAGNOSIS — I255 Ischemic cardiomyopathy: Secondary | ICD-10-CM | POA: Insufficient documentation

## 2018-06-23 DIAGNOSIS — Z87891 Personal history of nicotine dependence: Secondary | ICD-10-CM | POA: Insufficient documentation

## 2018-06-23 DIAGNOSIS — I340 Nonrheumatic mitral (valve) insufficiency: Secondary | ICD-10-CM

## 2018-06-23 DIAGNOSIS — Z955 Presence of coronary angioplasty implant and graft: Secondary | ICD-10-CM | POA: Insufficient documentation

## 2018-06-23 DIAGNOSIS — I5022 Chronic systolic (congestive) heart failure: Secondary | ICD-10-CM | POA: Insufficient documentation

## 2018-06-23 DIAGNOSIS — Z7901 Long term (current) use of anticoagulants: Secondary | ICD-10-CM | POA: Insufficient documentation

## 2018-06-23 DIAGNOSIS — I2510 Atherosclerotic heart disease of native coronary artery without angina pectoris: Secondary | ICD-10-CM | POA: Insufficient documentation

## 2018-06-23 DIAGNOSIS — I252 Old myocardial infarction: Secondary | ICD-10-CM | POA: Insufficient documentation

## 2018-06-23 LAB — BASIC METABOLIC PANEL
Anion gap: 7 (ref 5–15)
BUN: 11 mg/dL (ref 6–20)
CO2: 26 mmol/L (ref 22–32)
Calcium: 9 mg/dL (ref 8.9–10.3)
Chloride: 105 mmol/L (ref 98–111)
Creatinine, Ser: 1 mg/dL (ref 0.61–1.24)
GFR calc Af Amer: >60 mL/min (ref >60)
GFR calc non Af Amer: >60 mL/min (ref >60)
Glucose, Bld: 107 mg/dL — ABNORMAL HIGH (ref 70–99)
Potassium: 4.6 mmol/L (ref 3.5–5.1)
Sodium: 138 mmol/L (ref 135–145)

## 2018-06-23 NOTE — Telephone Encounter (Signed)

## 2018-06-23 NOTE — Progress Notes (Signed)
PCP: None  HF Cardiology: Dr Shirlee Latch  CT Surgery: Dr Cornelius Moras  HPI: Joshua May is a 43 y.o. [place] male with a history of CAD dating back to 2007 when he had a PCI in [place]. In 2010 he presented with a STEMI and had an RCA DES placed. He followed up for a year but then didn't present again till Oct 2017 when he presented with a CFX infarct. Cath revealed 50% ISR of the RCA, total mCFX and 85% OM3. He had CFX PCI with DES and OM3 POBA. His EF then was 50-55%.  Admitted 02/27/17 with increased dyspnea and chest pain. CTA confirmed PE. ECHO completed and showed reduced EF 20-25%. Underwent LHC as noted below. [place] no evidence DVTs. CT surgery consulted for severe MR. Plan to optimize HF medications.Set up TEE as an outpatient. Discharge weight 222 pounds.   Admitted 06/2017 with scheduled MV repair. S/P Minimally invasive mitral valve repair on 6/19. ECHO 07/12/17 post surgery, no MR was noted but EF remained 25-30%. Tachycardic at the time of discharge. Discharge weight 257 pounds.   CPX was submaximal in 8/19, suggestive of marked deconditioning.  RHC in 9/19 showed relatively preserved cardiac index with only mildly elevated filling pressures.  Echo 10/05/18, EF 30-35%.   Today he returns for HF follow up. Overall feeling fine. Remains SOB with steps. Denies PND/Orthopnea. Says he is not very active at home. Not exercising. No bleeding issues. No chest pain. Appetite ok. No fever or chills. Weight at home 271  pounds. Taking all medications.  Medtronic  Activity ~2 hours, impedance up. No VT/A Fib.  PMH: 1. CAD: Initial PCI in 2007 in [place], inferior MI.   - Inferior STEMI 2010 with RCA PCI.  - CFx infarct in 2017 with LCx DES and OM3 POBA.  - LHC (2/19): Nonobstructive CAD.  2. 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  - TEE (4/19): LVEF 35-40%, Severe MR with PISA ERO 0.43 cm^2. Suspect infarct related MR with tethering of the posterior leaflet.  - Minimally invasive MV repair in 6/19.   3.  Chronic systolic CHF: Ischemic cardiomyopathy.  - Echo (2/19): EF 25-30%, diffuse HK with inferolateral AK, mildly decreased RV systolic function with D-shaped septum, moderate-severe infarct-related MR.  - Echo (6/19): EF 25-30%, apical and inferolateral hypokinesis, MV repair with mean gradient 9 mmHg with no MR, mildly decreased RV systolic function.  - CPX (8/19): VO2 13.2, VE/VCO2 slope 28, RER 0.87.  This study was suggestive of severe deconditioning.  - RHC (9/19): mean RA 9, PA 39/23 mean 24, mean PCWP 17, CI 2.2, PVR 1.35 WU - Echo (9/19): EF 30-35%, diffuse hypokinesis, normal RV, s/p MV repair with mean gradient 6 mmHg and PHT 104 msec => probably no significant stenosis.  4. Hyperlipidemia 5. PE: 2/19.   Review of systems complete and found to be negative unless listed in HPI.    Social History   Socioeconomic History  . Marital status: Married    Spouse name: Not on file  . Number of children: Not on file  . Years of education: Not on file  . Highest education level: Not on file  Occupational History  . Not on file  Social Needs  . Financial resource strain: Not on file  . Food insecurity:    Worry: Not on file    Inability: Not on file  . Transportation needs:    Medical: Not on file    Non-medical: Not on file  Tobacco Use  .  Smoking status: Former Smoker    Packs/day: 0.50    Years: 17.00    Pack years: 8.50    Types: Cigarettes  . Smokeless tobacco: Never Used  . Tobacco comment: quit since heart attack in 10/2015  Substance and Sexual Activity  . Alcohol use: Not Currently  . Drug use: Not Currently    Types: Marijuana  . Sexual activity: Not on file  Lifestyle  . Physical activity:    Days per week: Not on file    Minutes per session: Not on file  . Stress: Not on file  Relationships  . Social connections:    Talks on phone: Not on file    Gets together: Not on file    Attends religious service: Not on file    Active member of club or  organization: Not on file    Attends meetings of clubs or organizations: Not on file    Relationship status: Not on file  . Intimate partner violence:    Fear of current or ex partner: Not on file    Emotionally abused: Not on file    Physically abused: Not on file    Forced sexual activity: Not on file  Other Topics Concern  . Not on file  Social History Narrative   The patient is married with two children.   Family History  Problem Relation Age of Onset  . Heart attack Sister 38       S/P CABG  . Heart attack Father   . Deep vein thrombosis Father     Current Outpatient Medications  Medication Sig Dispense Refill  . acetaminophen (TYLENOL) 500 MG tablet Take 1,000 mg by mouth daily as needed for moderate pain or headache.    Marland Kitchen atorvastatin (LIPITOR) 80 MG tablet Take 1 tablet (80 mg total) by mouth daily. 34 tablet 2  . carvedilol (COREG) 3.125 MG tablet TAKE 1 TABLET BY MOUTH 2 TIMES DAILY WITH A MEAL. 60 tablet 1  . digoxin (LANOXIN) 0.125 MG tablet TAKE 1 TABLET BY MOUTH DAILY. 30 tablet 2  . ferrous sulfate 325 (65 FE) MG tablet Take 1 tablet (325 mg total) by mouth daily with breakfast. For one month then stop. 30 tablet 3  . folic acid (FOLVITE) 1 MG tablet Take 1 tablet (1 mg total) by mouth daily. For one month then stop.    . furosemide (LASIX) 40 MG tablet TAKE 1 TABLET (40 MG TOTAL) BY MOUTH DAILY. 34 tablet 0  . potassium chloride SA (K-DUR,KLOR-CON) 20 MEQ tablet TAKE 1 TABLET BY MOUTH DAILY. 30 tablet 2  . psyllium (METAMUCIL) 58.6 % powder Take 1 packet by mouth daily as needed (constipation).    . sacubitril-valsartan (ENTRESTO) 24-26 MG Take 1 tablet by mouth 2 (two) times daily. 60 tablet 11  . spironolactone (ALDACTONE) 25 MG tablet Take 1 tablet (25 mg total) by mouth daily. Take in the AM 30 tablet 3  . warfarin (COUMADIN) 7.5 MG tablet TAKE 1/2 TO 1 (ONE-HALF TO ONE) TABLET BY MOUTH  DAILY AS DIRECTED BY COUMADIN CLINIC 90 tablet 1   No current  facility-administered medications for this encounter.    Vitals:   06/23/18 1132  BP: 128/88  Pulse: 84  SpO2: 97%  Weight: 124.6 kg (274 lb 9.6 oz)    Wt Readings from Last 3 Encounters:  06/23/18 124.6 kg (274 lb 9.6 oz)  04/12/18 124.9 kg (275 lb 6.4 oz)  11/19/17 118.8 kg (262 lb)    PHYSICAL  EXAM: General:  Well appearing. No resp difficulty HEENT: normal Neck: supple. no JVD. Carotids 2+ bilat; no bruits. No lymphadenopathy or thryomegaly appreciated. Cor: PMI nondisplaced. Regular rate & rhythm. No rubs, gallops or murmurs. Lungs: clear Abdomen: soft, nontender, nondistended. No hepatosplenomegaly. No bruits or masses. Good bowel sounds. Extremities: no cyanosis, clubbing, rash, edema Neuro: alert & orientedx3, cranial nerves grossly intact. moves all 4 extremities w/o difficulty. Affect pleasant  ASSESSMENT & PLAN: 1. Chronic systolic CHF: - Ischemic cardiomyopathy. Echo in 6/19 with EF 25-30%, echo done today with EF 30-35%.    RHC in 9/19 showed only mild fluid overload and cardiac index was relatively preserved at 2.2. CPX showed marked deconditioning.   - Medtronic ICD. No VT/A fib. Activity ~2 hour. Impedance elevated. - Volume status stable. Continue lasix 40 mg daily.  - Continue digoxin 0.125.   - Continue spironolactone 25 mg daily.   -Continue coreg 3.125 mg twice a day.   - Continue Entresto 24/26 mg BID.  - Check BMET  2. CAD:  - Stents in LCx and RCA. LHC 02/2017 showed nonobstructive disease.  -No chest pain.    - Continue atorvastatin, good lipids 7/19. - He is on warfarin so no ASA 81.  3. PE:  - In 2019, no definite trigger. Father with h/o VTE.  He should likely have long-term anticoagulation.   - Continue coumadin. Denies bleeding.  4. Mitral regurgitation s/p minimally invasive MV repair in 6/19.  - Post-op echo 6/19 showed no MR but mean gradient 9 mmHg across MV. Echo (9/19) with mean MV gradient 6 mmHg but PHT short at 104 msec, probably no  significant stenosis.  5. Former smoker:  - Encouraged continued abstinence.  6. PAD:  - Needs peripheral arterial doppler evaluation to assess for PAD.   Follow up in 4 months with an ECHO.   Tonye BecketAmy , NP  06/23/2018

## 2018-06-23 NOTE — Patient Instructions (Signed)
Labs were done today. We will call you with any ABNORMAL results. No news is good news!  Your physician has requested that you have an echocardiogram. Echocardiography is a painless test that uses sound waves to create images of your heart. It provides your doctor with information about the size and shape of your heart and how well your heart's chambers and valves are working. This procedure takes approximately one hour. There are no restrictions for this procedure.  Your physician recommends that you schedule a follow-up appointment in: 4 months, the office will call you to schedule this appointment with you.

## 2018-06-27 ENCOUNTER — Encounter: Payer: Self-pay | Admitting: Thoracic Surgery (Cardiothoracic Vascular Surgery)

## 2018-06-28 ENCOUNTER — Other Ambulatory Visit (HOSPITAL_COMMUNITY): Payer: Self-pay | Admitting: Student

## 2018-06-28 ENCOUNTER — Other Ambulatory Visit (HOSPITAL_COMMUNITY): Payer: Self-pay | Admitting: Cardiology

## 2018-06-28 MED FILL — SPIRONOLACTONE 25 MG TABLET: 25 | 30 days supply | Qty: 30 | Fill #0

## 2018-06-29 MED ORDER — DIGOXIN 125 MCG PO TABS: 125.0000 ug | ORAL_TABLET | Freq: Every day | ORAL | 6 refills | Status: DC

## 2018-06-29 MED ORDER — POTASSIUM CHLORIDE CRYS ER 20 MEQ PO TBCR: 20.0000 meq | EXTENDED_RELEASE_TABLET | Freq: Every day | ORAL | 6 refills | Status: DC

## 2018-06-29 MED FILL — POTASSIUM CHLORIDE CRYS ER: 20 | 30 days supply | Qty: 30 | Fill #0

## 2018-06-29 MED FILL — DIGOXIN 0.125 MG TABLET: 125 | 30 days supply | Qty: 30 | Fill #0

## 2018-06-29 NOTE — Addendum Note (Signed)
Addended by: Valeda Malm on: 06/29/2018 08:34 AM   Modules accepted: Orders

## 2018-06-29 NOTE — Addendum Note (Signed)
Addended by: Valeda Malm on: 06/29/2018 09:15 AM   Modules accepted: Orders

## 2018-06-29 NOTE — Telephone Encounter (Signed)
HF fund added to script for potassium and resent.

## 2018-06-29 NOTE — Telephone Encounter (Signed)
Received call from pharmacy stating script missing HF FUND note. Added, and resent!

## 2018-07-04 ENCOUNTER — Encounter: Payer: Self-pay | Admitting: Thoracic Surgery (Cardiothoracic Vascular Surgery)

## 2018-07-04 ENCOUNTER — Encounter: Payer: Medicaid Other | Admitting: Thoracic Surgery (Cardiothoracic Vascular Surgery)

## 2018-07-05 ENCOUNTER — Encounter: Payer: Self-pay | Admitting: Thoracic Surgery (Cardiothoracic Vascular Surgery)

## 2018-07-07 MED FILL — CARVEDILOL 3.125 MG TABLET: 3.125 | 30 days supply | Qty: 60 | Fill #1

## 2018-07-22 MED FILL — DIGOXIN 0.125 MG TABLET: 125 | 30 days supply | Qty: 30 | Fill #1

## 2018-07-22 MED FILL — POTASSIUM CHLORIDE CRYS ER: 20 | 30 days supply | Qty: 30 | Fill #1

## 2018-07-22 MED FILL — ATORVASTATIN 80 MG TABLET: 80 | 34 days supply | Qty: 34 | Fill #1

## 2018-07-29 ENCOUNTER — Telehealth (HOSPITAL_COMMUNITY): Payer: Self-pay | Admitting: Licensed Clinical Social Worker

## 2018-07-29 ENCOUNTER — Other Ambulatory Visit (HOSPITAL_COMMUNITY): Payer: Self-pay | Admitting: Cardiology

## 2018-07-29 DIAGNOSIS — I5022 Chronic systolic (congestive) heart failure: Secondary | ICD-10-CM

## 2018-07-29 MED FILL — SPIRONOLACTONE 25 MG TABLET: 25 | 30 days supply | Qty: 30 | Fill #1

## 2018-07-29 MED FILL — CARVEDILOL 3.125 MG TABLET: 3.125 | 30 days supply | Qty: 60 | Fill #0

## 2018-07-29 NOTE — Telephone Encounter (Signed)
CSW contacted patient to follow up on [facility] and supportive needs. Message left for return call. Jackie Maleak Brazzel, LCSW, CCSW-MCS [phone] 

## 2018-08-01 ENCOUNTER — Other Ambulatory Visit (HOSPITAL_COMMUNITY): Payer: Self-pay

## 2018-08-01 DIAGNOSIS — I5022 Chronic systolic (congestive) heart failure: Secondary | ICD-10-CM

## 2018-08-01 MED ORDER — CARVEDILOL 3.125 MG PO TABS: ORAL_TABLET | ORAL | 1 refills | Status: DC

## 2018-08-10 ENCOUNTER — Ambulatory Visit (INDEPENDENT_AMBULATORY_CARE_PROVIDER_SITE_OTHER): Payer: Self-pay | Admitting: *Deleted

## 2018-08-10 DIAGNOSIS — I255 Ischemic cardiomyopathy: Secondary | ICD-10-CM

## 2018-08-10 DIAGNOSIS — I5022 Chronic systolic (congestive) heart failure: Secondary | ICD-10-CM

## 2018-08-10 LAB — CUP PACEART REMOTE DEVICE CHECK
Battery Remaining Longevity: 134 mo
Battery Voltage: 3.06 V
Brady Statistic RV Percent Paced: 0 %
Date Time Interrogation Session: 20200722083823
HighPow Impedance: 64 Ohm
Implantable Lead Implant Date: 20191031
Implantable Lead Location: 753860
Implantable Lead Model: 6935
Implantable Pulse Generator Implant Date: 20191031
Lead Channel Impedance Value: 342 Ohm
Lead Channel Impedance Value: 456 Ohm
Lead Channel Pacing Threshold Amplitude: 0.625 V
Lead Channel Pacing Threshold Pulse Width: 0.4 ms
Lead Channel Sensing Intrinsic Amplitude: 12 mV
Lead Channel Sensing Intrinsic Amplitude: 12 mV
Lead Channel Setting Pacing Amplitude: 2 V
Lead Channel Setting Pacing Pulse Width: 0.4 ms
Lead Channel Setting Sensing Sensitivity: 0.3 mV

## 2018-08-19 MED FILL — CARVEDILOL 3.125 MG TABLET: 3.125 | 30 days supply | Qty: 60 | Fill #1

## 2018-08-24 NOTE — Progress Notes (Signed)
Remote ICD transmission.   

## 2018-08-26 ENCOUNTER — Other Ambulatory Visit (HOSPITAL_COMMUNITY): Payer: Self-pay | Admitting: Cardiology

## 2018-08-26 MED FILL — POTASSIUM CHLORIDE CRYS ER: 20 | 30 days supply | Qty: 30 | Fill #2

## 2018-08-26 MED FILL — SPIRONOLACTONE 25 MG TABLET: 25 | 30 days supply | Qty: 30 | Fill #0

## 2018-08-26 MED FILL — DIGOXIN 0.125 MG TABLET: 125 | 30 days supply | Qty: 30 | Fill #2

## 2018-08-26 MED FILL — ATORVASTATIN 80 MG TABLET: 80 | 34 days supply | Qty: 34 | Fill #1

## 2018-08-29 ENCOUNTER — Other Ambulatory Visit (INDEPENDENT_AMBULATORY_CARE_PROVIDER_SITE_OTHER): Payer: Self-pay

## 2018-08-29 DIAGNOSIS — Z5181 Encounter for therapeutic drug level monitoring: Secondary | ICD-10-CM

## 2018-08-30 ENCOUNTER — Other Ambulatory Visit: Payer: Self-pay

## 2018-08-30 ENCOUNTER — Ambulatory Visit: Payer: Self-pay | Admitting: *Deleted

## 2018-08-30 DIAGNOSIS — Z9889 Other specified postprocedural states: Secondary | ICD-10-CM

## 2018-08-30 DIAGNOSIS — D6862 Lupus anticoagulant syndrome: Secondary | ICD-10-CM

## 2018-08-30 DIAGNOSIS — Z5181 Encounter for therapeutic drug level monitoring: Secondary | ICD-10-CM

## 2018-08-30 LAB — POCT INR: INR: 2.5 (ref 2.0–3.0)

## 2018-08-30 NOTE — Patient Instructions (Signed)
Description   Continue taking 1 tablet daily except 1/2 tablet on Mondays, Wednesdays, and Fridays.  Recheck INR in 6 weeks. Call with any new medications or procedures [facility] [phone] Main [phone]

## 2018-09-07 ENCOUNTER — Telehealth (HOSPITAL_COMMUNITY): Payer: Self-pay | Admitting: Licensed Clinical Social Worker

## 2018-09-07 NOTE — Telephone Encounter (Signed)
CSW contacted patient to follow up and offer support due to Covid-19 and inability to meet with [facility].  Patient states he is doing well and managing his health needs. CSW offered support and encouragement. Patient denies any concerns and looking forward to getting together someday soon with the [facility]. Raquel Sarna, [place], [place]

## 2018-09-12 ENCOUNTER — Other Ambulatory Visit (HOSPITAL_COMMUNITY): Payer: Self-pay | Admitting: Cardiology

## 2018-09-12 DIAGNOSIS — I5022 Chronic systolic (congestive) heart failure: Secondary | ICD-10-CM

## 2018-09-12 MED FILL — FUROSEMIDE 40 MG TAB: 40 | 34 days supply | Qty: 34 | Fill #0

## 2018-09-12 MED FILL — CARVEDILOL 3.125 MG TABLET: 3.125 | 30 days supply | Qty: 60 | Fill #0

## 2018-09-28 ENCOUNTER — Other Ambulatory Visit (HOSPITAL_COMMUNITY): Payer: Self-pay

## 2018-09-28 MED ORDER — SACUBITRIL-VALSARTAN 24-26 MG PO TABS: 1.0000 | ORAL_TABLET | Freq: Two times a day (BID) | ORAL | 11 refills | Status: DC

## 2018-09-28 MED FILL — ATORVASTATIN 80 MG TABLET: 80 | 34 days supply | Qty: 34 | Fill #2

## 2018-09-28 MED FILL — SPIRONOLACTONE 25 MG TABLET: 25 | 30 days supply | Qty: 30 | Fill #1

## 2018-09-28 MED FILL — POTASSIUM CHLORIDE CRYS ER: 20 | 30 days supply | Qty: 30 | Fill #3

## 2018-09-28 MED FILL — DIGOXIN 0.125 MG TABLET: 125 | 30 days supply | Qty: 30 | Fill #3

## 2018-09-30 ENCOUNTER — Telehealth (HOSPITAL_COMMUNITY): Payer: Self-pay | Admitting: Pharmacy Technician

## 2018-09-30 NOTE — Telephone Encounter (Signed)
It is time to renew application for [employer] through [employer]. I spoke with the patient who now has Medicaid. Ran a test claim for Entresto and it required a PA.  Sent PA through [employer].  Key: [id number]  Status is pending.  Will follow up.  Charlann Boxer, CPhT

## 2018-10-03 NOTE — Telephone Encounter (Signed)
Advanced Heart Failure Patient Advocate Encounter  Prior Authorization for Entresto 24-26mg  has been approved.    PA# [id number] Effective dates: 09/30/2018 through 09/25/2019  Patients co-pay is $3.00  Ran test claim to confirm that the claim would properly go through, came back with NDC not covered. Called [employer] and the representative stated that the patient has family planning only Medicaid. If he feels like he should not have family planning medicaid he would need to contact his caseworker.  Called and gave patient the information that was given to me by [employer]. He claims that he should have regular Medicaid. I explained to him until his caseworker fixes that in their system that the claim would not go through. Patient is going to call his caseworker.  Will continue to follow up.  Charlann Boxer, CPhT

## 2018-10-07 MED FILL — CARVEDILOL 3.125 MG TABLET: 3.125 | 30 days supply | Qty: 60 | Fill #1

## 2018-10-07 NOTE — Telephone Encounter (Addendum)
Spoke with patient, he has been in touch with social services who is supposed to be getting in touch with his worker. He has to give them some paperwork that he intends to take next week.   I was able to provide two bottles of Entresto for him in the interim, which will be at the check in desk. This way he will have plenty of time to get that switched over to regular medicaid.   Lot: [id number]  Exp: 07/2020  Charlann Boxer, CPhT

## 2018-10-17 ENCOUNTER — Other Ambulatory Visit (HOSPITAL_COMMUNITY): Payer: Self-pay | Admitting: Cardiology

## 2018-10-17 DIAGNOSIS — I5022 Chronic systolic (congestive) heart failure: Secondary | ICD-10-CM

## 2018-10-18 ENCOUNTER — Other Ambulatory Visit (HOSPITAL_COMMUNITY): Payer: Self-pay

## 2018-10-18 DIAGNOSIS — I5022 Chronic systolic (congestive) heart failure: Secondary | ICD-10-CM

## 2018-10-18 MED ORDER — FUROSEMIDE 40 MG PO TABS: 40.0000 mg | ORAL_TABLET | Freq: Every day | ORAL | 0 refills | Status: DC

## 2018-10-28 MED FILL — SPIRONOLACTONE 25 MG TABS: 25 | 30 days supply | Qty: 30 | Fill #2

## 2018-10-28 MED FILL — CARVEDILOL 3.125 MG TABLET: 3.125 | 30 days supply | Qty: 60 | Fill #0

## 2018-10-28 MED FILL — DIGOXIN 0.125 MG TABLET: 125 | 30 days supply | Qty: 30 | Fill #4

## 2018-10-28 MED FILL — POTASSIUM CHLORIDE CRYS ER: 20 | 30 days supply | Qty: 30 | Fill #4

## 2018-11-02 NOTE — Telephone Encounter (Signed)
Called and spoke with patient. His medicaid worker has yet to get in contact with him after he has provided her the requested documents. I advised him to call social services and ask for her supervisor. Also advised him to see when he does speak with her if she would provide him with her email, as I have seen better outcomes with that. He will be out of Entresto next week. Will provide him another month of samples to help get him through this.  He is going to call me with updates.  Charlann Boxer, CPhT

## 2018-11-03 ENCOUNTER — Other Ambulatory Visit (HOSPITAL_COMMUNITY): Payer: Self-pay | Admitting: Cardiology

## 2018-11-03 DIAGNOSIS — I5022 Chronic systolic (congestive) heart failure: Secondary | ICD-10-CM

## 2018-11-04 ENCOUNTER — Other Ambulatory Visit (HOSPITAL_COMMUNITY): Payer: Self-pay

## 2018-11-04 DIAGNOSIS — I5022 Chronic systolic (congestive) heart failure: Secondary | ICD-10-CM

## 2018-11-04 MED ORDER — ATORVASTATIN CALCIUM 80 MG PO TABS: 80.0000 mg | ORAL_TABLET | Freq: Every day | ORAL | 2 refills | Status: DC

## 2018-11-04 MED FILL — ATORVASTATIN 80 MG TABLET: 80 | 34 days supply | Qty: 34 | Fill #0

## 2018-11-04 NOTE — Telephone Encounter (Signed)
Sample (2 Bottles)  LOT: [id number]  Exp: 03/2020  Took sample bottles to check in desk.  Charlann Boxer, CPhT

## 2018-11-10 ENCOUNTER — Ambulatory Visit (INDEPENDENT_AMBULATORY_CARE_PROVIDER_SITE_OTHER): Payer: Self-pay | Admitting: *Deleted

## 2018-11-10 DIAGNOSIS — I5022 Chronic systolic (congestive) heart failure: Secondary | ICD-10-CM

## 2018-11-10 DIAGNOSIS — I255 Ischemic cardiomyopathy: Secondary | ICD-10-CM

## 2018-11-10 LAB — CUP PACEART REMOTE DEVICE CHECK
Battery Remaining Longevity: 133 mo
Battery Voltage: 3.04 V
Brady Statistic RV Percent Paced: 0 %
Date Time Interrogation Session: 20201022071704
HighPow Impedance: 75 Ohm
Implantable Lead Implant Date: 20191031
Implantable Lead Location: 753860
Implantable Lead Model: 6935
Implantable Pulse Generator Implant Date: 20191031
Lead Channel Impedance Value: 361 Ohm
Lead Channel Impedance Value: 475 Ohm
Lead Channel Pacing Threshold Amplitude: 0.75 V
Lead Channel Pacing Threshold Pulse Width: 0.4 ms
Lead Channel Sensing Intrinsic Amplitude: 12.25 mV
Lead Channel Setting Pacing Amplitude: 2 V
Lead Channel Setting Pacing Pulse Width: 0.4 ms
Lead Channel Setting Sensing Sensitivity: 0.3 mV

## 2018-11-21 NOTE — Progress Notes (Signed)
Remote ICD transmission.   

## 2018-11-22 ENCOUNTER — Other Ambulatory Visit (HOSPITAL_COMMUNITY): Payer: Self-pay | Admitting: Cardiology

## 2018-11-22 DIAGNOSIS — I5022 Chronic systolic (congestive) heart failure: Secondary | ICD-10-CM

## 2018-11-22 MED FILL — FUROSEMIDE 40 MG TAB: 40 | 34 days supply | Qty: 34 | Fill #0

## 2018-11-22 MED FILL — CARVEDILOL 3.125 MG TABLET: 3.125 | 30 days supply | Qty: 60 | Fill #1

## 2018-11-22 MED FILL — SPIRONOLACTONE 25 MG TABS: 25 | 30 days supply | Qty: 30 | Fill #3

## 2018-11-22 MED FILL — DIGOXIN 0.125 MG TABLET: 125 | 30 days supply | Qty: 30 | Fill #5

## 2018-11-22 MED FILL — POTASSIUM CHLORIDE CRYS ER: 20 | 30 days supply | Qty: 30 | Fill #5

## 2018-11-28 ENCOUNTER — Telehealth (HOSPITAL_COMMUNITY): Payer: Self-pay | Admitting: Licensed Clinical Social Worker

## 2018-11-28 NOTE — Telephone Encounter (Signed)
CSW reaching out to patient because they are currently receiving some medications through [place].  CSW called to discuss if pt is eligible for any alternative coverage options.   Patient currently with [facility] which does not cover medications but patient states he believes he is supposed to have Adult Medicaid now because he received disability and has been trying to get a hold of his Medicaid case worker to discuss. Patient states he is receiving $600/month through disability which means he should qualify for adult medicaid as he would have a qualifying disability and be under the income requirement- pt states this is his only source of income.  CSW called [employer] to speak with Insurance account manager.  Informed current Family Planning Medicaid worker is Marlana Salvage: [phone]- CSW left message with that case worker. Informed that worker who completed Medicaid application back in June was [place] emailed case worker to inquire about eligibility for Adult Medicaid.  CSW will continue to follow to assist as needed.  Jorge Ny, LCSW Clinical Social Worker Advanced [facility] Failure Clinic Desk#: [phone] Cell#: [phone]

## 2018-11-29 ENCOUNTER — Telehealth (HOSPITAL_COMMUNITY): Payer: Self-pay | Admitting: Licensed Clinical Social Worker

## 2018-11-29 NOTE — Telephone Encounter (Signed)
CSW received call back from pt Family Planning Medicaid worker- worker states that patient would have to reapply for Adult Medicaid.  CSW called pt to inform and pt wife informed CSW that they had spoken to a different case worker who is doing a review of the patient for a different kind of medicaid (Children and Family)- case worker is Nelva Bush [phone]- they report they had to send her disability information so they are hopeful she is reviewing him for Adult Medicaid benefits.  CSW called and left message with Ms. Glennon Mac to discuss.  CSW will continue to follow and assist as needed  Joshua May, [facility] Desk#: [phone] Cell#: [phone]

## 2018-12-09 ENCOUNTER — Ambulatory Visit (INDEPENDENT_AMBULATORY_CARE_PROVIDER_SITE_OTHER): Payer: Self-pay | Admitting: *Deleted

## 2018-12-09 ENCOUNTER — Other Ambulatory Visit: Payer: Self-pay

## 2018-12-09 DIAGNOSIS — Z5181 Encounter for therapeutic drug level monitoring: Secondary | ICD-10-CM

## 2018-12-09 LAB — POCT INR: INR: 1.4 — AB (ref 2.0–3.0)

## 2018-12-09 MED FILL — ATORVASTATIN 80 MG TABLET: 80 | 34 days supply | Qty: 34 | Fill #1

## 2018-12-09 NOTE — Patient Instructions (Signed)
Description    Take 1 tablet today and 1.5 tablets tomorrow, then continue taking 1 tablet daily except 1/2 tablet on Mondays, Wednesdays, and Fridays.  Recheck INR in 2 weeks. Call with any new medications or procedures [facility]in Clinic [phone] Main [phone]

## 2018-12-12 ENCOUNTER — Telehealth (HOSPITAL_COMMUNITY): Payer: Self-pay | Admitting: *Deleted

## 2018-12-12 NOTE — Telephone Encounter (Signed)
Pt came into office to request samples of Entresto, he states he ran out over the weekend.  He only has Family Planning Medicaid so it does not cover prescriptions, he is in the process of getting it changed to Adult Medicaid but was told it may take a while.  Samples provided, completed [employer] pt assist for him as he currently does not have prescription coverage, will have Dr Aundra Dubin sign and will fax.  Medication Samples have been provided to the patient.  Drug name: Delene Loll       Strength: 24/26        Qty: 3  LOT: [id number]  Exp.Date: 03/2020  Dosing instructions: Take 1 tab Twice daily   The patient has been instructed regarding the correct time, dose, and frequency of taking this medication, including desired effects and most common side effects.   Yaqueline Gutter 12:35 PM 12/12/2018

## 2018-12-12 NOTE — Telephone Encounter (Signed)
Patient Advocate Encounter  Completed application for [employer] Patient Assistance Program sent in an effort to reduce the patient's out of pocket expense for Entresto to $0.    Application completed and faxed to [phone].   [employer] patient assistance phone number for follow up is [phone].   This encounter will be updated until final determination.  (Patient has not been able to get his family planning medicaid switched to regular medicaid. We are hoping to get [employer] help in the meantime.  Charlann Boxer, CPhT

## 2018-12-16 MED FILL — CARVEDILOL 3.125 MG TABLET: 3.125 | 30 days supply | Qty: 60 | Fill #2

## 2018-12-22 ENCOUNTER — Telehealth (HOSPITAL_COMMUNITY): Payer: Self-pay | Admitting: Pharmacist

## 2018-12-22 NOTE — Telephone Encounter (Signed)
Received notice did not receive patient assistance application for Entresto even though successful fax transmission notice was received. Re-faxed in patient application today.    Vannie Hochstetler, PharmD, BCPS, BCCP, CPP Heart Failure Clinic Pharmacist [phone]  

## 2018-12-23 ENCOUNTER — Ambulatory Visit (INDEPENDENT_AMBULATORY_CARE_PROVIDER_SITE_OTHER): Payer: Self-pay | Admitting: *Deleted

## 2018-12-23 ENCOUNTER — Other Ambulatory Visit: Payer: Self-pay

## 2018-12-23 DIAGNOSIS — D6862 Lupus anticoagulant syndrome: Secondary | ICD-10-CM

## 2018-12-23 DIAGNOSIS — I340 Nonrheumatic mitral (valve) insufficiency: Secondary | ICD-10-CM

## 2018-12-23 DIAGNOSIS — Z5181 Encounter for therapeutic drug level monitoring: Secondary | ICD-10-CM

## 2018-12-23 DIAGNOSIS — Z9889 Other specified postprocedural states: Secondary | ICD-10-CM

## 2018-12-23 LAB — POCT INR: INR: 2.3 (ref 2.0–3.0)

## 2018-12-23 NOTE — Patient Instructions (Signed)
Description   Continue taking 1 tablet daily except 1/2 tablet on Mondays, Wednesdays, and Fridays.  Recheck INR in 6 weeks. Call with any new medications or procedures Coumadin Clinic [phone] Main [phone]

## 2018-12-27 ENCOUNTER — Other Ambulatory Visit (HOSPITAL_COMMUNITY): Payer: Self-pay | Admitting: Cardiology

## 2018-12-27 DIAGNOSIS — I5022 Chronic systolic (congestive) heart failure: Secondary | ICD-10-CM

## 2018-12-27 MED FILL — POTASSIUM CHLORIDE CRYS ER: 20 | 30 days supply | Qty: 30 | Fill #6

## 2018-12-27 MED FILL — FUROSEMIDE 40 MG TAB: 40 | 34 days supply | Qty: 34 | Fill #0

## 2018-12-27 MED FILL — DIGOXIN 0.125 MG TABLET: 125 | 30 days supply | Qty: 30 | Fill #6

## 2018-12-27 MED FILL — SPIRONOLACTONE 25 MG TABS: 25 | 30 days supply | Qty: 30 | Fill #4

## 2018-12-29 NOTE — Telephone Encounter (Signed)
Had to resend [employer] application despite confirmation. Called and checked status of application today. Advised me to call back in 2-5 days.  Charlann Boxer, CPhT

## 2019-01-06 MED FILL — CARVEDILOL 3.125 MG TABLET: 3.125 | 30 days supply | Qty: 60 | Fill #3

## 2019-01-09 NOTE — Telephone Encounter (Signed)
Called [employer] to check the status of patient's application. Patient needs to send POI or call the foundation so they can send him an application for him to check the [place] portion so they can check his income and send it back to them.  Called and left patient message to call me back.  Charlann Boxer, CPhT

## 2019-01-10 ENCOUNTER — Other Ambulatory Visit: Payer: Self-pay | Admitting: Cardiology

## 2019-01-10 MED ORDER — WARFARIN SODIUM 7.5 MG PO TABS: ORAL_TABLET | ORAL | 0 refills | Status: DC

## 2019-01-10 MED FILL — ATORVASTATIN 80 MG TABLET: 80 | 34 days supply | Qty: 34 | Fill #2

## 2019-01-10 NOTE — Telephone Encounter (Signed)
*  STAT* If patient is at the pharmacy, call can be transferred to refill team.   1. Which medications need to be refilled? (please list name of each medication and dose if known)  warfarin (COUMADIN) 7.5 MG tablet  2. Which pharmacy/location (including street and city if local pharmacy) is medication to be sent to? [facility] ([place]), [place] - [address]  3. Do they need a 30 day or 90 day supply? 90  Pt is out of medication. He is scheduled for his next appt 02-03-19

## 2019-01-10 NOTE — Telephone Encounter (Signed)
Refill sent in

## 2019-01-27 ENCOUNTER — Telehealth (HOSPITAL_COMMUNITY): Payer: Self-pay | Admitting: Cardiology

## 2019-01-27 ENCOUNTER — Other Ambulatory Visit (HOSPITAL_COMMUNITY): Payer: Self-pay | Admitting: Cardiology

## 2019-01-27 DIAGNOSIS — I5022 Chronic systolic (congestive) heart failure: Secondary | ICD-10-CM

## 2019-01-27 MED ORDER — DIGOXIN 125 MCG PO TABS: 125.0000 ug | ORAL_TABLET | Freq: Every day | ORAL | 3 refills | Status: DC

## 2019-01-27 MED ORDER — FUROSEMIDE 40 MG PO TABS: 40.0000 mg | ORAL_TABLET | Freq: Every day | ORAL | 3 refills | Status: DC

## 2019-01-27 MED ORDER — POTASSIUM CHLORIDE CRYS ER 20 MEQ PO TBCR: 20.0000 meq | EXTENDED_RELEASE_TABLET | Freq: Every day | ORAL | 3 refills | Status: DC

## 2019-01-27 MED FILL — SPIRONOLACTONE 25 MG TABS: 25 | 30 days supply | Qty: 30 | Fill #5

## 2019-01-27 MED FILL — DIGOXIN 0.125 MG TABLET: 125 | 30 days supply | Qty: 30 | Fill #0

## 2019-01-27 MED FILL — FUROSEMIDE 40 MG TAB: 40 | 34 days supply | Qty: 34 | Fill #0

## 2019-01-27 MED FILL — POTASSIUM CHLORIDE CRYS ER: 20 | 30 days supply | Qty: 30 | Fill #0

## 2019-01-27 NOTE — Telephone Encounter (Signed)
Medication Samples have been provided to the patient.  Drug name: entresto       Strength: 24/26        Qty: 28  LOT: [id number]  Exp.Date: 07/2020  Dosing instructions: ONE TAB  BY MOUTH TWICE DAILY  The patient has been instructed regarding the correct time, dose, and frequency of taking this medication, including desired effects and most common side effects.   Magda Bernheim M 3:25 PM 01/27/2019    SAMPLE MEDICATIONS LEFT IN THE FRONT OFFICE FOR PATIENT PICK UP

## 2019-01-31 MED FILL — CARVEDILOL 3.125 MG TABLET: 3.125 | 30 days supply | Qty: 60 | Fill #4

## 2019-02-03 ENCOUNTER — Ambulatory Visit (INDEPENDENT_AMBULATORY_CARE_PROVIDER_SITE_OTHER): Payer: Self-pay | Admitting: *Deleted

## 2019-02-03 ENCOUNTER — Other Ambulatory Visit: Payer: Self-pay

## 2019-02-03 DIAGNOSIS — I340 Nonrheumatic mitral (valve) insufficiency: Secondary | ICD-10-CM

## 2019-02-03 DIAGNOSIS — Z9889 Other specified postprocedural states: Secondary | ICD-10-CM

## 2019-02-03 DIAGNOSIS — Z5181 Encounter for therapeutic drug level monitoring: Secondary | ICD-10-CM

## 2019-02-03 DIAGNOSIS — D6862 Lupus anticoagulant syndrome: Secondary | ICD-10-CM

## 2019-02-03 LAB — POCT INR: INR: 1.7 — AB (ref 2.0–3.0)

## 2019-02-03 NOTE — Patient Instructions (Signed)
Description   Today take 1 tablet then continue taking 1 tablet daily except 1/2 tablet on Mondays, Wednesdays, and Fridays.  Recheck INR in 4 weeks (usually 6 weeks). Call with any new medications or procedures [facility]in Clinic [phone] Main [phone]

## 2019-02-09 ENCOUNTER — Ambulatory Visit (INDEPENDENT_AMBULATORY_CARE_PROVIDER_SITE_OTHER): Payer: Self-pay | Admitting: *Deleted

## 2019-02-09 DIAGNOSIS — I255 Ischemic cardiomyopathy: Secondary | ICD-10-CM

## 2019-02-09 LAB — CUP PACEART REMOTE DEVICE CHECK
Battery Remaining Longevity: 131 mo
Battery Voltage: 3.04 V
Brady Statistic RV Percent Paced: 0 %
Date Time Interrogation Session: 20210121001604
HighPow Impedance: 73 Ohm
Implantable Lead Implant Date: 20191031
Implantable Lead Location: 753860
Implantable Lead Model: 6935
Implantable Pulse Generator Implant Date: 20191031
Lead Channel Impedance Value: 399 Ohm
Lead Channel Impedance Value: 456 Ohm
Lead Channel Pacing Threshold Amplitude: 0.625 V
Lead Channel Pacing Threshold Pulse Width: 0.4 ms
Lead Channel Sensing Intrinsic Amplitude: 12.25 mV
Lead Channel Sensing Intrinsic Amplitude: 12.25 mV
Lead Channel Setting Pacing Amplitude: 2 V
Lead Channel Setting Pacing Pulse Width: 0.4 ms
Lead Channel Setting Sensing Sensitivity: 0.3 mV

## 2019-02-14 ENCOUNTER — Other Ambulatory Visit (HOSPITAL_COMMUNITY): Payer: Self-pay | Admitting: Cardiology

## 2019-02-14 ENCOUNTER — Other Ambulatory Visit (HOSPITAL_COMMUNITY): Payer: Self-pay | Admitting: *Deleted

## 2019-02-14 DIAGNOSIS — I5022 Chronic systolic (congestive) heart failure: Secondary | ICD-10-CM

## 2019-02-14 MED ORDER — CARVEDILOL 3.125 MG PO TABS: 6.2500 mg | ORAL_TABLET | Freq: Two times a day (BID) | ORAL | 3 refills | Status: DC

## 2019-02-14 MED FILL — ATORVASTATIN 80 MG TABLET: 80 | 30 days supply | Qty: 30 | Fill #0

## 2019-02-14 MED FILL — CARVEDILOL 3.125 MG TABLET: 3.125 | 30 days supply | Qty: 120 | Fill #0

## 2019-02-23 NOTE — Telephone Encounter (Signed)
Will send [employer] application with FCRA checked again. Representative claims despite confirmation it was not received.  Archer Asa, CPhT

## 2019-02-27 ENCOUNTER — Other Ambulatory Visit (HOSPITAL_COMMUNITY): Payer: Self-pay

## 2019-02-27 ENCOUNTER — Other Ambulatory Visit (HOSPITAL_COMMUNITY): Payer: Self-pay | Admitting: Cardiology

## 2019-02-27 DIAGNOSIS — I5022 Chronic systolic (congestive) heart failure: Secondary | ICD-10-CM

## 2019-02-27 MED ORDER — FUROSEMIDE 40 MG PO TABS: 40.0000 mg | ORAL_TABLET | Freq: Every day | ORAL | 3 refills | Status: DC

## 2019-02-27 MED FILL — DIGOXIN 0.125 MG TABLET: 125 | 30 days supply | Qty: 30 | Fill #1

## 2019-02-27 MED FILL — SPIRONOLACTONE 25 MG TABS: 25 | 30 days supply | Qty: 30 | Fill #6

## 2019-02-27 MED FILL — POTASSIUM CHLORIDE CRYS ER: 20 | 30 days supply | Qty: 30 | Fill #1

## 2019-02-27 MED FILL — FUROSEMIDE 40 MG TAB: 40 | 34 days supply | Qty: 34 | Fill #0

## 2019-03-03 ENCOUNTER — Other Ambulatory Visit: Payer: Self-pay

## 2019-03-03 ENCOUNTER — Ambulatory Visit (INDEPENDENT_AMBULATORY_CARE_PROVIDER_SITE_OTHER): Payer: Self-pay

## 2019-03-03 DIAGNOSIS — Z9889 Other specified postprocedural states: Secondary | ICD-10-CM

## 2019-03-03 DIAGNOSIS — D6862 Lupus anticoagulant syndrome: Secondary | ICD-10-CM

## 2019-03-03 DIAGNOSIS — Z5181 Encounter for therapeutic drug level monitoring: Secondary | ICD-10-CM

## 2019-03-03 DIAGNOSIS — I340 Nonrheumatic mitral (valve) insufficiency: Secondary | ICD-10-CM

## 2019-03-03 LAB — POCT INR: INR: 2.1 (ref 2.0–3.0)

## 2019-03-03 NOTE — Telephone Encounter (Signed)
Called and spoke with [employer], they received the patient portion as of 2/10. Still in review.  Archer Asa, CPhT

## 2019-03-03 NOTE — Patient Instructions (Signed)
Description   Continue taking 1 tablet daily except 1/2 tablet on Mondays, Wednesdays, and Fridays.  Recheck INR in 4 weeks (usually 6 weeks). Call with any new medications or procedures [facility]in Clinic [phone] Main [phone]

## 2019-03-08 NOTE — Telephone Encounter (Signed)
Patient's application is still in review. If need be, patient can call and request a gradis fill during this time. I requested that they send the patient a refill. Representative stated he would send a message over to the pharmacy for the refill. Right now, the pharmacy is not mailing until the week of 2/22 due to the weather and power outages in their area.   Archer Asa, CPhT

## 2019-03-21 MED FILL — ATORVASTATIN 80 MG TABLET: 80 | 30 days supply | Qty: 30 | Fill #1

## 2019-03-24 ENCOUNTER — Telehealth (HOSPITAL_COMMUNITY): Payer: Self-pay | Admitting: Pharmacist

## 2019-03-24 NOTE — Telephone Encounter (Signed)
Advanced Heart Failure Patient Advocate Encounter   Patient was approved to receive Entresto from [employer].  Patient ID: [id number] Effective dates: 03/24/19 through 03/23/20  Karle Plumber, PharmD, BCPS, BCCP, CPP Heart Failure Clinic Pharmacist [phone]

## 2019-03-27 MED FILL — CARVEDILOL 3.125 MG TABLET: 3.125 | 30 days supply | Qty: 120 | Fill #1

## 2019-03-27 MED FILL — DIGOXIN 0.125 MG TABLET: 125 | 30 days supply | Qty: 30 | Fill #2

## 2019-03-27 MED FILL — SPIRONOLACTONE 25 MG TABS: 25 | 30 days supply | Qty: 30 | Fill #7

## 2019-03-27 MED FILL — POTASSIUM CHLORIDE CRYS ER: 20 | 30 days supply | Qty: 30 | Fill #2

## 2019-03-31 ENCOUNTER — Other Ambulatory Visit: Payer: Self-pay

## 2019-03-31 ENCOUNTER — Ambulatory Visit (INDEPENDENT_AMBULATORY_CARE_PROVIDER_SITE_OTHER): Payer: Self-pay

## 2019-03-31 DIAGNOSIS — D6862 Lupus anticoagulant syndrome: Secondary | ICD-10-CM

## 2019-03-31 DIAGNOSIS — I340 Nonrheumatic mitral (valve) insufficiency: Secondary | ICD-10-CM

## 2019-03-31 DIAGNOSIS — Z5181 Encounter for therapeutic drug level monitoring: Secondary | ICD-10-CM

## 2019-03-31 DIAGNOSIS — Z9889 Other specified postprocedural states: Secondary | ICD-10-CM

## 2019-03-31 LAB — POCT INR: INR: 1.9 — AB (ref 2.0–3.0)

## 2019-03-31 NOTE — Patient Instructions (Signed)
Description   Take 1 tablet today, then resume same dosage 1 tablet daily except 1/2 tablet on Mondays, Wednesdays, and Fridays.  Recheck INR in 4 weeks. Call with any new medications or procedures Cou[facility]in Clinic [phone] Main [phone]

## 2019-04-04 MED FILL — FUROSEMIDE 40 MG TAB: 40 | 34 days supply | Qty: 34 | Fill #1

## 2019-04-21 MED FILL — ATORVASTATIN 80 MG TABLET: 80 | 30 days supply | Qty: 30 | Fill #2

## 2019-04-24 MED FILL — CARVEDILOL 3.125 MG TABLET: 3.125 | 30 days supply | Qty: 120 | Fill #2

## 2019-04-24 MED FILL — POTASSIUM CHLORIDE CRYS ER: 20 | 30 days supply | Qty: 30 | Fill #3

## 2019-04-24 MED FILL — DIGOXIN 0.125 MG TABLET: 125 | 30 days supply | Qty: 30 | Fill #3

## 2019-04-25 ENCOUNTER — Other Ambulatory Visit (HOSPITAL_COMMUNITY): Payer: Self-pay

## 2019-04-25 MED ORDER — SPIRONOLACTONE 25 MG PO TABS: ORAL_TABLET | ORAL | 2 refills | Status: DC

## 2019-04-25 MED FILL — SPIRONOLACTONE 25 MG TABS: 25 | 30 days supply | Qty: 30 | Fill #0

## 2019-04-28 ENCOUNTER — Ambulatory Visit (INDEPENDENT_AMBULATORY_CARE_PROVIDER_SITE_OTHER): Payer: Medicaid Other | Admitting: *Deleted

## 2019-04-28 ENCOUNTER — Other Ambulatory Visit: Payer: Self-pay

## 2019-04-28 DIAGNOSIS — I340 Nonrheumatic mitral (valve) insufficiency: Secondary | ICD-10-CM

## 2019-04-28 DIAGNOSIS — Z5181 Encounter for therapeutic drug level monitoring: Secondary | ICD-10-CM | POA: Diagnosis not present

## 2019-04-28 DIAGNOSIS — Z9889 Other specified postprocedural states: Secondary | ICD-10-CM | POA: Diagnosis not present

## 2019-04-28 DIAGNOSIS — D6862 Lupus anticoagulant syndrome: Secondary | ICD-10-CM | POA: Diagnosis not present

## 2019-04-28 LAB — POCT INR: INR: 2.1 (ref 2.0–3.0)

## 2019-04-28 NOTE — Patient Instructions (Signed)
Description   Continue taking 1 tablet daily except 1/2 tablet on Mondays, Wednesdays, and Fridays.  Recheck INR in 5 weeks. Call with any new medications or procedures [facility]in Clinic [phone] Main [phone]

## 2019-05-11 ENCOUNTER — Ambulatory Visit (INDEPENDENT_AMBULATORY_CARE_PROVIDER_SITE_OTHER): Payer: Medicaid Other | Admitting: *Deleted

## 2019-05-11 ENCOUNTER — Other Ambulatory Visit (HOSPITAL_COMMUNITY): Payer: Self-pay | Admitting: Cardiology

## 2019-05-11 DIAGNOSIS — I255 Ischemic cardiomyopathy: Secondary | ICD-10-CM | POA: Diagnosis not present

## 2019-05-11 DIAGNOSIS — I5022 Chronic systolic (congestive) heart failure: Secondary | ICD-10-CM

## 2019-05-11 LAB — CUP PACEART REMOTE DEVICE CHECK
Battery Remaining Longevity: 129 mo
Battery Voltage: 3.02 V
Brady Statistic RV Percent Paced: 0 %
Date Time Interrogation Session: 20210422063626
HighPow Impedance: 75 Ohm
Implantable Lead Implant Date: 20191031
Implantable Lead Location: 753860
Implantable Lead Model: 6935
Implantable Pulse Generator Implant Date: 20191031
Lead Channel Impedance Value: 361 Ohm
Lead Channel Impedance Value: 456 Ohm
Lead Channel Pacing Threshold Amplitude: 0.5 V
Lead Channel Pacing Threshold Pulse Width: 0.4 ms
Lead Channel Sensing Intrinsic Amplitude: 11.125 mV
Lead Channel Sensing Intrinsic Amplitude: 11.125 mV
Lead Channel Setting Pacing Amplitude: 2 V
Lead Channel Setting Pacing Pulse Width: 0.4 ms
Lead Channel Setting Sensing Sensitivity: 0.3 mV

## 2019-05-12 NOTE — Progress Notes (Signed)
ICD Remote  

## 2019-05-15 MED FILL — FUROSEMIDE 40 MG TAB: 40 | 34 days supply | Qty: 34 | Fill #2

## 2019-05-16 ENCOUNTER — Other Ambulatory Visit (HOSPITAL_COMMUNITY): Payer: Self-pay

## 2019-05-16 DIAGNOSIS — I5022 Chronic systolic (congestive) heart failure: Secondary | ICD-10-CM

## 2019-05-16 MED ORDER — ATORVASTATIN CALCIUM 80 MG PO TABS: 80.0000 mg | ORAL_TABLET | Freq: Every day | ORAL | 2 refills | Status: DC

## 2019-05-16 MED FILL — ATORVASTATIN 80 MG TABLET: 80 | 30 days supply | Qty: 30 | Fill #0

## 2019-05-23 ENCOUNTER — Other Ambulatory Visit: Payer: Self-pay

## 2019-05-23 ENCOUNTER — Ambulatory Visit (HOSPITAL_COMMUNITY)
Admission: RE | Admit: 2019-05-23 | Discharge: 2019-05-23 | Disposition: A | Discharge status: Home / Self Care | Payer: Medicaid Other | Source: Ambulatory Visit | Attending: Adult Health | Admitting: Adult Health

## 2019-05-23 ENCOUNTER — Telehealth (HOSPITAL_COMMUNITY): Payer: Self-pay | Admitting: Pharmacist

## 2019-05-23 ENCOUNTER — Encounter (HOSPITAL_COMMUNITY): Payer: Self-pay

## 2019-05-23 VITALS — BP 102/62 | HR 83 | Wt 290.9 lb

## 2019-05-23 DIAGNOSIS — E785 Hyperlipidemia, unspecified: Secondary | ICD-10-CM | POA: Insufficient documentation

## 2019-05-23 DIAGNOSIS — Z955 Presence of coronary angioplasty implant and graft: Secondary | ICD-10-CM | POA: Diagnosis not present

## 2019-05-23 DIAGNOSIS — Z8249 Family history of ischemic heart disease and other diseases of the circulatory system: Secondary | ICD-10-CM | POA: Insufficient documentation

## 2019-05-23 DIAGNOSIS — I255 Ischemic cardiomyopathy: Secondary | ICD-10-CM

## 2019-05-23 DIAGNOSIS — Z79899 Other long term (current) drug therapy: Secondary | ICD-10-CM | POA: Insufficient documentation

## 2019-05-23 DIAGNOSIS — Z7901 Long term (current) use of anticoagulants: Secondary | ICD-10-CM | POA: Insufficient documentation

## 2019-05-23 DIAGNOSIS — I5022 Chronic systolic (congestive) heart failure: Secondary | ICD-10-CM | POA: Diagnosis not present

## 2019-05-23 DIAGNOSIS — I252 Old myocardial infarction: Secondary | ICD-10-CM | POA: Diagnosis not present

## 2019-05-23 DIAGNOSIS — Z87891 Personal history of nicotine dependence: Secondary | ICD-10-CM | POA: Diagnosis not present

## 2019-05-23 DIAGNOSIS — I2510 Atherosclerotic heart disease of native coronary artery without angina pectoris: Secondary | ICD-10-CM | POA: Diagnosis not present

## 2019-05-23 LAB — BASIC METABOLIC PANEL
Anion gap: 8 (ref 5–15)
BUN: 14 mg/dL (ref 6–20)
CO2: 26 mmol/L (ref 22–32)
Calcium: 8.9 mg/dL (ref 8.9–10.3)
Chloride: 105 mmol/L (ref 98–111)
Creatinine, Ser: 1.08 mg/dL (ref 0.61–1.24)
GFR calc Af Amer: >60 mL/min (ref >60)
GFR calc non Af Amer: >60 mL/min (ref >60)
Glucose, Bld: 142 mg/dL — ABNORMAL HIGH (ref 70–99)
Potassium: 4.8 mmol/L (ref 3.5–5.1)
Sodium: 139 mmol/L (ref 135–145)

## 2019-05-23 LAB — LIPID PANEL
Cholesterol: 134 mg/dL (ref 0–200)
HDL: 24 mg/dL — ABNORMAL LOW (ref >40)
LDL Cholesterol: 34 mg/dL (ref 0–99)
Total CHOL/HDL Ratio: 5.6 RATIO
Triglycerides: 381 mg/dL — ABNORMAL HIGH (ref <150)
VLDL: 76 mg/dL — ABNORMAL HIGH (ref 0–40)

## 2019-05-23 LAB — CBC
HCT: 43.7 % (ref 39.0–52.0)
Hemoglobin: 13.3 g/dL (ref 13.0–17.0)
MCH: 30.6 pg (ref 26.0–34.0)
MCHC: 30.4 g/dL (ref 30.0–36.0)
MCV: 100.5 fL — ABNORMAL HIGH (ref 80.0–100.0)
Platelets: 247 10*3/uL (ref 150–400)
RBC: 4.35 MIL/uL (ref 4.22–5.81)
RDW: 11.9 % (ref 11.5–15.5)
WBC: 8.3 10*3/uL (ref 4.0–10.5)
nRBC: 0 % (ref 0.0–0.2)

## 2019-05-23 MED ORDER — DIGOXIN 125 MCG PO TABS: 125.0000 ug | ORAL_TABLET | Freq: Every day | ORAL | 3 refills | Status: DC

## 2019-05-23 MED ORDER — SACUBITRIL-VALSARTAN 24-26 MG PO TABS: 1.0000 | ORAL_TABLET | Freq: Two times a day (BID) | ORAL | 11 refills | Status: DC

## 2019-05-23 MED ORDER — CARVEDILOL 6.25 MG PO TABS: 6.2500 mg | ORAL_TABLET | Freq: Two times a day (BID) | ORAL | 3 refills | Status: DC

## 2019-05-23 MED ORDER — POTASSIUM CHLORIDE CRYS ER 20 MEQ PO TBCR: 20.0000 meq | EXTENDED_RELEASE_TABLET | Freq: Every day | ORAL | 3 refills | Status: DC

## 2019-05-23 MED ORDER — FUROSEMIDE 40 MG PO TABS: 40.0000 mg | ORAL_TABLET | Freq: Every day | ORAL | 3 refills | Status: DC

## 2019-05-23 MED ORDER — SPIRONOLACTONE 25 MG PO TABS: ORAL_TABLET | ORAL | 3 refills | Status: DC

## 2019-05-23 MED ORDER — ATORVASTATIN CALCIUM 80 MG PO TABS: 80.0000 mg | ORAL_TABLET | Freq: Every day | ORAL | 3 refills | Status: DC

## 2019-05-23 MED ORDER — FARXIGA 10 MG PO TABS: 10.0000 mg | ORAL_TABLET | Freq: Every day | ORAL | 11 refills | Status: DC

## 2019-05-23 MED FILL — ENTRESTO 24 MG-26 MG TABLET: 24-26 | 30 days supply | Qty: 60 | Fill #0

## 2019-05-23 MED FILL — SPIRONOLACTONE 25 MG TABS: 25 | 30 days supply | Qty: 30 | Fill #0

## 2019-05-23 MED FILL — DIGOXIN 0.125 MG TABLET: 125 | 30 days supply | Qty: 30 | Fill #4

## 2019-05-23 MED FILL — POTASSIUM CHLORIDE CRYS ER: 20 | 30 days supply | Qty: 30 | Fill #4

## 2019-05-23 NOTE — Patient Instructions (Addendum)
START Farxiga 10 mg, one tab daily INCREASE Lasix to 80 mg daily for two days, then resume 40 mg daily thereafter   Labs today We will only contact you if something comes back abnormal or we need to make some changes. Otherwise no news is good news!  Your physician has requested that you have an echocardiogram. Echocardiography is a painless test that uses sound waves to create images of your heart. It provides your doctor with information about the size and shape of your heart and how well your heart's chambers and valves are working. This procedure takes approximately one hour. There are no restrictions for this procedure.   Your physician recommends that you schedule a follow-up appointment in: 3 months with Dr Shirlee Latch  Do the following things EVERYDAY: 1) Weigh yourself in the morning before breakfast. Write it down and keep it in a log. 2) Take your medicines as prescribed 3) Eat low salt foods--Limit salt (sodium) to 2000 mg per day.  4) Stay as active as you can everyday 5) Limit all fluids for the day to less than 2 liters  At the [facility], you and your health needs are our priority. As part of our continuing mission to provide you with exceptional heart care, we have created designated Provider Care Teams. These Care Teams include your primary Cardiologist (physician) and Advanced Practice Providers (APPs- Physician Assistants and Nurse Practitioners) who all work together to provide you with the care you need, when you need it.   You may see any of the following providers on your designated Care Team at your next follow up: Marland Kitchen Dr Arvilla Meres . Dr Marca Ancona . Tonye Becket, NP . Robbie Lis, PA . Karle Plumber, PharmD   Please be sure to bring in all your medications bottles to every appointment.

## 2019-05-23 NOTE — Addendum Note (Signed)
Encounter addended by: Marcy Siren, LCSW on: 05/23/2019 11:04 AM  Actions taken: Clinical Note Signed

## 2019-05-23 NOTE — Progress Notes (Signed)
PCP: None  HF Cardiology: Dr Shirlee Latch  CT Surgery: Dr Cornelius Moras  HPI: Joshua May is a 44 y.o. [place] male with a history of CAD dating back to 2007 when he had a PCI in [place]. In 2010 he presented with a STEMI and had an RCA DES placed. He followed up for a year but then didn't present again till Oct 2017 when he presented with a CFX infarct. Cath revealed 50% ISR of the RCA, total mCFX and 85% OM3. He had CFX PCI with DES and OM3 POBA. His EF then was 50-55%.  Admitted 02/27/17 with increased dyspnea and chest pain. CTA confirmed PE. ECHO completed and showed reduced EF 20-25%. Underwent LHC as noted below. [place] no evidence DVTs. CT surgery consulted for severe MR. Plan to optimize HF medications.Set up TEE as an outpatient. Discharge weight 222 pounds.   Admitted 06/2017 with scheduled MV repair. S/P Minimally invasive mitral valve repair on 6/19. ECHO 07/12/17 post surgery, no MR was noted but EF remained 25-30%. Tachycardic at the time of discharge. Discharge weight 257 pounds.   CPX was submaximal in 8/19, suggestive of marked deconditioning.  RHC in 9/19 showed relatively preserved cardiac index with only mildly elevated filling pressures.  Echo 10/05/18, EF 30-35%.   Today he returns for HF follow up.Overall feeling fine. Complaining of right hip pain.  SOB with exertion. . Denies PND/Orthopnea. No chest pain. NO BRBPR. Appetite ok. No fever or chills. Weight at home 285-290 trending up. Taking all medications. Has not smoked in 3 years.   Medtronic: Acitivit ~3 hours /day NO VT/AF. Impedance trending down.    PMH: 1. CAD: Initial PCI in 2007 in [place], inferior MI.   - Inferior STEMI 2010 with RCA PCI.  - CFx infarct in 2017 with LCx DES and OM3 POBA.  - LHC (2/19): Nonobstructive CAD.  2. 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  - TEE (4/19): LVEF 35-40%, Severe MR with PISA ERO 0.43 cm^2. Suspect infarct related MR with tethering of the posterior leaflet.  - Minimally invasive MV repair in  6/19.   3. Chronic systolic CHF: Ischemic cardiomyopathy.  - Echo (2/19): EF 25-30%, diffuse HK with inferolateral AK, mildly decreased RV systolic function with D-shaped septum, moderate-severe infarct-related MR.  - Echo (6/19): EF 25-30%, apical and inferolateral hypokinesis, MV repair with mean gradient 9 mmHg with no MR, mildly decreased RV systolic function.  - CPX (8/19): VO2 13.2, VE/VCO2 slope 28, RER 0.87.  This study was suggestive of severe deconditioning.  - RHC (9/19): mean RA 9, PA 39/23 mean 24, mean PCWP 17, CI 2.2, PVR 1.35 WU - Echo (9/19): EF 30-35%, diffuse hypokinesis, normal RV, s/p MV repair with mean gradient 6 mmHg and PHT 104 msec => probably no significant stenosis.  4. Hyperlipidemia 5. PE: 2/19.   Review of systems complete and found to be negative unless listed in HPI.    Social History   Socioeconomic History  . Marital status: Married    Spouse name: Not on file  . Number of children: Not on file  . Years of education: Not on file  . Highest education level: Not on file  Occupational History  . Not on file  Tobacco Use  . Smoking status: Former Smoker    Packs/day: 0.50    Years: 17.00    Pack years: 8.50    Types: Cigarettes  . Smokeless tobacco: Never Used  . Tobacco comment: quit since heart attack in 10/2015  Substance and Sexual Activity  . Alcohol use: Not Currently  . Drug use: Not Currently    Types: Marijuana  . Sexual activity: Not on file  Other Topics Concern  . Not on file  Social History Narrative   The patient is married with two children.   Social Determinants of Health   Financial Resource Strain:   . Difficulty of Paying Living Expenses:   Food Insecurity:   . Worried About Charity fundraiser in the Last Year:   . Arboriculturist in the Last Year:   Transportation Needs:   . Film/video editor (Medical):   Marland Kitchen Lack of Transportation (Non-Medical):   Physical Activity:   . Days of Exercise per Week:   . Minutes  of Exercise per Session:   Stress:   . Feeling of Stress :   Social Connections:   . Frequency of Communication with Friends and Family:   . Frequency of Social Gatherings with Friends and Family:   . Attends Religious Services:   . Active Member of Clubs or Organizations:   . Attends Archivist Meetings:   Marland Kitchen Marital Status:   Intimate Partner Violence:   . Fear of Current or Ex-Partner:   . Emotionally Abused:   Marland Kitchen Physically Abused:   . Sexually Abused:    Family History  Problem Relation Age of Onset  . Heart attack Sister 27       S/P CABG  . Heart attack Father   . Deep vein thrombosis Father     Current Outpatient Medications  Medication Sig Dispense Refill  . acetaminophen (TYLENOL) 500 MG tablet Take 1,000 mg by mouth daily as needed for moderate pain or headache.    Marland Kitchen atorvastatin (LIPITOR) 80 MG tablet Take 1 tablet (80 mg total) by mouth daily. Needs appt for further refills 30 tablet 2  . carvedilol (COREG) 3.125 MG tablet Take 2 tablets (6.25 mg total) by mouth 2 (two) times daily with a meal. 120 tablet 3  . digoxin (LANOXIN) 0.125 MG tablet Take 1 tablet (125 mcg total) by mouth daily. 30 tablet 3  . ferrous sulfate 325 (65 FE) MG tablet Take 1 tablet (325 mg total) by mouth daily with breakfast. For one month then stop. 30 tablet 3  . folic acid (FOLVITE) 1 MG tablet Take 1 tablet (1 mg total) by mouth daily. For one month then stop.    . furosemide (LASIX) 40 MG tablet Take 1 tablet (40 mg total) by mouth daily. 34 tablet 3  . potassium chloride SA (KLOR-CON) 20 MEQ tablet Take 1 tablet (20 mEq total) by mouth daily. 30 tablet 3  . sacubitril-valsartan (ENTRESTO) 24-26 MG Take 1 tablet by mouth 2 (two) times daily. 60 tablet 11  . spironolactone (ALDACTONE) 25 MG tablet TAKE 1 TABLET (25 MG TOTAL) BY MOUTH EVERY MORNING 30 tablet 2  . warfarin (COUMADIN) 7.5 MG tablet TAKE 1/2 TO 1 TABLET BY MOUTH  DAILY AS DIRECTED BY COUMADIN CLINIC 90 tablet 0  .  psyllium (METAMUCIL) 58.6 % powder Take 1 packet by mouth daily as needed (constipation).     No current facility-administered medications for this encounter.   Vitals:   05/23/19 0842  BP: 102/62  Pulse: 83  SpO2: 96%  Weight: 131.9 kg (290 lb 13.6 oz)    Wt Readings from Last 3 Encounters:  05/23/19 131.9 kg (290 lb 13.6 oz)  06/23/18 124.6 kg (274 lb 9.6 oz)  04/12/18 124.9  kg (275 lb 6.4 oz)    PHYSICAL EXAM: General:  Well appearing. No resp difficulty HEENT: normal Neck: supple. no JVD. Carotids 2+ bilat; no bruits. No lymphadenopathy or thryomegaly appreciated. Cor: PMI nondisplaced. Regular rate & rhythm. No rubs, gallops or murmurs. Lungs: clear Abdomen: soft, nontender, nondistended. No hepatosplenomegaly. No bruits or masses. Good bowel sounds. Extremities: no cyanosis, clubbing, rash, edema Neuro: alert & orientedx3, cranial nerves grossly intact. moves all 4 extremities w/o difficulty. Affect pleasant  ASSESSMENT & PLAN: 1. Chronic systolic CHF: - Ischemic cardiomyopathy. Echo in 6/19 with EF 25-30%, echo done today with EF 30-35%.    RHC in 9/19 showed only mild fluid overload and cardiac index was relatively preserved at 2.2. CPX showed marked deconditioning.   NYHA III. Reds Clip 40%  - Medtronic ICD. Impedance trending down.   - Mild volume overload. Double lasix 80 mg daily x2 days then back to lasix 40 mg daily.  - Continue digoxin 0.125.   - Continue spironolactone 25 mg daily.   -Continue coreg 3.125 mg twice a day.   - Continue Entresto 24/26 mg BID.  - Add farixga 10 mg daily.  - Check BMET today. Check ECHO next visit.  - Discussed low salt food choices.   2. CAD:  - Stents in LCx and RCA. LHC 02/2017 showed nonobstructive disease.  - No chest pain.  - Continue atorvastatin, check today - He is on warfarin so no ASA 81.  - Check CBC  3. PE:  - In 2019, no definite trigger. Father with h/o VTE.  He should likely have long-term anticoagulation.     - Continue coumadin.  - no bleeding issues.   4. Mitral regurgitation s/p minimally invasive MV repair in 6/19.  - Post-op echo 6/19 showed no MR but mean gradient 9 mmHg across MV. Echo (9/19) with mean MV gradient 6 mmHg but PHT short at 104 msec, probably no significant stenosis.   5. Former smoker:  -Quit 3 years ago.    Check ECHO next visit. Follow up in 3 months with Dr Shirlee Latch.  Check CBC, BMET, lipids today.    Tonye Becket, NP  05/23/2019

## 2019-05-23 NOTE — Telephone Encounter (Signed)
Patient Advocate Encounter   Received notification from [facility] Medicaid that prior authorization for Marcelline Deist is required. Of note, [place] Medicaid prescription insurance is new (previously only had Family Planning Medicaid).    PA submitted on [place] [employer] Confirmation #: [id number] W Recipient ID: [id number] Q Status is pending   Will continue to follow.

## 2019-05-23 NOTE — Progress Notes (Signed)
CSW met with patient and wife in the clinic. Patient and wife shared that patient is now receiving disability and has medicaid. They shared it has been a long journey to finally get some benefits. Patient inquired about returning to the [facility] Group and CSW shared that the groups are still not meeting in person yet due to covid. CSW provided supportive encouragement and will notify patient as soon as group is able to reengage. Patient also inquired about obtaining a PCP. CSW assisted with appointment at [facility] on may 19th @ 3:30. Patient grateful for the support and assistance with obtaining a PCP appointment. CSW continues to follow as needed. Raquel Sarna, [place], [place]

## 2019-05-23 NOTE — Progress Notes (Signed)
ReDS Vest / Clip - 05/23/19 0800      ReDS Vest / Clip   Station Marker  D    Ruler Value  39    ReDS Value Range  Moderate volume overload    ReDS Actual Value  40    Anatomical Comments  sitting

## 2019-05-24 ENCOUNTER — Other Ambulatory Visit: Payer: Self-pay | Admitting: Cardiology

## 2019-05-24 ENCOUNTER — Telehealth: Payer: Self-pay | Admitting: *Deleted

## 2019-05-24 MED ORDER — CARVEDILOL 12.5 MG PO TABS
12.5000 mg | ORAL_TABLET | Freq: Two times a day (BID) | ORAL | 2 refills | Status: DC
Start: 2019-05-24 — End: 2019-10-24

## 2019-05-24 NOTE — Telephone Encounter (Signed)
Regan Lemming, MD  05/15/2019 10:33 AM EDT    Abnormal device interrogation reviewed. Lead parameters and battery status stable. NSVT episode noted. Increase coreg to 12.5 mg BID.

## 2019-05-24 NOTE — Telephone Encounter (Signed)
Pt agreeable to advisement. New Rx sent to pharmacy. Pt instructed to call if issues arise after medication increase. Patient verbalized understanding and agreeable to plan.

## 2019-05-25 ENCOUNTER — Telehealth (HOSPITAL_COMMUNITY): Payer: Self-pay | Admitting: Cardiology

## 2019-05-25 DIAGNOSIS — E785 Hyperlipidemia, unspecified: Secondary | ICD-10-CM

## 2019-05-25 MED FILL — CARVEDILOL 12.5 MG TABLET: 12.5 | 90 days supply | Qty: 180 | Fill #0

## 2019-05-25 MED FILL — FARXIGA 10 MG TABLET: 10 | 30 days supply | Qty: 30 | Fill #0

## 2019-05-25 NOTE — Telephone Encounter (Signed)
-----   Message from Sherald Hess, NP sent at 05/23/2019 12:03 PM EDT ----- Renal function and CBC  stable. No change.  Triglycerides running high on high dose atorvastatin. Refer to lipid clinic

## 2019-05-25 NOTE — Telephone Encounter (Signed)
Advanced Heart Failure Patient Advocate Encounter  Prior Authorization for Marcelline Deist has been approved.    PA# [id number] W Effective dates: 05/23/2019 - 05/17/2020  Patients co-pay is $3.00  Karle Plumber, PharmD, BCPS, BCCP, CPP Heart Failure Clinic Pharmacist [phone]

## 2019-05-25 NOTE — Telephone Encounter (Signed)
[phone] (M) pt aware voiced understanding

## 2019-05-29 ENCOUNTER — Other Ambulatory Visit: Payer: Self-pay | Admitting: Pharmacist

## 2019-05-29 MED ORDER — WARFARIN SODIUM 7.5 MG PO TABS: ORAL_TABLET | ORAL | 0 refills | Status: DC

## 2019-06-01 ENCOUNTER — Encounter: Payer: Self-pay | Admitting: *Deleted

## 2019-06-01 ENCOUNTER — Encounter: Payer: Self-pay | Admitting: Internal Medicine

## 2019-06-01 ENCOUNTER — Ambulatory Visit (INDEPENDENT_AMBULATORY_CARE_PROVIDER_SITE_OTHER): Payer: Medicaid Other | Admitting: Internal Medicine

## 2019-06-01 ENCOUNTER — Other Ambulatory Visit: Payer: Self-pay

## 2019-06-01 VITALS — BP 119/83 | HR 84 | Ht 71.0 in | Wt 288.0 lb

## 2019-06-01 DIAGNOSIS — I255 Ischemic cardiomyopathy: Secondary | ICD-10-CM | POA: Diagnosis not present

## 2019-06-01 DIAGNOSIS — E782 Mixed hyperlipidemia: Secondary | ICD-10-CM

## 2019-06-01 DIAGNOSIS — E785 Hyperlipidemia, unspecified: Secondary | ICD-10-CM

## 2019-06-01 DIAGNOSIS — I2510 Atherosclerotic heart disease of native coronary artery without angina pectoris: Secondary | ICD-10-CM | POA: Diagnosis not present

## 2019-06-01 DIAGNOSIS — Z006 Encounter for examination for normal comparison and control in clinical research program: Secondary | ICD-10-CM

## 2019-06-01 NOTE — Research (Signed)
Called patient about batwire he said not interested at this time.

## 2019-06-01 NOTE — Patient Instructions (Signed)
Medication Instructions:  Your physician recommends that you continue on your current medications as directed. Please refer to the Current Medication list given to you today.  *If you need a refill on your cardiac medications before your next appointment, please call your pharmacy*   Lab Work: FASTING lab work in 6 months to check cholesterol   If you have labs (blood work) drawn today and your tests are completely normal, you will receive your results only by: MyChart Message (if you have MyChart) OR A paper copy in the mail If you have any lab test that is abnormal or we need to change your treatment, we will call you to review the results.   Testing/Procedures: NONE   Follow-Up: At [facility], you and your health needs are our priority.  As part of our continuing mission to provide you with exceptional heart care, we have created designated Provider Care Teams.  These Care Teams include your primary Cardiologist (physician) and Advanced Practice Providers (APPs -  Physician Assistants and Nurse Practitioners) who all work together to provide you with the care you need, when you need it.  We recommend signing up for the patient portal called "MyChart".  Sign up information is provided on this After Visit Summary.  MyChart is used to connect with patients for Virtual Visits (Telemedicine).  Patients are able to view lab/test results, encounter notes, upcoming appointments, etc.  Non-urgent messages can be sent to your provider as well.   To learn more about what you can do with MyChart, go to [email or web address].    Your next appointment:   6 month(s) - lipid clinic  The format for your next appointment:   In Person  Provider:   K. Chad Hilty, MD   Other Instructions  

## 2019-06-01 NOTE — Progress Notes (Signed)
LIPID CLINIC CONSULT NOTE  Chief Complaint:  Elevated triglycerides  Primary Care Physician: System, Pcp Not In  Primary Cardiologist:  Joshua Breeding, MD  HPI:  Joshua May is a 44 y.o. male who is being seen today for the evaluation of elevated triglycerides at the request of May, Joshua D, NP.  This is a pleasant 44 year old male patient who have a history of ischemic cardiomyopathy and coronary artery disease with prior inferior MI, chronic systolic congestive heart failure and dyslipidemia.  He has been maintained on high potency atorvastatin 80 mg daily.  His lipids last year were well controlled with total cholesterol 110, triglycerides 140, HDL 31 and LDL 51.  Unfortunately repeat lipids now are much higher.  Total cholesterol is 134, triglycerides 381, HDL 24 and LDL of 76.  These lipids were fasting.  He does report some dietary indiscretions over the past year and there is been weight gain.  It seems that the weight gain is not associated with decompensated heart failure.  PMHx:  Past Medical History:  Diagnosis Date  . Asthma   . CAD (coronary artery disease)   . Chronic systolic CHF (congestive heart failure) ([place])   . HLD (hyperlipidemia)    Qualifier: Diagnosis of  By: Percival Spanish, MD, Farrel Gordon    . Hypercholesteremia   . Inferior myocardial infarction [facility]) 2007   PCI of RCA  . Inferior myocardial infarction ([place]) 03/31/2008   PCI of RCA with bare metal stent  . Ischemic cardiomyopathy   . Migraines    and dizziness  . Mitral regurgitation   . Obstructive sleep apnea    Qualifier: Diagnosis of  By: Percival Spanish, MD, Farrel Gordon    . Posterolateral myocardial infarction ([place]) 10/20/2015   PCI of LCx using DES  . Pulmonary embolism ([place]) 02/28/2017  . 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 Tobacco abuse     Past Surgical History:  Procedure Laterality Date  . CARDIAC CATHETERIZATION N/A 10/20/2015    Procedure: Left Heart Cath and Coronary Angiography;  Surgeon: Burnell Blanks, MD;  Location: [place] [facility];  Service: Cardiovascular;  Laterality: N/A;  . CARDIAC CATHETERIZATION N/A 10/20/2015   Procedure: Coronary Stent Intervention;  Surgeon: Burnell Blanks, MD;  Location: [place] CV LAB;  Service: Cardiovascular;  Laterality: N/A;  . ICD IMPLANT N/A 11/18/2017   Procedure: ICD IMPLANT;  Surgeon: Constance Haw, MD;  Location: [place] CV LAB;  Service: Cardiovascular;  Laterality: N/A;  . INTRAVASCULAR PRESSURE WIRE/FFR STUDY N/A 03/03/2017   Procedure: INTRAVASCULAR PRESSURE WIRE/FFR STUDY;  Surgeon: Leonie Man, MD;  Location: [place] CV LAB;  Service: Cardiovascular;  Laterality: N/A;  . LEFT HEART CATH AND CORONARY ANGIOGRAPHY N/A 03/03/2017   Procedure: LEFT HEART CATH AND CORONARY ANGIOGRAPHY;  Surgeon: Leonie Man, MD;  Location: [place] CV LAB;  Service: Cardiovascular;  Laterality: N/A;  . MITRAL VALVE REPAIR Right 07/07/2017   Procedure: MINIMALLY INVASIVE MITRAL VALVE REPAIR (MVR) using Carpentier-McCarthy Adams Ring size 26;  Surgeon: Rexene Alberts, MD;  Location: [place] [place];  Service: Open Heart Surgery;  Laterality: Right;  . MULTIPLE EXTRACTIONS WITH ALVEOLOPLASTY N/A 05/13/2017   Procedure: Extraction of tooth #'s 2,15,16,17 with alveoloplasty and gross debridement of remaining teeth.;  Surgeon: Lenn Cal, DDS;  Location: [place];  Service: Oral Surgery;  Laterality: N/A;  . none    . OTHER SURGICAL  HISTORY     NONE  . PATENT FORAMEN OVALE(PFO) CLOSURE N/A 07/07/2017   Procedure: PATENT FORAMEN OVALE (PFO) CLOSURE;  Surgeon: Purcell Nails, MD;  [facility];  Service: Open Heart Surgery;  Laterality: N/A;  . RIGHT HEART CATH N/A 09/22/2017   Procedure: RIGHT HEART CATH;  Surgeon: Laurey Morale, MD;  Location: [facility] INVASIVE CV LAB;  Service: Cardiovascular;  Laterality: N/A;  . TEE WITHOUT CARDIOVERSION N/A 04/19/2017    Procedure: TRANSESOPHAGEAL ECHOCARDIOGRAM (TEE);  Surgeon: Laurey Morale, MD;  Location: [facility] ENDOSCOPY;  Service: Cardiovascular;  Laterality: N/A;  . TEE WITHOUT CARDIOVERSION N/A 07/07/2017   Procedure: TRANSESOPHAGEAL ECHOCARDIOGRAM (TEE);  Surgeon: Purcell Nails, MD;  Location: [facility] OR;  Service: Open Heart Surgery;  Laterality: N/A;    FAMHx:  Family History  Problem Relation Age of Onset  . Heart attack Sister 64       S/P CABG  . Heart attack Father   . Deep vein thrombosis Father     SOCHx:   reports that he has quit smoking. His smoking use included cigarettes. He has a 8.50 pack-year smoking history. He has never used smokeless tobacco. He reports previous alcohol use. He reports previous drug use. Drug: Marijuana.  ALLERGIES:  Allergies  Allergen Reactions  . Shellfish Allergy Anaphylaxis    ROS: Pertinent items noted in HPI and remainder of comprehensive ROS otherwise negative.  HOME MEDS: Current Outpatient Medications on File Prior to Visit  Medication Sig Dispense Refill  . acetaminophen (TYLENOL) 500 MG tablet Take 1,000 mg by mouth daily as needed for moderate pain or headache.    Marland Kitchen atorvastatin (LIPITOR) 80 MG tablet Take 1 tablet (80 mg total) by mouth daily. Needs appt for further refills 30 tablet 3  . carvedilol (COREG) 12.5 MG tablet Take 1 tablet (12.5 mg total) by mouth 2 (two) times daily. 180 tablet 2  . dapagliflozin propanediol (FARXIGA) 10 MG TABS tablet Take 10 mg by mouth daily before breakfast. 30 tablet 11  . digoxin (LANOXIN) 0.125 MG tablet Take 1 tablet (125 mcg total) by mouth daily. 30 tablet 3  . ferrous sulfate 325 (65 FE) MG tablet Take 1 tablet (325 mg total) by mouth daily with breakfast. For one month then stop. 30 tablet 3  . folic acid (FOLVITE) 1 MG tablet Take 1 tablet (1 mg total) by mouth daily. For one month then stop.    . furosemide (LASIX) 40 MG tablet Take 1 tablet (40 mg total) by mouth daily. 34 tablet 3  . potassium  chloride SA (KLOR-CON) 20 MEQ tablet Take 1 tablet (20 mEq total) by mouth daily. 30 tablet 3  . psyllium (METAMUCIL) 58.6 % powder Take 1 packet by mouth daily as needed (constipation).    . sacubitril-valsartan (ENTRESTO) 24-26 MG Take 1 tablet by mouth 2 (two) times daily. 60 tablet 11  . spironolactone (ALDACTONE) 25 MG tablet TAKE 1 TABLET (25 MG TOTAL) BY MOUTH EVERY MORNING 30 tablet 3  . warfarin (COUMADIN) 7.5 MG tablet TAKE 1/2 TO 1 TABLET BY MOUTH  DAILY AS DIRECTED BY COUMADIN CLINIC 90 tablet 0   No current facility-administered medications on file prior to visit.    LABS/IMAGING: No results found for this or any previous visit (from the past 48 hour(s)). No results found.  LIPID PANEL:    Component Value Date/Time   CHOL 134 05/23/2019 0910   TRIG 381 (H) 05/23/2019 0910   HDL 24 (L) 05/23/2019 9798  CHOLHDL 5.6 05/23/2019 0910   VLDL 76 (H) 05/23/2019 0910   LDLCALC 34 05/23/2019 0910   LDLDIRECT 149.8 11/15/2009 0933    WEIGHTS: Wt Readings from Last 3 Encounters:  06/01/19 288 lb (130.6 kg)  05/23/19 290 lb 13.6 oz (131.9 kg)  06/23/18 274 lb 9.6 oz (124.6 kg)    VITALS: BP 119/83   Pulse 84   Ht 5\' 11"  (1.803 m)   Wt 288 lb (130.6 kg)   SpO2 96%   BMI 40.17 kg/m   EXAM: General appearance: alert, no distress and morbidly obese Neck: no carotid bruit, no JVD and thyroid not enlarged, symmetric, no tenderness/mass/nodules Lungs: clear to auscultation bilaterally Heart: regular rate and rhythm Abdomen: soft, non-tender; bowel sounds normal; no masses,  no organomegaly Extremities: extremities normal, atraumatic, no cyanosis or edema Pulses: 2+ and symmetric Skin: Skin color, texture, turgor normal. No rashes or lesions Neurologic: Grossly normal Psych: Pleasant  EKG: Deferred  ASSESSMENT: 1. High triglycerides 2. Ischemic cardiomyopathy 3. Chronic systolic congestive heart failure 4. Morbid obesity  PLAN: 1.   Mr. Bussiere has had a  significant increase in his triglycerides.  This is been an issue in the past several years ago however was much better controlled last year.  I think it is primarily dietary.  Has had weight gain and less physical activity over the past year.  He is now in the morbid obese category.  I would recommend aggressive dietary intervention to try to get his numbers lower.  We will give him 6 months to do that I provided some dietary information.  He is also working with the dietitian apparently in the heart failure clinic who could better advise him at reducing saturated fats.  I do think it would be helpful for him to consider maintenance cardiac rehabilitation and will defer that to the heart failure clinic.  More exercise will be helpful with those numbers as well.  Plan repeat lipids in 6 months.  If he is not at goal then will consider adding Vascepa.  Thanks for the kind referral.  Ellery Plunk, MD, [facility]    [facility]  Medical Director of the Advanced Lipid Disorders &  Cardiovascular Risk Reduction Clinic Diplomate of the American Board of Clinical Lipidology Attending Cardiologist  Direct Dial: [phone]  Fax: [phone]  Website:  [email or web address] Doreena Maulden 06/01/2019, 10:08 AM

## 2019-06-07 ENCOUNTER — Encounter: Payer: Self-pay | Admitting: Internal Medicine

## 2019-06-07 ENCOUNTER — Other Ambulatory Visit: Payer: Self-pay

## 2019-06-07 ENCOUNTER — Ambulatory Visit (INDEPENDENT_AMBULATORY_CARE_PROVIDER_SITE_OTHER): Payer: Medicaid Other | Admitting: Internal Medicine

## 2019-06-07 VITALS — BP 109/73 | HR 94 | Temp 97.2°F | Resp 17 | Ht 70.0 in | Wt 284.0 lb

## 2019-06-07 DIAGNOSIS — J4520 Mild intermittent asthma, uncomplicated: Secondary | ICD-10-CM

## 2019-06-07 DIAGNOSIS — I5022 Chronic systolic (congestive) heart failure: Secondary | ICD-10-CM | POA: Diagnosis not present

## 2019-06-07 DIAGNOSIS — I2699 Other pulmonary embolism without acute cor pulmonale: Secondary | ICD-10-CM

## 2019-06-07 DIAGNOSIS — I2510 Atherosclerotic heart disease of native coronary artery without angina pectoris: Secondary | ICD-10-CM

## 2019-06-07 DIAGNOSIS — Z7689 Persons encountering health services in other specified circumstances: Secondary | ICD-10-CM | POA: Diagnosis not present

## 2019-06-07 MED ORDER — ALBUTEROL SULFATE HFA 108 (90 BASE) MCG/ACT IN AERS: 2.0000 | INHALATION_SPRAY | Freq: Four times a day (QID) | RESPIRATORY_TRACT | 1 refills | Status: DC | PRN

## 2019-06-07 MED FILL — ALBUTEROL SULFATE HFA 108 (: 108 (90 BAS | 25 days supply | Qty: 18 | Fill #0

## 2019-06-07 NOTE — Patient Instructions (Signed)
Thank you for choosing Primary Care at [facility] to be your medical home!    Brittin H Leppo was seen by De Hollingshead, DO today.   Demetria Pore Kosak's primary care provider is Marcy Siren, DO.   For the [place] care possible, you should try to see Marcy Siren, DO whenever you come to the clinic.   We look forward to seeing you again soon!  If you have any questions about your visit today, please call us at [phone] or feel free to reach your primary care provider via MyChart.

## 2019-06-07 NOTE — Progress Notes (Signed)
Subjective:    Joshua May - 44 y.o. male MRN [id number]  Date of birth: Apr 07, 1975  HPI  [facility] is to establish care. Patient has a PMH significant for CAD, MV regurgitation s/p repair, CHF, PE, OSA, asthma, HLD, lupus anticoagulation disorder.   He reports that he is followed by cardiology regularly. He has his INR checked at least once monthly as he is on Coumadin.   Reports no concerns for volume status. Heart failure has been stable.     ROS per HPI   Health Maintenance:  Health Maintenance Due  Topic Date Due  . COVID-19 Vaccine (1) Never done     Past Medical History: Patient Active Problem List   Diagnosis Date Noted  . Encounter for therapeutic drug monitoring 07/16/2017  . 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  . S/P mitral valve repair 07/07/2017  . Chronic apical periodontitis 05/04/2017  . Retained dental root 05/04/2017  . Dental caries 05/04/2017  . Chronic periodontitis 05/04/2017  . Accretions on teeth 05/04/2017  . Lupus anticoagulant disorder ([place]) 03/10/2017  . Chronic systolic CHF (congestive heart failure) ([place])   . Mitral regurgitation   . Ischemic cardiomyopathy   . Pulmonary embolism ([place]) 02/28/2017  . CAD (coronary artery disease)   . DYSPHAGIA UNSPECIFIED 09/04/2008  . HLD (hyperlipidemia) 04/27/2008  . Obstructive sleep apnea 04/27/2008  . TOBACCO ABUSE 04/26/2008  . ASTHMA 04/26/2008      Social History   reports that he has quit smoking. His smoking use included cigarettes. He has a 8.50 pack-year smoking history. He has never used smokeless tobacco. He reports previous alcohol use. He reports previous drug use. Drug: Marijuana.   Family History  family history includes Deep vein thrombosis in his father; Heart attack in his father; Heart attack (age of onset: 41) in his sister.   Medications: reviewed and updated   Objective:   Physical Exam BP 109/73   Pulse 94   Temp (!) 97.2 F (36.2 C) (Temporal)   Resp  17   Ht 5\' 10"  (1.778 m)   Wt 284 lb (128.8 kg)   SpO2 95%   BMI 40.75 kg/m  Physical Exam  Constitutional: He is oriented to person, place, and time and well-developed, well-nourished, and in no distress. No distress.  HENT:  Head: Normocephalic and atraumatic.  Eyes: Conjunctivae and EOM are normal.  Cardiovascular: Normal rate, regular rhythm and normal heart sounds.  No murmur heard. Pulmonary/Chest: Effort normal and breath sounds normal. No respiratory distress.  Musculoskeletal:        General: Normal range of motion.  Neurological: He is alert and oriented to person, place, and time.  Skin: Skin is warm and dry. He is not diaphoretic.  Psychiatric: Affect and judgment normal.        Assessment & Plan:   1. Encounter to establish care Reviewed patient's PMH, social history, surgical history, and medications.    2. Chronic systolic CHF (congestive heart failure) (HCC) Volume status is stable. Continue current medication regimen. Followed by cardiology.   3. Coronary artery disease involving native coronary artery of native heart without angina pectoris Continue Lipitor.   4. Other acute pulmonary embolism without acute cor pulmonale ([place]) Patient is on Coumadin. Has INR monitoring done with cardiology.   5. Mild intermittent asthma, unspecified whether complicated - albuterol (VENTOLIN HFA) 108 (90 Base) MCG/ACT inhaler; Inhale 2 puffs into the lungs every 6 (six) hours as needed for wheezing or shortness of breath.  Dispense: 18 g; Refill: 1     Marcy Siren, D.O. 06/07/2019, 3:27 PM Primary Care at [facility]

## 2019-06-14 MED FILL — FUROSEMIDE 40 MG TAB: 40 | 34 days supply | Qty: 34 | Fill #3

## 2019-06-15 ENCOUNTER — Other Ambulatory Visit: Payer: Self-pay

## 2019-06-15 ENCOUNTER — Ambulatory Visit (INDEPENDENT_AMBULATORY_CARE_PROVIDER_SITE_OTHER): Payer: Medicaid Other | Admitting: *Deleted

## 2019-06-15 DIAGNOSIS — Z5181 Encounter for therapeutic drug level monitoring: Secondary | ICD-10-CM

## 2019-06-15 LAB — POCT INR: INR: 2.2 (ref 2.0–3.0)

## 2019-06-15 NOTE — Patient Instructions (Signed)
Description   Continue taking 1 tablet daily except 1/2 tablet on Mondays, Wednesdays, and Fridays.  Recheck INR in 5 weeks. Call with any new medications or procedures Coumadin Clinic [phone] Main [phone]     

## 2019-06-22 MED FILL — ATORVASTATIN 80 MG TABLET: 80 | 30 days supply | Qty: 30 | Fill #1

## 2019-06-22 MED FILL — FARXIGA 10 MG TABLET: 10 | 30 days supply | Qty: 30 | Fill #1

## 2019-06-22 MED FILL — POTASSIUM CHLORIDE CRYS ER: 20 | 30 days supply | Qty: 30 | Fill #5

## 2019-06-22 MED FILL — DIGOXIN 0.125 MG TABLET: 125 | 30 days supply | Qty: 30 | Fill #5

## 2019-06-22 MED FILL — SPIRONOLACTONE 25 MG TABS: 25 | 30 days supply | Qty: 30 | Fill #1

## 2019-07-07 ENCOUNTER — Telehealth (HOSPITAL_COMMUNITY): Payer: Self-pay | Admitting: Licensed Clinical Social Worker

## 2019-07-07 NOTE — Telephone Encounter (Signed)
CSW contacted patient to inform that the [facility] is starting back up. Meeting will be held on Tuesday July 11, 2019 at 3:30pm in the H&V Conference Room. Patient grateful for the call. Jackie Clemens Lachman, LCSW, CCSW-MCS [phone] 

## 2019-07-19 ENCOUNTER — Other Ambulatory Visit: Payer: Self-pay | Admitting: Cardiology

## 2019-07-19 MED FILL — SPIRONOLACTONE 25 MG TABS: 25 | 30 days supply | Qty: 30 | Fill #2

## 2019-07-19 MED FILL — DIGOXIN 0.125 MG TABLET: 125 | 30 days supply | Qty: 30 | Fill #6

## 2019-07-19 MED FILL — FUROSEMIDE 40 MG TAB: 40 | 34 days supply | Qty: 34 | Fill #0

## 2019-07-19 MED FILL — POTASSIUM CHLORIDE CRYS ER: 20 | 30 days supply | Qty: 30 | Fill #6

## 2019-07-19 MED FILL — FARXIGA 10 MG TABLET: 10 | 30 days supply | Qty: 30 | Fill #2

## 2019-07-20 ENCOUNTER — Other Ambulatory Visit: Payer: Self-pay

## 2019-07-20 ENCOUNTER — Ambulatory Visit (INDEPENDENT_AMBULATORY_CARE_PROVIDER_SITE_OTHER): Payer: Medicare Other

## 2019-07-20 DIAGNOSIS — Z9889 Other specified postprocedural states: Secondary | ICD-10-CM

## 2019-07-20 DIAGNOSIS — I340 Nonrheumatic mitral (valve) insufficiency: Secondary | ICD-10-CM

## 2019-07-20 DIAGNOSIS — Z5181 Encounter for therapeutic drug level monitoring: Secondary | ICD-10-CM | POA: Diagnosis not present

## 2019-07-20 DIAGNOSIS — D6862 Lupus anticoagulant syndrome: Secondary | ICD-10-CM

## 2019-07-20 LAB — POCT INR: INR: 2 (ref 2.0–3.0)

## 2019-07-20 NOTE — Patient Instructions (Signed)
Continue taking 1 tablet daily except 1/2 tablet on Mondays, Wednesdays, and Fridays.  Recheck INR in 6 weeks. Call with any new medications or procedures Coumadin Clinic [phone] Main [phone]

## 2019-07-25 ENCOUNTER — Telehealth: Payer: Self-pay

## 2019-07-25 MED FILL — ATORVASTATIN 80 MG TABLET: 80 | 30 days supply | Qty: 30 | Fill #2

## 2019-07-25 NOTE — Telephone Encounter (Signed)
Called patient to do their pre-visit COVID screening.  Call went to voicemail. Unable to do prescreening.  

## 2019-07-26 ENCOUNTER — Ambulatory Visit: Payer: Medicaid Other | Admitting: Internal Medicine

## 2019-08-10 ENCOUNTER — Ambulatory Visit (INDEPENDENT_AMBULATORY_CARE_PROVIDER_SITE_OTHER): Payer: Medicare Other | Admitting: *Deleted

## 2019-08-10 DIAGNOSIS — I255 Ischemic cardiomyopathy: Secondary | ICD-10-CM

## 2019-08-10 LAB — CUP PACEART REMOTE DEVICE CHECK
Battery Remaining Longevity: 127 mo
Battery Voltage: 3.03 V
Brady Statistic RV Percent Paced: 0 %
Date Time Interrogation Session: 20210722042305
HighPow Impedance: 80 Ohm
Implantable Lead Implant Date: 20191031
Implantable Lead Location: 753860
Implantable Lead Model: 6935
Implantable Pulse Generator Implant Date: 20191031
Lead Channel Impedance Value: 418 Ohm
Lead Channel Impedance Value: 475 Ohm
Lead Channel Pacing Threshold Amplitude: 0.625 V
Lead Channel Pacing Threshold Pulse Width: 0.4 ms
Lead Channel Sensing Intrinsic Amplitude: 12.25 mV
Lead Channel Sensing Intrinsic Amplitude: 12.25 mV
Lead Channel Setting Pacing Amplitude: 2 V
Lead Channel Setting Pacing Pulse Width: 0.4 ms
Lead Channel Setting Sensing Sensitivity: 0.3 mV

## 2019-08-11 NOTE — Progress Notes (Signed)
Remote ICD transmission.   

## 2019-08-14 ENCOUNTER — Telehealth (HOSPITAL_COMMUNITY): Payer: Self-pay | Admitting: Licensed Clinical Social Worker

## 2019-08-14 NOTE — Telephone Encounter (Signed)
CSW contacted patient to invite to the [facility] for HF patients to be held tomorrow in the Heart and Vascular Conference room at 3:30pm. Message left.  Jackie Brentyn Seehafer, LCSW, CCSW-MCS [phone]  

## 2019-08-21 MED FILL — FARXIGA 10 MG TABLET: 10 | 30 days supply | Qty: 30 | Fill #3

## 2019-08-21 MED FILL — ATORVASTATIN 80 MG TABLET: 80 | 30 days supply | Qty: 30 | Fill #0

## 2019-08-21 MED FILL — FUROSEMIDE 40 MG TAB: 40 | 34 days supply | Qty: 34 | Fill #0

## 2019-08-21 MED FILL — SPIRONOLACTONE 25 MG TABS: 25 | 30 days supply | Qty: 30 | Fill #3

## 2019-08-21 MED FILL — POTASSIUM CHLORIDE CRYS ER: 20 | 30 days supply | Qty: 30 | Fill #0

## 2019-08-21 MED FILL — DIGOXIN 0.125 MG TABLET: 125 | 30 days supply | Qty: 30 | Fill #0

## 2019-08-23 ENCOUNTER — Encounter (HOSPITAL_COMMUNITY): Payer: Self-pay | Admitting: Cardiology

## 2019-08-23 ENCOUNTER — Ambulatory Visit (HOSPITAL_BASED_OUTPATIENT_CLINIC_OR_DEPARTMENT_OTHER)
Admission: RE | Admit: 2019-08-23 | Discharge: 2019-08-23 | Disposition: A | Discharge status: Home / Self Care | Payer: Medicare Other | Source: Ambulatory Visit | Attending: Cardiology | Admitting: Cardiology

## 2019-08-23 ENCOUNTER — Other Ambulatory Visit (HOSPITAL_COMMUNITY): Payer: Self-pay | Admitting: Cardiology

## 2019-08-23 ENCOUNTER — Other Ambulatory Visit: Payer: Self-pay

## 2019-08-23 ENCOUNTER — Ambulatory Visit (HOSPITAL_COMMUNITY)
Admission: RE | Admit: 2019-08-23 | Discharge: 2019-08-23 | Disposition: A | Discharge status: Home / Self Care | Payer: Medicare Other | Source: Ambulatory Visit | Attending: Cardiology | Admitting: Cardiology

## 2019-08-23 VITALS — BP 130/76 | HR 75 | Wt 281.5 lb

## 2019-08-23 DIAGNOSIS — Z7901 Long term (current) use of anticoagulants: Secondary | ICD-10-CM | POA: Diagnosis not present

## 2019-08-23 DIAGNOSIS — Z87891 Personal history of nicotine dependence: Secondary | ICD-10-CM | POA: Diagnosis not present

## 2019-08-23 DIAGNOSIS — I5022 Chronic systolic (congestive) heart failure: Secondary | ICD-10-CM

## 2019-08-23 DIAGNOSIS — Z8249 Family history of ischemic heart disease and other diseases of the circulatory system: Secondary | ICD-10-CM | POA: Diagnosis not present

## 2019-08-23 DIAGNOSIS — I252 Old myocardial infarction: Secondary | ICD-10-CM | POA: Insufficient documentation

## 2019-08-23 DIAGNOSIS — Z79899 Other long term (current) drug therapy: Secondary | ICD-10-CM | POA: Diagnosis not present

## 2019-08-23 DIAGNOSIS — E785 Hyperlipidemia, unspecified: Secondary | ICD-10-CM | POA: Diagnosis not present

## 2019-08-23 DIAGNOSIS — Z955 Presence of coronary angioplasty implant and graft: Secondary | ICD-10-CM | POA: Diagnosis not present

## 2019-08-23 DIAGNOSIS — I2510 Atherosclerotic heart disease of native coronary artery without angina pectoris: Secondary | ICD-10-CM | POA: Diagnosis not present

## 2019-08-23 DIAGNOSIS — I255 Ischemic cardiomyopathy: Secondary | ICD-10-CM | POA: Diagnosis not present

## 2019-08-23 DIAGNOSIS — Z9889 Other specified postprocedural states: Secondary | ICD-10-CM

## 2019-08-23 DIAGNOSIS — Z95810 Presence of automatic (implantable) cardiac defibrillator: Secondary | ICD-10-CM | POA: Insufficient documentation

## 2019-08-23 DIAGNOSIS — I340 Nonrheumatic mitral (valve) insufficiency: Secondary | ICD-10-CM | POA: Diagnosis not present

## 2019-08-23 DIAGNOSIS — J449 Chronic obstructive pulmonary disease, unspecified: Secondary | ICD-10-CM | POA: Insufficient documentation

## 2019-08-23 LAB — BASIC METABOLIC PANEL
Anion gap: 8 (ref 5–15)
BUN: 15 mg/dL (ref 6–20)
CO2: 26 mmol/L (ref 22–32)
Calcium: 9 mg/dL (ref 8.9–10.3)
Chloride: 103 mmol/L (ref 98–111)
Creatinine, Ser: 0.98 mg/dL (ref 0.61–1.24)
GFR calc Af Amer: >60 mL/min (ref >60)
GFR calc non Af Amer: >60 mL/min (ref >60)
Glucose, Bld: 101 mg/dL — ABNORMAL HIGH (ref 70–99)
Potassium: 4.6 mmol/L (ref 3.5–5.1)
Sodium: 137 mmol/L (ref 135–145)

## 2019-08-23 LAB — CBC
HCT: 44.1 % (ref 39.0–52.0)
Hemoglobin: 13.7 g/dL (ref 13.0–17.0)
MCH: 30.2 pg (ref 26.0–34.0)
MCHC: 31.1 g/dL (ref 30.0–36.0)
MCV: 97.4 fL (ref 80.0–100.0)
Platelets: 289 10*3/uL (ref 150–400)
RBC: 4.53 MIL/uL (ref 4.22–5.81)
RDW: 12.2 % (ref 11.5–15.5)
WBC: 8.5 10*3/uL (ref 4.0–10.5)
nRBC: 0 % (ref 0.0–0.2)

## 2019-08-23 LAB — ECHOCARDIOGRAM COMPLETE
Area-P 1/2: 2.22 cm2
Calc EF: 41.5 %
S' Lateral: 3.6 cm
Single Plane A2C EF: 45.4 %
Single Plane A4C EF: 40.4 %

## 2019-08-23 MED ORDER — SPIRIVA HANDIHALER 18 MCG IN CAPS
18.0000 ug | ORAL_CAPSULE | Freq: Every day | RESPIRATORY_TRACT | 12 refills | Status: DC
Start: 2019-08-23 — End: 2022-02-24

## 2019-08-23 MED ORDER — ENTRESTO 49-51 MG PO TABS
1.0000 | ORAL_TABLET | Freq: Two times a day (BID) | ORAL | 6 refills | Status: DC
Start: 2019-08-23 — End: 2019-10-24

## 2019-08-23 MED ORDER — ICOSAPENT ETHYL 1 G PO CAPS
2.0000 g | ORAL_CAPSULE | Freq: Two times a day (BID) | ORAL | 6 refills | Status: DC
Start: 2019-08-23 — End: 2019-09-06

## 2019-08-23 MED FILL — SPIRIVA 18 MCG CP-HANDIHALE: 18 | 30 days supply | Qty: 30 | Fill #0

## 2019-08-23 MED FILL — ENTRESTO 49 MG-51 MG TABLET: 49-51 | 30 days supply | Qty: 60 | Fill #0

## 2019-08-23 NOTE — Progress Notes (Signed)
PCP: None  HF Cardiology: Dr Shirlee Latch  CT Surgery: Dr Cornelius Moras  HPI: Joshua May is a 44 y.o. [place] male with a history of CAD dating back to 2007 when he had a PCI in [place]. In 2010 he presented with a STEMI and had an RCA DES placed. He followed up for a year but then didn't present again till Oct 2017 when he presented with a CFX infarct. Cath revealed 50% ISR of the RCA, total mCFX and 85% OM3. He had CFX PCI with DES and OM3 POBA. His EF then was 50-55%.  Admitted 02/27/17 with increased dyspnea and chest pain. CTA confirmed PE. ECHO completed and showed reduced EF 20-25%. Underwent LHC as noted below. [place] no evidence DVTs. CT surgery consulted for severe MR. Plan to optimize HF medications.Set up TEE as an outpatient. Discharge weight 222 pounds.   Admitted 06/2017 with scheduled MV repair. S/P Minimally invasive mitral valve repair on 6/19. ECHO 07/12/17 post surgery, no MR was noted but EF remained 25-30%. Tachycardic at the time of discharge. Discharge weight 257 pounds.   CPX was submaximal in 8/19, suggestive of marked deconditioning.  RHC in 9/19 showed relatively preserved cardiac index with only mildly elevated filling pressures.  Echo in 9/19 showed EF 30-35%. MDT ICD placed in 2019.   Echo was done today and reviewed: EF 45%,RV normal, s/p MV repair with mean gradient 5 and MVA 1.78 cm^2 (mild mitral stenosis), trivial MR.   He returns today for followup of CHF and CAD.  He is short of breath if he walks fast or goes up inclines, does ok with steady pace.  No orthopnea/PND.  No chest pain.    ECG (personally reviewed): NSR, old inferior MI, poor RWP  Labs (7/19): K 4.5, creatinine 0.95, LDL 51 Labs (8/19): K 3.8, creatinine 0.99 Labs (5/21): K 4.8, creatinine 1.08, LDL 34, TGs 381  PMH: 1. CAD: Initial PCI in 2007 in [place], inferior MI.   - Inferior STEMI 2010 with RCA PCI.  - CFx infarct in 2017 with LCx DES and OM3 POBA.  - LHC (2/19): Nonobstructive CAD.  2. Mitral regurgitation:  Infarct-related MR.  - TEE (4/19): LVEF 35-40%, Severe MR with PISA ERO 0.43 cm^2. Suspect infarct related MR with tethering of the posterior leaflet.  - Minimally invasive MV repair in 6/19.   3. Chronic systolic CHF: Ischemic cardiomyopathy. Medtronic ICD.  - Echo (2/19): EF 25-30%, diffuse HK with inferolateral AK, mildly decreased RV systolic function with D-shaped septum, moderate-severe infarct-related MR.  - Echo (6/19): EF 25-30%, apical and inferolateral hypokinesis, MV repair with mean gradient 9 mmHg with no MR, mildly decreased RV systolic function.  - CPX (8/19): VO2 13.2, VE/VCO2 slope 28, RER 0.87.  This study was suggestive of severe deconditioning.  - RHC (9/19): mean RA 9, PA 39/23 mean 24, mean PCWP 17, CI 2.2, PVR 1.35 WU - Echo (9/19): EF 30-35%, diffuse hypokinesis, normal RV, s/p MV repair with mean gradient 6 mmHg and PHT 104 msec => probably no significant stenosis.  - Echo (8/21): EF 45%, RV normal, s/p MV repair with mean gradient 5 and MVA 1.78 cm^2 (mild mitral stenosis), trivial MR.  4. Hyperlipidemia 5. PE: 2/19.  6. COPD: Moderate COPD on 6/19 PFTs.   Review of systems complete and found to be negative unless listed in HPI.    Social History   Socioeconomic History  . Marital status: Married    Spouse name: Not on file  . Number  of children: Not on file  . Years of education: Not on file  . Highest education level: Not on file  Occupational History  . Not on file  Tobacco Use  . Smoking status: Former Smoker    Packs/day: 0.50    Years: 17.00    Pack years: 8.50    Types: Cigarettes  . Smokeless tobacco: Never Used  . Tobacco comment: quit since heart attack in 10/2015  Vaping Use  . Vaping Use: Never used  Substance and Sexual Activity  . Alcohol use: Not Currently  . Drug use: Not Currently    Types: Marijuana  . Sexual activity: Not on file  Other Topics Concern  . Not on file  Social History Narrative   The patient is married with two  children.   Social Determinants of Health   Financial Resource Strain:   . Difficulty of Paying Living Expenses:   Food Insecurity:   . Worried About Programme researcher, broadcasting/film/video in the Last Year:   . Barista in the Last Year:   Transportation Needs:   . Freight forwarder (Medical):   Marland Kitchen Lack of Transportation (Non-Medical):   Physical Activity:   . Days of Exercise per Week:   . Minutes of Exercise per Session:   Stress:   . Feeling of Stress :   Social Connections:   . Frequency of Communication with Friends and Family:   . Frequency of Social Gatherings with Friends and Family:   . Attends Religious Services:   . Active Member of Clubs or Organizations:   . Attends Banker Meetings:   Marland Kitchen Marital Status:   Intimate Partner Violence:   . Fear of Current or Ex-Partner:   . Emotionally Abused:   Marland Kitchen Physically Abused:   . Sexually Abused:    Family History  Problem Relation Age of Onset  . Heart attack Sister 53       S/P CABG  . Heart attack Father   . Deep vein thrombosis Father     Current Outpatient Medications  Medication Sig Dispense Refill  . albuterol (VENTOLIN HFA) 108 (90 Base) MCG/ACT inhaler Inhale 2 puffs into the lungs every 6 (six) hours as needed for wheezing or shortness of breath. 18 g 1  . atorvastatin (LIPITOR) 80 MG tablet Take 1 tablet (80 mg total) by mouth daily. Needs appt for further refills 30 tablet 3  . carvedilol (COREG) 12.5 MG tablet Take 1 tablet (12.5 mg total) by mouth 2 (two) times daily. 180 tablet 2  . dapagliflozin propanediol (FARXIGA) 10 MG TABS tablet Take 10 mg by mouth daily before breakfast. 30 tablet 11  . furosemide (LASIX) 40 MG tablet Take 1 tablet (40 mg total) by mouth daily. 34 tablet 3  . potassium chloride SA (KLOR-CON) 20 MEQ tablet Take 1 tablet (20 mEq total) by mouth daily. 30 tablet 3  . spironolactone (ALDACTONE) 25 MG tablet TAKE 1 TABLET (25 MG TOTAL) BY MOUTH EVERY MORNING 30 tablet 3  . warfarin  (COUMADIN) 7.5 MG tablet TAKE 1/2 TO 1 TABLET BY MOUTH  DAILY AS DIRECTED BY COUMADIN CLINIC 90 tablet 0  . icosapent Ethyl (VASCEPA) 1 g capsule Take 2 capsules (2 g total) by mouth 2 (two) times daily. 120 capsule 6  . sacubitril-valsartan (ENTRESTO) 49-51 MG Take 1 tablet by mouth 2 (two) times daily. 60 tablet 6  . tiotropium (SPIRIVA HANDIHALER) 18 MCG inhalation capsule Place 1 capsule (18 mcg total) into  inhaler and inhale daily. 30 capsule 12   No current facility-administered medications for this encounter.   Vitals:   08/23/19 1105  BP: 130/76  Pulse: 75  SpO2: 98%  Weight: 127.7 kg (281 lb 8 oz)    Wt Readings from Last 3 Encounters:  08/23/19 127.7 kg (281 lb 8 oz)  06/07/19 128.8 kg (284 lb)  06/01/19 130.6 kg (288 lb)    PHYSICAL EXAM: General: NAD Neck: No JVD, no thyromegaly or thyroid nodule.  Lungs: Distant BS.  CV: Nondisplaced PMI.  Heart regular S1/S2, no S3/S4, no murmur.  No peripheral edema.  No carotid bruit.  Normal pedal pulses.  Abdomen: Soft, nontender, no hepatosplenomegaly, no distention.  Skin: Intact without lesions or rashes.  Neurologic: Alert and oriented x 3.  Psych: Normal affect. Extremities: No clubbing or cyanosis.  HEENT: Normal.    ASSESSMENT & PLAN: 1. Chronic systolic CHF: Ischemic cardiomyopathy. Echo in 6/19 with EF 25-30%, echo 9/19 with EF 30-35%.  Medtronic ICD.  Echo done today showed EF up to 45%.  He is not volume overloaded on exam. NYHA class II-III symptoms, suspect symptoms are more due to COPD than to CHF.    - With improved EF to 45%, think he can stop digoxin.  - Continue spironolactone 25 mg daily.   - Continue Coreg 12.5 mg bid.  - Increase Entresto to 49/51 bid with BMET today and 10 days.   - Continue Lasix 40 mg daily.   2. CAD: Stents in LCx and RCA. LHC 02/2017 showed nonobstructive disease. No chest pain.   - Continue atorvastatin, good lipids 5/21. - He is on warfarin so no ASA 81.  3. PE: In 2019, no  definite trigger. Father with h/o VTE.  He should likely have long-term anticoagulation.   - He is on warfarin.   4. Mitral regurgitation: Ischemic MR.  S/p minimally invasive MV repair in 6/19. Post-op echo 6/19 showed no MR but mean gradient 9 mmHg across MV. Echo (9/19) with mean MV gradient 6 mmHg but PHT short at 104 msec, probably no significant stenosis.  Echo today showed mean gradient 5 mmHg across repaired MV with MVA 1.78 cm^2.  Suspect mild mitral stenosis, only trivial MR.  5. COPD: I think this is a major source for dyspnea.   - I will have him try Spiriva daily to see if this helps him symptomatically.   6. Hyperlipidemia: Good LDL in 5/21, TGs remain significantly elevated.  - Continue statin.  - I will see if we can get Vascepa 2 g bid for him.   Followup in 4 months.   Marca Ancona 08/23/2019

## 2019-08-23 NOTE — Progress Notes (Signed)
  Echocardiogram 2D Echocardiogram has been performed.  Joshua May 08/23/2019, 11:05 AM

## 2019-08-23 NOTE — Patient Instructions (Signed)
STOP Digoxin START Vascepa 2 g twice a day INCREASE Entresto 49/51 mg, one tab twice a day  Labs today We will only contact you if something comes back abnormal or we need to make some changes. Otherwise no news is good news!  Labs needed in 10 days and fasting labs needed in 2 months  Your physician recommends that you schedule a follow-up appointment in: 4 months  in the Advanced Practitioners (PA/NP) Clinic   Do the following things EVERYDAY: 1) Weigh yourself in the morning before breakfast. Write it down and keep it in a log. 2) Take your medicines as prescribed 3) Eat low salt foods--Limit salt (sodium) to 2000 mg per day.  4) Stay as active as you can everyday 5) Limit all fluids for the day to less than 2 liters  If you have any questions or concerns before your next appointment please send [place] a message through [place] or call our office at [phone].    TO LEAVE A MESSAGE FOR THE NURSE SELECT OPTION 2, PLEASE LEAVE A MESSAGE INCLUDING: . YOUR NAME . DATE OF BIRTH . CALL BACK NUMBER . REASON FOR CALL**this is important as we prioritize the call backs  YOU WILL RECEIVE A CALL BACK THE SAME DAY AS LONG AS YOU CALL BEFORE 4:00 PM

## 2019-08-29 MED FILL — CARVEDILOL 12.5 MG TABLET: 12.5 | 90 days supply | Qty: 180 | Fill #1

## 2019-08-31 ENCOUNTER — Ambulatory Visit (INDEPENDENT_AMBULATORY_CARE_PROVIDER_SITE_OTHER): Payer: Medicare Other

## 2019-08-31 ENCOUNTER — Other Ambulatory Visit: Payer: Self-pay

## 2019-08-31 DIAGNOSIS — Z9889 Other specified postprocedural states: Secondary | ICD-10-CM | POA: Diagnosis not present

## 2019-08-31 DIAGNOSIS — Z5181 Encounter for therapeutic drug level monitoring: Secondary | ICD-10-CM | POA: Diagnosis not present

## 2019-08-31 DIAGNOSIS — I340 Nonrheumatic mitral (valve) insufficiency: Secondary | ICD-10-CM

## 2019-08-31 DIAGNOSIS — D6862 Lupus anticoagulant syndrome: Secondary | ICD-10-CM | POA: Diagnosis not present

## 2019-08-31 LAB — POCT INR: INR: 1.5 — AB (ref 2.0–3.0)

## 2019-08-31 NOTE — Patient Instructions (Signed)
Description   Take 1.5 tablets today, then start taking 1 tablet daily except 1/2 tablet on Mondays and Fridays.  Recheck INR in 2 weeks. Call with any new medications or procedures Coumadin Clinic [phone] Main [phone]

## 2019-09-04 ENCOUNTER — Other Ambulatory Visit: Payer: Self-pay

## 2019-09-04 ENCOUNTER — Ambulatory Visit (HOSPITAL_COMMUNITY)
Admission: RE | Admit: 2019-09-04 | Discharge: 2019-09-04 | Disposition: A | Discharge status: Home / Self Care | Payer: Medicare Other | Source: Ambulatory Visit | Attending: Internal Medicine | Admitting: Internal Medicine

## 2019-09-04 DIAGNOSIS — I5022 Chronic systolic (congestive) heart failure: Secondary | ICD-10-CM | POA: Insufficient documentation

## 2019-09-04 LAB — LIPID PANEL
Cholesterol: 144 mg/dL (ref 0–200)
HDL: 27 mg/dL — ABNORMAL LOW (ref >40)
LDL Cholesterol: 73 mg/dL (ref 0–99)
Total CHOL/HDL Ratio: 5.3 RATIO
Triglycerides: 220 mg/dL — ABNORMAL HIGH (ref <150)
VLDL: 44 mg/dL — ABNORMAL HIGH (ref 0–40)

## 2019-09-04 LAB — BASIC METABOLIC PANEL
Anion gap: 8 (ref 5–15)
BUN: 12 mg/dL (ref 6–20)
CO2: 25 mmol/L (ref 22–32)
Calcium: 8.9 mg/dL (ref 8.9–10.3)
Chloride: 106 mmol/L (ref 98–111)
Creatinine, Ser: 1.06 mg/dL (ref 0.61–1.24)
GFR calc Af Amer: >60 mL/min (ref >60)
GFR calc non Af Amer: >60 mL/min (ref >60)
Glucose, Bld: 126 mg/dL — ABNORMAL HIGH (ref 70–99)
Potassium: 4.4 mmol/L (ref 3.5–5.1)
Sodium: 139 mmol/L (ref 135–145)

## 2019-09-06 ENCOUNTER — Telehealth (HOSPITAL_COMMUNITY): Payer: Self-pay | Admitting: *Deleted

## 2019-09-06 MED ORDER — FENOFIBRATE 145 MG PO TABS
145.0000 mg | ORAL_TABLET | Freq: Every day | ORAL | 6 refills | Status: DC
Start: 2019-09-06 — End: 2019-09-06

## 2019-09-06 MED ORDER — FENOFIBRATE 145 MG PO TABS: 145.0000 mg | ORAL_TABLET | Freq: Every day | ORAL | 6 refills | Status: DC

## 2019-09-06 MED FILL — FENOFIBRATE 145 MG TABS: 145 | 30 days supply | Qty: 30 | Fill #0

## 2019-09-06 NOTE — Addendum Note (Signed)
Addended by: Noralee Space on: 09/06/2019 10:46 AM   Modules accepted: Orders

## 2019-09-06 NOTE — Telephone Encounter (Signed)
Medicaid will not cover Vascepa, ok to change to fenofibrate per Dr Shirlee Latch, per Leotis Shames, pharm D dose should be 145 mg Daily. New rx sent in, attempted to call pt and Left message to call back

## 2019-09-08 ENCOUNTER — Encounter (HOSPITAL_COMMUNITY): Payer: Self-pay

## 2019-09-18 ENCOUNTER — Other Ambulatory Visit: Payer: Self-pay

## 2019-09-18 ENCOUNTER — Ambulatory Visit (INDEPENDENT_AMBULATORY_CARE_PROVIDER_SITE_OTHER): Payer: Medicare Other | Admitting: *Deleted

## 2019-09-18 DIAGNOSIS — Z9889 Other specified postprocedural states: Secondary | ICD-10-CM

## 2019-09-18 DIAGNOSIS — I340 Nonrheumatic mitral (valve) insufficiency: Secondary | ICD-10-CM | POA: Diagnosis not present

## 2019-09-18 DIAGNOSIS — Z5181 Encounter for therapeutic drug level monitoring: Secondary | ICD-10-CM

## 2019-09-18 DIAGNOSIS — D6862 Lupus anticoagulant syndrome: Secondary | ICD-10-CM

## 2019-09-18 LAB — POCT INR: INR: 2.3 (ref 2.0–3.0)

## 2019-09-18 NOTE — Patient Instructions (Addendum)
Description   Continue taking the dose you have been taking: 1 tablet daily except 1/2 tablet on Mondays, Wednesdays, and Fridays.  Recheck INR in 4 weeks. Call with any new medications or procedures Coumadin Clinic [phone] Main [phone]

## 2019-09-26 ENCOUNTER — Telehealth (HOSPITAL_COMMUNITY): Payer: Self-pay | Admitting: Cardiology

## 2019-09-26 MED FILL — FUROSEMIDE 40 MG TAB: 40 | 34 days supply | Qty: 34 | Fill #1

## 2019-09-26 MED FILL — SPIRONOLACTONE 25 MG TABS: 25 | 30 days supply | Qty: 30 | Fill #1

## 2019-09-26 MED FILL — ATORVASTATIN 80 MG TABLET: 80 | 30 days supply | Qty: 30 | Fill #1

## 2019-09-26 MED FILL — FARXIGA 10 MG TABLET: 10 | 30 days supply | Qty: 30 | Fill #4

## 2019-09-26 MED FILL — POTASSIUM CHLORIDE CRYS ER: 20 | 30 days supply | Qty: 30 | Fill #1

## 2019-09-26 NOTE — Telephone Encounter (Signed)
PA approved for vascepa-insurance will cover copay $9 Fenofibrate copay $4  Advised Dr Shirlee Latch would prefer the vascepa however if patient cannot afford copay ok to continue tricor Elizabeth,PharmD will discuss with patient at pick up and return call if something further is needed  Attempted to contact patient and discuss further no answer unable to leave message

## 2019-09-26 NOTE — Progress Notes (Signed)
error 

## 2019-09-28 ENCOUNTER — Other Ambulatory Visit: Payer: Self-pay | Admitting: Cardiology

## 2019-09-28 MED FILL — WARFARIN SODIUM 7.5 MG TAB: 7.5 | 90 days supply | Qty: 90 | Fill #0

## 2019-10-12 MED FILL — FENOFIBRATE 145 MG TABS: 145 | 30 days supply | Qty: 30 | Fill #1

## 2019-10-12 MED FILL — ENTRESTO 49 MG-51 MG TABLET: 49-51 | 30 days supply | Qty: 60 | Fill #1

## 2019-10-16 ENCOUNTER — Telehealth (HOSPITAL_COMMUNITY): Payer: Self-pay | Admitting: Cardiology

## 2019-10-16 NOTE — Telephone Encounter (Signed)
Called pt and advised.  

## 2019-10-16 NOTE — Telephone Encounter (Signed)
Pt wants to know if it's ok for him to take the covid vaccine because of health conditions, please call pt. Thanks

## 2019-10-18 ENCOUNTER — Ambulatory Visit (INDEPENDENT_AMBULATORY_CARE_PROVIDER_SITE_OTHER): Payer: Medicare Other | Admitting: Pharmacist

## 2019-10-18 ENCOUNTER — Other Ambulatory Visit: Payer: Self-pay

## 2019-10-18 DIAGNOSIS — Z9889 Other specified postprocedural states: Secondary | ICD-10-CM | POA: Diagnosis not present

## 2019-10-18 DIAGNOSIS — Z5181 Encounter for therapeutic drug level monitoring: Secondary | ICD-10-CM | POA: Diagnosis not present

## 2019-10-18 DIAGNOSIS — D6862 Lupus anticoagulant syndrome: Secondary | ICD-10-CM | POA: Diagnosis not present

## 2019-10-18 LAB — POCT INR: INR: 5.5 — AB (ref 2.0–3.0)

## 2019-10-18 NOTE — Patient Instructions (Signed)
Description   Skip your Coumadin today and tomorrow and eat some greens, then continue taking 1 tablet daily except 1/2 tablet on Mondays, Wednesdays, and Fridays.  Recheck INR in 2 weeks. Call with any new medications or procedures Coumadin Clinic [phone] Main [phone]

## 2019-10-23 ENCOUNTER — Other Ambulatory Visit (HOSPITAL_COMMUNITY): Payer: Self-pay

## 2019-10-23 DIAGNOSIS — E785 Hyperlipidemia, unspecified: Secondary | ICD-10-CM

## 2019-10-23 DIAGNOSIS — I5022 Chronic systolic (congestive) heart failure: Secondary | ICD-10-CM

## 2019-10-24 ENCOUNTER — Other Ambulatory Visit (HOSPITAL_COMMUNITY): Payer: Self-pay | Admitting: Cardiology

## 2019-10-24 ENCOUNTER — Other Ambulatory Visit: Payer: Self-pay

## 2019-10-24 ENCOUNTER — Ambulatory Visit (HOSPITAL_COMMUNITY)
Admission: RE | Admit: 2019-10-24 | Discharge: 2019-10-24 | Disposition: A | Discharge status: Home / Self Care | Payer: Medicare Other | Source: Ambulatory Visit | Attending: Cardiology | Admitting: Cardiology

## 2019-10-24 DIAGNOSIS — E785 Hyperlipidemia, unspecified: Secondary | ICD-10-CM | POA: Diagnosis not present

## 2019-10-24 DIAGNOSIS — I5022 Chronic systolic (congestive) heart failure: Secondary | ICD-10-CM | POA: Insufficient documentation

## 2019-10-24 LAB — HEPATIC FUNCTION PANEL
ALT: 30 U/L (ref 0–44)
AST: 25 U/L (ref 15–41)
Albumin: 4.2 g/dL (ref 3.5–5.0)
Alkaline Phosphatase: 60 U/L (ref 38–126)
Bilirubin, Direct: <0.1 mg/dL (ref 0.0–0.2)
Total Bilirubin: 0.7 mg/dL (ref 0.3–1.2)
Total Protein: 7.2 g/dL (ref 6.5–8.1)

## 2019-10-24 LAB — LIPID PANEL
Cholesterol: 138 mg/dL (ref 0–200)
HDL: 24 mg/dL — ABNORMAL LOW (ref >40)
LDL Cholesterol: 78 mg/dL (ref 0–99)
Total CHOL/HDL Ratio: 5.8 RATIO
Triglycerides: 178 mg/dL — ABNORMAL HIGH (ref <150)
VLDL: 36 mg/dL (ref 0–40)

## 2019-10-24 MED ORDER — ATORVASTATIN CALCIUM 80 MG PO TABS: 80.0000 mg | ORAL_TABLET | Freq: Every day | ORAL | 3 refills | Status: DC

## 2019-10-24 MED ORDER — POTASSIUM CHLORIDE CRYS ER 20 MEQ PO TBCR: 20.0000 meq | EXTENDED_RELEASE_TABLET | Freq: Every day | ORAL | 3 refills | Status: DC

## 2019-10-24 MED ORDER — WARFARIN SODIUM 7.5 MG PO TABS: ORAL_TABLET | ORAL | 0 refills | Status: DC

## 2019-10-24 MED ORDER — FUROSEMIDE 40 MG PO TABS: 40.0000 mg | ORAL_TABLET | Freq: Every day | ORAL | 3 refills | Status: DC

## 2019-10-24 MED ORDER — DAPAGLIFLOZIN PROPANEDIOL 10 MG PO TABS: 10.0000 mg | ORAL_TABLET | Freq: Every day | ORAL | 11 refills | Status: DC

## 2019-10-24 MED ORDER — ENTRESTO 49-51 MG PO TABS: 1.0000 | ORAL_TABLET | Freq: Two times a day (BID) | ORAL | 6 refills | Status: DC

## 2019-10-24 MED ORDER — SPIRONOLACTONE 25 MG PO TABS: ORAL_TABLET | ORAL | 3 refills | Status: DC

## 2019-10-24 MED ORDER — FENOFIBRATE 145 MG PO TABS: 145.0000 mg | ORAL_TABLET | Freq: Every day | ORAL | 6 refills | Status: DC

## 2019-10-24 MED ORDER — CARVEDILOL 12.5 MG PO TABS: 12.5000 mg | ORAL_TABLET | Freq: Two times a day (BID) | ORAL | 3 refills | Status: DC

## 2019-10-24 MED FILL — POTASSIUM CHLORIDE CRYS ER: 20 | 60 days supply | Qty: 60 | Fill #0

## 2019-10-24 MED FILL — CARVEDILOL 12.5 MG TABLET: 12.5 | 30 days supply | Qty: 60 | Fill #0

## 2019-10-24 MED FILL — SPIRONOLACTONE 25 MG TABS: 25 | 60 days supply | Qty: 60 | Fill #0

## 2019-10-24 MED FILL — FARXIGA 10 MG TABLET: 10 | 60 days supply | Qty: 60 | Fill #0

## 2019-10-24 MED FILL — ATORVASTATIN 80 MG TABLET: 80 | 60 days supply | Qty: 60 | Fill #0

## 2019-10-24 NOTE — Addendum Note (Signed)
Encounter addended by: Samara Snide, RN on: 10/24/2019 10:22 AM  Actions taken: Alternative orders not taken and original order placed, Order list changed, Diagnosis association updated

## 2019-10-27 MED FILL — FUROSEMIDE 40 MG TAB: 40 | 60 days supply | Qty: 60 | Fill #0

## 2019-10-30 ENCOUNTER — Telehealth (HOSPITAL_COMMUNITY): Payer: Self-pay | Admitting: Pharmacy Technician

## 2019-10-30 NOTE — Telephone Encounter (Signed)
Advanced Heart Failure Patient Advocate Encounter  Prior Authorization for Sherryll Burger has been approved.    Effective dates: 10/30/19 through 10/29/20  Archer Asa, CPhT

## 2019-10-30 NOTE — Telephone Encounter (Signed)
Patient Advocate Encounter   Received notification from [employer] that prior authorization for Sherryll Burger is required.   PA submitted on CoverMyMeds Key  [id number] Status is pending   Will continue to follow.

## 2019-11-03 ENCOUNTER — Other Ambulatory Visit: Payer: Self-pay

## 2019-11-03 ENCOUNTER — Ambulatory Visit (INDEPENDENT_AMBULATORY_CARE_PROVIDER_SITE_OTHER): Payer: Medicare Other | Admitting: *Deleted

## 2019-11-03 DIAGNOSIS — Z5181 Encounter for therapeutic drug level monitoring: Secondary | ICD-10-CM

## 2019-11-03 LAB — POCT INR: INR: 2.2 (ref 2.0–3.0)

## 2019-11-03 NOTE — Patient Instructions (Signed)
Description   Continue taking 1 tablet daily except 1/2 tablet on Mondays, Wednesdays, and Fridays.  Recheck INR in 3 weeks. Call with any new medications or procedures Coumadin Clinic [phone] Main [phone]

## 2019-11-06 MED FILL — FUROSEMIDE 40 MG TAB: 40 | 60 days supply | Qty: 60 | Fill #0

## 2019-11-08 MED FILL — FENOFIBRATE 145 MG TABS: 145 | 60 days supply | Qty: 60 | Fill #0

## 2019-11-09 ENCOUNTER — Ambulatory Visit (INDEPENDENT_AMBULATORY_CARE_PROVIDER_SITE_OTHER): Payer: Medicare Other

## 2019-11-09 DIAGNOSIS — I255 Ischemic cardiomyopathy: Secondary | ICD-10-CM | POA: Diagnosis not present

## 2019-11-09 LAB — CUP PACEART REMOTE DEVICE CHECK
Battery Remaining Longevity: 124 mo
Battery Voltage: 3.01 V
Brady Statistic RV Percent Paced: 0 %
Date Time Interrogation Session: 20211021043824
HighPow Impedance: 81 Ohm
Implantable Lead Implant Date: 20191031
Implantable Lead Location: 753860
Implantable Lead Model: 6935
Implantable Pulse Generator Implant Date: 20191031
Lead Channel Impedance Value: 361 Ohm
Lead Channel Impedance Value: 456 Ohm
Lead Channel Pacing Threshold Amplitude: 0.625 V
Lead Channel Pacing Threshold Pulse Width: 0.4 ms
Lead Channel Sensing Intrinsic Amplitude: 11.5 mV
Lead Channel Sensing Intrinsic Amplitude: 11.5 mV
Lead Channel Setting Pacing Amplitude: 2 V
Lead Channel Setting Pacing Pulse Width: 0.4 ms
Lead Channel Setting Sensing Sensitivity: 0.3 mV

## 2019-11-10 ENCOUNTER — Telehealth: Payer: Self-pay

## 2019-11-10 NOTE — Telephone Encounter (Signed)
Calling patient in regards to T Wave oversensing. Apt. Made for patient 11/14/19 @ 3:40.

## 2019-11-14 ENCOUNTER — Ambulatory Visit (INDEPENDENT_AMBULATORY_CARE_PROVIDER_SITE_OTHER): Payer: Medicare Other | Admitting: Emergency Medicine

## 2019-11-14 ENCOUNTER — Other Ambulatory Visit: Payer: Self-pay

## 2019-11-14 DIAGNOSIS — I255 Ischemic cardiomyopathy: Secondary | ICD-10-CM

## 2019-11-14 LAB — CUP PACEART INCLINIC DEVICE CHECK
Battery Remaining Longevity: 124 mo
Battery Voltage: 3.01 V
Brady Statistic RV Percent Paced: 0 %
Date Time Interrogation Session: 20211026154746
HighPow Impedance: 80 Ohm
Implantable Lead Implant Date: 20191031
Implantable Lead Location: 753860
Implantable Lead Model: 6935
Implantable Pulse Generator Implant Date: 20191031
Lead Channel Impedance Value: 399 Ohm
Lead Channel Impedance Value: 475 Ohm
Lead Channel Pacing Threshold Amplitude: 0.625 V
Lead Channel Pacing Threshold Pulse Width: 0.4 ms
Lead Channel Sensing Intrinsic Amplitude: 11.125 mV
Lead Channel Setting Pacing Amplitude: 2 V
Lead Channel Setting Pacing Pulse Width: 0.4 ms
Lead Channel Setting Sensing Sensitivity: 0.45 mV

## 2019-11-14 NOTE — Progress Notes (Signed)
Patient brought into device clinic for changes on ICD.   Changes made: RV sensitivity 0.45 mV See attached document

## 2019-11-14 NOTE — Progress Notes (Signed)
Remote ICD transmission.   

## 2019-11-16 MED FILL — ENTRESTO 49 MG-51 MG TABLET: 49-51 | 30 days supply | Qty: 60 | Fill #2

## 2019-11-24 ENCOUNTER — Other Ambulatory Visit: Payer: Self-pay

## 2019-11-24 ENCOUNTER — Ambulatory Visit (INDEPENDENT_AMBULATORY_CARE_PROVIDER_SITE_OTHER): Payer: Medicare Other | Admitting: *Deleted

## 2019-11-24 DIAGNOSIS — Z5181 Encounter for therapeutic drug level monitoring: Secondary | ICD-10-CM | POA: Diagnosis not present

## 2019-11-24 LAB — POCT INR: INR: 3.2 — AB (ref 2.0–3.0)

## 2019-11-24 NOTE — Patient Instructions (Signed)
Description   Hold today, then continue taking 1 tablet daily except 1/2 tablet on Mondays, Wednesdays, and Fridays.  Recheck INR in 3 weeks. Call with any new medications or procedures Coumadin Clinic [phone] Main [phone]

## 2019-12-18 ENCOUNTER — Ambulatory Visit (INDEPENDENT_AMBULATORY_CARE_PROVIDER_SITE_OTHER): Payer: Medicare Other

## 2019-12-18 ENCOUNTER — Other Ambulatory Visit: Payer: Self-pay

## 2019-12-18 DIAGNOSIS — D6862 Lupus anticoagulant syndrome: Secondary | ICD-10-CM

## 2019-12-18 DIAGNOSIS — I340 Nonrheumatic mitral (valve) insufficiency: Secondary | ICD-10-CM

## 2019-12-18 DIAGNOSIS — Z5181 Encounter for therapeutic drug level monitoring: Secondary | ICD-10-CM

## 2019-12-18 DIAGNOSIS — Z9889 Other specified postprocedural states: Secondary | ICD-10-CM

## 2019-12-18 LAB — POCT INR: INR: 3 (ref 2.0–3.0)

## 2019-12-18 MED FILL — ENTRESTO 49 MG-51 MG TABLET: 49-51 | 30 days supply | Qty: 60 | Fill #3

## 2019-12-18 NOTE — Patient Instructions (Signed)
Description   Take 1/2 tablet today and tomorrow, then resume same dosage 1 tablet daily except 1/2 tablet on Mondays, Wednesdays, and Fridays.  Recheck INR in 4 weeks. Call with any new medications or procedures Coumadin Clinic [phone] Main [phone]

## 2019-12-25 ENCOUNTER — Ambulatory Visit (HOSPITAL_COMMUNITY)
Admission: RE | Admit: 2019-12-25 | Discharge: 2019-12-25 | Disposition: A | Discharge status: Home / Self Care | Payer: Medicare Other | Source: Ambulatory Visit | Attending: Cardiology | Admitting: Cardiology

## 2019-12-25 ENCOUNTER — Other Ambulatory Visit: Payer: Self-pay

## 2019-12-25 ENCOUNTER — Other Ambulatory Visit (HOSPITAL_COMMUNITY): Payer: Self-pay | Admitting: Cardiology

## 2019-12-25 ENCOUNTER — Encounter (HOSPITAL_COMMUNITY): Payer: Self-pay

## 2019-12-25 VITALS — BP 128/83 | HR 88 | Wt 286.0 lb

## 2019-12-25 DIAGNOSIS — Z86711 Personal history of pulmonary embolism: Secondary | ICD-10-CM | POA: Insufficient documentation

## 2019-12-25 DIAGNOSIS — I5022 Chronic systolic (congestive) heart failure: Secondary | ICD-10-CM

## 2019-12-25 DIAGNOSIS — I255 Ischemic cardiomyopathy: Secondary | ICD-10-CM | POA: Diagnosis not present

## 2019-12-25 DIAGNOSIS — Z7901 Long term (current) use of anticoagulants: Secondary | ICD-10-CM | POA: Diagnosis not present

## 2019-12-25 DIAGNOSIS — I252 Old myocardial infarction: Secondary | ICD-10-CM | POA: Insufficient documentation

## 2019-12-25 DIAGNOSIS — Z955 Presence of coronary angioplasty implant and graft: Secondary | ICD-10-CM | POA: Diagnosis not present

## 2019-12-25 DIAGNOSIS — I340 Nonrheumatic mitral (valve) insufficiency: Secondary | ICD-10-CM | POA: Insufficient documentation

## 2019-12-25 DIAGNOSIS — Z79899 Other long term (current) drug therapy: Secondary | ICD-10-CM | POA: Insufficient documentation

## 2019-12-25 DIAGNOSIS — Z95810 Presence of automatic (implantable) cardiac defibrillator: Secondary | ICD-10-CM | POA: Insufficient documentation

## 2019-12-25 DIAGNOSIS — I2510 Atherosclerotic heart disease of native coronary artery without angina pectoris: Secondary | ICD-10-CM | POA: Diagnosis not present

## 2019-12-25 DIAGNOSIS — J449 Chronic obstructive pulmonary disease, unspecified: Secondary | ICD-10-CM | POA: Diagnosis not present

## 2019-12-25 DIAGNOSIS — Z8249 Family history of ischemic heart disease and other diseases of the circulatory system: Secondary | ICD-10-CM | POA: Insufficient documentation

## 2019-12-25 DIAGNOSIS — E785 Hyperlipidemia, unspecified: Secondary | ICD-10-CM | POA: Insufficient documentation

## 2019-12-25 DIAGNOSIS — Z87891 Personal history of nicotine dependence: Secondary | ICD-10-CM | POA: Insufficient documentation

## 2019-12-25 LAB — BASIC METABOLIC PANEL
Anion gap: 9 (ref 5–15)
BUN: 17 mg/dL (ref 6–20)
CO2: 21 mmol/L — ABNORMAL LOW (ref 22–32)
Calcium: 9.3 mg/dL (ref 8.9–10.3)
Chloride: 107 mmol/L (ref 98–111)
Creatinine, Ser: 1.23 mg/dL (ref 0.61–1.24)
GFR, Estimated: >60 mL/min (ref >60)
Glucose, Bld: 113 mg/dL — ABNORMAL HIGH (ref 70–99)
Potassium: 4.3 mmol/L (ref 3.5–5.1)
Sodium: 137 mmol/L (ref 135–145)

## 2019-12-25 MED ORDER — FUROSEMIDE 40 MG PO TABS: 40.0000 mg | ORAL_TABLET | ORAL | 3 refills | Status: DC

## 2019-12-25 MED ORDER — ENTRESTO 97-103 MG PO TABS: 1.0000 | ORAL_TABLET | Freq: Two times a day (BID) | ORAL | 3 refills | Status: DC

## 2019-12-25 MED ORDER — POTASSIUM CHLORIDE CRYS ER 20 MEQ PO TBCR: 20.0000 meq | EXTENDED_RELEASE_TABLET | ORAL | 3 refills | Status: DC

## 2019-12-25 MED FILL — POTASSIUM CHLORIDE CRYS ER: 20 | 84 days supply | Qty: 36 | Fill #0

## 2019-12-25 MED FILL — ENTRESTO 97 MG-103 MG TAB: 97-103 | 30 days supply | Qty: 60 | Fill #0

## 2019-12-25 NOTE — Patient Instructions (Addendum)
INCREASE Entresto 97/103mg  (1 tablet) Twice daily  DECREASE Lasix 40mg  Every Monday, Wednesday, and Friday  DECREASE Potassium Tuesday Every Monday, Wednesday, and Friday  Labs done today, your results will be available in MyChart, we will contact you for abnormal readings.  Your physician recommends that you schedule repeat labs in 7-10 days  Your physician recommends that you schedule a follow-up appointment in: 3-4 weeks with pharmacy  Your physician recommends that you schedule a follow-up appointment in: 3 months with Dr. 9-10  If you have any questions or concerns before your next appointment please send Shirlee Latch a message through [facility] or call our office at [phone].    TO LEAVE A MESSAGE FOR THE NURSE SELECT OPTION 2, PLEASE LEAVE A MESSAGE INCLUDING: . YOUR NAME . DATE OF BIRTH . CALL BACK NUMBER . REASON FOR CALL**this is important as we prioritize the call backs  YOU WILL RECEIVE A CALL BACK THE SAME DAY AS LONG AS YOU CALL BEFORE 4:00 PM

## 2019-12-25 NOTE — Progress Notes (Addendum)
PCP: None  HF Cardiology: Dr Shirlee Latch  CT Surgery: Dr Cornelius Moras  HPI: Joshua May is a 44 y.o. [place] male with a history of CAD dating back to 2007 when he had a PCI in [place]. In 2010 he presented with a STEMI and had an RCA DES placed. He followed up for a year but then didn't present again till Oct 2017 when he presented with a CFX infarct. Cath revealed 50% ISR of the RCA, total mCFX and 85% OM3. He had CFX PCI with DES and OM3 POBA. His EF then was 50-55%.  Admitted 02/27/17 with increased dyspnea and chest pain. CTA confirmed PE. ECHO completed and showed reduced EF 20-25%. Underwent LHC as noted below. [place] no evidence DVTs. CT surgery consulted for severe MR. Plan to optimize HF medications.Set up TEE as an outpatient. Discharge weight 222 pounds.   Admitted 06/2017 with scheduled MV repair. S/P Minimally invasive mitral valve repair on 6/19. ECHO 07/12/17 post surgery, no MR was noted but EF remained 25-30%. Tachycardic at the time of discharge. Discharge weight 257 pounds.   CPX was submaximal in 8/19, suggestive of marked deconditioning.  RHC in 9/19 showed relatively preserved cardiac index with only mildly elevated filling pressures.  Echo in 9/19 showed EF 30-35%. MDT ICD placed in 2019.   Had repeat echo 8/21 EF 45%, RV normal, s/p MV repair with mean gradient 5 and MVA 1.78 cm^2 (mild mitral stenosis), trivial MR.   He returns today for followup of CHF and CAD. Doing well. Denies CP. No significant dyspnea w/ basic ADLs but still gets mildly SOB walking up inclines. Denies orthopnea/PND. Wt stable and c/w baseline. Optivol fluid index and impedence suggest euvolemia. No AF/VT/VF on device interrogation. BP well controlled.   ECG: not performed today   Labs (7/19): K 4.5, creatinine 0.95, LDL 51 Labs (8/19): K 3.8, creatinine 0.99 Labs (5/21): K 4.8, creatinine 1.08, LDL 34, TGs 381 Labs (8/21): K 4.4, creatinine 1.06 Labs (10/21): LDL 78, TG 178   PMH: 1. CAD: Initial PCI in 2007 in  [place], inferior MI.   - Inferior STEMI 2010 with RCA PCI.  - CFx infarct in 2017 with LCx DES and OM3 POBA.  - LHC (2/19): Nonobstructive CAD.  2. 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  - TEE (4/19): LVEF 35-40%, Severe MR with PISA ERO 0.43 cm^2. Suspect infarct related MR with tethering of the posterior leaflet.  - Minimally invasive MV repair in 6/19.   3. Chronic systolic CHF: Ischemic cardiomyopathy. Medtronic ICD.  - Echo (2/19): EF 25-30%, diffuse HK with inferolateral AK, mildly decreased RV systolic function with D-shaped septum, moderate-severe infarct-related MR.  - Echo (6/19): EF 25-30%, apical and inferolateral hypokinesis, MV repair with mean gradient 9 mmHg with no MR, mildly decreased RV systolic function.  - CPX (8/19): VO2 13.2, VE/VCO2 slope 28, RER 0.87.  This study was suggestive of severe deconditioning.  - RHC (9/19): mean RA 9, PA 39/23 mean 24, mean PCWP 17, CI 2.2, PVR 1.35 WU - Echo (9/19): EF 30-35%, diffuse hypokinesis, normal RV, s/p MV repair with mean gradient 6 mmHg and PHT 104 msec => probably no significant stenosis.  - Echo (8/21): EF 45%, RV normal, s/p MV repair with mean gradient 5 and MVA 1.78 cm^2 (mild mitral stenosis), trivial MR.  4. Hyperlipidemia 5. PE: 2/19.  6. COPD: Moderate COPD on 6/19 PFTs.   Review of systems complete and found to be negative unless listed in HPI.    Social  History   Socioeconomic History  . Marital status: Married    Spouse name: Not on file  . Number of children: Not on file  . Years of education: Not on file  . Highest education level: Not on file  Occupational History  . Not on file  Tobacco Use  . Smoking status: Former Smoker    Packs/day: 0.50    Years: 17.00    Pack years: 8.50    Types: Cigarettes  . Smokeless tobacco: Never Used  . Tobacco comment: quit since heart attack in 10/2015  Vaping Use  . Vaping Use: Never used  Substance and Sexual Activity  . Alcohol use: Not Currently  . Drug use:  Not Currently    Types: Marijuana  . Sexual activity: Not on file  Other Topics Concern  . Not on file  Social History Narrative   The patient is married with two children.   Social Determinants of Health   Financial Resource Strain:   . Difficulty of Paying Living Expenses: Not on file  Food Insecurity:   . Worried About Programme researcher, broadcasting/film/video in the Last Year: Not on file  . Ran Out of Food in the Last Year: Not on file  Transportation Needs:   . Lack of Transportation (Medical): Not on file  . Lack of Transportation (Non-Medical): Not on file  Physical Activity:   . Days of Exercise per Week: Not on file  . Minutes of Exercise per Session: Not on file  Stress:   . Feeling of Stress : Not on file  Social Connections:   . Frequency of Communication with Friends and Family: Not on file  . Frequency of Social Gatherings with Friends and Family: Not on file  . Attends Religious Services: Not on file  . Active Member of Clubs or Organizations: Not on file  . Attends Banker Meetings: Not on file  . Marital Status: Not on file  Intimate Partner Violence:   . Fear of Current or Ex-Partner: Not on file  . Emotionally Abused: Not on file  . Physically Abused: Not on file  . Sexually Abused: Not on file   Family History  Problem Relation Age of Onset  . Heart attack Sister 5       S/P CABG  . Heart attack Father   . Deep vein thrombosis Father     Current Outpatient Medications  Medication Sig Dispense Refill  . albuterol (VENTOLIN HFA) 108 (90 Base) MCG/ACT inhaler Inhale 2 puffs into the lungs every 6 (six) hours as needed for wheezing or shortness of breath. 18 g 1  . atorvastatin (LIPITOR) 80 MG tablet Take 1 tablet (80 mg total) by mouth daily. Needs appt for further refills 60 tablet 3  . carvedilol (COREG) 12.5 MG tablet Take 1 tablet (12.5 mg total) by mouth 2 (two) times daily. 60 tablet 3  . dapagliflozin propanediol (FARXIGA) 10 MG TABS tablet Take 1  tablet (10 mg total) by mouth daily before breakfast. 60 tablet 11  . fenofibrate (TRICOR) 145 MG tablet Take 1 tablet (145 mg total) by mouth daily. 60 tablet 6  . furosemide (LASIX) 40 MG tablet Take 1 tablet (40 mg total) by mouth daily. 60 tablet 3  . potassium chloride SA (KLOR-CON) 20 MEQ tablet Take 1 tablet (20 mEq total) by mouth daily. 60 tablet 3  . sacubitril-valsartan (ENTRESTO) 49-51 MG Take 1 tablet by mouth 2 (two) times daily. 60 tablet 6  . spironolactone (ALDACTONE)  25 MG tablet TAKE 1 TABLET (25 MG TOTAL) BY MOUTH EVERY MORNING 60 tablet 3  . tiotropium (SPIRIVA HANDIHALER) 18 MCG inhalation capsule Place 1 capsule (18 mcg total) into inhaler and inhale daily. 30 capsule 12  . warfarin (COUMADIN) 7.5 MG tablet As directed by coumadin clinic 90 tablet 0   No current facility-administered medications for this encounter.   Vitals:   12/25/19 1008  BP: 128/83  Pulse: 88  SpO2: 97%  Weight: 129.7 kg (286 lb)    Wt Readings from Last 3 Encounters:  12/25/19 129.7 kg (286 lb)  08/23/19 127.7 kg (281 lb 8 oz)  06/07/19 128.8 kg (284 lb)    PHYSICAL EXAM: General:  Well appearing, moderately obses. No respiratory difficulty HEENT: normal Neck: supple. no JVD. Carotids 2+ bilat; no bruits. No lymphadenopathy or thyromegaly appreciated. Cor: PMI nondisplaced. Regular rate & rhythm. No rubs, gallops or murmurs. Lungs: clear Abdomen: soft, nontender, nondistended. No hepatosplenomegaly. No bruits or masses. Good bowel sounds. Extremities: no cyanosis, clubbing, rash, edema Neuro: alert & oriented x 3, cranial nerves grossly intact. moves all 4 extremities w/o difficulty. Affect pleasant.    ASSESSMENT & PLAN: 1. Chronic systolic CHF: Ischemic cardiomyopathy. Echo in 6/19 with EF 25-30%, echo 9/19 with EF 30-35%.  S/p Medtronic ICD.  Echo 8/21 showed EF up to 45%. Euvolemic on exam and by Optivol analysis. NYHA class II-III symptoms, suspect dyspnea more due to COPD than to  CHF.    - Continue spironolactone 25 mg daily.   - Continue Coreg 12.5 mg bid.  - Increase Entresto to 97-103 bid with BMET today and again 10 days.   - Reduce Lasix and KCl to MWF. He will monitor wt closely and if wt trends up, he will return to daily loop diuretic dosing.  - off digoxin w/ improving EF 2. CAD: Stents in LCx and RCA. LHC 02/2017 showed nonobstructive disease.  - stable, denies CP  - Continue atorvastatin, good lipids 10/21. - He is on warfarin so no ASA 81.  3. PE: In 2019, no definite trigger. Father with h/o VTE.  He should likely have long-term anticoagulation.   - He is on warfarin.  INR followed in Coumadin clinic, denies abnormal bleeding  4. Mitral regurgitation: Ischemic MR.  S/p minimally invasive MV repair in 6/19. Post-op echo 6/19 showed no MR but mean gradient 9 mmHg across MV. Echo (9/19) with mean MV gradient 6 mmHg but PHT short at 104 msec, probably no significant stenosis.  Echo today showed mean gradient 5 mmHg across repaired MV with MVA 1.78 cm^2.  Suspect mild mitral stenosis, only trivial MR.  5. COPD: I think this is a major source for dyspnea.   -  Spiriva daily was added to regimen by Dr. Shirlee Latch 8/10 but not using daily. Encouraged daily use to see if this helps him symptomatically.   6. Hyperlipidemia: Lipids good 10/21 - Continue statin + fenofibrate   F/u with pharmD in 3-4 weeks, f/u Dr. Shirlee Latch in 3 months.   Robbie Lis, PA-C 12/25/2019

## 2019-12-25 NOTE — Addendum Note (Signed)
Encounter addended by: Allayne Butcher, PA-C on: 12/25/2019 1:43 PM  Actions taken: Clinical Note Signed, Level of Service modified, Medication List reviewed, Problem List reviewed, Allergies reviewed, Flowsheet accepted

## 2019-12-26 MED FILL — ATORVASTATIN 80 MG TABLET: 80 | 60 days supply | Qty: 60 | Fill #1

## 2019-12-26 MED FILL — SPIRONOLACTONE 25 MG TABS: 25 | 60 days supply | Qty: 60 | Fill #1

## 2019-12-27 ENCOUNTER — Other Ambulatory Visit (HOSPITAL_COMMUNITY): Payer: Self-pay | Admitting: *Deleted

## 2019-12-27 ENCOUNTER — Other Ambulatory Visit (HOSPITAL_COMMUNITY): Payer: Self-pay | Admitting: Cardiology

## 2019-12-27 ENCOUNTER — Telehealth (HOSPITAL_COMMUNITY): Payer: Self-pay | Admitting: Pharmacy Technician

## 2019-12-27 MED ORDER — EMPAGLIFLOZIN 10 MG PO TABS: 10.0000 mg | ORAL_TABLET | Freq: Every day | ORAL | 3 refills | Status: DC

## 2019-12-27 MED FILL — JARDIANCE 10 MG TABLET: 10 | 90 days supply | Qty: 90 | Fill #0

## 2019-12-27 NOTE — Telephone Encounter (Signed)
Patient left a message regarding Joshua May. Pharmacy told him that it requires a PA. Upon investigation, his new plan ([employer]), prefers [place] over [place]. A 90 day supply would be $9.20.  Asked CMA to send Jardiance to his pharmacy and discontinue the [place]. Called and updated the patient. He understands that once he picks up the Jardiance that he will no longer be taking [place].  Advised him to call me with any issues.  Archer Asa, CPhT

## 2020-01-01 ENCOUNTER — Other Ambulatory Visit: Payer: Self-pay

## 2020-01-01 ENCOUNTER — Ambulatory Visit (HOSPITAL_COMMUNITY)
Admission: RE | Admit: 2020-01-01 | Discharge: 2020-01-01 | Disposition: A | Discharge status: Home / Self Care | Payer: Medicare Other | Source: Ambulatory Visit | Attending: Internal Medicine | Admitting: Internal Medicine

## 2020-01-01 DIAGNOSIS — I5022 Chronic systolic (congestive) heart failure: Secondary | ICD-10-CM

## 2020-01-01 LAB — BASIC METABOLIC PANEL
Anion gap: 11 (ref 5–15)
BUN: 15 mg/dL (ref 6–20)
CO2: 25 mmol/L (ref 22–32)
Calcium: 9.7 mg/dL (ref 8.9–10.3)
Chloride: 104 mmol/L (ref 98–111)
Creatinine, Ser: 1.3 mg/dL — ABNORMAL HIGH (ref 0.61–1.24)
GFR, Estimated: >60 mL/min (ref >60)
Glucose, Bld: 127 mg/dL — ABNORMAL HIGH (ref 70–99)
Potassium: 4.2 mmol/L (ref 3.5–5.1)
Sodium: 140 mmol/L (ref 135–145)

## 2020-01-01 MED FILL — FUROSEMIDE 40 MG TAB: 40 | 90 days supply | Qty: 36 | Fill #0

## 2020-01-08 MED FILL — FENOFIBRATE 145 MG TABS: 145 | 60 days supply | Qty: 60 | Fill #1

## 2020-01-08 MED FILL — CARVEDILOL 12.5 MG TABLET: 12.5 | 90 days supply | Qty: 180 | Fill #2

## 2020-01-16 ENCOUNTER — Inpatient Hospital Stay (HOSPITAL_COMMUNITY)
Admission: RE | Admit: 2020-01-16 | Discharge: 2020-01-16 | Disposition: A | Discharge status: Home / Self Care | Payer: Medicare Other | Source: Ambulatory Visit

## 2020-01-25 ENCOUNTER — Ambulatory Visit (INDEPENDENT_AMBULATORY_CARE_PROVIDER_SITE_OTHER): Payer: Medicare Other

## 2020-01-25 ENCOUNTER — Other Ambulatory Visit: Payer: Self-pay

## 2020-01-25 DIAGNOSIS — Z5181 Encounter for therapeutic drug level monitoring: Secondary | ICD-10-CM | POA: Diagnosis not present

## 2020-01-25 DIAGNOSIS — I340 Nonrheumatic mitral (valve) insufficiency: Secondary | ICD-10-CM

## 2020-01-25 DIAGNOSIS — D6862 Lupus anticoagulant syndrome: Secondary | ICD-10-CM | POA: Diagnosis not present

## 2020-01-25 DIAGNOSIS — Z9889 Other specified postprocedural states: Secondary | ICD-10-CM | POA: Diagnosis not present

## 2020-01-25 LAB — POCT INR: INR: 4.5 — AB (ref 2.0–3.0)

## 2020-01-25 NOTE — Patient Instructions (Signed)
Description   Skip today's dosage of Warfarin, then start taking 1/2 tablet daily except 1 tablet on Sundays, Tuesdays and Thursdays.  Recheck INR in 2 weeks. Call with any new medications or procedures [facility] [phone] Main [phone]

## 2020-02-06 MED FILL — ENTRESTO 97 MG-103 MG TAB: 97-103 | 30 days supply | Qty: 60 | Fill #1

## 2020-02-06 MED FILL — WARFARIN SODIUM 7.5 MG TAB: 7.5 | 90 days supply | Qty: 90 | Fill #0

## 2020-02-08 ENCOUNTER — Other Ambulatory Visit: Payer: Self-pay

## 2020-02-08 ENCOUNTER — Ambulatory Visit (INDEPENDENT_AMBULATORY_CARE_PROVIDER_SITE_OTHER): Payer: Medicare Other

## 2020-02-08 ENCOUNTER — Ambulatory Visit (INDEPENDENT_AMBULATORY_CARE_PROVIDER_SITE_OTHER): Payer: Medicare Other | Admitting: *Deleted

## 2020-02-08 DIAGNOSIS — I5022 Chronic systolic (congestive) heart failure: Secondary | ICD-10-CM | POA: Diagnosis not present

## 2020-02-08 DIAGNOSIS — I340 Nonrheumatic mitral (valve) insufficiency: Secondary | ICD-10-CM | POA: Diagnosis not present

## 2020-02-08 DIAGNOSIS — Z5181 Encounter for therapeutic drug level monitoring: Secondary | ICD-10-CM | POA: Diagnosis not present

## 2020-02-08 DIAGNOSIS — Z9889 Other specified postprocedural states: Secondary | ICD-10-CM

## 2020-02-08 DIAGNOSIS — D6862 Lupus anticoagulant syndrome: Secondary | ICD-10-CM

## 2020-02-08 LAB — CUP PACEART REMOTE DEVICE CHECK
Battery Remaining Longevity: 122 mo
Battery Voltage: 3.02 V
Brady Statistic RV Percent Paced: 0.03 %
Date Time Interrogation Session: 20220120012403
HighPow Impedance: 76 Ohm
Implantable Lead Implant Date: 20191031
Implantable Lead Location: 753860
Implantable Lead Model: 6935
Implantable Pulse Generator Implant Date: 20191031
Lead Channel Impedance Value: 361 Ohm
Lead Channel Impedance Value: 456 Ohm
Lead Channel Pacing Threshold Amplitude: 0.625 V
Lead Channel Pacing Threshold Pulse Width: 0.4 ms
Lead Channel Sensing Intrinsic Amplitude: 10.875 mV
Lead Channel Sensing Intrinsic Amplitude: 10.875 mV
Lead Channel Setting Pacing Amplitude: 2 V
Lead Channel Setting Pacing Pulse Width: 0.4 ms
Lead Channel Setting Sensing Sensitivity: 0.45 mV

## 2020-02-08 LAB — POCT INR: INR: 2.7 (ref 2.0–3.0)

## 2020-02-08 NOTE — Patient Instructions (Signed)
Description   Continue taking 1/2 tablet daily except 1 tablet on Sundays, Tuesdays and Thursdays.  Recheck INR in 3 weeks. Call with any new medications or procedures [facility] [phone] Main [phone]

## 2020-02-15 ENCOUNTER — Telehealth (HOSPITAL_COMMUNITY): Payer: Self-pay | Admitting: Pharmacy Technician

## 2020-02-15 NOTE — Telephone Encounter (Signed)
It's time to re-enroll patient to receive medication assistance for Entresto from [employer]. Patient currently has active insurance and paid $9.85 for a 30 day supply. No assistance needed at this time.  Archer Asa, CPhT

## 2020-02-20 NOTE — Progress Notes (Signed)
Remote ICD transmission.   

## 2020-02-21 ENCOUNTER — Ambulatory Visit: Payer: Medicare Other | Admitting: Internal Medicine

## 2020-02-26 MED FILL — POTASSIUM CHLORIDE CRYS ER: 20 | 84 days supply | Qty: 36 | Fill #1

## 2020-02-26 MED FILL — SPIRONOLACTONE 25 MG TABS: 25 | 60 days supply | Qty: 60 | Fill #2

## 2020-02-26 MED FILL — ATORVASTATIN 80 MG TABLET: 80 | 60 days supply | Qty: 60 | Fill #2

## 2020-02-29 ENCOUNTER — Other Ambulatory Visit: Payer: Self-pay

## 2020-02-29 ENCOUNTER — Ambulatory Visit (INDEPENDENT_AMBULATORY_CARE_PROVIDER_SITE_OTHER): Payer: Medicare Other | Admitting: *Deleted

## 2020-02-29 DIAGNOSIS — Z5181 Encounter for therapeutic drug level monitoring: Secondary | ICD-10-CM | POA: Diagnosis not present

## 2020-02-29 DIAGNOSIS — I340 Nonrheumatic mitral (valve) insufficiency: Secondary | ICD-10-CM

## 2020-02-29 DIAGNOSIS — D6862 Lupus anticoagulant syndrome: Secondary | ICD-10-CM | POA: Diagnosis not present

## 2020-02-29 DIAGNOSIS — Z9889 Other specified postprocedural states: Secondary | ICD-10-CM

## 2020-02-29 LAB — POCT INR: INR: 3.1 — AB (ref 2.0–3.0)

## 2020-02-29 NOTE — Patient Instructions (Signed)
Description   Today take 1/2 tablet then continue taking 1/2 tablet daily except 1 tablet on Sundays, Tuesdays, and Thursdays. Recheck INR in 3 weeks. Call with any new medications or procedures [facility] [phone] Main [phone]

## 2020-03-11 MED FILL — FENOFIBRATE 145 MG TABS: 145 | 60 days supply | Qty: 60 | Fill #2

## 2020-03-14 ENCOUNTER — Other Ambulatory Visit (HOSPITAL_COMMUNITY): Payer: Self-pay | Admitting: Cardiology

## 2020-03-14 ENCOUNTER — Telehealth (HOSPITAL_COMMUNITY): Payer: Self-pay | Admitting: Pharmacy Technician

## 2020-03-14 ENCOUNTER — Other Ambulatory Visit (HOSPITAL_COMMUNITY): Payer: Self-pay | Admitting: *Deleted

## 2020-03-14 DIAGNOSIS — I5022 Chronic systolic (congestive) heart failure: Secondary | ICD-10-CM

## 2020-03-14 MED ORDER — CARVEDILOL 12.5 MG PO TABS: 12.5000 mg | ORAL_TABLET | Freq: Two times a day (BID) | ORAL | 3 refills | Status: DC

## 2020-03-14 MED ORDER — ENTRESTO 97-103 MG PO TABS: 1.0000 | ORAL_TABLET | Freq: Two times a day (BID) | ORAL | 3 refills | Status: DC

## 2020-03-14 MED ORDER — SPIRONOLACTONE 25 MG PO TABS: ORAL_TABLET | ORAL | 3 refills | Status: DC

## 2020-03-14 MED ORDER — ATORVASTATIN CALCIUM 80 MG PO TABS: 80.0000 mg | ORAL_TABLET | Freq: Every day | ORAL | 3 refills | Status: DC

## 2020-03-14 MED ORDER — POTASSIUM CHLORIDE CRYS ER 20 MEQ PO TBCR: 20.0000 meq | EXTENDED_RELEASE_TABLET | ORAL | 3 refills | Status: DC

## 2020-03-14 MED ORDER — EMPAGLIFLOZIN 10 MG PO TABS: 10.0000 mg | ORAL_TABLET | Freq: Every day | ORAL | 3 refills | Status: DC

## 2020-03-14 MED ORDER — FUROSEMIDE 40 MG PO TABS: 40.0000 mg | ORAL_TABLET | ORAL | 3 refills | Status: DC

## 2020-03-14 MED FILL — ENTRESTO 97 MG-103 MG TAB: 97-103 | 90 days supply | Qty: 180 | Fill #0

## 2020-03-14 NOTE — Telephone Encounter (Addendum)
Advanced Heart Failure Patient Advocate Encounter  Prior Authorization for Sherryll Burger has been submitted and approved.    PA# [id number]  Effective dates: 03/14/20 through 03/14/21  Patients co-pay is $9.85 (90 days)  The patient's 90 day co-pay is the same as his 30 day. Asked Jasmine, (CMA) to send in 90 day RXs for the future. Called and spoke with the patient.  Archer Asa, CPhT

## 2020-03-21 ENCOUNTER — Other Ambulatory Visit: Payer: Self-pay

## 2020-03-21 ENCOUNTER — Ambulatory Visit (INDEPENDENT_AMBULATORY_CARE_PROVIDER_SITE_OTHER): Payer: Medicare Other | Admitting: *Deleted

## 2020-03-21 DIAGNOSIS — I340 Nonrheumatic mitral (valve) insufficiency: Secondary | ICD-10-CM | POA: Diagnosis not present

## 2020-03-21 DIAGNOSIS — Z9889 Other specified postprocedural states: Secondary | ICD-10-CM | POA: Diagnosis not present

## 2020-03-21 DIAGNOSIS — Z5181 Encounter for therapeutic drug level monitoring: Secondary | ICD-10-CM

## 2020-03-21 DIAGNOSIS — D6862 Lupus anticoagulant syndrome: Secondary | ICD-10-CM

## 2020-03-21 LAB — POCT INR: INR: 4.6 — AB (ref 2.0–3.0)

## 2020-03-21 NOTE — Patient Instructions (Signed)
Description   Do not take any Warfarin today and No Warfarin tomorrow then start taking 1/2 tablet daily except 1 tablet on Sundays and Thursdays. Recheck INR in 2 weeks. Call with any new medications or procedures [facility] [phone] Main [phone]

## 2020-03-25 MED FILL — FUROSEMIDE 40 MG TAB: 40 | 90 days supply | Qty: 36 | Fill #1

## 2020-03-25 MED FILL — JARDIANCE 10 MG TABLET: 10 | 90 days supply | Qty: 90 | Fill #0

## 2020-03-26 ENCOUNTER — Telehealth (HOSPITAL_COMMUNITY): Payer: Self-pay

## 2020-03-26 ENCOUNTER — Other Ambulatory Visit (HOSPITAL_COMMUNITY): Payer: Self-pay | Admitting: Cardiology

## 2020-03-26 ENCOUNTER — Encounter (HOSPITAL_COMMUNITY): Payer: Self-pay | Admitting: Cardiology

## 2020-03-26 ENCOUNTER — Ambulatory Visit (HOSPITAL_COMMUNITY)
Admission: RE | Admit: 2020-03-26 | Discharge: 2020-03-26 | Disposition: A | Discharge status: Home / Self Care | Payer: Medicare Other | Source: Ambulatory Visit | Attending: Cardiology | Admitting: Cardiology

## 2020-03-26 ENCOUNTER — Other Ambulatory Visit: Payer: Self-pay

## 2020-03-26 VITALS — BP 102/60 | HR 80 | Wt 282.2 lb

## 2020-03-26 DIAGNOSIS — J449 Chronic obstructive pulmonary disease, unspecified: Secondary | ICD-10-CM | POA: Diagnosis not present

## 2020-03-26 DIAGNOSIS — I340 Nonrheumatic mitral (valve) insufficiency: Secondary | ICD-10-CM | POA: Diagnosis not present

## 2020-03-26 DIAGNOSIS — Z87891 Personal history of nicotine dependence: Secondary | ICD-10-CM | POA: Insufficient documentation

## 2020-03-26 DIAGNOSIS — I2510 Atherosclerotic heart disease of native coronary artery without angina pectoris: Secondary | ICD-10-CM | POA: Diagnosis not present

## 2020-03-26 DIAGNOSIS — I252 Old myocardial infarction: Secondary | ICD-10-CM | POA: Diagnosis not present

## 2020-03-26 DIAGNOSIS — Z95810 Presence of automatic (implantable) cardiac defibrillator: Secondary | ICD-10-CM | POA: Insufficient documentation

## 2020-03-26 DIAGNOSIS — E785 Hyperlipidemia, unspecified: Secondary | ICD-10-CM | POA: Insufficient documentation

## 2020-03-26 DIAGNOSIS — I255 Ischemic cardiomyopathy: Secondary | ICD-10-CM | POA: Diagnosis not present

## 2020-03-26 DIAGNOSIS — Z955 Presence of coronary angioplasty implant and graft: Secondary | ICD-10-CM | POA: Diagnosis not present

## 2020-03-26 DIAGNOSIS — Z79899 Other long term (current) drug therapy: Secondary | ICD-10-CM | POA: Insufficient documentation

## 2020-03-26 DIAGNOSIS — G4733 Obstructive sleep apnea (adult) (pediatric): Secondary | ICD-10-CM | POA: Diagnosis not present

## 2020-03-26 DIAGNOSIS — I5022 Chronic systolic (congestive) heart failure: Secondary | ICD-10-CM

## 2020-03-26 DIAGNOSIS — Z7901 Long term (current) use of anticoagulants: Secondary | ICD-10-CM | POA: Insufficient documentation

## 2020-03-26 DIAGNOSIS — Z8249 Family history of ischemic heart disease and other diseases of the circulatory system: Secondary | ICD-10-CM | POA: Insufficient documentation

## 2020-03-26 LAB — CBC
HCT: 44.3 % (ref 39.0–52.0)
Hemoglobin: 13.7 g/dL (ref 13.0–17.0)
MCH: 30.6 pg (ref 26.0–34.0)
MCHC: 30.9 g/dL (ref 30.0–36.0)
MCV: 98.9 fL (ref 80.0–100.0)
Platelets: 292 10*3/uL (ref 150–400)
RBC: 4.48 MIL/uL (ref 4.22–5.81)
RDW: 12.5 % (ref 11.5–15.5)
WBC: 7.3 10*3/uL (ref 4.0–10.5)
nRBC: 0 % (ref 0.0–0.2)

## 2020-03-26 LAB — BASIC METABOLIC PANEL
Anion gap: 9 (ref 5–15)
BUN: 16 mg/dL (ref 6–20)
CO2: 25 mmol/L (ref 22–32)
Calcium: 8.9 mg/dL (ref 8.9–10.3)
Chloride: 103 mmol/L (ref 98–111)
Creatinine, Ser: 1.12 mg/dL (ref 0.61–1.24)
GFR, Estimated: >60 mL/min (ref >60)
Glucose, Bld: 87 mg/dL (ref 70–99)
Potassium: 3.7 mmol/L (ref 3.5–5.1)
Sodium: 137 mmol/L (ref 135–145)

## 2020-03-26 LAB — PROTIME-INR
INR: 1.5 — ABNORMAL HIGH (ref 0.8–1.2)
Prothrombin Time: 17.7 seconds — ABNORMAL HIGH (ref 11.4–15.2)

## 2020-03-26 MED ORDER — APIXABAN 5 MG PO TABS: 5.0000 mg | ORAL_TABLET | Freq: Two times a day (BID) | ORAL | 3 refills | Status: DC

## 2020-03-26 MED FILL — ELIQUIS 5 MG TABLET: 5 | 90 days supply | Qty: 180 | Fill #0

## 2020-03-26 NOTE — Telephone Encounter (Signed)
-----   Message from Laurey Morale, MD sent at 03/26/2020  2:59 PM EST ----- Labs ok.  Starting Eliquis and stopping warfarin, timing per pharmacy based on INR 1.5 today

## 2020-03-26 NOTE — Telephone Encounter (Signed)
Patient advised and verbalized understanding. Per Karle Plumber " his INR is 1.5, so he can go ahead and STOP warfarin and start Eliquis 5 mg BID this afternoon. Would recommend sending a 90 day supply of the Eliquis since its $9.85 and letting his coumadin clinic know he is no longer on warfarin"   Per patient he will not be able to pick up the Eliquis today per Karle Plumber, Pharmacist have him take his normal warfarin dose tonight, then dont start Eliquis until tomorrow evening   Meds ordered this encounter  Medications  . apixaban (ELIQUIS) 5 MG TABS tablet    Sig: Take 1 tablet (5 mg total) by mouth 2 (two) times daily.    Dispense:  180 tablet    Refill:  3    D/C warfarin

## 2020-03-26 NOTE — Progress Notes (Signed)
PCP: None  HF Cardiology: Dr Shirlee Latch  CT Surgery: Dr Cornelius Moras  HPI: Joshua May is a 45 y.o. [place] male with a history of CAD dating back to 2007 when he had a PCI in [place]. In 2010 he presented with a STEMI and had an RCA DES placed. He followed up for a year but then didn't present again till Oct 2017 when he presented with a CFX infarct. Cath revealed 50% ISR of the RCA, total mCFX and 85% OM3. He had CFX PCI with DES and OM3 POBA. His EF then was 50-55%.  Admitted 02/27/17 with increased dyspnea and chest pain. CTA confirmed PE. ECHO completed and showed reduced EF 20-25%. Underwent LHC as noted below. [place] no evidence DVTs. CT surgery consulted for severe MR. Plan to optimize HF medications.Set up TEE as an outpatient. Discharge weight 222 pounds.   Admitted 06/2017 with scheduled MV repair. S/P Minimally invasive mitral valve repair on 6/19. ECHO 07/12/17 post surgery, no MR was noted but EF remained 25-30%. Tachycardic at the time of discharge. Discharge weight 257 pounds.   CPX was submaximal in 8/19, suggestive of marked deconditioning.  RHC in 9/19 showed relatively preserved cardiac index with only mildly elevated filling pressures.  Echo in 9/19 showed EF 30-35%. MDT ICD placed in 2019.   Echo in 8/21 showed EF 45%,RV normal, s/p MV repair with mean gradient 5 and MVA 1.78 cm^2 (mild mitral stenosis), trivial MR.   He returns today for followup of CHF and CAD.  Weight is down 4 lbs.  He is short of breath walking up stairs or if he walks fast.  No chest pain.  Rare lightheadedness with standing.  He snores and gasps at night, he is sleepy during the day.    ECG (personally reviewed): NSR, old inferior MI, anterolateral TWIs  Medtronic device interrogation: Stable thoracic impedance, no AF/VT.   Labs (7/19): K 4.5, creatinine 0.95, LDL 51 Labs (8/19): K 3.8, creatinine 0.99 Labs (5/21): K 4.8, creatinine 1.08, LDL 34, TGs 381 Labs (10/21): LDL 78, HDL 74, TGs 178 Labs (12/21): K 4.2,  creatinine 1.3  PMH: 1. CAD: Initial PCI in 2007 in [place], inferior MI.   - Inferior STEMI 2010 with RCA PCI.  - CFx infarct in 2017 with LCx DES and OM3 POBA.  - LHC (2/19): Nonobstructive CAD.  2. 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  - TEE (4/19): LVEF 35-40%, Severe MR with PISA ERO 0.43 cm^2. Suspect infarct related MR with tethering of the posterior leaflet.  - Minimally invasive MV repair in 6/19.   3. Chronic systolic CHF: Ischemic cardiomyopathy. Medtronic ICD.  - Echo (2/19): EF 25-30%, diffuse HK with inferolateral AK, mildly decreased RV systolic function with D-shaped septum, moderate-severe infarct-related MR.  - Echo (6/19): EF 25-30%, apical and inferolateral hypokinesis, MV repair with mean gradient 9 mmHg with no MR, mildly decreased RV systolic function.  - CPX (8/19): VO2 13.2, VE/VCO2 slope 28, RER 0.87.  This study was suggestive of severe deconditioning.  - RHC (9/19): mean RA 9, PA 39/23 mean 24, mean PCWP 17, CI 2.2, PVR 1.35 WU - Echo (9/19): EF 30-35%, diffuse hypokinesis, normal RV, s/p MV repair with mean gradient 6 mmHg and PHT 104 msec => probably no significant stenosis.  - Echo (8/21): EF 45%, RV normal, s/p MV repair with mean gradient 5 and MVA 1.78 cm^2 (mild mitral stenosis), trivial MR.  4. Hyperlipidemia 5. PE: 2/19.  6. COPD: Moderate COPD on 6/19 PFTs.  Review of systems complete and found to be negative unless listed in HPI.    Social History   Socioeconomic History  . Marital status: Married    Spouse name: Not on file  . Number of children: Not on file  . Years of education: Not on file  . Highest education level: Not on file  Occupational History  . Not on file  Tobacco Use  . Smoking status: Former Smoker    Packs/day: 0.50    Years: 17.00    Pack years: 8.50    Types: Cigarettes  . Smokeless tobacco: Never Used  . Tobacco comment: quit since heart attack in 10/2015  Vaping Use  . Vaping Use: Never used  Substance and Sexual  Activity  . Alcohol use: Not Currently  . Drug use: Not Currently    Types: Marijuana  . Sexual activity: Not on file  Other Topics Concern  . Not on file  Social History Narrative   The patient is married with two children.   Social Determinants of Health   Financial Resource Strain: Not on file  Food Insecurity: Not on file  Transportation Needs: Not on file  Physical Activity: Not on file  Stress: Not on file  Social Connections: Not on file  Intimate Partner Violence: Not on file   Family History  Problem Relation Age of Onset  . Heart attack Sister 80       S/P CABG  . Heart attack Father   . Deep vein thrombosis Father     Current Outpatient Medications  Medication Sig Dispense Refill  . albuterol (VENTOLIN HFA) 108 (90 Base) MCG/ACT inhaler Inhale 2 puffs into the lungs every 6 (six) hours as needed for wheezing or shortness of breath. 18 g 1  . atorvastatin (LIPITOR) 80 MG tablet Take 1 tablet (80 mg total) by mouth daily. 180 tablet 3  . carvedilol (COREG) 12.5 MG tablet Take 1 tablet (12.5 mg total) by mouth 2 (two) times daily. 180 tablet 3  . empagliflozin (JARDIANCE) 10 MG TABS tablet Take 1 tablet (10 mg total) by mouth daily before breakfast. 90 tablet 3  . fenofibrate (TRICOR) 145 MG tablet Take 1 tablet (145 mg total) by mouth daily. 60 tablet 6  . furosemide (LASIX) 40 MG tablet Take 1 tablet (40 mg total) by mouth as directed. Every Monday, Wednesday, Friday 60 tablet 3  . potassium chloride SA (KLOR-CON) 20 MEQ tablet Take 1 tablet (20 mEq total) by mouth as directed. Every Monday, Wednesday, Friday 60 tablet 3  . sacubitril-valsartan (ENTRESTO) 97-103 MG Take 1 tablet by mouth 2 (two) times daily. 180 tablet 3  . spironolactone (ALDACTONE) 25 MG tablet TAKE 1 TABLET (25 MG TOTAL) BY MOUTH EVERY MORNING 90 tablet 3  . tiotropium (SPIRIVA HANDIHALER) 18 MCG inhalation capsule Place 1 capsule (18 mcg total) into inhaler and inhale daily. 30 capsule 12  .  apixaban (ELIQUIS) 5 MG TABS tablet Take 1 tablet (5 mg total) by mouth 2 (two) times daily. 180 tablet 3   No current facility-administered medications for this encounter.   Vitals:   03/26/20 1132  BP: 102/60  Pulse: 80  SpO2: 98%  Weight: 128 kg (282 lb 3.2 oz)    Wt Readings from Last 3 Encounters:  03/26/20 128 kg (282 lb 3.2 oz)  12/25/19 129.7 kg (286 lb)  08/23/19 127.7 kg (281 lb 8 oz)    PHYSICAL EXAM: General: NAD Neck: Thick. No JVD, no thyromegaly or thyroid nodule.  Lungs: Clear to auscultation bilaterally with normal respiratory effort. CV: Nondisplaced PMI.  Heart regular S1/S2, no S3/S4, no murmur.  No peripheral edema.  No carotid bruit.  Normal pedal pulses.  Abdomen: Soft, nontender, no hepatosplenomegaly, no distention.  Skin: Intact without lesions or rashes.  Neurologic: Alert and oriented x 3.  Psych: Normal affect. Extremities: No clubbing or cyanosis.  HEENT: Normal.   ASSESSMENT & PLAN: 1. Chronic systolic CHF: Ischemic cardiomyopathy. Echo in 6/19 with EF 25-30%, echo 9/19 with EF 30-35%.  Medtronic ICD.  Echo in 8/21 showed EF up to 45%.  He is not volume overloaded on exam or by Optivol. NYHA class II symptoms, suspect symptoms are more due to COPD than to CHF.  BP 102/60, does not have much room to titrate meds.  - Continue spironolactone 25 mg daily.   - Continue Coreg 12.5 mg bid.  - Continue Entresto 49/51 bid.   - Continue Lasix 40 mg tiw.  BMET today.  - Continue empagliflozin 10 mg daily.  2. CAD: Stents in LCx and RCA. LHC 02/2017 showed nonobstructive disease. No chest pain.   - Continue atorvastatin, acceptable lipids 10/21. - He is on warfarin so no ASA 81.  3. PE: In 2019, no definite trigger. Father with h/o VTE.  He should likely have long-term anticoagulation.   - He has been on warfarin, INR has been up and down.  I will transition him to Eliquis.  4. Mitral regurgitation: Ischemic MR.  S/p minimally invasive MV repair in 6/19.  Post-op echo 6/19 showed no MR but mean gradient 9 mmHg across MV. Echo (9/19) with mean MV gradient 6 mmHg but PHT short at 104 msec, probably no significant stenosis.  Echo in 8/21 showed mean gradient 5 mmHg across repaired MV with MVA 1.78 cm^2.  Suspect mild mitral stenosis, only trivial MR.  5. COPD: I think this is a major source for dyspnea.   6. Hyperlipidemia: Lipids acceptable in 10/21.  - Continue statin.  - Continue fenofibrate (was unable to get Vascepa).  7. OSA: Strongly suspect.   - Home sleep study.    Followup in 4 months.   Marca Ancona 03/26/2020

## 2020-03-26 NOTE — Patient Instructions (Addendum)
Labs done today. We will contact you only if your labs are abnormal.  We will be in contact with you regarding changing from Warfarin to Eliquis  No other medication changes were made. Please continue all current medications as prescribed.  Your physician recommends that you schedule a follow-up appointment in: 4 months. Please contact our office in June to schedule a July appointment.  If you have any questions or concerns before your next appointment please send [place] a message through [place] or call our office at [phone].    TO LEAVE A MESSAGE FOR THE NURSE SELECT OPTION 2, PLEASE LEAVE A MESSAGE INCLUDING: . YOUR NAME . DATE OF BIRTH . CALL BACK NUMBER . REASON FOR CALL**this is important as we prioritize the call backs  YOU WILL RECEIVE A CALL BACK THE SAME DAY AS LONG AS YOU CALL BEFORE 4:00 PM   Do the following things EVERYDAY: 1) Weigh yourself in the morning before breakfast. Write it down and keep it in a log. 2) Take your medicines as prescribed 3) Eat low salt foods--Limit salt (sodium) to 2000 mg per day.  4) Stay as active as you can everyday 5) Limit all fluids for the day to less than 2 liters   At the [facility], you and your health needs are our priority. As part of our continuing mission to provide you with exceptional heart care, we have created designated Provider Care Teams. These Care Teams include your primary Cardiologist (physician) and Advanced Practice Providers (APPs- Physician Assistants and Nurse Practitioners) who all work together to provide you with the care you need, when you need it.   You may see any of the following providers on your designated Care Team at your next follow up: Marland Kitchen Dr Arvilla Meres . Dr Marca Ancona . Tonye Becket, NP . Robbie Lis, PA . Karle Plumber, PharmD   Please be sure to bring in all your medications bottles to every appointment.

## 2020-03-26 NOTE — Progress Notes (Signed)
Patient Name: Jeremyah Rowton        DOB: Sep 09, 1975      Height: 5'11    Weight:282.2lbs  Office Name: [facility]         Referring Provider: Dr. Marca Ancona  Today's Date: 03/26/20  Date: 03/26/20   STOP BANG RISK ASSESSMENT S (snore) Have you been told that you snore?     YES   T (tired) Are you often tired, fatigued, or sleepy during the day?   YES  O (obstruction) Do you stop breathing, choke, or gasp during sleep? YES   P (pressure) Do you have or are you being treated for high blood pressure? NO   B (BMI) Is your body index greater than 35 kg/m? YES   A (age) Are you 99 years old or older? NO   N (neck) Do you have a neck circumference greater than 16 inches?   YES/NO   G (gender) Are you a male? YES   TOTAL STOP/BANG "YES" ANSWERS 5                                                                       For Office Use Only              Procedure Order Form    YES to 3+ Stop Bang questions OR two clinical symptoms - patient qualifies for WatchPAT (CPT 95800)             Clinical Notes: Will consult Sleep Specialist and refer for management of therapy due to patient increased risk of Sleep Apnea. Ordering a sleep study due to the following two clinical symptoms: Excessive daytime sleepiness G47.10  / Loud snoring R06.83  I understand that I am proceeding with a home sleep apnea test as ordered by my treating physician. I understand that untreated sleep apnea is a serious cardiovascular risk factor and it is my responsibility to perform the test and seek management for sleep apnea. I will be contacted with the results and be managed for sleep apnea by a local sleep physician. I will be receiving equipment and further instructions from [facility]. I shall promptly ship back the equipment via the included mailing label. I understand my insurance will be billed for the test and as the patient I am responsible for any insurance related out-of-pocket costs  incurred. I have been provided with written instructions and can call for additional video or telephonic instruction, with 24-hour availability of qualified personnel to answer any questions: Patient Help Desk [phone].  Patient Signature ______________________________________________________   Date______________________ Patient Telemedicine Verbal Consent   '

## 2020-04-01 ENCOUNTER — Telehealth (HOSPITAL_COMMUNITY): Payer: Self-pay | Admitting: Surgery

## 2020-04-01 NOTE — Telephone Encounter (Signed)
I called and spoke with Joshua May regarding his ordered sleep study.  I let him know that Insurance prior approval is not required and he can perform the test tonite.  He understands and acknowledges.

## 2020-04-05 ENCOUNTER — Telehealth (HOSPITAL_COMMUNITY): Payer: Self-pay | Admitting: Surgery

## 2020-04-05 NOTE — Telephone Encounter (Signed)
I attempted to reach [name] regarding his ordered home sleep study.  It has not yet been completed or uploaded.  I was unable to leave a message.

## 2020-04-06 ENCOUNTER — Encounter (HOSPITAL_BASED_OUTPATIENT_CLINIC_OR_DEPARTMENT_OTHER): Payer: Medicare Other | Admitting: Cardiology

## 2020-04-06 DIAGNOSIS — G4733 Obstructive sleep apnea (adult) (pediatric): Secondary | ICD-10-CM

## 2020-04-06 DIAGNOSIS — G4731 Primary central sleep apnea: Secondary | ICD-10-CM | POA: Diagnosis not present

## 2020-04-15 ENCOUNTER — Ambulatory Visit: Payer: Medicare Other

## 2020-04-15 DIAGNOSIS — G4733 Obstructive sleep apnea (adult) (pediatric): Secondary | ICD-10-CM

## 2020-04-15 MED FILL — CARVEDILOL 12.5 MG TABLET: 12.5 | 90 days supply | Qty: 180 | Fill #0

## 2020-04-15 NOTE — Procedures (Signed)
  Sleep Study Report  Patient Information  Name: Joshua May  ID: [id number] Birth Date: 09-Feb-1975  Age: 45 Gender:Male Study Date:04/06/2020 Referring Physician:  Marca Ancona, MD  TEST DESCRIPTION: Home sleep apnea testing was completed using the WatchPat, a Type 1 device, utilizing  peripheral arterial tonometry (PAT), chest movement,actigraphy, pulse oximetry, pulse rate, body position and snore.  AHI was calculated with apnea and hypopnea using valid sleep time as the denominator. RDI includes apneas,  hypopneas, and RERAs. The data acquired and the scoring of sleep and all associated events were performed in  accordance with the recommended standards and specifications as outlined in the AASM Manual for the Scoring of  Sleep and Associated Events 2.2.0 (2015).  FINDINGS: 1. Severe Obstructive Sleep Apnea with AHI 68.6/hr.  2. Moderate Central Sleep Apnea with pAHIc 19.1/hr. Cheyne Stoke Respirations 4.8% of sleep time. 3. Oxygen desaturations as low as 69%. 4. Severe snoring was present. O2 sats were < 88% for 215.67min. 5. Total sleep time was 7 hrs and 12 min. 6. 19.1% of total sleep time was spent in REM sleep.  7. Normal sleep onset latency at 17 min.  8. Prolonged REM sleep onset latency at 104 min.  9. Total awakenings were 25.   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 Clinical correlation of these findings is necessary. The decision to treat obstructive sleep apnea (OSA) is usually  based on the presence of apnea symptoms or the presence of associated medical conditions such as Hypertension,  Congestive Heart Failure, Atrial Fibrillation or Obesity. The most common symptoms of OSA are snoring, gasping for  breath while sleeping, daytime sleepiness and fatigue.   2. Initiating apnea therapy is recommended given the presence of symptoms and/or  associated conditions.   Recommend proceeding with one of the following:   a. Auto-CPAP therapy with a pressure range of 5-20cm H2O.   b. An oral appliance (OA) that can be obtained from certain dentists with expertise in sleep medicine. These are  primarily of use in non-obese patients with mild and moderate disease.   c. An ENT consultation which may be useful to look for specific causes of obstruction and possible treatment  Options.   d. If patient is intolerant to PAP therapy, consider referral to ENT for evaluation for hypoglossal nerve stimulator.   3. Close follow-up is necessary to ensure success with CPAP or oral appliance therapy for maximum benefit .  4. A follow-up oximetry study on CPAP is recommended to assess the adequacy of therapy and determine the need  for supplemental oxygen or the potential need for Bi-level therapy. An arterial blood gas to determine the adequacy of  baseline ventilation and oxygenation should also be considered.  5. Healthy sleep recommendations include: adequate nightly sleep (normal 7-9 hrs/night), avoidance of caffeine after  noon and alcohol near bedtime, and maintaining a sleep environment that is cool, dark and quiet.  6. Weight loss for overweight patients is recommended. Even modest amounts of weight loss can significantly  improve the severity of sleep apnea.  7. Snoring recommendations include: weight loss where appropriate, side sleeping, and avoidance of alcohol before  Bed.  8. Operation of motor vehicle or dangerous equipment must be avoided when feeling drowsy, excessively sleepy, or  mentally fatigued.  Report prepared by: Signature: Armanda Magic, MD [facility], Diplomat American Board of Sleep Medicine   Electronically Signed: Apr 15, 2020

## 2020-04-19 ENCOUNTER — Telehealth: Payer: Self-pay | Admitting: *Deleted

## 2020-04-19 DIAGNOSIS — G4733 Obstructive sleep apnea (adult) (pediatric): Secondary | ICD-10-CM

## 2020-04-19 NOTE — Telephone Encounter (Signed)
-----   Message from Quintella Reichert, MD sent at 04/15/2020  9:02 PM EDT ----- Please let patient know that they have sleep apnea and recommend CPAP titration. Please set up titration in the sleep lab ASAP.

## 2020-04-19 NOTE — Telephone Encounter (Signed)
Informed patient of sleep study results and patient understanding was verbalized. Patient understands his sleep study showed they have sleep apnea and recommend CPAP titration. Please set up titration in the sleep lab ASAP.   Pt is aware and agreeable to his results.  cpap titration sent to sleep pool

## 2020-04-20 ENCOUNTER — Other Ambulatory Visit (HOSPITAL_COMMUNITY): Payer: Self-pay

## 2020-04-24 LAB — LIPID PANEL
Chol/HDL Ratio: 5.4 ratio — ABNORMAL HIGH (ref 0.0–5.0)
Cholesterol, Total: 136 mg/dL (ref 100–199)
HDL: 25 mg/dL — ABNORMAL LOW (ref >39)
LDL Chol Calc (NIH): 89 mg/dL (ref 0–99)
Triglycerides: 121 mg/dL (ref 0–149)
VLDL Cholesterol Cal: 22 mg/dL (ref 5–40)

## 2020-04-26 ENCOUNTER — Other Ambulatory Visit (HOSPITAL_COMMUNITY): Payer: Self-pay

## 2020-04-26 ENCOUNTER — Encounter: Payer: Self-pay | Admitting: Internal Medicine

## 2020-04-26 ENCOUNTER — Ambulatory Visit (INDEPENDENT_AMBULATORY_CARE_PROVIDER_SITE_OTHER): Payer: Medicare Other | Admitting: Internal Medicine

## 2020-04-26 ENCOUNTER — Other Ambulatory Visit: Payer: Self-pay

## 2020-04-26 VITALS — BP 103/71 | HR 88 | Ht 71.0 in | Wt 284.0 lb

## 2020-04-26 DIAGNOSIS — E785 Hyperlipidemia, unspecified: Secondary | ICD-10-CM | POA: Diagnosis not present

## 2020-04-26 DIAGNOSIS — I2510 Atherosclerotic heart disease of native coronary artery without angina pectoris: Secondary | ICD-10-CM

## 2020-04-26 DIAGNOSIS — E782 Mixed hyperlipidemia: Secondary | ICD-10-CM | POA: Diagnosis not present

## 2020-04-26 MED FILL — Spironolactone Tab 25 MG: ORAL | 90 days supply | Qty: 90 | Fill #0 | Status: AC

## 2020-04-26 MED FILL — Atorvastatin Calcium Tab 80 MG (Base Equivalent): ORAL | 90 days supply | Qty: 90 | Fill #0 | Status: AC

## 2020-04-26 NOTE — Progress Notes (Signed)
LIPID CLINIC CONSULT NOTE  Chief Complaint:  Follow-up elevated triglycerides  Primary Care Physician: Arvilla Market, DO  Primary Cardiologist:  Rollene Rotunda, MD  HPI:  Joshua May is a 45 y.o. male who is being seen today for the evaluation of elevated triglycerides at the request of Leary Roca*.  This is a pleasant 45 year old male patient who have a history of ischemic cardiomyopathy and coronary artery disease with prior inferior MI, chronic systolic congestive heart failure and dyslipidemia.  He has been maintained on high potency atorvastatin 80 mg daily.  His lipids last year were well controlled with total cholesterol 110, triglycerides 140, HDL 31 and LDL 51.  Unfortunately repeat lipids now are much higher.  Total cholesterol is 134, triglycerides 381, HDL 24 and LDL of 76.  These lipids were fasting.  He does report some dietary indiscretions over the past year and there is been weight gain.  It seems that the weight gain is not associated with decompensated heart failure.  04/26/2020  Joshua May returns today for follow-up.  He is continue to make dietary changes and has had significant improvement in his lipids.  Total cholesterol now 136 with triglycerides 121, HDL 25 and LDL 89.  Triglycerides were as high as 381 about a year ago.  We discussed possibly adding Vascepa however he wanted to try to make changes with the diet.  He switched to an air Eunice Blase made some other dietary changes and it does appear to have helped his triglycerides a lot.  LDL remains still above a target less than 70 but that may improve more with again more activity and weight loss  PMHx:  Past Medical History:  Diagnosis Date  . Asthma   . CAD (coronary artery disease)   . Chronic systolic CHF (congestive heart failure) (HCC)   . HLD (hyperlipidemia)    Qualifier: Diagnosis of  By: Antoine Poche, MD, Gerrit Heck    . Hypercholesteremia   . Inferior myocardial infarction [facility])  2007   PCI of RCA  . Inferior myocardial infarction (HCC) 03/31/2008   PCI of RCA with bare metal stent  . Ischemic cardiomyopathy   . Migraines    and dizziness  . Mitral regurgitation   . Obstructive sleep apnea    Qualifier: Diagnosis of  By: Antoine Poche, MD, Gerrit Heck    . 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 Pulmonary embolism (HCC) 02/28/2017  . 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 Tobacco abuse     Past Surgical History:  Procedure Laterality Date  . CARDIAC CATHETERIZATION N/A 10/20/2015   Procedure: Left Heart Cath and Coronary Angiography;  Surgeon: Kathleene Hazel, MD;  Location: [facility] INVASIVE CV LAB;  Service: Cardiovascular;  Laterality: N/A;  . CARDIAC CATHETERIZATION N/A 10/20/2015   Procedure: Coronary Stent Intervention;  Surgeon: Kathleene Hazel, MD;  Location: [facility] INVASIVE CV LAB;  Service: Cardiovascular;  Laterality: N/A;  . ICD IMPLANT N/A 11/18/2017   Procedure: ICD IMPLANT;  Surgeon: Regan Lemming, MD;  Location: [facility] INVASIVE CV LAB;  Service: Cardiovascular;  Laterality: N/A;  . INTRAVASCULAR PRESSURE WIRE/FFR STUDY N/A 03/03/2017   Procedure: INTRAVASCULAR PRESSURE WIRE/FFR STUDY;  Surgeon: Marykay Lex, MD;  Location: [facility] INVASIVE CV LAB;  Service: Cardiovascular;  Laterality: N/A;  . LEFT HEART CATH AND CORONARY ANGIOGRAPHY N/A 03/03/2017   Procedure: LEFT HEART CATH  AND CORONARY ANGIOGRAPHY;  Surgeon: Marykay LexHarding, David W, MD;  [facility];  Service: Cardiovascular;  Laterality: N/A;  . MITRAL VALVE REPAIR Right 07/07/2017   Procedure: MINIMALLY INVASIVE MITRAL VALVE REPAIR (MVR) using Carpentier-McCarthy Adams Ring size 26;  Surgeon: Purcell Nailswen, Clarence H, MD;  Location: [facility] OR;  Service: Open Heart Surgery;  Laterality: Right;  . MULTIPLE EXTRACTIONS WITH ALVEOLOPLASTY N/A 05/13/2017   Procedure: Extraction of tooth #'s  2,15,16,17 with alveoloplasty and gross debridement of remaining teeth.;  Surgeon: Charlynne PanderKulinski, Ronald F, DDS;  [facility];  Service: Oral Surgery;  Laterality: N/A;  . none    . OTHER SURGICAL HISTORY     NONE  . PATENT FORAMEN OVALE(PFO) CLOSURE N/A 07/07/2017   Procedure: PATENT FORAMEN OVALE (PFO) CLOSURE;  Surgeon: Purcell Nailswen, Clarence H, MD;  [facility];  Service: Open Heart Surgery;  Laterality: N/A;  . RIGHT HEART CATH N/A 09/22/2017   Procedure: RIGHT HEART CATH;  Surgeon: Laurey MoraleMcLean, Dalton S, MD;  [facility];  Service: Cardiovascular;  Laterality: N/A;  . TEE WITHOUT CARDIOVERSION N/A 04/19/2017   Procedure: TRANSESOPHAGEAL ECHOCARDIOGRAM (TEE);  Surgeon: Laurey MoraleMcLean, Dalton S, MD;  [facility];  Service: Cardiovascular;  Laterality: N/A;  . TEE WITHOUT CARDIOVERSION N/A 07/07/2017   Procedure: TRANSESOPHAGEAL ECHOCARDIOGRAM (TEE);  Surgeon: Purcell Nailswen, Clarence H, MD;  [facility];  Service: Open Heart Surgery;  Laterality: N/A;    FAMHx:  Family History  Problem Relation Age of Onset  . Heart attack Sister 2530       S/P CABG  . Heart attack Father   . Deep vein thrombosis Father     SOCHx:   reports that he has quit smoking. His smoking use included cigarettes. He has a 8.50 pack-year smoking history. He has never used smokeless tobacco. He reports previous alcohol use. He reports previous drug use. Drug: Marijuana.  ALLERGIES:  Allergies  Allergen Reactions  . Shellfish Allergy Anaphylaxis    ROS: Pertinent items noted in HPI and remainder of comprehensive ROS otherwise negative.  HOME MEDS: Current Outpatient Medications on File Prior to Visit  Medication Sig Dispense Refill  . albuterol (VENTOLIN HFA) 108 (90 Base) MCG/ACT inhaler Inhale 2 puffs into the lungs every 6 (six) hours as needed for wheezing or shortness of breath. 18 g 1  . apixaban (ELIQUIS) 5 MG TABS tablet TAKE 1 TABLET (5 MG TOTAL) BY MOUTH 2 (TWO) TIMES DAILY. 180 tablet 3  .  atorvastatin (LIPITOR) 80 MG tablet TAKE 1 TABLET (80 MG TOTAL) BY MOUTH DAILY. 180 tablet 3  . carvedilol (COREG) 12.5 MG tablet TAKE 1 TABLET (12.5 MG TOTAL) BY MOUTH 2 (TWO) TIMES DAILY. 180 tablet 3  . empagliflozin (JARDIANCE) 10 MG TABS tablet TAKE 1 TABLET (10 MG TOTAL) BY MOUTH DAILY BEFORE BREAKFAST. 90 tablet 3  . fenofibrate (TRICOR) 145 MG tablet TAKE 1 TABLET (145 MG TOTAL) BY MOUTH DAILY. 60 tablet 6  . furosemide (LASIX) 40 MG tablet TAKE 1 TABLET (40 MG TOTAL) BY MOUTH AS DIRECTED. EVERY MONDAY, WEDNESDAY, FRIDAY 60 tablet 3  . potassium chloride SA (KLOR-CON) 20 MEQ tablet TAKE 1 TABLET (20 MEQ TOTAL) BY MOUTH AS DIRECTED. EVERY MONDAY, WEDNESDAY, FRIDAY 60 tablet 3  . sacubitril-valsartan (ENTRESTO) 97-103 MG TAKE 1 TABLET BY MOUTH 2 (TWO) TIMES DAILY. 180 tablet 3  . spironolactone (ALDACTONE) 25 MG tablet TAKE 1 TABLET (25 MG TOTAL) BY MOUTH EVERY MORNING 90 tablet 3  . tiotropium (SPIRIVA HANDIHALER) 18  MCG inhalation capsule Place 1 capsule (18 mcg total) into inhaler and inhale daily. 30 capsule 12  . ferrous sulfate 325 (65 FE) MG tablet Take 325 mg by mouth daily.    . folic acid (FOLVITE) 1 MG tablet Take 1 mg by mouth daily.     No current facility-administered medications on file prior to visit.    LABS/IMAGING: Results for orders placed or performed in visit on 06/01/19 (from the past 48 hour(s))  Lipid panel     Status: Abnormal   Collection Time: 04/24/20 11:51 AM  Result Value Ref Range   Cholesterol, Total 136 100 - 199 mg/dL   Triglycerides 242 0 - 149 mg/dL   HDL 25 (L) >68 mg/dL   VLDL Cholesterol Cal 22 5 - 40 mg/dL   LDL Chol Calc (NIH) 89 0 - 99 mg/dL   Chol/HDL Ratio 5.4 (H) 0.0 - 5.0 ratio    Comment:                                   T. Chol/HDL Ratio                                             Men  Women                               1/2 Avg.Risk  3.4    3.3                                   Avg.Risk  5.0    4.4                                 2X Avg.Risk  9.6    7.1                                3X Avg.Risk 23.4   11.0    No results found.  LIPID PANEL:    Component Value Date/Time   CHOL 136 04/24/2020 1151   TRIG 121 04/24/2020 1151   HDL 25 (L) 04/24/2020 1151   CHOLHDL 5.4 (H) 04/24/2020 1151   CHOLHDL 5.8 10/24/2019 1008   VLDL 36 10/24/2019 1008   LDLCALC 89 04/24/2020 1151   LDLDIRECT 149.8 11/15/2009 0933    WEIGHTS: Wt Readings from Last 3 Encounters:  04/26/20 284 lb (128.8 kg)  03/26/20 282 lb 3.2 oz (128 kg)  12/25/19 286 lb (129.7 kg)    VITALS: BP 103/71 (BP Location: Left Arm, Patient Position: Sitting)   Pulse 88   Ht 5\' 11"  (1.803 m)   Wt 284 lb (128.8 kg)   SpO2 95%   BMI 39.61 kg/m   EXAM: Deferred  EKG: Deferred  ASSESSMENT: 1. High triglycerides 2. Ischemic cardiomyopathy 3. Chronic systolic congestive heart failure 4. Morbid obesity  PLAN: 1.   Joshua May has had improvement in his triglycerides which is significant due to dietary changes.  As the weather is improving he hopes to be more active and make more dietary improvements.  I think it  is reasonable to give him an additional 6 months as he is already on a high potency statin to see if he can get his LDL to target.  If not we could add ezetimibe 10 mg daily at that point.  Plan repeat lipids in 6 months and follow-up afterwards.  Chrystie Nose, MD, [facility], FACP  [place]  [facility]  Medical Director of the [facility]  [facility]ovascular Risk Reduction Clinic Diplomate of the American Board of Clinical Lipidology Attending Cardiologist  Direct Dial: [phone]  Fax: [phone]  Website:  [email or web address] Cosandra Plouffe 04/26/2020, 10:23 AM

## 2020-04-26 NOTE — Patient Instructions (Signed)
Medication Instructions:  No changes  *If you need a refill on your cardiac medications before your next appointment, please call your pharmacy*   Lab Work: Lipid profile at 6 month appointment.  If you have labs (blood work) drawn today and your tests are completely normal, you will receive your results only by: Marland Kitchen MyChart Message (if you have MyChart) OR . A paper copy in the mail If you have any lab test that is abnormal or we need to change your treatment, we will call you to review the results.   Testing/Procedures: None ordered   Follow-Up: At [facility], you and your health needs are our priority.  As part of our continuing mission to provide you with exceptional heart care, we have created designated Provider Care Teams.  These Care Teams include your primary Cardiologist (physician) and Advanced Practice Providers (APPs -  Physician Assistants and Nurse Practitioners) who all work together to provide you with the care you need, when you need it.  We recommend signing up for the patient portal called "MyChart".  Sign up information is provided on this After Visit Summary.  MyChart is used to connect with patients for Virtual Visits (Telemedicine).  Patients are able to view lab/test results, encounter notes, upcoming appointments, etc.  Non-urgent messages can be sent to your provider as well.   To learn more about what you can do with MyChart, go to [email or web address].    Your next appointment:   6 month(s) - LIPID CLINIC  The format for your next appointment:   In Person  Provider:   K. Italy Hilty, MD

## 2020-04-29 ENCOUNTER — Other Ambulatory Visit (HOSPITAL_COMMUNITY): Payer: Self-pay

## 2020-04-29 MED FILL — Potassium Chloride Microencapsulated Crys ER Tab 20 mEq: ORAL | 90 days supply | Qty: 39 | Fill #0 | Status: AC

## 2020-05-09 ENCOUNTER — Ambulatory Visit (INDEPENDENT_AMBULATORY_CARE_PROVIDER_SITE_OTHER): Payer: Medicare Other

## 2020-05-09 DIAGNOSIS — I255 Ischemic cardiomyopathy: Secondary | ICD-10-CM | POA: Diagnosis not present

## 2020-05-09 LAB — CUP PACEART REMOTE DEVICE CHECK
Battery Remaining Longevity: 121 mo
Battery Voltage: 3.02 V
Brady Statistic RV Percent Paced: 0.04 %
Date Time Interrogation Session: 20220421012504
HighPow Impedance: 73 Ohm
Implantable Lead Implant Date: 20191031
Implantable Lead Location: 753860
Implantable Lead Model: 6935
Implantable Pulse Generator Implant Date: 20191031
Lead Channel Impedance Value: 361 Ohm
Lead Channel Impedance Value: 456 Ohm
Lead Channel Pacing Threshold Amplitude: 0.625 V
Lead Channel Pacing Threshold Pulse Width: 0.4 ms
Lead Channel Sensing Intrinsic Amplitude: 10 mV
Lead Channel Sensing Intrinsic Amplitude: 10 mV
Lead Channel Setting Pacing Amplitude: 2 V
Lead Channel Setting Pacing Pulse Width: 0.4 ms
Lead Channel Setting Sensing Sensitivity: 0.45 mV

## 2020-05-17 ENCOUNTER — Other Ambulatory Visit (HOSPITAL_COMMUNITY): Payer: Self-pay

## 2020-05-17 MED FILL — Fenofibrate Tab 145 MG: ORAL | 60 days supply | Qty: 60 | Fill #0 | Status: AC

## 2020-05-23 ENCOUNTER — Telehealth: Payer: Self-pay | Admitting: *Deleted

## 2020-05-23 NOTE — Telephone Encounter (Signed)
This patient has tradtional Medicare and Medicaid and does not need a PA. Ok to schedule titration study.

## 2020-05-23 NOTE — Telephone Encounter (Signed)
-----   Message from Reesa Chew, CMA sent at 05/21/2020  5:44 PM EDT ----- Regarding: precert cpap titration

## 2020-05-27 NOTE — Progress Notes (Signed)
Remote ICD transmission.   

## 2020-06-04 ENCOUNTER — Other Ambulatory Visit (HOSPITAL_COMMUNITY): Payer: Self-pay

## 2020-06-04 MED FILL — Furosemide Tab 40 MG: ORAL | 84 days supply | Qty: 36 | Fill #0 | Status: AC

## 2020-06-05 NOTE — Telephone Encounter (Signed)
Patient is scheduled for CPAP Titration on 07-29-20. Patient understands his titration study will be done at [facility] sleep lab. Patient understands he will receive a letter in a week or so detailing appointment, date, time, and location. Patient understands to call if he does not receive the letter  in a timely manner. Patient agrees with treatment and thanked me for call.

## 2020-06-13 ENCOUNTER — Telehealth (HOSPITAL_COMMUNITY): Payer: Self-pay | Admitting: Licensed Clinical Social Worker

## 2020-06-13 NOTE — Telephone Encounter (Signed)
CSW contacted patient to remind of [facility] meeting tomorrow 06-14-20 at 10am. Patient will try to attend. CSW available as needed. Jackie Victorya Hillman, LCSW, CCSW-MCS [phone]  

## 2020-06-14 ENCOUNTER — Other Ambulatory Visit (HOSPITAL_COMMUNITY): Payer: Self-pay

## 2020-06-14 MED FILL — Sacubitril-Valsartan Tab 97-103 MG: ORAL | 90 days supply | Qty: 180 | Fill #0 | Status: AC

## 2020-06-24 ENCOUNTER — Other Ambulatory Visit (HOSPITAL_COMMUNITY): Payer: Self-pay

## 2020-06-24 MED FILL — Empagliflozin Tab 10 MG: ORAL | 90 days supply | Qty: 90 | Fill #0 | Status: AC

## 2020-06-24 MED FILL — Apixaban Tab 5 MG: ORAL | 90 days supply | Qty: 180 | Fill #0 | Status: AC

## 2020-07-17 ENCOUNTER — Other Ambulatory Visit (HOSPITAL_COMMUNITY): Payer: Self-pay

## 2020-07-17 ENCOUNTER — Telehealth (HOSPITAL_COMMUNITY): Payer: Self-pay | Admitting: *Deleted

## 2020-07-17 MED FILL — Carvedilol Tab 12.5 MG: ORAL | 90 days supply | Qty: 180 | Fill #0 | Status: AC

## 2020-07-17 MED FILL — Fenofibrate Tab 145 MG: ORAL | 60 days supply | Qty: 60 | Fill #1 | Status: AC

## 2020-07-17 NOTE — Telephone Encounter (Signed)
Pt called c/o pain in left hip that goes down into his leg for a few weeks. More painful at rest. Pt denies tenderness, swelling, redness, rash, or "fever". Pt denies fall or injury. No other complaints at this time. I told pt it does not sound cardiac related but I will forward to a provider.  Routed to [employer] for advice

## 2020-07-17 NOTE — Telephone Encounter (Signed)
Pt advised and will f/u with pcp.

## 2020-07-26 ENCOUNTER — Other Ambulatory Visit (HOSPITAL_COMMUNITY): Payer: Self-pay

## 2020-07-26 MED FILL — Atorvastatin Calcium Tab 80 MG (Base Equivalent): ORAL | 90 days supply | Qty: 90 | Fill #1 | Status: AC

## 2020-07-26 MED FILL — Potassium Chloride Microencapsulated Crys ER Tab 20 mEq: ORAL | 90 days supply | Qty: 39 | Fill #1 | Status: AC

## 2020-07-26 MED FILL — Spironolactone Tab 25 MG: ORAL | 90 days supply | Qty: 90 | Fill #1 | Status: AC

## 2020-07-29 ENCOUNTER — Other Ambulatory Visit: Payer: Self-pay

## 2020-07-29 ENCOUNTER — Ambulatory Visit (HOSPITAL_BASED_OUTPATIENT_CLINIC_OR_DEPARTMENT_OTHER): Payer: Medicare Other | Attending: Cardiology | Admitting: Cardiology

## 2020-07-29 DIAGNOSIS — G4733 Obstructive sleep apnea (adult) (pediatric): Secondary | ICD-10-CM | POA: Diagnosis present

## 2020-07-30 ENCOUNTER — Telehealth: Payer: Self-pay | Admitting: *Deleted

## 2020-07-30 NOTE — Telephone Encounter (Signed)
-----   Message from Quintella Reichert, MD sent at 07/30/2020  3:02 PM EDT ----- Please let patient know that they had a successful PAP titration and let DME know that orders are in EPIC.  Please set up 6 week OV with me.

## 2020-07-30 NOTE — Telephone Encounter (Signed)
The patient has been notified of the result and verbalized understanding.  All questions (if any) were answered. Latrelle Dodrill, CMA 07/30/2020 5:39 PM    Upon patient request DME selection is [facility] Patient understands he will be contacted by [facility] to set up his cpap. Patient understands to call if [facility] does not contact him with new setup in a timely manner. Patient understands they will be called once confirmation has been received from [facility] that they have received their new machine to schedule 10 week follow up appointment.   [facility] notified of new cpap order  Please add to airview Patient was grateful for the call and thanked me

## 2020-07-30 NOTE — Procedures (Signed)
   Patient Name: Joshua May, Joshua May Study Date: 07/29/2020 Gender: Male D.O.B: September 14, 1975 Age (years): 74 Referring Provider: Armanda Magic MD, ABSM Height (inches): 71 Interpreting Physician: Armanda Magic MD, ABSM Weight (lbs): 278 RPSGT: Rosette Reveal BMI: 39 MRN: [id number] Neck Size: 19.50  CLINICAL INFORMATION The patient is referred for a BiPAP titration to treat sleep apnea.  Date of NPSG, Split Night or HST:  SLEEP STUDY TECHNIQUE As per the AASM Manual for the Scoring of Sleep and Associated Events v2.3 (April 2016) with a hypopnea requiring 4% desaturations.  The channels recorded and monitored were frontal, central and occipital EEG, electrooculogram (EOG), submentalis EMG (chin), nasal and oral airflow, thoracic and abdominal wall motion, anterior tibialis EMG, snore microphone, electrocardiogram, and pulse oximetry. Bilevel positive airway pressure (BPAP) was initiated at the beginning of the study and titrated to treat sleep-disordered breathing.  MEDICATIONS Medications self-administered by patient taken the night of the study : N/A  RESPIRATORY PARAMETERS Optimal IPAP Pressure (cm): 28  AHI at Optimal Pressure (/hr) 0 Optimal EPAP Pressure (cm):24  Overall Minimal O2 (%):88.0  Minimal O2 at Optimal Pressure (%): 93.0  SLEEP ARCHITECTURE Start Time:10:00:45 PM  Stop Time: 4:29:14 AM  Total Time (min)388.5  Total Sleep Time (min):257.5 Sleep Latency (min):10.2  Sleep Efficiency (%):66.3%  REM Latency (min):106.5  WASO (min):120.7 Stage N1 (%):16.1%  Stage N2 (%): 56.1%  Stage N3 (%): 0.0%  Stage R (%): 27.8 Supine (%):56.12  Arousal Index (/hr):11.9   CARDIAC DATA The 2 lead EKG demonstrated sinus rhythm. The mean heart rate was 74.2 beats per minute. Other EKG findings include: None.  LEG MOVEMENT DATA The total Periodic Limb Movements of Sleep (PLMS) were 0. The PLMS index was 0.0. A PLMS index of <15 is considered normal in adults.  IMPRESSIONS -  An optimal PAP pressure was selected for this patient ( 28 / cm of water) - Central sleep apnea was not noted during this titration (CAI = 1.4/h). - Mild oxygen desaturations were observed during this titration (min O2 = 88.0%). - The patient snored with loud snoring volume. - No cardiac abnormalities were observed during this study. - Clinically significant periodic limb movements were not noted during this study. Arousals associated with PLMs were rare.  DIAGNOSIS - Obstructive Sleep Apnea (G47.33)  RECOMMENDATIONS - Trial of auto BiPAP therapy with IPAP max 20cm H2O, EPAP min 5cm H2O and PS 4cm H2O with a Large size Fisher&Paykel Full Face Mask F&P Vitera (new) mask and heated humidification. - Avoid alcohol, sedatives and other CNS depressants that may worsen sleep apnea and disrupt normal sleep architecture. - Sleep hygiene should be reviewed to assess factors that may improve sleep quality. - Weight management and regular exercise should be initiated or continued. - Return to [facility] for re-evaluation after 4 weeks of therapy  [Electronically signed] 07/30/2020 02:59 PM  Armanda Magic MD, ABSM Diplomate, American Board of Sleep Medicine

## 2020-08-08 ENCOUNTER — Ambulatory Visit (INDEPENDENT_AMBULATORY_CARE_PROVIDER_SITE_OTHER): Payer: Medicare Other

## 2020-08-08 DIAGNOSIS — I255 Ischemic cardiomyopathy: Secondary | ICD-10-CM

## 2020-08-08 LAB — CUP PACEART REMOTE DEVICE CHECK
Battery Remaining Longevity: 119 mo
Battery Voltage: 3.02 V
Brady Statistic RV Percent Paced: 0.04 %
Date Time Interrogation Session: 20220721033324
HighPow Impedance: 75 Ohm
Implantable Lead Implant Date: 20191031
Implantable Lead Location: 753860
Implantable Lead Model: 6935
Implantable Pulse Generator Implant Date: 20191031
Lead Channel Impedance Value: 342 Ohm
Lead Channel Impedance Value: 399 Ohm
Lead Channel Pacing Threshold Amplitude: 0.625 V
Lead Channel Pacing Threshold Pulse Width: 0.4 ms
Lead Channel Sensing Intrinsic Amplitude: 10.125 mV
Lead Channel Sensing Intrinsic Amplitude: 10.125 mV
Lead Channel Setting Pacing Amplitude: 2 V
Lead Channel Setting Pacing Pulse Width: 0.4 ms
Lead Channel Setting Sensing Sensitivity: 0.45 mV

## 2020-08-23 ENCOUNTER — Encounter (HOSPITAL_COMMUNITY): Payer: Self-pay | Admitting: Cardiology

## 2020-08-23 ENCOUNTER — Other Ambulatory Visit (HOSPITAL_COMMUNITY): Payer: Self-pay

## 2020-08-23 ENCOUNTER — Other Ambulatory Visit: Payer: Self-pay

## 2020-08-23 ENCOUNTER — Ambulatory Visit (HOSPITAL_COMMUNITY)
Admission: RE | Admit: 2020-08-23 | Discharge: 2020-08-23 | Disposition: A | Discharge status: Home / Self Care | Payer: Medicare Other | Source: Ambulatory Visit | Attending: Cardiology | Admitting: Cardiology

## 2020-08-23 VITALS — BP 108/60 | HR 78 | Wt 278.2 lb

## 2020-08-23 DIAGNOSIS — Z8249 Family history of ischemic heart disease and other diseases of the circulatory system: Secondary | ICD-10-CM | POA: Diagnosis not present

## 2020-08-23 DIAGNOSIS — Z87891 Personal history of nicotine dependence: Secondary | ICD-10-CM | POA: Diagnosis not present

## 2020-08-23 DIAGNOSIS — I252 Old myocardial infarction: Secondary | ICD-10-CM | POA: Insufficient documentation

## 2020-08-23 DIAGNOSIS — I5022 Chronic systolic (congestive) heart failure: Secondary | ICD-10-CM | POA: Diagnosis not present

## 2020-08-23 DIAGNOSIS — I255 Ischemic cardiomyopathy: Secondary | ICD-10-CM | POA: Diagnosis not present

## 2020-08-23 DIAGNOSIS — I050 Rheumatic mitral stenosis: Secondary | ICD-10-CM | POA: Insufficient documentation

## 2020-08-23 DIAGNOSIS — Z955 Presence of coronary angioplasty implant and graft: Secondary | ICD-10-CM | POA: Insufficient documentation

## 2020-08-23 DIAGNOSIS — Z7984 Long term (current) use of oral hypoglycemic drugs: Secondary | ICD-10-CM | POA: Insufficient documentation

## 2020-08-23 DIAGNOSIS — Z7901 Long term (current) use of anticoagulants: Secondary | ICD-10-CM | POA: Diagnosis not present

## 2020-08-23 DIAGNOSIS — I2510 Atherosclerotic heart disease of native coronary artery without angina pectoris: Secondary | ICD-10-CM | POA: Insufficient documentation

## 2020-08-23 DIAGNOSIS — M545 Low back pain, unspecified: Secondary | ICD-10-CM | POA: Diagnosis not present

## 2020-08-23 DIAGNOSIS — Z95810 Presence of automatic (implantable) cardiac defibrillator: Secondary | ICD-10-CM | POA: Insufficient documentation

## 2020-08-23 DIAGNOSIS — E785 Hyperlipidemia, unspecified: Secondary | ICD-10-CM | POA: Insufficient documentation

## 2020-08-23 DIAGNOSIS — Z79899 Other long term (current) drug therapy: Secondary | ICD-10-CM | POA: Insufficient documentation

## 2020-08-23 DIAGNOSIS — G4733 Obstructive sleep apnea (adult) (pediatric): Secondary | ICD-10-CM | POA: Insufficient documentation

## 2020-08-23 LAB — BASIC METABOLIC PANEL
Anion gap: 7 (ref 5–15)
BUN: 16 mg/dL (ref 6–20)
CO2: 24 mmol/L (ref 22–32)
Calcium: 8.8 mg/dL — ABNORMAL LOW (ref 8.9–10.3)
Chloride: 107 mmol/L (ref 98–111)
Creatinine, Ser: 1.11 mg/dL (ref 0.61–1.24)
GFR, Estimated: >60 mL/min (ref >60)
Glucose, Bld: 95 mg/dL (ref 70–99)
Potassium: 3.8 mmol/L (ref 3.5–5.1)
Sodium: 138 mmol/L (ref 135–145)

## 2020-08-23 LAB — BRAIN NATRIURETIC PEPTIDE: B Natriuretic Peptide: 54.3 pg/mL (ref 0.0–100.0)

## 2020-08-23 MED ORDER — CYCLOBENZAPRINE HCL 5 MG PO TABS
5.0000 mg | ORAL_TABLET | Freq: Three times a day (TID) | ORAL | 0 refills | Status: DC | PRN
  Filled 2020-08-23: qty 14, 5d supply, fill #0

## 2020-08-23 MED ORDER — EZETIMIBE 10 MG PO TABS
10.0000 mg | ORAL_TABLET | Freq: Every day | ORAL | 11 refills | Status: DC
  Filled 2020-08-23: qty 30, 30d supply, fill #0
  Filled 2020-09-17: qty 30, 30d supply, fill #1
  Filled 2020-10-23: qty 30, 30d supply, fill #2
  Filled 2020-11-13: qty 30, 30d supply, fill #3
  Filled 2020-12-25: qty 30, 30d supply, fill #4
  Filled 2021-01-15: qty 30, 30d supply, fill #5
  Filled 2021-02-19: qty 30, 30d supply, fill #6
  Filled 2021-03-27: qty 30, 30d supply, fill #7
  Filled 2021-04-24: qty 30, 30d supply, fill #8
  Filled 2021-05-21: qty 30, 30d supply, fill #9
  Filled 2021-06-25: qty 30, 30d supply, fill #10
  Filled 2021-07-24: qty 30, 30d supply, fill #11

## 2020-08-23 NOTE — Patient Instructions (Addendum)
EKG done today.  Labs done today. We will contact you only if your labs are abnormal.  START Zetia 10mg  (1 tablet) by mouth daily.   No other medication changes were made. Please continue all current medications as prescribed.  Your physician recommends that you schedule a follow-up appointment in: 2 months for a lab only appointment and in 3 months with an echo prior to your exam.  If you have any questions or concerns before your next appointment please send a message through [facility] or call our office at [phone].    TO LEAVE A MESSAGE FOR THE NURSE SELECT OPTION 2, PLEASE LEAVE A MESSAGE INCLUDING: YOUR NAME DATE OF BIRTH CALL BACK NUMBER REASON FOR CALL**this is important as we prioritize the call backs  YOU WILL RECEIVE A CALL BACK THE SAME DAY AS LONG AS YOU CALL BEFORE 4:00 PM   Do the following things EVERYDAY: Weigh yourself in the morning before breakfast. Write it down and keep it in a log. Take your medicines as prescribed Eat low salt foods--Limit salt (sodium) to 2000 mg per day.  Stay as active as you can everyday Limit all fluids for the day to less than 2 liters   At the [facility], you and your health needs are our priority. As part of our continuing mission to provide you with exceptional heart care, we have created designated Provider Care Teams. These Care Teams include your primary Cardiologist (physician) and Advanced Practice Providers (APPs- Physician Assistants and Nurse Practitioners) who all work together to provide you with the care you need, when you need it.   You may see any of the following providers on your designated Care Team at your next follow up: Dr [phone] Dr Arvilla Meres, NP Carron Curie, Robbie Lis [place], PharmD   Please be sure to bring in all your medications bottles to every appointment.

## 2020-08-23 NOTE — Progress Notes (Signed)
CSW requested to meet with patient to assist with a new PCP. Patient well known to CSW from previous attendance in [facility]. CSW provided list of PCP's and also the flyer for the [facility] which has restarted since Covid. Patient and wife appreciative of the information and will follow up with a PCP appointment. Lasandra Beech, LCSW, CCSW-MCS [phone]

## 2020-08-23 NOTE — Progress Notes (Signed)
PCP: None  HF Cardiology: Dr Shirlee Latch  CT Surgery: Dr Cornelius Moras  HPI: Joshua May is a 45 y.o. male AA male with a history of CAD dating back to 2007 when he had a PCI in [place]. In 2010 he presented with a STEMI and had an RCA DES placed. He followed up for a year but then didn't present again till Oct 2017 when he presented with a CFX infarct. Cath revealed 50% ISR of the RCA, total mCFX and 85% OM3. He had CFX PCI with DES and OM3 POBA. His EF then was 50-55%.  Admitted 02/27/17 with increased dyspnea and chest pain. CTA confirmed PE. ECHO completed and showed reduced EF 20-25%. Underwent LHC as noted below. [place] no evidence DVTs. CT surgery consulted for severe MR. Plan to optimize HF medications.Set up TEE as an outpatient. Discharge weight 222 pounds.   Admitted 06/2017 with scheduled MV repair. S/P Minimally invasive mitral valve repair on 6/19. ECHO 07/12/17 post surgery, no MR was noted but EF remained 25-30%. Tachycardic at the time of discharge. Discharge weight 257 pounds.   CPX was submaximal in 8/19, suggestive of marked deconditioning.  RHC in 9/19 showed relatively preserved cardiac index with only mildly elevated filling pressures.  Echo in 9/19 showed EF 30-35%. MDT ICD placed in 2019.   Echo in 8/21 showed EF 45%,RV normal, s/p MV repair with mean gradient 5 and MVA 1.78 cm^2 (mild mitral stenosis), trivial MR.   He returns today for followup of CHF and CAD.  Weight is down 4 lbs again.  He has CPAP and is waiting for CPAP. No dyspnea walking on flat ground.  He is able to get around [employer] without problems.  No orthopnea/PND. No chest pain.  He is not smoking. Main complaint is low back pain.   ECG (personally reviewed): NSR, old inferior MI, LAFB  Medtronic device interrogation: Stable thoracic impedance, fluid index < threshold, no AF/VT.    Labs (7/19): K 4.5, creatinine 0.95, LDL 51 Labs (8/19): K 3.8, creatinine 0.99 Labs (5/21): K 4.8, creatinine 1.08, LDL 34, TGs 381 Labs  (10/21): LDL 78, HDL 74, TGs 178 Labs (12/21): K 4.2, creatinine 1.3 Labs (3/22): K 3.7, creatinine 1.12 Labs (4/22): LDL 89  PMH: 1. CAD: Initial PCI in 2007 in [place], inferior MI.   - Inferior STEMI 2010 with RCA PCI.  - CFx infarct in 2017 with LCx DES and OM3 POBA.  - LHC (2/19): Nonobstructive CAD.  2. 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  - TEE (4/19): LVEF 35-40%, Severe MR with PISA ERO 0.43 cm^2. Suspect infarct related MR with tethering of the posterior leaflet.  - Minimally invasive MV repair in 6/19.   3. Chronic systolic CHF: Ischemic cardiomyopathy. Medtronic ICD.  - Echo (2/19): EF 25-30%, diffuse HK with inferolateral AK, mildly decreased RV systolic function with D-shaped septum, moderate-severe infarct-related MR.  - Echo (6/19): EF 25-30%, apical and inferolateral hypokinesis, MV repair with mean gradient 9 mmHg with no MR, mildly decreased RV systolic function.  - CPX (8/19): VO2 13.2, VE/VCO2 slope 28, RER 0.87.  This study was suggestive of severe deconditioning.  - RHC (9/19): mean RA 9, PA 39/23 mean 24, mean PCWP 17, CI 2.2, PVR 1.35 WU - Echo (9/19): EF 30-35%, diffuse hypokinesis, normal RV, s/p MV repair with mean gradient 6 mmHg and PHT 104 msec => probably no significant stenosis.  - Echo (8/21): EF 45%, RV normal, s/p MV repair with mean gradient 5 and MVA 1.78 cm^2 (  mild mitral stenosis), trivial MR.  4. Hyperlipidemia 5. PE: 2/19.  6. COPD: Moderate COPD on 6/19 PFTs.   Review of systems complete and found to be negative unless listed in HPI.    Social History   Socioeconomic History   Marital status: Married    Spouse name: Not on file   Number of children: Not on file   Years of education: Not on file   Highest education level: Not on file  Occupational History   Not on file  Tobacco Use   Smoking status: Former    Packs/day: 0.50    Years: 17.00    Pack years: 8.50    Types: Cigarettes   Smokeless tobacco: Never   Tobacco comments:     quit since heart attack in 10/2015  Vaping Use   Vaping Use: Never used  Substance and Sexual Activity   Alcohol use: Not Currently   Drug use: Not Currently    Types: Marijuana   Sexual activity: Not on file  Other Topics Concern   Not on file  Social History Narrative   The patient is married with two children.   Social Determinants of Health   Financial Resource Strain: Not on file  Food Insecurity: Not on file  Transportation Needs: Not on file  Physical Activity: Not on file  Stress: Not on file  Social Connections: Not on file  Intimate Partner Violence: Not on file   Family History  Problem Relation Age of Onset   Heart attack Sister 23       S/P CABG   Heart attack Father    Deep vein thrombosis Father     Current Outpatient Medications  Medication Sig Dispense Refill   albuterol (VENTOLIN HFA) 108 (90 Base) MCG/ACT inhaler Inhale 2 puffs into the lungs every 6 (six) hours as needed for wheezing or shortness of breath. 18 g 1   apixaban (ELIQUIS) 5 MG TABS tablet TAKE 1 TABLET (5 MG TOTAL) BY MOUTH 2 (TWO) TIMES DAILY. 180 tablet 3   atorvastatin (LIPITOR) 80 MG tablet TAKE 1 TABLET (80 MG TOTAL) BY MOUTH DAILY. 180 tablet 3   carvedilol (COREG) 12.5 MG tablet TAKE 1 TABLET (12.5 MG TOTAL) BY MOUTH 2 (TWO) TIMES DAILY. 180 tablet 3   cyclobenzaprine (FLEXERIL) 5 MG tablet Take 1 tablet (5 mg total) by mouth 3 (three) times daily as needed for muscle spasms. 14 tablet 0   empagliflozin (JARDIANCE) 10 MG TABS tablet TAKE 1 TABLET (10 MG TOTAL) BY MOUTH DAILY BEFORE BREAKFAST. 90 tablet 3   ezetimibe (ZETIA) 10 MG tablet Take 1 tablet (10 mg total) by mouth daily. 30 tablet 11   fenofibrate (TRICOR) 145 MG tablet TAKE 1 TABLET (145 MG TOTAL) BY MOUTH DAILY. 60 tablet 6   ferrous sulfate 325 (65 FE) MG tablet Take 325 mg by mouth daily.     folic acid (FOLVITE) 1 MG tablet Take 1 mg by mouth daily.     furosemide (LASIX) 40 MG tablet TAKE 1 TABLET (40 MG TOTAL) BY MOUTH  AS DIRECTED. EVERY MONDAY, WEDNESDAY, FRIDAY 60 tablet 3   potassium chloride SA (KLOR-CON) 20 MEQ tablet TAKE 1 TABLET (20 MEQ TOTAL) BY MOUTH AS DIRECTED. EVERY MONDAY, WEDNESDAY, FRIDAY 60 tablet 3   sacubitril-valsartan (ENTRESTO) 97-103 MG TAKE 1 TABLET BY MOUTH 2 (TWO) TIMES DAILY. 180 tablet 3   spironolactone (ALDACTONE) 25 MG tablet TAKE 1 TABLET (25 MG TOTAL) BY MOUTH EVERY MORNING 90 tablet 3  tiotropium (SPIRIVA HANDIHALER) 18 MCG inhalation capsule Place 1 capsule (18 mcg total) into inhaler and inhale daily. 30 capsule 12   No current facility-administered medications for this encounter.   Vitals:   08/23/20 1150  BP: 108/60  Pulse: 78  SpO2: 97%  Weight: 126.2 kg (278 lb 3.2 oz)    Wt Readings from Last 3 Encounters:  08/23/20 126.2 kg (278 lb 3.2 oz)  07/29/20 126.1 kg (278 lb)  04/26/20 128.8 kg (284 lb)    PHYSICAL EXAM: General: NAD Neck: No JVD, no thyromegaly or thyroid nodule.  Lungs: Clear to auscultation bilaterally with normal respiratory effort. CV: Nondisplaced PMI.  Heart regular S1/S2, no S3/S4, no murmur.  No peripheral edema.  No carotid bruit.  Normal pedal pulses.  Abdomen: Soft, nontender, no hepatosplenomegaly, no distention.  Skin: Intact without lesions or rashes.  Neurologic: Alert and oriented x 3.  Psych: Normal affect. Extremities: No clubbing or cyanosis.  HEENT: Normal.   ASSESSMENT & PLAN: 1. Chronic systolic CHF: Ischemic cardiomyopathy. Echo in 6/19 with EF 25-30%, echo 9/19 with EF 30-35%.  Medtronic ICD.  Echo in 8/21 showed EF up to 45%.  He is not volume overloaded on exam or by Optivol. NYHA class II symptoms, suspect symptoms are more due to COPD than to CHF.   - Continue spironolactone 25 mg daily.   - Continue Coreg 12.5 mg bid, BP on the lower side, will not increase.   - Continue Entresto 49/51 bid, BP on the lower side, will not increase.   - Continue Lasix 40 mg tiw.  BMET today.  - Continue empagliflozin 10 mg daily.   - I will repeat echo in 3 months at followup. 2. CAD: Stents in LCx and RCA. LHC 02/2017 showed nonobstructive disease. No chest pain.   - Continue atorvastatin 80 mg daily.  - He is on warfarin so no ASA 81.  3. PE: In 2019, no definite trigger. Father with h/o VTE.  He should likely have long-term anticoagulation.   - Continue Eliquis.  4. Mitral regurgitation: Ischemic MR.  S/p minimally invasive MV repair in 6/19. Post-op echo 6/19 showed no MR but mean gradient 9 mmHg across MV. Echo (9/19) with mean MV gradient 6 mmHg but PHT short at 104 msec, probably no significant stenosis.  Echo in 8/21 showed mean gradient 5 mmHg across repaired MV with MVA 1.78 cm^2.  Suspect mild mitral stenosis, only trivial MR.  5. COPD: I think this is a major source for dyspnea.   6. Hyperlipidemia: Goal LDL < 70 (ideally 55).  LDL too high in 4/22 (89).  - Continue atorvastatin 80 mg daily.  - Add Zetia 10 mg daily.  Lipids in 2 months.  If still too high, to lipid clinic for Repatha.  7. OSA: Waiting to get CPAP.    8. Low back pain: Avoid NSAIDs, I will let him try cyclobenzaprine.   Followup in 3 months with echo.   Marca Ancona 08/23/2020

## 2020-08-26 ENCOUNTER — Other Ambulatory Visit (HOSPITAL_COMMUNITY): Payer: Self-pay

## 2020-08-26 MED FILL — Furosemide Tab 40 MG: ORAL | 84 days supply | Qty: 36 | Fill #1 | Status: AC

## 2020-08-29 NOTE — Progress Notes (Signed)
Remote ICD transmission.   

## 2020-09-17 MED FILL — Empagliflozin Tab 10 MG: ORAL | 90 days supply | Qty: 90 | Fill #1 | Status: AC

## 2020-09-17 MED FILL — Sacubitril-Valsartan Tab 97-103 MG: ORAL | 90 days supply | Qty: 180 | Fill #1 | Status: AC

## 2020-09-17 MED FILL — Fenofibrate Tab 145 MG: ORAL | 60 days supply | Qty: 60 | Fill #2 | Status: AC

## 2020-09-18 ENCOUNTER — Other Ambulatory Visit (HOSPITAL_COMMUNITY): Payer: Self-pay

## 2020-09-19 ENCOUNTER — Other Ambulatory Visit (HOSPITAL_COMMUNITY): Payer: Self-pay

## 2020-10-07 ENCOUNTER — Other Ambulatory Visit (HOSPITAL_COMMUNITY): Payer: Self-pay

## 2020-10-07 MED FILL — Apixaban Tab 5 MG: ORAL | 90 days supply | Qty: 180 | Fill #1 | Status: AC

## 2020-10-23 ENCOUNTER — Ambulatory Visit (HOSPITAL_COMMUNITY)
Admission: RE | Admit: 2020-10-23 | Discharge: 2020-10-23 | Disposition: A | Discharge status: Home / Self Care | Payer: Medicare Other | Source: Ambulatory Visit | Attending: Cardiology | Admitting: Cardiology

## 2020-10-23 ENCOUNTER — Other Ambulatory Visit: Payer: Self-pay

## 2020-10-23 ENCOUNTER — Other Ambulatory Visit (HOSPITAL_COMMUNITY): Payer: Self-pay

## 2020-10-23 DIAGNOSIS — E785 Hyperlipidemia, unspecified: Secondary | ICD-10-CM | POA: Diagnosis not present

## 2020-10-23 LAB — HEPATIC FUNCTION PANEL
ALT: 69 U/L — ABNORMAL HIGH (ref 0–44)
AST: 51 U/L — ABNORMAL HIGH (ref 15–41)
Albumin: 3.9 g/dL (ref 3.5–5.0)
Alkaline Phosphatase: 46 U/L (ref 38–126)
Bilirubin, Direct: 0.1 mg/dL (ref 0.0–0.2)
Indirect Bilirubin: 0.5 mg/dL (ref 0.3–0.9)
Total Bilirubin: 0.6 mg/dL (ref 0.3–1.2)
Total Protein: 6.8 g/dL (ref 6.5–8.1)

## 2020-10-23 LAB — LIPID PANEL
Cholesterol: 123 mg/dL (ref 0–200)
HDL: 28 mg/dL — ABNORMAL LOW (ref >40)
LDL Cholesterol: 74 mg/dL (ref 0–99)
Total CHOL/HDL Ratio: 4.4 RATIO
Triglycerides: 107 mg/dL (ref <150)
VLDL: 21 mg/dL (ref 0–40)

## 2020-10-23 MED FILL — Spironolactone Tab 25 MG: ORAL | 90 days supply | Qty: 90 | Fill #2 | Status: AC

## 2020-10-23 MED FILL — Carvedilol Tab 12.5 MG: ORAL | 90 days supply | Qty: 180 | Fill #1 | Status: AC

## 2020-10-23 MED FILL — Atorvastatin Calcium Tab 80 MG (Base Equivalent): ORAL | 90 days supply | Qty: 90 | Fill #2 | Status: AC

## 2020-10-24 ENCOUNTER — Telehealth (HOSPITAL_COMMUNITY): Payer: Self-pay

## 2020-10-24 DIAGNOSIS — I5022 Chronic systolic (congestive) heart failure: Secondary | ICD-10-CM

## 2020-10-24 DIAGNOSIS — R7989 Other specified abnormal findings of blood chemistry: Secondary | ICD-10-CM

## 2020-10-24 NOTE — Telephone Encounter (Signed)
-----   Message from Laurey Morale, MD sent at 10/23/2020  4:15 PM EDT ----- LFTs mildly elevated.  Repeat LFTs in 2-3 weeks.  Cut out any ETOH for now.

## 2020-10-24 NOTE — Telephone Encounter (Signed)
Patient advised and verbalized understanding,lab appointment scheduled,lab orders entered  Orders Placed This Encounter  Procedures   Hepatic function panel    Standing Status:   Future    Standing Expiration Date:   10/24/2021    Order Specific Question:   Release to patient    Answer:   Immediate

## 2020-11-07 ENCOUNTER — Ambulatory Visit (INDEPENDENT_AMBULATORY_CARE_PROVIDER_SITE_OTHER): Payer: Medicare Other

## 2020-11-07 DIAGNOSIS — I255 Ischemic cardiomyopathy: Secondary | ICD-10-CM

## 2020-11-07 LAB — CUP PACEART REMOTE DEVICE CHECK
Battery Remaining Longevity: 115 mo
Battery Voltage: 3.01 V
Brady Statistic RV Percent Paced: 0.02 %
Date Time Interrogation Session: 20221020012307
HighPow Impedance: 78 Ohm
Implantable Lead Implant Date: 20191031
Implantable Lead Location: 753860
Implantable Lead Model: 6935
Implantable Pulse Generator Implant Date: 20191031
Lead Channel Impedance Value: 399 Ohm
Lead Channel Impedance Value: 456 Ohm
Lead Channel Pacing Threshold Amplitude: 0.625 V
Lead Channel Pacing Threshold Pulse Width: 0.4 ms
Lead Channel Sensing Intrinsic Amplitude: 10.25 mV
Lead Channel Sensing Intrinsic Amplitude: 10.25 mV
Lead Channel Setting Pacing Amplitude: 2 V
Lead Channel Setting Pacing Pulse Width: 0.4 ms
Lead Channel Setting Sensing Sensitivity: 0.45 mV

## 2020-11-13 ENCOUNTER — Other Ambulatory Visit (HOSPITAL_COMMUNITY): Payer: Self-pay

## 2020-11-13 ENCOUNTER — Other Ambulatory Visit (HOSPITAL_COMMUNITY): Payer: Self-pay | Admitting: Cardiology

## 2020-11-13 MED ORDER — FENOFIBRATE 145 MG PO TABS
145.0000 mg | ORAL_TABLET | Freq: Every day | ORAL | 1 refills | Status: DC
  Filled 2020-11-13: qty 90, 90d supply, fill #0
  Filled 2021-02-19: qty 90, 90d supply, fill #1

## 2020-11-13 MED FILL — Potassium Chloride Microencapsulated Crys ER Tab 20 mEq: ORAL | 90 days supply | Qty: 39 | Fill #2 | Status: AC

## 2020-11-13 MED FILL — Furosemide Tab 40 MG: ORAL | 84 days supply | Qty: 36 | Fill #2 | Status: AC

## 2020-11-14 ENCOUNTER — Other Ambulatory Visit: Payer: Self-pay

## 2020-11-14 ENCOUNTER — Other Ambulatory Visit (HOSPITAL_COMMUNITY): Payer: Self-pay

## 2020-11-14 ENCOUNTER — Ambulatory Visit (HOSPITAL_COMMUNITY)
Admission: RE | Admit: 2020-11-14 | Discharge: 2020-11-14 | Disposition: A | Discharge status: Home / Self Care | Payer: Medicare Other | Source: Ambulatory Visit | Attending: Cardiology | Admitting: Cardiology

## 2020-11-14 DIAGNOSIS — R7989 Other specified abnormal findings of blood chemistry: Secondary | ICD-10-CM | POA: Insufficient documentation

## 2020-11-14 LAB — HEPATIC FUNCTION PANEL
ALT: 52 U/L — ABNORMAL HIGH (ref 0–44)
AST: 36 U/L (ref 15–41)
Albumin: 4 g/dL (ref 3.5–5.0)
Alkaline Phosphatase: 59 U/L (ref 38–126)
Bilirubin, Direct: 0.1 mg/dL (ref 0.0–0.2)
Indirect Bilirubin: 0.8 mg/dL (ref 0.3–0.9)
Total Bilirubin: 0.9 mg/dL (ref 0.3–1.2)
Total Protein: 6.6 g/dL (ref 6.5–8.1)

## 2020-11-14 NOTE — Progress Notes (Signed)
Remote ICD transmission.   

## 2020-11-26 NOTE — Progress Notes (Signed)
Electrophysiology Office Note Date: 11/27/2020  ID:  ETHIN DRUMMOND, DOB 07-17-1975, MRN [id number]  PCP: Arvilla Market, MD Primary Cardiologist: Rollene Rotunda, MD Electrophysiologist: Regan Lemming, MD   CC: Routine ICD follow-up  Joshua May is a 45 y.o. male seen today for Will [name], MD for routine electrophysiology followup after long absence (Last seen telehealth 2020).   Since last being seen in our clinic the patient reports doing well. At CHF clinic visit 08/2020 reported no DOE on flat ground, able to walk [employer] or similar without issue. Denied orthopnea/PND. he denies chest pain, palpitations, dyspnea, PND, orthopnea, nausea, vomiting, dizziness, syncope, edema, weight gain, or early satiety. He has not had ICD shocks.   Device History: Medtronic Single Chamber ICD implanted 2019 for CHF  Past Medical History:  Diagnosis Date   Asthma    CAD (coronary artery disease)    Chronic systolic CHF (congestive heart failure) (HCC)    HLD (hyperlipidemia)    Qualifier: Diagnosis of  By: Antoine Poche, MD, Gerrit Heck     Hypercholesteremia    Inferior myocardial infarction [facility]) 2007   PCI of RCA   Inferior myocardial infarction (HCC) 03/31/2008   PCI of RCA with bare metal stent   Ischemic cardiomyopathy    Migraines    and dizziness   Mitral regurgitation    Obstructive sleep apnea    Qualifier: Diagnosis of  By: Antoine Poche, MD, FACC, James     Posterolateral myocardial infarction (HCC) 10/20/2015   PCI of LCx using DES   Pulmonary embolism (HCC) 02/28/2017   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   Carpentier-McCarthy Adams ring annuloplasty,   size 26     Tobacco abuse    Past Surgical History:  Procedure Laterality Date   CARDIAC CATHETERIZATION N/A 10/20/2015   Procedure: Left Heart Cath and Coronary Angiography;  Surgeon: Kathleene Hazel, MD;  Location: [facility] INVASIVE CV LAB;  Service: Cardiovascular;  Laterality: N/A;   CARDIAC  CATHETERIZATION N/A 10/20/2015   Procedure: Coronary Stent Intervention;  Surgeon: Kathleene Hazel, MD;  Location: [facility] INVASIVE CV LAB;  Service: Cardiovascular;  Laterality: N/A;   ICD IMPLANT N/A 11/18/2017   Procedure: ICD IMPLANT;  Surgeon: Regan Lemming, MD;  Location: [facility] INVASIVE CV LAB;  Service: Cardiovascular;  Laterality: N/A;   INTRAVASCULAR PRESSURE WIRE/FFR STUDY N/A 03/03/2017   Procedure: INTRAVASCULAR PRESSURE WIRE/FFR STUDY;  Surgeon: Marykay Lex, MD;  Location: [facility] INVASIVE CV LAB;  Service: Cardiovascular;  Laterality: N/A;   LEFT HEART CATH AND CORONARY ANGIOGRAPHY N/A 03/03/2017   Procedure: LEFT HEART CATH AND CORONARY ANGIOGRAPHY;  Surgeon: Marykay Lex, MD;  Location: [facility] INVASIVE CV LAB;  Service: Cardiovascular;  Laterality: N/A;   MITRAL VALVE REPAIR Right 07/07/2017   Procedure: MINIMALLY INVASIVE MITRAL VALVE REPAIR (MVR) using Carpentier-McCarthy Adams Ring size 26;  Surgeon: Purcell Nails, MD;  Location: [facility] OR;  Service: Open Heart Surgery;  Laterality: Right;   MULTIPLE EXTRACTIONS WITH ALVEOLOPLASTY N/A 05/13/2017   Procedure: Extraction of tooth #'s [phone] with alveoloplasty and gross debridement of remaining teeth.;  Surgeon: Charlynne Pander, DDS;  Location: [facility] OR;  Service: Oral Surgery;  Laterality: N/A;   none     OTHER SURGICAL HISTORY     NONE   PATENT FORAMEN OVALE(PFO) CLOSURE N/A 07/07/2017   Procedure: PATENT FORAMEN OVALE (PFO) CLOSURE;  Surgeon: Purcell Nails, MD;  Location: [facility] OR;  Service: Open Heart Surgery;  Laterality: N/A;  RIGHT HEART CATH N/A 09/22/2017   Procedure: RIGHT HEART CATH;  Surgeon: Laurey Morale, MD;  Location: [facility] INVASIVE CV LAB;  Service: Cardiovascular;  Laterality: N/A;   TEE WITHOUT CARDIOVERSION N/A 04/19/2017   Procedure: TRANSESOPHAGEAL ECHOCARDIOGRAM (TEE);  Surgeon: Laurey Morale, MD;  Location: [facility] ENDOSCOPY;  Service: Cardiovascular;  Laterality: N/A;   TEE WITHOUT CARDIOVERSION N/A  07/07/2017   Procedure: TRANSESOPHAGEAL ECHOCARDIOGRAM (TEE);  Surgeon: Purcell Nails, MD;  Location: [facility] OR;  Service: Open Heart Surgery;  Laterality: N/A;    Current Outpatient Medications  Medication Sig Dispense Refill   albuterol (VENTOLIN HFA) 108 (90 Base) MCG/ACT inhaler Inhale 2 puffs into the lungs every 6 (six) hours as needed for wheezing or shortness of breath. 18 g 1   apixaban (ELIQUIS) 5 MG TABS tablet TAKE 1 TABLET (5 MG TOTAL) BY MOUTH 2 (TWO) TIMES DAILY. 180 tablet 3   atorvastatin (LIPITOR) 80 MG tablet TAKE 1 TABLET (80 MG TOTAL) BY MOUTH DAILY. 180 tablet 3   carvedilol (COREG) 12.5 MG tablet TAKE 1 TABLET (12.5 MG TOTAL) BY MOUTH 2 (TWO) TIMES DAILY. 180 tablet 3   cyclobenzaprine (FLEXERIL) 5 MG tablet Take 1 tablet (5 mg total) by mouth 3 (three) times daily as needed for muscle spasms. 14 tablet 0   empagliflozin (JARDIANCE) 10 MG TABS tablet TAKE 1 TABLET (10 MG TOTAL) BY MOUTH DAILY BEFORE BREAKFAST. 90 tablet 3   ezetimibe (ZETIA) 10 MG tablet Take 1 tablet (10 mg total) by mouth daily. 30 tablet 11   fenofibrate (TRICOR) 145 MG tablet Take 1 tablet (145 mg total) by mouth daily. 90 tablet 1   ferrous sulfate 325 (65 FE) MG tablet Take 325 mg by mouth daily.     folic acid (FOLVITE) 1 MG tablet Take 1 mg by mouth daily.     furosemide (LASIX) 40 MG tablet TAKE 1 TABLET (40 MG TOTAL) BY MOUTH AS DIRECTED. EVERY MONDAY, WEDNESDAY, FRIDAY 60 tablet 3   potassium chloride SA (KLOR-CON) 20 MEQ tablet TAKE 1 TABLET (20 MEQ TOTAL) BY MOUTH AS DIRECTED. EVERY MONDAY, WEDNESDAY, FRIDAY 60 tablet 3   sacubitril-valsartan (ENTRESTO) 97-103 MG TAKE 1 TABLET BY MOUTH 2 (TWO) TIMES DAILY. 180 tablet 3   spironolactone (ALDACTONE) 25 MG tablet TAKE 1 TABLET (25 MG TOTAL) BY MOUTH EVERY MORNING 90 tablet 3   tiotropium (SPIRIVA HANDIHALER) 18 MCG inhalation capsule Place 1 capsule (18 mcg total) into inhaler and inhale daily. 30 capsule 12   No current facility-administered  medications for this visit.    Allergies:   Shellfish allergy   Social History: Social History   Socioeconomic History   Marital status: Married    Spouse name: Not on file   Number of children: Not on file   Years of education: Not on file   Highest education level: Not on file  Occupational History   Not on file  Tobacco Use   Smoking status: Former    Packs/day: 0.50    Years: 17.00    Pack years: 8.50    Types: Cigarettes   Smokeless tobacco: Never   Tobacco comments:    quit since heart attack in 10/2015  Vaping Use   Vaping Use: Never used  Substance and Sexual Activity   Alcohol use: Not Currently   Drug use: Not Currently    Types: Marijuana   Sexual activity: Not on file  Other Topics Concern   Not on file  Social History Narrative  The patient is married with two children.   Social Determinants of Health   Financial Resource Strain: Not on file  Food Insecurity: Not on file  Transportation Needs: Not on file  Physical Activity: Not on file  Stress: Not on file  Social Connections: Not on file  Intimate Partner Violence: Not on file    Family History: Family History  Problem Relation Age of Onset   Heart attack Sister 78       S/P CABG   Heart attack Father    Deep vein thrombosis Father     Review of Systems: All other systems reviewed and are otherwise negative except as noted above.   Physical Exam: Vitals:   11/27/20 0847  BP: 118/70  Pulse: 77  SpO2: 97%  Weight: 276 lb (125.2 kg)  Height: 5\' 11"  (1.803 m)     GEN- The patient is well appearing, alert and oriented x 3 today.   HEENT: normocephalic, atraumatic; sclera clear, conjunctiva pink; hearing intact; oropharynx clear; neck supple, no JVP Lymph- no cervical lymphadenopathy Lungs- Clear to ausculation bilaterally, normal work of breathing.  No wheezes, rales, rhonchi Heart- Regular rate and rhythm, no murmurs, rubs or gallops, PMI not laterally displaced GI- soft,  non-tender, non-distended, bowel sounds present, no hepatosplenomegaly Extremities- no clubbing or cyanosis. No edema; DP/PT/radial pulses 2+ bilaterally MS- no significant deformity or atrophy Skin- warm and dry, no rash or lesion; ICD pocket well healed Psych- euthymic mood, full affect Neuro- strength and sensation are intact  ICD interrogation- reviewed in detail today,  See PACEART report  EKG:  EKG is not ordered today. Personal review of EKG ordered  08/23/2020  shows NSR at 80 bpm, normal intervals  Recent Labs: 03/26/2020: Hemoglobin 13.7; Platelets 292 08/23/2020: B Natriuretic Peptide 54.3; BUN 16; Creatinine, Ser 1.11; Potassium 3.8; Sodium 138 11/14/2020: ALT 52   Wt Readings from Last 3 Encounters:  11/27/20 276 lb (125.2 kg)  08/23/20 278 lb 3.2 oz (126.2 kg)  07/29/20 278 lb (126.1 kg)     Other studies Reviewed: Additional studies/ records that were reviewed today include: Previous EP office notes.   Assessment and Plan:  1.  Chronic systolic dysfunction s/p Medtronic single chamber ICD  NYHA II-III symptoms chronically, confounded by COPD euvolemic today Stable on an appropriate medical regimen Normal ICD function See Pace Art report No changes today  2. CAD: Stents in LCx and RCA. Denies s/s ischemia.   LHC 02/2017 showed nonobstructive disease.   3. H/o spontaneous PE:  On Eliquis.   4. Mitral regurgitation: Ischemic MR.  S/p minimally invasive MV repair in 6/19 Post-op echo 6/19 showed no MR but mean gradient 9 mmHg across MV. Echo (9/19) with mean MV gradient 6 mmHg but PHT short at 104 msec, probably no significant stenosis.  Echo in 8/21 showed mean gradient 5 mmHg across repaired MV with MVA 1.78 cm^2.  Suspect mild mitral stenosis, only trivial MR.   6. OSA: Still awaiting CPAP.     Current medicines are reviewed at length with the patient today.    Disposition:   Follow up with Dr. 9/21 in 12 months   Signed, Elberta Fortis, PA-C   11/27/2020 8:57 AM  [facility] [address] [place] [phone] (office) [phone] (fax)

## 2020-11-27 ENCOUNTER — Ambulatory Visit (INDEPENDENT_AMBULATORY_CARE_PROVIDER_SITE_OTHER): Payer: Medicare Other | Admitting: Student

## 2020-11-27 ENCOUNTER — Encounter: Payer: Self-pay | Admitting: Student

## 2020-11-27 ENCOUNTER — Other Ambulatory Visit (HOSPITAL_COMMUNITY): Payer: Self-pay

## 2020-11-27 ENCOUNTER — Other Ambulatory Visit: Payer: Self-pay

## 2020-11-27 VITALS — BP 118/70 | HR 77 | Ht 71.0 in | Wt 276.0 lb

## 2020-11-27 DIAGNOSIS — I255 Ischemic cardiomyopathy: Secondary | ICD-10-CM | POA: Diagnosis not present

## 2020-11-27 DIAGNOSIS — I2510 Atherosclerotic heart disease of native coronary artery without angina pectoris: Secondary | ICD-10-CM | POA: Diagnosis not present

## 2020-11-27 DIAGNOSIS — I5022 Chronic systolic (congestive) heart failure: Secondary | ICD-10-CM

## 2020-11-27 DIAGNOSIS — G4733 Obstructive sleep apnea (adult) (pediatric): Secondary | ICD-10-CM

## 2020-11-27 LAB — CUP PACEART INCLINIC DEVICE CHECK
Battery Remaining Longevity: 115 mo
Battery Voltage: 3.01 V
Brady Statistic RV Percent Paced: 0.02 %
Date Time Interrogation Session: 20221109085617
HighPow Impedance: 74 Ohm
Implantable Lead Implant Date: 20191031
Implantable Lead Location: 753860
Implantable Lead Model: 6935
Implantable Pulse Generator Implant Date: 20191031
Lead Channel Impedance Value: 399 Ohm
Lead Channel Impedance Value: 475 Ohm
Lead Channel Pacing Threshold Amplitude: 0.625 V
Lead Channel Pacing Threshold Pulse Width: 0.4 ms
Lead Channel Sensing Intrinsic Amplitude: 10.75 mV
Lead Channel Sensing Intrinsic Amplitude: 11.125 mV
Lead Channel Setting Pacing Amplitude: 2 V
Lead Channel Setting Pacing Pulse Width: 0.4 ms
Lead Channel Setting Sensing Sensitivity: 0.45 mV

## 2020-11-27 MED FILL — Empagliflozin Tab 10 MG: ORAL | 90 days supply | Qty: 90 | Fill #2 | Status: AC

## 2020-11-27 NOTE — Patient Instructions (Signed)
Medication Instructions:  Your physician recommends that you continue on your current medications as directed. Please refer to the Current Medication list given to you today.  *If you need a refill on your cardiac medications before your next appointment, please call your pharmacy*   Lab Work: None If you have labs (blood work) drawn today and your tests are completely normal, you will receive your results only by: MyChart Message (if you have MyChart) OR A paper copy in the mail If you have any lab test that is abnormal or we need to change your treatment, we will call you to review the results.  Follow-Up: At [facility], you and your health needs are our priority.  As part of our continuing mission to provide you with exceptional heart care, we have created designated Provider Care Teams.  These Care Teams include your primary Cardiologist (physician) and Advanced Practice Providers (APPs -  Physician Assistants and Nurse Practitioners) who all work together to provide you with the care you need, when you need it.   Your next appointment:   1 year(s)  The format for your next appointment:   In Person  Provider:   You may see Will Martin Camnitz, MD or one of the following Advanced Practice Providers on your designated Care Team:   Renee Ursuy, PA-C Michael "Andy" Tillery, PA-C   

## 2020-11-28 ENCOUNTER — Other Ambulatory Visit (HOSPITAL_COMMUNITY): Payer: Self-pay

## 2020-12-02 ENCOUNTER — Other Ambulatory Visit: Payer: Self-pay

## 2020-12-02 ENCOUNTER — Ambulatory Visit (HOSPITAL_COMMUNITY)
Admission: RE | Admit: 2020-12-02 | Discharge: 2020-12-02 | Disposition: A | Discharge status: Home / Self Care | Payer: Medicare Other | Source: Ambulatory Visit | Attending: Cardiology | Admitting: Cardiology

## 2020-12-02 ENCOUNTER — Encounter (HOSPITAL_COMMUNITY): Payer: Self-pay | Admitting: Cardiology

## 2020-12-02 ENCOUNTER — Ambulatory Visit (HOSPITAL_BASED_OUTPATIENT_CLINIC_OR_DEPARTMENT_OTHER)
Admission: RE | Admit: 2020-12-02 | Discharge: 2020-12-02 | Disposition: A | Discharge status: Home / Self Care | Payer: Medicare Other | Source: Ambulatory Visit | Attending: Cardiology | Admitting: Cardiology

## 2020-12-02 VITALS — BP 90/50 | HR 83 | Wt 279.2 lb

## 2020-12-02 DIAGNOSIS — Z7901 Long term (current) use of anticoagulants: Secondary | ICD-10-CM | POA: Insufficient documentation

## 2020-12-02 DIAGNOSIS — J449 Chronic obstructive pulmonary disease, unspecified: Secondary | ICD-10-CM | POA: Insufficient documentation

## 2020-12-02 DIAGNOSIS — Z79899 Other long term (current) drug therapy: Secondary | ICD-10-CM | POA: Diagnosis not present

## 2020-12-02 DIAGNOSIS — Z95810 Presence of automatic (implantable) cardiac defibrillator: Secondary | ICD-10-CM | POA: Insufficient documentation

## 2020-12-02 DIAGNOSIS — Z95818 Presence of other cardiac implants and grafts: Secondary | ICD-10-CM | POA: Insufficient documentation

## 2020-12-02 DIAGNOSIS — I2510 Atherosclerotic heart disease of native coronary artery without angina pectoris: Secondary | ICD-10-CM | POA: Diagnosis not present

## 2020-12-02 DIAGNOSIS — Z955 Presence of coronary angioplasty implant and graft: Secondary | ICD-10-CM | POA: Insufficient documentation

## 2020-12-02 DIAGNOSIS — E785 Hyperlipidemia, unspecified: Secondary | ICD-10-CM | POA: Insufficient documentation

## 2020-12-02 DIAGNOSIS — I5022 Chronic systolic (congestive) heart failure: Secondary | ICD-10-CM | POA: Insufficient documentation

## 2020-12-02 DIAGNOSIS — I255 Ischemic cardiomyopathy: Secondary | ICD-10-CM | POA: Diagnosis not present

## 2020-12-02 DIAGNOSIS — I110 Hypertensive heart disease with heart failure: Secondary | ICD-10-CM | POA: Diagnosis present

## 2020-12-02 DIAGNOSIS — I998 Other disorder of circulatory system: Secondary | ICD-10-CM | POA: Insufficient documentation

## 2020-12-02 DIAGNOSIS — I252 Old myocardial infarction: Secondary | ICD-10-CM | POA: Insufficient documentation

## 2020-12-02 DIAGNOSIS — F17200 Nicotine dependence, unspecified, uncomplicated: Secondary | ICD-10-CM | POA: Insufficient documentation

## 2020-12-02 DIAGNOSIS — G4733 Obstructive sleep apnea (adult) (pediatric): Secondary | ICD-10-CM | POA: Diagnosis not present

## 2020-12-02 DIAGNOSIS — Z7984 Long term (current) use of oral hypoglycemic drugs: Secondary | ICD-10-CM | POA: Diagnosis not present

## 2020-12-02 LAB — ECHOCARDIOGRAM COMPLETE
Area-P 1/2: 2.19 cm2
Calc EF: 45.2 %
MV VTI: 1.17 cm2
S' Lateral: 3.9 cm
Single Plane A2C EF: 39.3 %
Single Plane A4C EF: 45.6 %

## 2020-12-02 LAB — COMPREHENSIVE METABOLIC PANEL
ALT: 51 U/L — ABNORMAL HIGH (ref 0–44)
AST: 36 U/L (ref 15–41)
Albumin: 3.8 g/dL (ref 3.5–5.0)
Alkaline Phosphatase: 64 U/L (ref 38–126)
Anion gap: 7 (ref 5–15)
BUN: 14 mg/dL (ref 6–20)
CO2: 23 mmol/L (ref 22–32)
Calcium: 9.1 mg/dL (ref 8.9–10.3)
Chloride: 109 mmol/L (ref 98–111)
Creatinine, Ser: 1.03 mg/dL (ref 0.61–1.24)
GFR, Estimated: >60 mL/min (ref >60)
Glucose, Bld: 167 mg/dL — ABNORMAL HIGH (ref 70–99)
Potassium: 4 mmol/L (ref 3.5–5.1)
Sodium: 139 mmol/L (ref 135–145)
Total Bilirubin: 0.6 mg/dL (ref 0.3–1.2)
Total Protein: 6.6 g/dL (ref 6.5–8.1)

## 2020-12-02 NOTE — Progress Notes (Signed)
PCP: None  HF Cardiology: Dr Shirlee Latch  CT Surgery: Dr Cornelius Moras  HPI: Joshua May is a 45 y.o. male AA male with a history of CAD dating back to 2007 when he had a PCI in [place]. In 2010 he presented with a STEMI and had an RCA DES placed. He followed up for a year but then didn't present again till Oct 2017 when he presented with a CFX infarct. Cath revealed 50% ISR of the RCA, total mCFX and 85% OM3. He had CFX PCI with DES and OM3 POBA. His EF then was 50-55%.  Admitted 02/27/17 with increased dyspnea and chest pain. CTA confirmed PE. ECHO completed and showed reduced EF 20-25%. Underwent LHC as noted below. [place] no evidence DVTs. CT surgery consulted for severe MR. Plan to optimize HF medications.Set up TEE as an outpatient. Discharge weight 222 pounds.   Admitted 06/2017 with scheduled MV repair. S/P Minimally invasive mitral valve repair on 6/19. ECHO 07/12/17 post surgery, no MR was noted but EF remained 25-30%. Tachycardic at the time of discharge. Discharge weight 257 pounds.   CPX was submaximal in 8/19, suggestive of marked deconditioning.  RHC in 9/19 showed relatively preserved cardiac index with only mildly elevated filling pressures.  Echo in 9/19 showed EF 30-35%. MDT ICD placed in 2019.   Echo in 8/21 showed EF 45%,RV normal, s/p MV repair with mean gradient 5 and MVA 1.78 cm^2 (mild mitral stenosis), trivial MR.   Echo was done today and reviewed, EF 45-50%, s/p MV repair with no MR, moderate mitral stenosis with mean gradient 7 mmHg and MVA 1.12 by VTI, IVC normal, RV normal.   He returns today for followup of CHF and CAD.  He is generally doing well.  Weight is stable.  Still has not gotten CPAP.  Not smoking.  He is short of breath walking fast up stairs.  No dyspnea on flat ground.  No chest pain.  No orthopnea/PND.   Labs (7/19): K 4.5, creatinine 0.95, LDL 51 Labs (8/19): K 3.8, creatinine 0.99 Labs (5/21): K 4.8, creatinine 1.08, LDL 34, TGs 381 Labs (10/21): LDL 78, HDL 74, TGs  178 Labs (12/21): K 4.2, creatinine 1.3 Labs (3/22): K 3.7, creatinine 1.12 Labs (4/22): LDL 89 Labs (10/22): AST 36, ALT 52, LDL 74, TGs 107  PMH: 1. CAD: Initial PCI in 2007 in [place], inferior MI.   - Inferior STEMI 2010 with RCA PCI.  - CFx infarct in 2017 with LCx DES and OM3 POBA.  - LHC (2/19): Nonobstructive CAD.  2. 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  - TEE (4/19): LVEF 35-40%, Severe MR with PISA ERO 0.43 cm^2. Suspect infarct related MR with tethering of the posterior leaflet.  - Minimally invasive MV repair in 6/19.   3. Chronic systolic CHF: Ischemic cardiomyopathy. Medtronic ICD.  - Echo (2/19): EF 25-30%, diffuse HK with inferolateral AK, mildly decreased RV systolic function with D-shaped septum, moderate-severe infarct-related MR.  - Echo (6/19): EF 25-30%, apical and inferolateral hypokinesis, MV repair with mean gradient 9 mmHg with no MR, mildly decreased RV systolic function.  - CPX (8/19): VO2 13.2, VE/VCO2 slope 28, RER 0.87.  This study was suggestive of severe deconditioning.  - RHC (9/19): mean RA 9, PA 39/23 mean 24, mean PCWP 17, CI 2.2, PVR 1.35 WU - Echo (9/19): EF 30-35%, diffuse hypokinesis, normal RV, s/p MV repair with mean gradient 6 mmHg and PHT 104 msec => probably no significant stenosis.  - Echo (8/21): EF 45%, RV  normal, s/p MV repair with mean gradient 5 and MVA 1.78 cm^2 (mild mitral stenosis), trivial MR.  - Echo (11/22): EF 45-50%, s/p MV repair with no MR, moderate mitral stenosis with mean gradient 7 mmHg and MVA 1.12 by VTI, IVC normal, RV normal.  4. Hyperlipidemia 5. PE: 2/19.  6. COPD: Moderate COPD on 6/19 PFTs.   Review of systems complete and found to be negative unless listed in HPI.    Social History   Socioeconomic History   Marital status: Married    Spouse name: Not on file   Number of children: Not on file   Years of education: Not on file   Highest education level: Not on file  Occupational History   Not on file   Tobacco Use   Smoking status: Former    Packs/day: 0.50    Years: 17.00    Pack years: 8.50    Types: Cigarettes   Smokeless tobacco: Never   Tobacco comments:    quit since heart attack in 10/2015  Vaping Use   Vaping Use: Never used  Substance and Sexual Activity   Alcohol use: Not Currently   Drug use: Not Currently    Types: Marijuana   Sexual activity: Not on file  Other Topics Concern   Not on file  Social History Narrative   The patient is married with two children.   Social Determinants of Health   Financial Resource Strain: Not on file  Food Insecurity: Not on file  Transportation Needs: Not on file  Physical Activity: Not on file  Stress: Not on file  Social Connections: Not on file  Intimate Partner Violence: Not on file   Family History  Problem Relation Age of Onset   Heart attack Sister 72       S/P CABG   Heart attack Father    Deep vein thrombosis Father     Current Outpatient Medications  Medication Sig Dispense Refill   albuterol (VENTOLIN HFA) 108 (90 Base) MCG/ACT inhaler Inhale 2 puffs into the lungs every 6 (six) hours as needed for wheezing or shortness of breath. 18 g 1   apixaban (ELIQUIS) 5 MG TABS tablet TAKE 1 TABLET (5 MG TOTAL) BY MOUTH 2 (TWO) TIMES DAILY. 180 tablet 3   atorvastatin (LIPITOR) 80 MG tablet TAKE 1 TABLET (80 MG TOTAL) BY MOUTH DAILY. 180 tablet 3   carvedilol (COREG) 12.5 MG tablet TAKE 1 TABLET (12.5 MG TOTAL) BY MOUTH 2 (TWO) TIMES DAILY. 180 tablet 3   cyclobenzaprine (FLEXERIL) 5 MG tablet Take 1 tablet (5 mg total) by mouth 3 (three) times daily as needed for muscle spasms. 14 tablet 0   empagliflozin (JARDIANCE) 10 MG TABS tablet TAKE 1 TABLET (10 MG TOTAL) BY MOUTH DAILY BEFORE BREAKFAST. 90 tablet 3   ezetimibe (ZETIA) 10 MG tablet Take 1 tablet (10 mg total) by mouth daily. 30 tablet 11   fenofibrate (TRICOR) 145 MG tablet Take 1 tablet (145 mg total) by mouth daily. 90 tablet 1   ferrous sulfate 325 (65 FE) MG  tablet Take 325 mg by mouth daily.     folic acid (FOLVITE) 1 MG tablet Take 1 mg by mouth daily.     furosemide (LASIX) 40 MG tablet TAKE 1 TABLET (40 MG TOTAL) BY MOUTH AS DIRECTED. EVERY MONDAY, WEDNESDAY, FRIDAY 60 tablet 3   potassium chloride SA (KLOR-CON) 20 MEQ tablet TAKE 1 TABLET (20 MEQ TOTAL) BY MOUTH AS DIRECTED. EVERY MONDAY, WEDNESDAY, FRIDAY 60 tablet  3   sacubitril-valsartan (ENTRESTO) 97-103 MG TAKE 1 TABLET BY MOUTH 2 (TWO) TIMES DAILY. 180 tablet 3   spironolactone (ALDACTONE) 25 MG tablet TAKE 1 TABLET (25 MG TOTAL) BY MOUTH EVERY MORNING 90 tablet 3   tiotropium (SPIRIVA HANDIHALER) 18 MCG inhalation capsule Place 1 capsule (18 mcg total) into inhaler and inhale daily. 30 capsule 12   No current facility-administered medications for this encounter.   Vitals:   12/02/20 1157  BP: (!) 90/50  Pulse: 83  SpO2: 96%  Weight: 126.6 kg (279 lb 3.2 oz)    Wt Readings from Last 3 Encounters:  12/02/20 126.6 kg (279 lb 3.2 oz)  11/27/20 125.2 kg (276 lb)  08/23/20 126.2 kg (278 lb 3.2 oz)    PHYSICAL EXAM: General: NAD Neck: No JVD, no thyromegaly or thyroid nodule.  Lungs: Clear to auscultation bilaterally with normal respiratory effort. CV: Nondisplaced PMI.  Heart regular S1/S2, no S3/S4, no murmur.  No peripheral edema.  No carotid bruit.  Normal pedal pulses.  Abdomen: Soft, nontender, no hepatosplenomegaly, no distention.  Skin: Intact without lesions or rashes.  Neurologic: Alert and oriented x 3.  Psych: Normal affect. Extremities: No clubbing or cyanosis.  HEENT: Normal.   ASSESSMENT & PLAN: 1. Chronic systolic CHF: Ischemic cardiomyopathy. Echo in 6/19 with EF 25-30%, echo 9/19 with EF 30-35%.  Medtronic ICD.  Echo in 8/21 showed EF up to 45%.  Echo today showed EF 45-50%.  He is not volume overloaded on exam. NYHA class II symptoms, suspect symptoms are more due to COPD than to CHF.   - Continue spironolactone 25 mg daily.   - Continue Coreg 12.5 mg bid, BP  on the lower side, will not increase.   - Continue Entresto 97/103 bid, BP on the lower side, will not increase.   - Continue Lasix 40 mg tiw.  BMET today.  - Continue empagliflozin 10 mg daily.  2. CAD: Stents in LCx and RCA. LHC 02/2017 showed nonobstructive disease. No chest pain.   - Continue atorvastatin 80 mg daily, good lipids in 10/22.  - He is on warfarin so no ASA 81.  3. PE: In 2019, no definite trigger. Father with h/o VTE.  He should likely have long-term anticoagulation.   - Continue Eliquis.  4. Mitral regurgitation: Ischemic MR.  S/p minimally invasive MV repair in 6/19. Post-op echo 6/19 showed no MR but mean gradient 9 mmHg across MV. Echo (9/19) with mean MV gradient 6 mmHg but PHT short at 104 msec, probably no significant stenosis.  Echo in 8/21 showed mean gradient 5 mmHg across repaired MV with MVA 1.78 cm^2.  Echo today showed mean gradient 7 mmHg across MV with MVA 1.12 cm^2, possible moderate mitral stenosis.  Only trivial MR.  5. COPD: I think this is a major source for dyspnea.   6. Hyperlipidemia:  - Continue atorvastatin 80 mg daily.  - Continue Zetia 10 mg daily.   7. OSA: Waiting to get CPAP.     Followup in 6 months.   Marca Ancona 12/02/2020

## 2020-12-02 NOTE — Patient Instructions (Signed)
Labs done today, your results will be available in MyChart, we will contact you for abnormal readings.  Your physician recommends that you schedule a follow-up appointment in: 6 months (May 2023), **PLEASE CALL OUR OFFICE IN MARCH TO SCHEDULE THIS APPOINTMENT  If you have any questions or concerns before your next appointment please send us a message through mychart or call our office at [phone].    TO LEAVE A MESSAGE FOR THE NURSE SELECT OPTION 2, PLEASE LEAVE A MESSAGE INCLUDING: YOUR NAME DATE OF BIRTH CALL BACK NUMBER REASON FOR CALL**this is important as we prioritize the call backs  YOU WILL RECEIVE A CALL BACK THE SAME DAY AS LONG AS YOU CALL BEFORE 4:00 PM  At the [facility], you and your health needs are our priority. As part of our continuing mission to provide you with exceptional heart care, we have created designated Provider Care Teams. These Care Teams include your primary Cardiologist (physician) and Advanced Practice Providers (APPs- Physician Assistants and Nurse Practitioners) who all work together to provide you with the care you need, when you need it.   You may see any of the following providers on your designated Care Team at your next follow up: Dr Daniel Bensimhon Dr Dalton McLean Amy Clegg, NP Brittainy Simmons, PA Jessica Milford,NP Lindsay Finch, PA Lauren Kemp, PharmD   Please be sure to bring in all your medications bottles to every appointment.    

## 2020-12-02 NOTE — Progress Notes (Signed)
  Echocardiogram 2D Echocardiogram has been performed.  Joshua May M 12/02/2020, 11:42 AM

## 2020-12-16 ENCOUNTER — Encounter (HOSPITAL_COMMUNITY): Payer: Medicare Other | Admitting: Cardiology

## 2020-12-25 ENCOUNTER — Other Ambulatory Visit (HOSPITAL_COMMUNITY): Payer: Self-pay

## 2020-12-25 MED FILL — Sacubitril-Valsartan Tab 97-103 MG: ORAL | 90 days supply | Qty: 180 | Fill #2 | Status: AC

## 2020-12-27 ENCOUNTER — Telehealth: Payer: Self-pay | Admitting: Student

## 2020-12-27 ENCOUNTER — Telehealth: Payer: Self-pay

## 2020-12-27 NOTE — Telephone Encounter (Signed)
  1. Has your device fired? no  2. Is you device beeping? yes  3. Are you experiencing draining or swelling at device site? no  4. Are you calling to see if we received your device transmission? no  5. Have you passed out? no  Patient states it's been beeping when he gets up in the middle of the night.  States he's recently been using a C-Pap machine wondering if that has something to do with it.    Please route to Device Clinic Pool

## 2020-12-27 NOTE — Telephone Encounter (Signed)
Discussed with patient,   He recently got a new CPAP mask which has magnets in it.  States he is being awakened by his device emitting a tone.  Advised device will emit a tone if he is contact with magnets.  His device cannot function properly with the current CPAP mask.  He will contact CPAP provider to obtain a new mask.

## 2020-12-27 NOTE — Telephone Encounter (Signed)
Spoke with pt wife, she states that [facility] advised them that it is not the magnets in the mask causing this.    Patient is getting magnet response that he was not getting prior to receiving new mask.    Pt spouse requested I contact [facility] and advise.  Spoke with Victorino Dike, advised that patient device is indicating a magnet response when he uses the current CPAP mask, this response will prevent tachy therapy therefore mask needs to be changed.

## 2020-12-27 NOTE — Telephone Encounter (Signed)
Pt wife states the cpap magnet is not making his icd beep. I let her speak with Amy, rn.

## 2021-01-15 ENCOUNTER — Other Ambulatory Visit (HOSPITAL_COMMUNITY): Payer: Self-pay

## 2021-01-15 MED FILL — Spironolactone Tab 25 MG: ORAL | 90 days supply | Qty: 90 | Fill #3 | Status: AC

## 2021-01-15 MED FILL — Apixaban Tab 5 MG: ORAL | 90 days supply | Qty: 180 | Fill #2 | Status: AC

## 2021-01-16 ENCOUNTER — Other Ambulatory Visit (HOSPITAL_COMMUNITY): Payer: Self-pay

## 2021-01-31 ENCOUNTER — Other Ambulatory Visit (HOSPITAL_COMMUNITY): Payer: Self-pay

## 2021-01-31 MED FILL — Furosemide Tab 40 MG: ORAL | 84 days supply | Qty: 36 | Fill #3 | Status: AC

## 2021-01-31 MED FILL — Atorvastatin Calcium Tab 80 MG (Base Equivalent): ORAL | 90 days supply | Qty: 90 | Fill #3 | Status: AC

## 2021-01-31 MED FILL — Potassium Chloride Microencapsulated Crys ER Tab 20 mEq: ORAL | 90 days supply | Qty: 39 | Fill #3 | Status: AC

## 2021-01-31 MED FILL — Carvedilol Tab 12.5 MG: ORAL | 90 days supply | Qty: 180 | Fill #2 | Status: AC

## 2021-02-06 ENCOUNTER — Ambulatory Visit (INDEPENDENT_AMBULATORY_CARE_PROVIDER_SITE_OTHER): Payer: Medicare Other

## 2021-02-06 DIAGNOSIS — I5022 Chronic systolic (congestive) heart failure: Secondary | ICD-10-CM | POA: Diagnosis not present

## 2021-02-06 LAB — CUP PACEART REMOTE DEVICE CHECK
Battery Remaining Longevity: 111 mo
Battery Voltage: 3.01 V
Brady Statistic RV Percent Paced: 0.02 %
Date Time Interrogation Session: 20230119033321
HighPow Impedance: 74 Ohm
Implantable Lead Implant Date: 20191031
Implantable Lead Location: 753860
Implantable Lead Model: 6935
Implantable Pulse Generator Implant Date: 20191031
Lead Channel Impedance Value: 361 Ohm
Lead Channel Impedance Value: 418 Ohm
Lead Channel Pacing Threshold Amplitude: 0.625 V
Lead Channel Pacing Threshold Pulse Width: 0.4 ms
Lead Channel Sensing Intrinsic Amplitude: 10.875 mV
Lead Channel Sensing Intrinsic Amplitude: 10.875 mV
Lead Channel Setting Pacing Amplitude: 2 V
Lead Channel Setting Pacing Pulse Width: 0.4 ms
Lead Channel Setting Sensing Sensitivity: 0.45 mV

## 2021-02-18 NOTE — Progress Notes (Signed)
Remote ICD transmission.   

## 2021-02-19 ENCOUNTER — Other Ambulatory Visit (HOSPITAL_COMMUNITY): Payer: Self-pay

## 2021-02-27 ENCOUNTER — Other Ambulatory Visit (HOSPITAL_COMMUNITY): Payer: Self-pay

## 2021-03-02 NOTE — Progress Notes (Signed)
Virtual Visit via Video Note   This visit type was conducted due to national recommendations for restrictions regarding the COVID-19 Pandemic (e.g. social distancing) in an effort to limit this patient's exposure and mitigate transmission in our community.  Due to his co-morbid illnesses, this patient is at least at moderate risk for complications without adequate follow up.  This format is felt to be most appropriate for this patient at this time.  All issues noted in this document were discussed and addressed.  A limited physical exam was performed with this format.  Please refer to the patient's chart for his consent to telehealth [facility].       Date:  03/03/2021   ID:  Joshua May, DOB 07/09/1975, MRN [id number] The patient was identified using 2 identifiers.  Patient Location: Home Provider Location: Home Office   PCP:  Arvilla MarketWallace, Catherine Lauren, MD   [facility] Providers Cardiologist:  Rollene RotundaJames Hochrein, MD Electrophysiologist:  Will Jorja LoaMartin Camnitz, MD     Evaluation Performed:  Follow-Up Visit  Chief Complaint:  OSA  History of Present Illness:    Joshua PoreKaleff H Altieri is a 46 y.o. male with with a history of CAD, chronic systolic CHF, hyperlipidemia, ischemic cardiomyopathy, pulmonary embolism and mitral valve repair.   He was referred for home sleep study by Dr. Marca Anconaalton McLean for CHF.  This showed severe obstructive sleep apnea with an AHI of 68.6/h and moderate central sleep apnea with an pAHIc of 19.1/h with 4.8% of the time noted to be in Cheyne-Stokes respirations.  He had nocturnal hypoxemia and spent 215 minutes with O2 saturations less than 88%.  The patient underwent CPAP titration but due to ongoing respiratory events he was changed to BiPAP and eventually an auto BiPAP with IPAP max 20 cm H2O, EPAP min 5 cm H2O and pressure support 4 cm H2O was ordered.  He is now back for follow-up.  He is doing well with his BiPAP device and thinks that he has gotten used  to it.  He tolerates the full face mask and feels the pressure is adequate.  Since going on BiPAP he feels rested in the am and has no significant daytime sleepiness.  He does admit to some problems with mouth and nasal dryness and sometimes some nasal congestion.     The patient does not have symptoms concerning for COVID-19 infection (fever, chills, cough, or new shortness of breath).    Past Medical History:  Diagnosis Date   Asthma    CAD (coronary artery disease)    Chronic systolic CHF (congestive heart failure) (HCC)    HLD (hyperlipidemia)    Qualifier: Diagnosis of  By: Antoine PocheHochrein, MD, Gerrit HeckFACC, James     Hypercholesteremia    Inferior myocardial infarction [facility](HCC) 2007   PCI of RCA   Inferior myocardial infarction (HCC) 03/31/2008   PCI of RCA with bare metal stent   Ischemic cardiomyopathy    Migraines    and dizziness   Mitral regurgitation    Obstructive sleep apnea    Qualifier: Diagnosis of  By: Antoine PocheHochrein, MD, FACC, James     Posterolateral myocardial infarction (HCC) 10/20/2015   PCI of LCx using DES   Pulmonary embolism (HCC) 02/28/2017   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   Carpentier-McCarthy Adams ring annuloplasty,   size 26     Tobacco abuse    Past Surgical History:  Procedure Laterality Date   CARDIAC CATHETERIZATION N/A 10/20/2015   Procedure: Left Heart Cath  and Coronary Angiography;  Surgeon: Kathleene Hazel, MD;  Location: [facility] INVASIVE CV LAB;  Service: Cardiovascular;  Laterality: N/A;   CARDIAC CATHETERIZATION N/A 10/20/2015   Procedure: Coronary Stent Intervention;  Surgeon: Kathleene Hazel, MD;  Location: [facility] INVASIVE CV LAB;  Service: Cardiovascular;  Laterality: N/A;   ICD IMPLANT N/A 11/18/2017   Procedure: ICD IMPLANT;  Surgeon: Regan Lemming, MD;  Location: [facility] INVASIVE CV LAB;  Service: Cardiovascular;  Laterality: N/A;   INTRAVASCULAR PRESSURE WIRE/FFR STUDY N/A 03/03/2017   Procedure: INTRAVASCULAR PRESSURE WIRE/FFR STUDY;   Surgeon: Marykay Lex, MD;  Location: [facility] INVASIVE CV LAB;  Service: Cardiovascular;  Laterality: N/A;   LEFT HEART CATH AND CORONARY ANGIOGRAPHY N/A 03/03/2017   Procedure: LEFT HEART CATH AND CORONARY ANGIOGRAPHY;  Surgeon: Marykay Lex, MD;  Location: [facility] INVASIVE CV LAB;  Service: Cardiovascular;  Laterality: N/A;   MITRAL VALVE REPAIR Right 07/07/2017   Procedure: MINIMALLY INVASIVE MITRAL VALVE REPAIR (MVR) using Carpentier-McCarthy Adams Ring size 26;  Surgeon: Purcell Nails, MD;  Location: [facility] OR;  Service: Open Heart Surgery;  Laterality: Right;   MULTIPLE EXTRACTIONS WITH ALVEOLOPLASTY N/A 05/13/2017   Procedure: Extraction of tooth #'s [phone] with alveoloplasty and gross debridement of remaining teeth.;  Surgeon: Charlynne Pander, DDS;  Location: [facility] OR;  Service: Oral Surgery;  Laterality: N/A;   none     OTHER SURGICAL HISTORY     NONE   PATENT FORAMEN OVALE(PFO) CLOSURE N/A 07/07/2017   Procedure: PATENT FORAMEN OVALE (PFO) CLOSURE;  Surgeon: Purcell Nails, MD;  Location: [facility] OR;  Service: Open Heart Surgery;  Laterality: N/A;   RIGHT HEART CATH N/A 09/22/2017   Procedure: RIGHT HEART CATH;  Surgeon: Laurey Morale, MD;  Location: [facility] INVASIVE CV LAB;  Service: Cardiovascular;  Laterality: N/A;   TEE WITHOUT CARDIOVERSION N/A 04/19/2017   Procedure: TRANSESOPHAGEAL ECHOCARDIOGRAM (TEE);  Surgeon: Laurey Morale, MD;  Location: [facility] ENDOSCOPY;  Service: Cardiovascular;  Laterality: N/A;   TEE WITHOUT CARDIOVERSION N/A 07/07/2017   Procedure: TRANSESOPHAGEAL ECHOCARDIOGRAM (TEE);  Surgeon: Purcell Nails, MD;  Location: [facility] OR;  Service: Open Heart Surgery;  Laterality: N/A;     Current Meds  Medication Sig   albuterol (VENTOLIN HFA) 108 (90 Base) MCG/ACT inhaler Inhale 2 puffs into the lungs every 6 (six) hours as needed for wheezing or shortness of breath.   apixaban (ELIQUIS) 5 MG TABS tablet TAKE 1 TABLET (5 MG TOTAL) BY MOUTH 2 (TWO) TIMES DAILY.   atorvastatin  (LIPITOR) 80 MG tablet TAKE 1 TABLET (80 MG TOTAL) BY MOUTH DAILY.   carvedilol (COREG) 12.5 MG tablet TAKE 1 TABLET (12.5 MG TOTAL) BY MOUTH 2 (TWO) TIMES DAILY.   cyclobenzaprine (FLEXERIL) 5 MG tablet Take 1 tablet (5 mg total) by mouth 3 (three) times daily as needed for muscle spasms.   empagliflozin (JARDIANCE) 10 MG TABS tablet TAKE 1 TABLET (10 MG TOTAL) BY MOUTH DAILY BEFORE BREAKFAST.   ezetimibe (ZETIA) 10 MG tablet Take 1 tablet (10 mg total) by mouth daily.   fenofibrate (TRICOR) 145 MG tablet Take 1 tablet (145 mg total) by mouth daily.   ferrous sulfate 325 (65 FE) MG tablet Take 325 mg by mouth daily.   folic acid (FOLVITE) 1 MG tablet Take 1 mg by mouth daily.   furosemide (LASIX) 40 MG tablet TAKE 1 TABLET (40 MG TOTAL) BY MOUTH AS DIRECTED. EVERY MONDAY, WEDNESDAY, FRIDAY   potassium chloride SA (KLOR-CON M) 20 MEQ tablet  TAKE 1 TABLET (20 MEQ TOTAL) BY MOUTH AS DIRECTED. EVERY MONDAY, WEDNESDAY, FRIDAY   sacubitril-valsartan (ENTRESTO) 97-103 MG TAKE 1 TABLET BY MOUTH 2 (TWO) TIMES DAILY.   spironolactone (ALDACTONE) 25 MG tablet TAKE 1 TABLET (25 MG TOTAL) BY MOUTH EVERY MORNING   tiotropium (SPIRIVA HANDIHALER) 18 MCG inhalation capsule Place 1 capsule (18 mcg total) into inhaler and inhale daily.     Allergies:   Shellfish allergy   Social History   Tobacco Use   Smoking status: Former    Packs/day: 0.50    Years: 17.00    Pack years: 8.50    Types: Cigarettes   Smokeless tobacco: Never   Tobacco comments:    quit since heart attack in 10/2015  Vaping Use   Vaping Use: Never used  Substance Use Topics   Alcohol use: Not Currently   Drug use: Not Currently    Types: Marijuana     Family Hx: The patient's family history includes Deep vein thrombosis in his father; Heart attack in his father; Heart attack (age of onset: 65) in his sister.  ROS:   Please see the history of present illness.     All other systems reviewed and are negative.   Prior CV  studies:   The following studies were reviewed today:  Home sleep study, CPAP titration, PAP download  Labs/Other Tests and Data Reviewed:    EKG:  No ECG reviewed.  Recent Labs: 03/26/2020: Hemoglobin 13.7; Platelets 292 08/23/2020: B Natriuretic Peptide 54.3 12/02/2020: ALT 51; BUN 14; Creatinine, Ser 1.03; Potassium 4.0; Sodium 139   Recent Lipid Panel Lab Results  Component Value Date/Time   CHOL 123 10/23/2020 02:10 PM   CHOL 136 04/24/2020 11:51 AM   TRIG 107 10/23/2020 02:10 PM   HDL 28 (L) 10/23/2020 02:10 PM   HDL 25 (L) 04/24/2020 11:51 AM   CHOLHDL 4.4 10/23/2020 02:10 PM   LDLCALC 74 10/23/2020 02:10 PM   LDLCALC 89 04/24/2020 11:51 AM   LDLDIRECT 149.8 11/15/2009 09:33 AM    Wt Readings from Last 3 Encounters:  03/03/21 272 lb (123.4 kg)  12/02/20 279 lb 3.2 oz (126.6 kg)  11/27/20 276 lb (125.2 kg)     Risk Assessment/Calculations:          Objective:    Vital Signs:  Ht 5\' 11"  (1.803 m)    Wt 272 lb (123.4 kg)    BMI 37.94 kg/m    VITAL SIGNS:  reviewed GEN:  no acute distress EYES:  sclerae anicteric, EOMI - Extraocular Movements Intact RESPIRATORY:  normal respiratory effort, symmetric expansion CARDIOVASCULAR:  no peripheral edema SKIN:  no rash, lesions or ulcers. MUSCULOSKELETAL:  no obvious deformities. NEURO:  alert and oriented x 3, no obvious focal deficit PSYCH:  normal affect  ASSESSMENT & PLAN:    OSA - The patient is tolerating PAP therapy well without any problems. The PAP download performed by his DME was personally reviewed and interpreted by me today and showed an AHI of 6.7 /hr on auto BiPAP  with 83% compliance in using more than 4 hours nightly.  The patient has been using and benefiting from PAP use and will continue to benefit from therapy.  -I will get an overnight pulse ox on BiPAP to make sure there is no residual hypoxemia    COVID-19 Education: The signs and symptoms of COVID-19 were discussed with the patient and how to  seek care for testing (follow up with PCP or arrange E-visit).  The importance of social distancing was discussed today.  Time:   Today, I have spent 15 minutes with the patient with telehealth technology discussing the above problems.     Medication Adjustments/Labs and Tests Ordered: Current medicines are reviewed at length with the patient today.  Concerns regarding medicines are outlined above.   Tests Ordered: No orders of the defined types were placed in this encounter.   Medication Changes: No orders of the defined types were placed in this encounter.   Follow Up:  Virtual in 3 months  Signed, Armanda Magic, MD  03/03/2021 10:46 AM    [facility]

## 2021-03-03 ENCOUNTER — Encounter: Payer: Self-pay | Admitting: Cardiology

## 2021-03-03 ENCOUNTER — Telehealth (INDEPENDENT_AMBULATORY_CARE_PROVIDER_SITE_OTHER): Payer: Medicare Other | Admitting: Cardiology

## 2021-03-03 ENCOUNTER — Telehealth: Payer: Self-pay | Admitting: *Deleted

## 2021-03-03 ENCOUNTER — Other Ambulatory Visit: Payer: Self-pay

## 2021-03-03 VITALS — Ht 71.0 in | Wt 272.0 lb

## 2021-03-03 DIAGNOSIS — G4733 Obstructive sleep apnea (adult) (pediatric): Secondary | ICD-10-CM

## 2021-03-03 NOTE — Telephone Encounter (Signed)
-----   Message from Theresia Majors, RN sent at 03/03/2021 11:01 AM EST ----- Per Dr. Mayford Knife: Please order PAP supplies and overnight pulse ox on BiPAP. please order a large under the nose full face mask - they sent him a medium the last time and it does not fit. Thanks!

## 2021-03-03 NOTE — Telephone Encounter (Signed)
Order placed to [facility] 

## 2021-03-18 ENCOUNTER — Ambulatory Visit: Payer: Medicare Other | Admitting: Cardiology

## 2021-03-18 DIAGNOSIS — G4733 Obstructive sleep apnea (adult) (pediatric): Secondary | ICD-10-CM

## 2021-03-26 ENCOUNTER — Other Ambulatory Visit (HOSPITAL_COMMUNITY): Payer: Self-pay

## 2021-03-26 ENCOUNTER — Other Ambulatory Visit (HOSPITAL_COMMUNITY): Payer: Self-pay | Admitting: Cardiology

## 2021-03-26 MED ORDER — ENTRESTO 97-103 MG PO TABS
1.0000 | ORAL_TABLET | Freq: Two times a day (BID) | ORAL | 3 refills | Status: DC
  Filled 2021-03-26: qty 180, 90d supply, fill #0
  Filled 2021-06-25: qty 180, 90d supply, fill #1
  Filled 2021-10-13: qty 180, 90d supply, fill #2
  Filled 2021-11-17 – 2022-01-15 (×2): qty 180, 90d supply, fill #3

## 2021-03-26 MED ORDER — EMPAGLIFLOZIN 10 MG PO TABS
10.0000 mg | ORAL_TABLET | Freq: Every day | ORAL | 3 refills | Status: DC
  Filled 2021-03-26: qty 90, 90d supply, fill #0
  Filled 2021-06-25: qty 90, 90d supply, fill #1
  Filled 2021-09-19: qty 90, 90d supply, fill #2
  Filled 2021-12-22: qty 90, 90d supply, fill #3

## 2021-03-27 ENCOUNTER — Other Ambulatory Visit (HOSPITAL_COMMUNITY): Payer: Self-pay

## 2021-04-17 ENCOUNTER — Other Ambulatory Visit (HOSPITAL_COMMUNITY): Payer: Self-pay

## 2021-04-17 ENCOUNTER — Other Ambulatory Visit (HOSPITAL_COMMUNITY): Payer: Self-pay | Admitting: Cardiology

## 2021-04-17 MED ORDER — APIXABAN 5 MG PO TABS
5.0000 mg | ORAL_TABLET | Freq: Two times a day (BID) | ORAL | 3 refills | Status: DC
  Filled 2021-04-17: qty 180, 90d supply, fill #0
  Filled 2021-07-24: qty 180, 90d supply, fill #1
  Filled 2021-10-29: qty 180, 90d supply, fill #2
  Filled 2022-02-03 – 2022-02-05 (×2): qty 180, 90d supply, fill #3

## 2021-04-18 NOTE — Research (Signed)
Open in error

## 2021-04-18 NOTE — Research (Signed)
Beat HF  Informed Consent   Subject Name: Joshua May  Subject met inclusion and exclusion criteria.  The informed consent form, study requirements and expectations were reviewed with the subject and questions and concerns were addressed prior to the signing of the consent form.  The subject verbalized understanding of the trial requirements.  The subject agreed to participate in the Beat HF trial and signed the informed consent on 10/04/2017.  The informed consent was obtained prior to performance of any protocol-specific procedures for the subject.  A copy of the signed informed consent was given to the subject and a copy was placed in the subject's medical record.   Mercer Pod D

## 2021-04-24 ENCOUNTER — Other Ambulatory Visit (HOSPITAL_COMMUNITY): Payer: Self-pay | Admitting: Cardiology

## 2021-04-24 ENCOUNTER — Other Ambulatory Visit (HOSPITAL_COMMUNITY): Payer: Self-pay

## 2021-04-24 MED ORDER — SPIRONOLACTONE 25 MG PO TABS
25.0000 mg | ORAL_TABLET | Freq: Every morning | ORAL | 1 refills | Status: DC
  Filled 2021-04-24: qty 60, 60d supply, fill #0

## 2021-04-29 ENCOUNTER — Other Ambulatory Visit (HOSPITAL_COMMUNITY): Payer: Self-pay

## 2021-04-29 ENCOUNTER — Other Ambulatory Visit (HOSPITAL_COMMUNITY): Payer: Self-pay | Admitting: *Deleted

## 2021-04-29 MED ORDER — SPIRONOLACTONE 25 MG PO TABS
25.0000 mg | ORAL_TABLET | Freq: Every morning | ORAL | 1 refills | Status: DC
  Filled 2021-04-29 – 2021-06-25 (×2): qty 30, 30d supply, fill #0
  Filled 2021-07-24: qty 30, 30d supply, fill #1

## 2021-05-05 ENCOUNTER — Other Ambulatory Visit (HOSPITAL_COMMUNITY): Payer: Self-pay

## 2021-05-05 ENCOUNTER — Other Ambulatory Visit (HOSPITAL_COMMUNITY): Payer: Self-pay | Admitting: Cardiology

## 2021-05-05 DIAGNOSIS — I5022 Chronic systolic (congestive) heart failure: Secondary | ICD-10-CM

## 2021-05-06 ENCOUNTER — Other Ambulatory Visit (HOSPITAL_COMMUNITY): Payer: Self-pay | Admitting: Cardiology

## 2021-05-06 ENCOUNTER — Other Ambulatory Visit (HOSPITAL_COMMUNITY): Payer: Self-pay

## 2021-05-06 ENCOUNTER — Other Ambulatory Visit: Payer: Self-pay

## 2021-05-06 DIAGNOSIS — I5022 Chronic systolic (congestive) heart failure: Secondary | ICD-10-CM

## 2021-05-06 MED ORDER — ATORVASTATIN CALCIUM 80 MG PO TABS
80.0000 mg | ORAL_TABLET | Freq: Every day | ORAL | 3 refills | Status: DC
  Filled 2021-05-06: qty 90, 90d supply, fill #0
  Filled 2021-08-08: qty 90, 90d supply, fill #1
  Filled 2021-10-29: qty 90, 90d supply, fill #2
  Filled 2022-02-03 – 2022-02-05 (×2): qty 90, 90d supply, fill #3

## 2021-05-07 ENCOUNTER — Other Ambulatory Visit (HOSPITAL_COMMUNITY): Payer: Self-pay | Admitting: Cardiology

## 2021-05-07 ENCOUNTER — Other Ambulatory Visit (HOSPITAL_COMMUNITY): Payer: Self-pay

## 2021-05-07 DIAGNOSIS — I5022 Chronic systolic (congestive) heart failure: Secondary | ICD-10-CM

## 2021-05-08 ENCOUNTER — Other Ambulatory Visit (HOSPITAL_COMMUNITY): Payer: Self-pay

## 2021-05-08 ENCOUNTER — Ambulatory Visit (INDEPENDENT_AMBULATORY_CARE_PROVIDER_SITE_OTHER): Payer: Medicare Other

## 2021-05-08 DIAGNOSIS — I255 Ischemic cardiomyopathy: Secondary | ICD-10-CM | POA: Diagnosis not present

## 2021-05-08 LAB — CUP PACEART REMOTE DEVICE CHECK
Battery Remaining Longevity: 108 mo
Battery Voltage: 3.01 V
Brady Statistic RV Percent Paced: 0.01 %
Date Time Interrogation Session: 20230420033424
HighPow Impedance: 73 Ohm
Implantable Lead Implant Date: 20191031
Implantable Lead Location: 753860
Implantable Lead Model: 6935
Implantable Pulse Generator Implant Date: 20191031
Lead Channel Impedance Value: 342 Ohm
Lead Channel Impedance Value: 418 Ohm
Lead Channel Pacing Threshold Amplitude: 0.625 V
Lead Channel Pacing Threshold Pulse Width: 0.4 ms
Lead Channel Sensing Intrinsic Amplitude: 11.625 mV
Lead Channel Sensing Intrinsic Amplitude: 11.625 mV
Lead Channel Setting Pacing Amplitude: 2 V
Lead Channel Setting Pacing Pulse Width: 0.4 ms
Lead Channel Setting Sensing Sensitivity: 0.45 mV

## 2021-05-08 MED ORDER — CARVEDILOL 12.5 MG PO TABS
12.5000 mg | ORAL_TABLET | Freq: Two times a day (BID) | ORAL | 3 refills | Status: DC
  Filled 2021-05-08: qty 180, 90d supply, fill #0
  Filled 2021-08-08: qty 180, 90d supply, fill #1
  Filled 2021-11-17: qty 180, 90d supply, fill #2
  Filled 2022-02-19: qty 180, 90d supply, fill #3

## 2021-05-08 MED ORDER — FUROSEMIDE 40 MG PO TABS
40.0000 mg | ORAL_TABLET | ORAL | 3 refills | Status: DC
  Filled 2021-05-08: qty 38, 89d supply, fill #0
  Filled 2021-07-24: qty 38, 89d supply, fill #1

## 2021-05-21 ENCOUNTER — Other Ambulatory Visit (HOSPITAL_COMMUNITY): Payer: Self-pay

## 2021-05-21 ENCOUNTER — Other Ambulatory Visit (HOSPITAL_COMMUNITY): Payer: Self-pay | Admitting: Cardiology

## 2021-05-21 MED ORDER — POTASSIUM CHLORIDE CRYS ER 20 MEQ PO TBCR
20.0000 meq | EXTENDED_RELEASE_TABLET | ORAL | 3 refills | Status: DC
  Filled 2021-05-21: qty 39, 90d supply, fill #0
  Filled 2021-09-03: qty 39, 90d supply, fill #1
  Filled 2021-11-26 (×2): qty 39, 90d supply, fill #2
  Filled 2022-03-09: qty 39, 90d supply, fill #3

## 2021-05-21 MED ORDER — FENOFIBRATE 145 MG PO TABS
145.0000 mg | ORAL_TABLET | Freq: Every day | ORAL | 3 refills | Status: DC
  Filled 2021-05-21: qty 90, 90d supply, fill #0
  Filled 2021-08-08: qty 90, 90d supply, fill #1
  Filled 2021-11-17: qty 90, 90d supply, fill #2
  Filled 2022-02-19: qty 90, 90d supply, fill #3

## 2021-05-23 NOTE — Progress Notes (Signed)
Remote ICD transmission.   

## 2021-06-17 ENCOUNTER — Encounter: Payer: Self-pay | Admitting: Emergency Medicine

## 2021-06-17 ENCOUNTER — Ambulatory Visit
Admission: EM | Admit: 2021-06-17 | Discharge: 2021-06-17 | Disposition: A | Discharge status: Home / Self Care | Payer: Medicare Other | Attending: Internal Medicine | Admitting: Internal Medicine

## 2021-06-17 ENCOUNTER — Other Ambulatory Visit (HOSPITAL_COMMUNITY): Payer: Self-pay

## 2021-06-17 DIAGNOSIS — J069 Acute upper respiratory infection, unspecified: Secondary | ICD-10-CM

## 2021-06-17 DIAGNOSIS — J029 Acute pharyngitis, unspecified: Secondary | ICD-10-CM

## 2021-06-17 DIAGNOSIS — Z20818 Contact with and (suspected) exposure to other bacterial communicable diseases: Secondary | ICD-10-CM | POA: Diagnosis present

## 2021-06-17 DIAGNOSIS — H109 Unspecified conjunctivitis: Secondary | ICD-10-CM

## 2021-06-17 DIAGNOSIS — B9689 Other specified bacterial agents as the cause of diseases classified elsewhere: Secondary | ICD-10-CM

## 2021-06-17 DIAGNOSIS — R051 Acute cough: Secondary | ICD-10-CM | POA: Insufficient documentation

## 2021-06-17 LAB — POCT RAPID STREP A (OFFICE): Rapid Strep A Screen: NEGATIVE

## 2021-06-17 MED ORDER — ERYTHROMYCIN 5 MG/GM OP OINT
TOPICAL_OINTMENT | OPHTHALMIC | 0 refills | Status: DC
  Filled 2021-06-17: qty 3.5, 7d supply, fill #0

## 2021-06-17 MED ORDER — AMOXICILLIN 500 MG PO CAPS
500.0000 mg | ORAL_CAPSULE | Freq: Two times a day (BID) | ORAL | 0 refills | Status: AC
  Filled 2021-06-17: qty 20, 10d supply, fill #0

## 2021-06-17 NOTE — ED Provider Notes (Signed)
[facility]    CSN: [id number] Arrival date & time: 06/17/21  1454      History   Chief Complaint Chief Complaint  Patient presents with   Sore Throat    HPI Joshua May is a 46 y.o. male.   Patient presents with cough, sore throat, nasal congestion, generalized body aches that have been present for a few days.  Patient reports that his daughter recently tested positive for strep throat today.  Denies any known fevers at home.  Denies chest pain, shortness of breath, ear pain, nausea, vomiting, diarrhea, abdominal pain.  Patient does not report taking medications for symptoms.  Patient also with concern for bilateral eye redness that started approximately 2 days ago.  He reports crustiness noted to eyes.  Denies any blurry vision.  Patient does not wear contacts or glasses.   Sore Throat   Past Medical History:  Diagnosis Date   Asthma    CAD (coronary artery disease)    Chronic systolic CHF (congestive heart failure) (HCC)    HLD (hyperlipidemia)    Qualifier: Diagnosis of  By: Antoine Poche, MD, Gerrit Heck     Hypercholesteremia    Inferior myocardial infarction [facility]) 2007   PCI of RCA   Inferior myocardial infarction (HCC) 03/31/2008   PCI of RCA with bare metal stent   Ischemic cardiomyopathy    Migraines    and dizziness   Mitral regurgitation    Obstructive sleep apnea    Qualifier: Diagnosis of  By: Antoine Poche, MD, FACC, James     Posterolateral myocardial infarction (HCC) 10/20/2015   PCI of LCx using DES   Pulmonary embolism (HCC) 02/28/2017   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   Carpentier-McCarthy Adams ring annuloplasty,   size 26     Tobacco abuse     Patient Active Problem List   Diagnosis Date Noted   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   Dental caries 05/04/2017   Chronic periodontitis 05/04/2017   Lupus anticoagulant disorder (HCC) 03/10/2017   Chronic systolic CHF (congestive heart failure) (HCC)    Mitral regurgitation     Ischemic cardiomyopathy    Pulmonary embolism (HCC) 02/28/2017   CAD (coronary artery disease)    DYSPHAGIA UNSPECIFIED 09/04/2008   HLD (hyperlipidemia) 04/27/2008   Obstructive sleep apnea 04/27/2008   ASTHMA 04/26/2008    Past Surgical History:  Procedure Laterality Date   CARDIAC CATHETERIZATION N/A 10/20/2015   Procedure: Left Heart Cath and Coronary Angiography;  Surgeon: Kathleene Hazel, MD;  Location: [facility] INVASIVE CV LAB;  Service: Cardiovascular;  Laterality: N/A;   CARDIAC CATHETERIZATION N/A 10/20/2015   Procedure: Coronary Stent Intervention;  Surgeon: Kathleene Hazel, MD;  Location: [facility] INVASIVE CV LAB;  Service: Cardiovascular;  Laterality: N/A;   ICD IMPLANT N/A 11/18/2017   Procedure: ICD IMPLANT;  Surgeon: Regan Lemming, MD;  Location: [facility] INVASIVE CV LAB;  Service: Cardiovascular;  Laterality: N/A;   INTRAVASCULAR PRESSURE WIRE/FFR STUDY N/A 03/03/2017   Procedure: INTRAVASCULAR PRESSURE WIRE/FFR STUDY;  Surgeon: Marykay Lex, MD;  Location: [facility] INVASIVE CV LAB;  Service: Cardiovascular;  Laterality: N/A;   LEFT HEART CATH AND CORONARY ANGIOGRAPHY N/A 03/03/2017   Procedure: LEFT HEART CATH AND CORONARY ANGIOGRAPHY;  Surgeon: Marykay Lex, MD;  Location: [facility] INVASIVE CV LAB;  Service: Cardiovascular;  Laterality: N/A;   MITRAL VALVE REPAIR Right 07/07/2017   Procedure: MINIMALLY INVASIVE MITRAL VALVE REPAIR (MVR) using Carpentier-McCarthy Adams Ring size 26;  Surgeon: Tressie Stalker  H, MD;  Location: [facility] OR;  Service: Open Heart Surgery;  Laterality: Right;   MULTIPLE EXTRACTIONS WITH ALVEOLOPLASTY N/A 05/13/2017   Procedure: Extraction of tooth #'s [phone] with alveoloplasty and gross debridement of remaining teeth.;  Surgeon: Charlynne Pander, DDS;  Location: [facility] OR;  Service: Oral Surgery;  Laterality: N/A;   none     OTHER SURGICAL HISTORY     NONE   PATENT FORAMEN OVALE(PFO) CLOSURE N/A 07/07/2017   Procedure: PATENT FORAMEN OVALE (PFO) CLOSURE;   Surgeon: Purcell Nails, MD;  Location: [facility] OR;  Service: Open Heart Surgery;  Laterality: N/A;   RIGHT HEART CATH N/A 09/22/2017   Procedure: RIGHT HEART CATH;  Surgeon: Laurey Morale, MD;  Location: [facility] INVASIVE CV LAB;  Service: Cardiovascular;  Laterality: N/A;   TEE WITHOUT CARDIOVERSION N/A 04/19/2017   Procedure: TRANSESOPHAGEAL ECHOCARDIOGRAM (TEE);  Surgeon: Laurey Morale, MD;  Location: [facility] ENDOSCOPY;  Service: Cardiovascular;  Laterality: N/A;   TEE WITHOUT CARDIOVERSION N/A 07/07/2017   Procedure: TRANSESOPHAGEAL ECHOCARDIOGRAM (TEE);  Surgeon: Purcell Nails, MD;  Location: [facility] OR;  Service: Open Heart Surgery;  Laterality: N/A;       Home Medications    Prior to Admission medications   Medication Sig Start Date End Date Taking? Authorizing Provider  albuterol (VENTOLIN HFA) 108 (90 Base) MCG/ACT inhaler Inhale 2 puffs into the lungs every 6 (six) hours as needed for wheezing or shortness of breath. 06/07/19  Yes Arvilla Market, MD  amoxicillin (AMOXIL) 500 MG capsule Take 1 capsule (500 mg total) by mouth 2 (two) times daily for 10 days. 06/17/21 06/27/21 Yes Danyel Tobey, Acie Fredrickson, FNP  apixaban (ELIQUIS) 5 MG TABS tablet Take 1 tablet (5 mg total) by mouth 2 (two) times daily. 04/17/21  Yes Laurey Morale, MD  atorvastatin (LIPITOR) 80 MG tablet Take 1 tablet (80 mg total) by mouth daily. 05/06/21  Yes Laurey Morale, MD  carvedilol (COREG) 12.5 MG tablet Take 1 tablet (12.5 mg total) by mouth 2 (two) times daily. 05/08/21  Yes Robbie Lis M, PA-C  cyclobenzaprine (FLEXERIL) 5 MG tablet Take 1 tablet (5 mg total) by mouth 3 (three) times daily as needed for muscle spasms. 08/23/20  Yes Laurey Morale, MD  empagliflozin (JARDIANCE) 10 MG TABS tablet Take 1 tablet (10 mg total) by mouth daily before breakfast. 03/26/21  Yes Robbie Lis M, PA-C  erythromycin ophthalmic ointment Place a 1/2 inch ribbon of ointment into the lower eyelid 4 times daily for 7 days.  06/17/21  Yes Katrianna Friesenhahn, Rolly Salter E, FNP  ezetimibe (ZETIA) 10 MG tablet Take 1 tablet (10 mg total) by mouth daily. 08/23/20 08/23/21 Yes Laurey Morale, MD  fenofibrate (TRICOR) 145 MG tablet Take 1 tablet (145 mg total) by mouth daily. 05/21/21  Yes Laurey Morale, MD  ferrous sulfate 325 (65 FE) MG tablet Take 325 mg by mouth daily.   Yes [provider]  folic acid (FOLVITE) 1 MG tablet Take 1 mg by mouth daily.   Yes [provider]  furosemide (LASIX) 40 MG tablet Take 1 tablet (40 mg total) by mouth 3 (three) times a week on Monday, Wednesday and Friday 05/09/21  Yes Simmons, Brittainy M, PA-C  potassium chloride SA (KLOR-CON M) 20 MEQ tablet Take 1 tablet (20 mEq total) by mouth 3 (three) times a week on Monday, Wednesday, and Friday. 05/21/21  Yes Simmons, Brittainy M, PA-C  sacubitril-valsartan (ENTRESTO) 97-103 MG Take 1 tablet by mouth 2 (  two) times daily. 03/26/21 03/26/22 Yes Simmons, Brittainy M, PA-C  spironolactone (ALDACTONE) 25 MG tablet Take 1 tablet (25 mg total) by mouth in the morning. 04/29/21  Yes Laurey Morale, MD  tiotropium (SPIRIVA HANDIHALER) 18 MCG inhalation capsule Place 1 capsule (18 mcg total) into inhaler and inhale daily. 08/23/19  Yes Laurey Morale, MD    Family History Family History  Problem Relation Age of Onset   Heart attack Sister 62       S/P CABG   Heart attack Father    Deep vein thrombosis Father     Social History Social History   Tobacco Use   Smoking status: Former    Packs/day: 0.50    Years: 17.00    Pack years: 8.50    Types: Cigarettes   Smokeless tobacco: Never   Tobacco comments:    quit since heart attack in 10/2015  Vaping Use   Vaping Use: Never used  Substance Use Topics   Alcohol use: Not Currently   Drug use: Not Currently    Types: Marijuana     Allergies   Shellfish allergy   Review of Systems Review of Systems Per HPI  Physical Exam Triage Vital Signs ED Triage Vitals  Enc Vitals Group     BP  06/17/21 1522 132/75     Pulse Rate 06/17/21 1522 85     Resp 06/17/21 1522 18     Temp 06/17/21 1522 98.8 F (37.1 C)     Temp Source 06/17/21 1522 Oral     SpO2 06/17/21 1522 93 %     Weight 06/17/21 1526 285 lb (129.3 kg)     Height 06/17/21 1526 5\' 11"  (1.803 m)     Head Circumference --      Peak Flow --      Pain Score 06/17/21 1526 7     Pain Loc --      Pain Edu? --      Excl. in GC? --    No data found.  Updated Vital Signs BP 132/75 (BP Location: Right Arm)   Pulse 85   Temp 98.8 F (37.1 C) (Oral)   Resp 18   Ht 5\' 11"  (1.803 m)   Wt 285 lb (129.3 kg)   SpO2 93%   BMI 39.75 kg/m   Visual Acuity Right Eye Distance:   Left Eye Distance:   Bilateral Distance:    Right Eye Near:   Left Eye Near:    Bilateral Near:     Physical Exam Constitutional:      General: He is not in acute distress.    Appearance: Normal appearance. He is not toxic-appearing or diaphoretic.  HENT:     Head: Normocephalic and atraumatic.     Right Ear: Tympanic membrane and ear canal normal.     Left Ear: Tympanic membrane and ear canal normal.     Nose: Congestion present.     Mouth/Throat:     Mouth: Mucous membranes are moist.     Pharynx: Posterior oropharyngeal erythema present.  Eyes:     General: Lids are normal. Lids are everted, no foreign bodies appreciated. Vision grossly intact. Gaze aligned appropriately.     Extraocular Movements: Extraocular movements intact.     Conjunctiva/sclera:     Right eye: Right conjunctiva is injected. No chemosis, exudate or hemorrhage.    Left eye: Left conjunctiva is injected. No chemosis, exudate or hemorrhage.    Pupils: Pupils are equal, round, and  reactive to light.  Cardiovascular:     Rate and Rhythm: Normal rate and regular rhythm.     Pulses: Normal pulses.     Heart sounds: Normal heart sounds.  Pulmonary:     Effort: Pulmonary effort is normal. No respiratory distress.     Breath sounds: Normal breath sounds. No stridor.  No wheezing, rhonchi or rales.  Abdominal:     General: Abdomen is flat. Bowel sounds are normal.     Palpations: Abdomen is soft.  Musculoskeletal:        General: Normal range of motion.     Cervical back: Normal range of motion.  Skin:    General: Skin is warm and dry.  Neurological:     General: No focal deficit present.     Mental Status: He is alert and oriented to person, place, and time. Mental status is at baseline.  Psychiatric:        Mood and Affect: Mood normal.        Behavior: Behavior normal.     UC Treatments / Results  Labs (all labs ordered are listed, but only abnormal results are displayed) Labs Reviewed  CULTURE, GROUP A STREP [facility])  POCT RAPID STREP A (OFFICE)    EKG   Radiology No results found.  Procedures Procedures (including critical care time)  Medications Ordered in UC Medications - No data to display  Initial Impression / Assessment and Plan / UC Course  I have reviewed the triage vital signs and the nursing notes.  Pertinent labs & imaging results that were available during my care of the patient were reviewed by me and considered in my medical decision making (see chart for details).     Rapid strep was negative but still suspicious of strep throat given appearance of posterior pharynx on exam and patient's close strep exposure.  Will opt to treat with amoxicillin antibiotic.  It also appears the patient has bilateral bacterial conjunctivitis so will treat with erythromycin.  Visual acuity appears normal.  Discussed possibility of viral upper respiratory infection being present as well.  Discussed supportive care and symptom management with patient.  No signs of peritonsillar abscess on exam.  Discussed return precautions.  Patient verbalized understanding and was agreeable with plan. Final Clinical Impressions(s) / UC Diagnoses   Final diagnoses:  Sore throat  Acute upper respiratory infection  Acute cough  Bacterial  conjunctivitis of both eyes  Strep throat exposure     Discharge Instructions      Your rapid strep was negative but I am still suspicious of strep throat so you are being treated with antibiotic.  You also have pinkeye which is being treated with an antibiotic ointment.  Please follow-up if symptoms persist or worsen.    ED Prescriptions     Medication Sig Dispense Auth. Provider   erythromycin ophthalmic ointment Place a 1/2 inch ribbon of ointment into the lower eyelid 4 times daily for 7 days. 3.5 g Ervin KnackMound, Garry Bochicchio E, [place]   amoxicillin (AMOXIL) 500 MG capsule Take 1 capsule (500 mg total) by mouth 2 (two) times daily for 10 days. 20 capsule Gustavus BryantMound, Ryelee Albee E, [place]      PDMP not reviewed this encounter.   Gustavus BryantMound, Ryonna Cimini E, [place]NP 06/17/21 1600

## 2021-06-17 NOTE — ED Triage Notes (Signed)
Patient c/o sore throat, cough and congestion for several days.

## 2021-06-17 NOTE — Discharge Instructions (Signed)
Your rapid strep was negative but I am still suspicious of strep throat so you are being treated with antibiotic.  You also have pinkeye which is being treated with an antibiotic ointment.  Please follow-up if symptoms persist or worsen.

## 2021-06-20 LAB — CULTURE, GROUP A STREP (THRC)

## 2021-06-25 ENCOUNTER — Other Ambulatory Visit (HOSPITAL_COMMUNITY): Payer: Self-pay

## 2021-07-04 ENCOUNTER — Telehealth: Payer: Self-pay | Admitting: *Deleted

## 2021-07-04 ENCOUNTER — Telehealth: Payer: Medicare Other | Admitting: Cardiology

## 2021-07-04 ENCOUNTER — Telehealth (INDEPENDENT_AMBULATORY_CARE_PROVIDER_SITE_OTHER): Payer: Medicare Other | Admitting: Cardiology

## 2021-07-04 VITALS — Ht 71.0 in | Wt 285.0 lb

## 2021-07-04 DIAGNOSIS — G4733 Obstructive sleep apnea (adult) (pediatric): Secondary | ICD-10-CM | POA: Diagnosis not present

## 2021-07-04 DIAGNOSIS — I255 Ischemic cardiomyopathy: Secondary | ICD-10-CM | POA: Diagnosis not present

## 2021-07-04 NOTE — Telephone Encounter (Signed)
Per Dr Mayford Knife,  order him a new headgear.

## 2021-07-04 NOTE — Patient Instructions (Signed)
Medication Instructions:  Your physician recommends that you continue on your current medications as directed. Please refer to the Current Medication list given to you today.  *If you need a refill on your cardiac medications before your next appointment, please call your pharmacy*   Lab Work: none If you have labs (blood work) drawn today and your tests are completely normal, you will receive your results only by: MyChart Message (if you have MyChart) OR A paper copy in the mail If you have any lab test that is abnormal or we need to change your treatment, we will call you to review the results.   Testing/Procedures: none   Follow-Up: At [facility], you and your health needs are our priority.  As part of our continuing mission to provide you with exceptional heart care, we have created designated Provider Care Teams.  These Care Teams include your primary Cardiologist (physician) and Advanced Practice Providers (APPs -  Physician Assistants and Nurse Practitioners) who all work together to provide you with the care you need, when you need it.  We recommend signing up for the patient portal called "MyChart".  Sign up information is provided on this After Visit Summary.  MyChart is used to connect with patients for Virtual Visits (Telemedicine).  Patients are able to view lab/test results, encounter notes, upcoming appointments, etc.  Non-urgent messages can be sent to your provider as well.   To learn more about what you can do with MyChart, go to [email or web address].    Your next appointment:   12 month(s)  The format for your next appointment:   Virtual Visit   Provider:   Dr Mayford Knife  If primary card or EP is not listed click here to update    :1}    Other Instructions    Important Information About Sugar

## 2021-07-04 NOTE — Telephone Encounter (Signed)
Order placed to [facility] via fax. Per Jasmine December the patient has an order on file they are waiting for the auth to come back.

## 2021-07-04 NOTE — Progress Notes (Signed)
Virtual Visit via Video Note   This visit type was conducted due to national recommendations for restrictions regarding the COVID-19 Pandemic (e.g. social distancing) in an effort to limit this patient's exposure and mitigate transmission in our community.  Due to his co-morbid illnesses, this patient is at least at moderate risk for complications without adequate follow up.  This format is felt to be most appropriate for this patient at this time.  All issues noted in this document were discussed and addressed.  A limited physical exam was performed with this format.  Please refer to the patient's chart for his consent to telehealth for [facility].       Date:  07/04/2021   ID:  Joshua May, DOB 1975-03-18, MRN [id number] The patient was identified using 2 identifiers.  Patient Location: Home Provider Location: Home Office   PCP:  Arvilla Market, MD   [facility] Providers Cardiologist:  Rollene Rotunda, MD Electrophysiologist:  Will [name], MD     Evaluation Performed:  Follow-Up Visit  Chief Complaint:  OSA  History of Present Illness:    Joshua May is a 46 y.o. male with with a history of CAD, chronic systolic CHF, hyperlipidemia, ischemic cardiomyopathy, pulmonary embolism and mitral valve repair.   He was referred for home sleep study by Dr. Marca Ancona for CHF.  This showed severe obstructive sleep apnea with an AHI of 68.6/h and moderate central sleep apnea with an pAHIc of 19.1/h with 4.8% of the time noted to be in Cheyne-Stokes respirations.  He had nocturnal hypoxemia and spent 215 minutes with O2 saturations less than 88%.  The patient underwent CPAP titration but due to ongoing respiratory events he was changed to BiPAP and eventually an auto BiPAP with IPAP max 20 cm H2O, EPAP min 5 cm H2O and pressure support 4 cm H2O was ordered.   He is doing well with his BiPAP device and thinks that he has gotten used to it.  He tolerates the mask  and feels the pressure is adequate.  He does need a new headgear as the strap has worn out. Since going on BiPAP he feels rested in the am and has no significant daytime sleepiness.  He denies any significant mouth or nasal dryness or nasal congestion.  He has been having a lot of mouth dryness but has not adjusted his device. He does not think that he snores.     The patient does not have symptoms concerning for COVID-19 infection (fever, chills, cough, or new shortness of breath).    Past Medical History:  Diagnosis Date   Asthma    CAD (coronary artery disease)    Chronic systolic CHF (congestive heart failure) (HCC)    HLD (hyperlipidemia)    Qualifier: Diagnosis of  By: Antoine Poche, MD, Gerrit Heck     Hypercholesteremia    Inferior myocardial infarction [facility]) 2007   PCI of RCA   Inferior myocardial infarction (HCC) 03/31/2008   PCI of RCA with bare metal stent   Ischemic cardiomyopathy    Migraines    and dizziness   Mitral regurgitation    Obstructive sleep apnea    Qualifier: Diagnosis of  By: Antoine Poche, MD, FACC, James     Posterolateral myocardial infarction (HCC) 10/20/2015   PCI of LCx using DES   Pulmonary embolism (HCC) 02/28/2017   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   Carpentier-McCarthy Adams ring annuloplasty,   size 26     Tobacco abuse  Past Surgical History:  Procedure Laterality Date   CARDIAC CATHETERIZATION N/A 10/20/2015   Procedure: Left Heart Cath and Coronary Angiography;  Surgeon: Kathleene Hazel, MD;  Location: [facility] INVASIVE CV LAB;  Service: Cardiovascular;  Laterality: N/A;   CARDIAC CATHETERIZATION N/A 10/20/2015   Procedure: Coronary Stent Intervention;  Surgeon: Kathleene Hazel, MD;  Location: [facility] INVASIVE CV LAB;  Service: Cardiovascular;  Laterality: N/A;   ICD IMPLANT N/A 11/18/2017   Procedure: ICD IMPLANT;  Surgeon: Regan Lemming, MD;  Location: [facility] INVASIVE CV LAB;  Service: Cardiovascular;  Laterality: N/A;    INTRAVASCULAR PRESSURE WIRE/FFR STUDY N/A 03/03/2017   Procedure: INTRAVASCULAR PRESSURE WIRE/FFR STUDY;  Surgeon: Marykay Lex, MD;  Location: [facility] INVASIVE CV LAB;  Service: Cardiovascular;  Laterality: N/A;   LEFT HEART CATH AND CORONARY ANGIOGRAPHY N/A 03/03/2017   Procedure: LEFT HEART CATH AND CORONARY ANGIOGRAPHY;  Surgeon: Marykay Lex, MD;  Location: [facility] INVASIVE CV LAB;  Service: Cardiovascular;  Laterality: N/A;   MITRAL VALVE REPAIR Right 07/07/2017   Procedure: MINIMALLY INVASIVE MITRAL VALVE REPAIR (MVR) using Carpentier-McCarthy Adams Ring size 26;  Surgeon: Purcell Nails, MD;  Location: [facility] OR;  Service: Open Heart Surgery;  Laterality: Right;   MULTIPLE EXTRACTIONS WITH ALVEOLOPLASTY N/A 05/13/2017   Procedure: Extraction of tooth #'s [phone] with alveoloplasty and gross debridement of remaining teeth.;  Surgeon: Charlynne Pander, DDS;  Location: [facility] OR;  Service: Oral Surgery;  Laterality: N/A;   none     OTHER SURGICAL HISTORY     NONE   PATENT FORAMEN OVALE(PFO) CLOSURE N/A 07/07/2017   Procedure: PATENT FORAMEN OVALE (PFO) CLOSURE;  Surgeon: Purcell Nails, MD;  Location: [facility] OR;  Service: Open Heart Surgery;  Laterality: N/A;   RIGHT HEART CATH N/A 09/22/2017   Procedure: RIGHT HEART CATH;  Surgeon: Laurey Morale, MD;  Location: [facility] INVASIVE CV LAB;  Service: Cardiovascular;  Laterality: N/A;   TEE WITHOUT CARDIOVERSION N/A 04/19/2017   Procedure: TRANSESOPHAGEAL ECHOCARDIOGRAM (TEE);  Surgeon: Laurey Morale, MD;  Location: [facility] ENDOSCOPY;  Service: Cardiovascular;  Laterality: N/A;   TEE WITHOUT CARDIOVERSION N/A 07/07/2017   Procedure: TRANSESOPHAGEAL ECHOCARDIOGRAM (TEE);  Surgeon: Purcell Nails, MD;  Location: [facility] OR;  Service: Open Heart Surgery;  Laterality: N/A;     No outpatient medications have been marked as taking for the 07/04/21 encounter (Appointment) with Quintella Reichert, MD.     Allergies:   Shellfish allergy   Social History   Tobacco Use    Smoking status: Former    Packs/day: 0.50    Years: 17.00    Total pack years: 8.50    Types: Cigarettes   Smokeless tobacco: Never   Tobacco comments:    quit since heart attack in 10/2015  Vaping Use   Vaping Use: Never used  Substance Use Topics   Alcohol use: Not Currently   Drug use: Not Currently    Types: Marijuana     Family Hx: The patient's family history includes Deep vein thrombosis in his father; Heart attack in his father; Heart attack (age of onset: 25) in his sister.  ROS:   Please see the history of present illness.     All other systems reviewed and are negative.   Prior CV studies:   The following studies were reviewed today:  Home sleep study, CPAP titration, PAP download  Labs/Other Tests and Data Reviewed:    EKG:  No ECG reviewed.  Recent Labs: 08/23/2020: B Natriuretic  Peptide 54.3 12/02/2020: ALT 51; BUN 14; Creatinine, Ser 1.03; Potassium 4.0; Sodium 139   Recent Lipid Panel Lab Results  Component Value Date/Time   CHOL 123 10/23/2020 02:10 PM   CHOL 136 04/24/2020 11:51 AM   TRIG 107 10/23/2020 02:10 PM   HDL 28 (L) 10/23/2020 02:10 PM   HDL 25 (L) 04/24/2020 11:51 AM   CHOLHDL 4.4 10/23/2020 02:10 PM   LDLCALC 74 10/23/2020 02:10 PM   LDLCALC 89 04/24/2020 11:51 AM   LDLDIRECT 149.8 11/15/2009 09:33 AM    Wt Readings from Last 3 Encounters:  06/17/21 285 lb (129.3 kg)  03/03/21 272 lb (123.4 kg)  12/02/20 279 lb 3.2 oz (126.6 kg)     Risk Assessment/Calculations:          Objective:    Vital Signs:  There were no vitals taken for this visit.   Well nourished, well developed male in no acute distress. Well appearing, alert and conversant, regular work of breathing,  good skin color  Eyes- anicteric mouth- oral mucosa is pink  neuro- grossly intact skin- no apparent rash or lesions or cyanosis  ASSESSMENT & PLAN:    OSA - The patient is tolerating PAP therapy well without any problems. The PAP download performed by his  DME was personally reviewed and interpreted by me today and showed an AHI of 8.2 /hr on auto BiPAP  with 83% compliance in using more than 4 hours nightly.  The patient has been using and benefiting from PAP use and will continue to benefit from therapy.  -I will order him a new headgear -I encouraged him to adjust the humidity on his device for mouth dryness -his AHI is elevated some but likely related to his mask leaking due to the strap is stretched out and the mask is falling off    COVID-19 Education: The signs and symptoms of COVID-19 were discussed with the patient and how to seek care for testing (follow up with PCP or arrange E-visit).  The importance of social distancing was discussed today.  Time:   Today, I have spent 15 minutes with the patient with telehealth technology discussing the above problems.     Medication Adjustments/Labs and Tests Ordered: Current medicines are reviewed at length with the patient today.  Concerns regarding medicines are outlined above.   Tests Ordered: No orders of the defined types were placed in this encounter.   Medication Changes: No orders of the defined types were placed in this encounter.   Follow Up:  Followup with me in 1 year  Signed, Armanda Magic, MD  07/04/2021 8:22 AM    [facility]

## 2021-07-21 ENCOUNTER — Encounter (HOSPITAL_COMMUNITY): Payer: Self-pay | Admitting: Cardiology

## 2021-07-21 ENCOUNTER — Ambulatory Visit (HOSPITAL_COMMUNITY)
Admission: RE | Admit: 2021-07-21 | Discharge: 2021-07-21 | Disposition: A | Discharge status: Home / Self Care | Payer: Medicare Other | Source: Ambulatory Visit | Attending: Cardiology | Admitting: Cardiology

## 2021-07-21 VITALS — BP 110/70 | HR 78 | Wt 282.8 lb

## 2021-07-21 DIAGNOSIS — E785 Hyperlipidemia, unspecified: Secondary | ICD-10-CM | POA: Insufficient documentation

## 2021-07-21 DIAGNOSIS — I255 Ischemic cardiomyopathy: Secondary | ICD-10-CM | POA: Diagnosis present

## 2021-07-21 DIAGNOSIS — I5022 Chronic systolic (congestive) heart failure: Secondary | ICD-10-CM | POA: Diagnosis present

## 2021-07-21 DIAGNOSIS — J449 Chronic obstructive pulmonary disease, unspecified: Secondary | ICD-10-CM | POA: Insufficient documentation

## 2021-07-21 DIAGNOSIS — Z7984 Long term (current) use of oral hypoglycemic drugs: Secondary | ICD-10-CM | POA: Insufficient documentation

## 2021-07-21 DIAGNOSIS — Z79899 Other long term (current) drug therapy: Secondary | ICD-10-CM | POA: Diagnosis not present

## 2021-07-21 DIAGNOSIS — I252 Old myocardial infarction: Secondary | ICD-10-CM | POA: Diagnosis not present

## 2021-07-21 DIAGNOSIS — I2510 Atherosclerotic heart disease of native coronary artery without angina pectoris: Secondary | ICD-10-CM | POA: Diagnosis not present

## 2021-07-21 DIAGNOSIS — I340 Nonrheumatic mitral (valve) insufficiency: Secondary | ICD-10-CM | POA: Diagnosis not present

## 2021-07-21 DIAGNOSIS — Z7901 Long term (current) use of anticoagulants: Secondary | ICD-10-CM | POA: Diagnosis not present

## 2021-07-21 LAB — BASIC METABOLIC PANEL
Anion gap: 7 (ref 5–15)
BUN: 16 mg/dL (ref 6–20)
CO2: 21 mmol/L — ABNORMAL LOW (ref 22–32)
Calcium: 9.1 mg/dL (ref 8.9–10.3)
Chloride: 111 mmol/L (ref 98–111)
Creatinine, Ser: 0.92 mg/dL (ref 0.61–1.24)
GFR, Estimated: >60 mL/min (ref >60)
Glucose, Bld: 109 mg/dL — ABNORMAL HIGH (ref 70–99)
Potassium: 4.2 mmol/L (ref 3.5–5.1)
Sodium: 139 mmol/L (ref 135–145)

## 2021-07-21 LAB — LIPID PANEL
Cholesterol: 99 mg/dL (ref 0–200)
HDL: 21 mg/dL — ABNORMAL LOW (ref >40)
LDL Cholesterol: 59 mg/dL (ref 0–99)
Total CHOL/HDL Ratio: 4.7 RATIO
Triglycerides: 97 mg/dL (ref <150)
VLDL: 19 mg/dL (ref 0–40)

## 2021-07-21 LAB — BRAIN NATRIURETIC PEPTIDE: B Natriuretic Peptide: 57.3 pg/mL (ref 0.0–100.0)

## 2021-07-21 NOTE — Patient Instructions (Signed)
EKG done today.  Labs done today. We will contact you only if your labs are abnormal.  No medication changes were made. Please continue all current medications as prescribed.  Your physician recommends that you schedule a follow-up appointment in: 4 months with an echo prior to your exam. Please contact our office in October to schedule a November appointment.   Your physician has requested that you have an echocardiogram. Echocardiography is a painless test that uses sound waves to create images of your heart. It provides your doctor with information about the size and shape of your heart and how well your heart's chambers and valves are working. This procedure takes approximately one hour. There are no restrictions for this procedure.  If you have any questions or concerns before your next appointment please send [place] a message through [place] or call our office at [phone].    TO LEAVE A MESSAGE FOR THE NURSE SELECT OPTION 2, PLEASE LEAVE A MESSAGE INCLUDING: YOUR NAME DATE OF BIRTH CALL BACK NUMBER REASON FOR CALL**this is important as we prioritize the call backs  YOU WILL RECEIVE A CALL BACK THE SAME DAY AS LONG AS YOU CALL BEFORE 4:00 PM   Do the following things EVERYDAY: Weigh yourself in the morning before breakfast. Write it down and keep it in a log. Take your medicines as prescribed Eat low salt foods--Limit salt (sodium) to 2000 mg per day.  Stay as active as you can everyday Limit all fluids for the day to less than 2 liters   At the [facility], you and your health needs are our priority. As part of our continuing mission to provide you with exceptional heart care, we have created designated Provider Care Teams. These Care Teams include your primary Cardiologist (physician) and Advanced Practice Providers (APPs- Physician Assistants and Nurse Practitioners) who all work together to provide you with the care you need, when you need it.   You may see  any of the following providers on your designated Care Team at your next follow up: Dr Arvilla Meres Dr Carron Curie, NP Robbie Lis, [place] Karle Plumber, PharmD   Please be sure to bring in all your medications bottles to every appointment.

## 2021-07-21 NOTE — Progress Notes (Signed)
PCP: None  HF Cardiology: Dr Shirlee Latch  CT Surgery: Dr Cornelius Moras  HPI: Joshua May is a 46 y.o. male AA male with a history of CAD dating back to 2007 when he had a PCI in [place]. In 2010 he presented with a STEMI and had an RCA DES placed. He followed up for a year but then didn't present again till Oct 2017 when he presented with a CFX infarct. Cath revealed 50% ISR of the RCA, total mCFX and 85% OM3. He had CFX PCI with DES and OM3 POBA. His EF then was 50-55%.  Admitted 02/27/17 with increased dyspnea and chest pain. CTA confirmed PE. ECHO completed and showed reduced EF 20-25%. Underwent LHC as noted below. [place] no evidence DVTs. CT surgery consulted for severe MR. Plan to optimize HF medications.Set up TEE as an outpatient. Discharge weight 222 pounds.   Admitted 06/2017 with scheduled MV repair. S/P Minimally invasive mitral valve repair on 6/19. ECHO 07/12/17 post surgery, no MR was noted but EF remained 25-30%. Tachycardic at the time of discharge. Discharge weight 257 pounds.   CPX was submaximal in 8/19, suggestive of marked deconditioning.  RHC in 9/19 showed relatively preserved cardiac index with only mildly elevated filling pressures.  Echo in 9/19 showed EF 30-35%. MDT ICD placed in 2019.   Echo in 8/21 showed EF 45%, RV normal, s/p MV repair with mean gradient 5 and MVA 1.78 cm^2 (mild mitral stenosis), trivial MR.   Echo in 11/22 showed EF 45-50%, s/p MV repair with no MR, moderate mitral stenosis with mean gradient 7 mmHg and MVA 1.12 by VTI, IVC normal, RV normal.   He returns today for followup of CHF and CAD.  He is now using Bipap, feels less sleepy during the day.  Weight is fairly stable, 3 lbs up.  No smoking or drinking.  Main complaint has been knee pain and low back pain.  He is short of breath walking longer distances, no orthopnea/PND.  No chest pain.   MDT device interrogation: Stable thoracic impedance.   ECG (personally reviewed): NSR, PVCs, poor RWP, nonspecific T wave  flattening  Labs (7/19): K 4.5, creatinine 0.95, LDL 51 Labs (8/19): K 3.8, creatinine 0.99 Labs (5/21): K 4.8, creatinine 1.08, LDL 34, TGs 381 Labs (10/21): LDL 78, HDL 74, TGs 178 Labs (12/21): K 4.2, creatinine 1.3 Labs (3/22): K 3.7, creatinine 1.12 Labs (4/22): LDL 89 Labs (10/22): AST 36, ALT 52, LDL 74, TGs 107 Labs (11/22): K 4, creatinine 1.03  PMH: 1. CAD: Initial PCI in 2007 in [place], inferior MI.   - Inferior STEMI 2010 with RCA PCI.  - CFx infarct in 2017 with LCx DES and OM3 POBA.  - LHC (2/19): Nonobstructive CAD.  2. 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  - TEE (4/19): LVEF 35-40%, Severe MR with PISA ERO 0.43 cm^2. Suspect infarct related MR with tethering of the posterior leaflet.  - Minimally invasive MV repair in 6/19.   3. Chronic systolic CHF: Ischemic cardiomyopathy. Medtronic ICD.  - Echo (2/19): EF 25-30%, diffuse HK with inferolateral AK, mildly decreased RV systolic function with D-shaped septum, moderate-severe infarct-related MR.  - Echo (6/19): EF 25-30%, apical and inferolateral hypokinesis, MV repair with mean gradient 9 mmHg with no MR, mildly decreased RV systolic function.  - CPX (8/19): VO2 13.2, VE/VCO2 slope 28, RER 0.87.  This study was suggestive of severe deconditioning.  - RHC (9/19): mean RA 9, PA 39/23 mean 24, mean PCWP 17, CI 2.2, PVR 1.35  WU - Echo (9/19): EF 30-35%, diffuse hypokinesis, normal RV, s/p MV repair with mean gradient 6 mmHg and PHT 104 msec => probably no significant stenosis.  - Echo (8/21): EF 45%, RV normal, s/p MV repair with mean gradient 5 and MVA 1.78 cm^2 (mild mitral stenosis), trivial MR.  - Echo (11/22): EF 45-50%, s/p MV repair with no MR, moderate mitral stenosis with mean gradient 7 mmHg and MVA 1.12 by VTI, IVC normal, RV normal.  4. Hyperlipidemia 5. PE: 2/19.  6. COPD: Moderate COPD on 6/19 PFTs.  7. OSA: Uses Bipap  Review of systems complete and found to be negative unless listed in HPI.    Social  History   Socioeconomic History   Marital status: Married    Spouse name: Not on file   Number of children: Not on file   Years of education: Not on file   Highest education level: Not on file  Occupational History   Not on file  Tobacco Use   Smoking status: Former    Packs/day: 0.50    Years: 17.00    Total pack years: 8.50    Types: Cigarettes   Smokeless tobacco: Never   Tobacco comments:    quit since heart attack in 10/2015  Vaping Use   Vaping Use: Never used  Substance and Sexual Activity   Alcohol use: Not Currently   Drug use: Not Currently    Types: Marijuana   Sexual activity: Not on file  Other Topics Concern   Not on file  Social History Narrative   The patient is married with two children.   Social Determinants of Health   Financial Resource Strain: Not on file  Food Insecurity: Not on file  Transportation Needs: Not on file  Physical Activity: Not on file  Stress: Not on file  Social Connections: Not on file  Intimate Partner Violence: Not on file   Family History  Problem Relation Age of Onset   Heart attack Sister 73       S/P CABG   Heart attack Father    Deep vein thrombosis Father     Current Outpatient Medications  Medication Sig Dispense Refill   albuterol (VENTOLIN HFA) 108 (90 Base) MCG/ACT inhaler Inhale 2 puffs into the lungs every 6 (six) hours as needed for wheezing or shortness of breath. 18 g 1   apixaban (ELIQUIS) 5 MG TABS tablet Take 1 tablet (5 mg total) by mouth 2 (two) times daily. 180 tablet 3   atorvastatin (LIPITOR) 80 MG tablet Take 1 tablet (80 mg total) by mouth daily. 90 tablet 3   carvedilol (COREG) 12.5 MG tablet Take 1 tablet (12.5 mg total) by mouth 2 (two) times daily. 180 tablet 3   cyclobenzaprine (FLEXERIL) 5 MG tablet Take 1 tablet (5 mg total) by mouth 3 (three) times daily as needed for muscle spasms. 14 tablet 0   empagliflozin (JARDIANCE) 10 MG TABS tablet Take 1 tablet (10 mg total) by mouth daily before  breakfast. 90 tablet 3   ezetimibe (ZETIA) 10 MG tablet Take 1 tablet (10 mg total) by mouth daily. 30 tablet 11   fenofibrate (TRICOR) 145 MG tablet Take 1 tablet (145 mg total) by mouth daily. 90 tablet 3   ferrous sulfate 325 (65 FE) MG tablet Take 325 mg by mouth daily.     folic acid (FOLVITE) 1 MG tablet Take 1 mg by mouth daily.     furosemide (LASIX) 40 MG tablet Take 1 tablet (  40 mg total) by mouth 3 (three) times a week on Monday, Wednesday and Friday 60 tablet 3   potassium chloride SA (KLOR-CON M) 20 MEQ tablet Take 1 tablet (20 mEq total) by mouth 3 (three) times a week on Monday, Wednesday, and Friday. 60 tablet 3   sacubitril-valsartan (ENTRESTO) 97-103 MG Take 1 tablet by mouth 2 (two) times daily. 180 tablet 3   spironolactone (ALDACTONE) 25 MG tablet Take 1 tablet (25 mg total) by mouth in the morning. 30 tablet 1   tiotropium (SPIRIVA HANDIHALER) 18 MCG inhalation capsule Place 1 capsule (18 mcg total) into inhaler and inhale daily. 30 capsule 12   No current facility-administered medications for this encounter.   Vitals:   07/21/21 1145  BP: 110/70  Pulse: 78  SpO2: 96%  Weight: 128.3 kg (282 lb 12.8 oz)    Wt Readings from Last 3 Encounters:  07/21/21 128.3 kg (282 lb 12.8 oz)  07/04/21 129.3 kg (285 lb)  06/17/21 129.3 kg (285 lb)    PHYSICAL EXAM: General: NAD Neck: No JVD, no thyromegaly or thyroid nodule.  Lungs: Clear to auscultation bilaterally with normal respiratory effort. CV: Nondisplaced PMI.  Heart regular S1/S2, no S3/S4, no murmur.  No peripheral edema.  No carotid bruit.  Normal pedal pulses.  Abdomen: Soft, nontender, no hepatosplenomegaly, no distention.  Skin: Intact without lesions or rashes.  Neurologic: Alert and oriented x 3.  Psych: Normal affect. Extremities: No clubbing or cyanosis.  HEENT: Normal.   ASSESSMENT & PLAN: 1. Chronic systolic CHF: Ischemic cardiomyopathy. Echo in 6/19 with EF 25-30%, echo 9/19 with EF 30-35%.  Medtronic  ICD.  Echo in 8/21 showed EF up to 45%.  Echo in 11/22 showed EF 45-50%.  He is not volume overloaded on exam or by Optivol. NYHA class II symptoms, suspect symptoms are more due to COPD than to CHF.   - Continue spironolactone 25 mg daily.   - Continue Coreg 12.5 mg bid.   - Continue Entresto 97/103 bid.   - Continue Lasix 40 mg tiw.  BMET today.  - Continue empagliflozin 10 mg daily.  - Repeat echo at followup in 4 months.  2. CAD: Stents in LCx and RCA. LHC 02/2017 showed nonobstructive disease. No chest pain.   - Continue atorvastatin 80 mg daily, check lipids.  - He is on apixaban so no ASA 81.  3. PE: In 2019, no definite trigger. Father with h/o VTE.  He should likely have long-term anticoagulation.   - Continue Eliquis.  4. Mitral regurgitation: Ischemic MR.  S/p minimally invasive MV repair in 6/19. Post-op echo 6/19 showed no MR but mean gradient 9 mmHg across MV. Echo (9/19) with mean MV gradient 6 mmHg but PHT short at 104 msec, probably no significant stenosis.  Echo in 8/21 showed mean gradient 5 mmHg across repaired MV with MVA 1.78 cm^2.  Echo in 11/22 showed mean gradient 7 mmHg across MV with MVA 1.12 cm^2, possible moderate mitral stenosis.  Only trivial MR.  - Reassess mitral valve by echo at followup in 4 months.  5. COPD: I think this is a major source for dyspnea.   6. Hyperlipidemia:  - Continue atorvastatin 80 mg daily.  - Continue Zetia 10 mg daily.   - Check lipids today.  7. OSA: Using Bipap.    Followup in 4 months with echo   Marca Ancona 07/21/2021

## 2021-07-24 ENCOUNTER — Other Ambulatory Visit (HOSPITAL_COMMUNITY): Payer: Self-pay

## 2021-07-25 ENCOUNTER — Other Ambulatory Visit (HOSPITAL_COMMUNITY): Payer: Self-pay

## 2021-08-04 ENCOUNTER — Other Ambulatory Visit: Payer: Self-pay | Admitting: Cardiology

## 2021-08-07 ENCOUNTER — Ambulatory Visit (INDEPENDENT_AMBULATORY_CARE_PROVIDER_SITE_OTHER): Payer: Medicare Other

## 2021-08-07 DIAGNOSIS — I255 Ischemic cardiomyopathy: Secondary | ICD-10-CM | POA: Diagnosis not present

## 2021-08-07 LAB — CUP PACEART REMOTE DEVICE CHECK
Battery Remaining Longevity: 105 mo
Battery Voltage: 3.01 V
Brady Statistic RV Percent Paced: 0.01 %
Date Time Interrogation Session: 20230720033526
HighPow Impedance: 75 Ohm
Implantable Lead Implant Date: 20191031
Implantable Lead Location: 753860
Implantable Lead Model: 6935
Implantable Pulse Generator Implant Date: 20191031
Lead Channel Impedance Value: 361 Ohm
Lead Channel Impedance Value: 418 Ohm
Lead Channel Pacing Threshold Amplitude: 0.5 V
Lead Channel Pacing Threshold Pulse Width: 0.4 ms
Lead Channel Sensing Intrinsic Amplitude: 10.75 mV
Lead Channel Sensing Intrinsic Amplitude: 10.75 mV
Lead Channel Setting Pacing Amplitude: 2 V
Lead Channel Setting Pacing Pulse Width: 0.4 ms
Lead Channel Setting Sensing Sensitivity: 0.45 mV

## 2021-08-08 ENCOUNTER — Other Ambulatory Visit (HOSPITAL_COMMUNITY): Payer: Self-pay

## 2021-08-21 ENCOUNTER — Other Ambulatory Visit (HOSPITAL_COMMUNITY): Payer: Self-pay | Admitting: Cardiology

## 2021-08-22 ENCOUNTER — Other Ambulatory Visit (HOSPITAL_COMMUNITY): Payer: Self-pay

## 2021-08-22 MED ORDER — EZETIMIBE 10 MG PO TABS
10.0000 mg | ORAL_TABLET | Freq: Every day | ORAL | 11 refills | Status: DC
  Filled 2021-08-22: qty 30, 30d supply, fill #0
  Filled 2021-09-19: qty 30, 30d supply, fill #1
  Filled 2021-10-29: qty 30, 30d supply, fill #2
  Filled 2021-11-26: qty 30, 30d supply, fill #3
  Filled 2022-01-05: qty 30, 30d supply, fill #4
  Filled 2022-02-03 – 2022-02-05 (×2): qty 30, 30d supply, fill #5
  Filled 2022-03-09: qty 30, 30d supply, fill #6
  Filled 2022-04-16: qty 30, 30d supply, fill #7
  Filled 2022-05-09: qty 30, 30d supply, fill #8
  Filled 2022-06-22: qty 30, 30d supply, fill #9
  Filled 2022-07-20: qty 30, 30d supply, fill #10

## 2021-08-22 MED ORDER — SPIRONOLACTONE 25 MG PO TABS
25.0000 mg | ORAL_TABLET | Freq: Every morning | ORAL | 1 refills | Status: DC
  Filled 2021-08-22: qty 30, 30d supply, fill #0
  Filled 2021-09-19: qty 30, 30d supply, fill #1

## 2021-08-25 NOTE — Progress Notes (Signed)
Remote ICD transmission.   

## 2021-09-03 ENCOUNTER — Other Ambulatory Visit (HOSPITAL_COMMUNITY): Payer: Self-pay

## 2021-09-05 ENCOUNTER — Other Ambulatory Visit: Payer: Self-pay | Admitting: Internal Medicine

## 2021-09-05 ENCOUNTER — Other Ambulatory Visit (HOSPITAL_COMMUNITY): Payer: Self-pay | Admitting: *Deleted

## 2021-09-05 ENCOUNTER — Other Ambulatory Visit (HOSPITAL_COMMUNITY): Payer: Self-pay

## 2021-09-05 DIAGNOSIS — J4520 Mild intermittent asthma, uncomplicated: Secondary | ICD-10-CM

## 2021-09-05 MED ORDER — ALBUTEROL SULFATE HFA 108 (90 BASE) MCG/ACT IN AERS
2.0000 | INHALATION_SPRAY | Freq: Four times a day (QID) | RESPIRATORY_TRACT | 1 refills | Status: DC | PRN
  Filled 2021-09-05: qty 18, 25d supply, fill #0

## 2021-09-19 ENCOUNTER — Other Ambulatory Visit (HOSPITAL_COMMUNITY): Payer: Self-pay

## 2021-10-13 ENCOUNTER — Other Ambulatory Visit (HOSPITAL_COMMUNITY): Payer: Self-pay

## 2021-10-29 ENCOUNTER — Other Ambulatory Visit (HOSPITAL_COMMUNITY): Payer: Self-pay | Admitting: Cardiology

## 2021-10-29 ENCOUNTER — Other Ambulatory Visit (HOSPITAL_COMMUNITY): Payer: Self-pay

## 2021-10-29 MED ORDER — SPIRONOLACTONE 25 MG PO TABS
25.0000 mg | ORAL_TABLET | Freq: Every morning | ORAL | 0 refills | Status: DC
  Filled 2021-10-29: qty 30, 30d supply, fill #0

## 2021-11-06 ENCOUNTER — Ambulatory Visit (INDEPENDENT_AMBULATORY_CARE_PROVIDER_SITE_OTHER): Payer: Medicare Other

## 2021-11-06 DIAGNOSIS — I255 Ischemic cardiomyopathy: Secondary | ICD-10-CM | POA: Diagnosis not present

## 2021-11-06 LAB — CUP PACEART REMOTE DEVICE CHECK
Battery Remaining Longevity: 102 mo
Battery Voltage: 3 V
Brady Statistic RV Percent Paced: 0.01 %
Date Time Interrogation Session: 20231019063326
HighPow Impedance: 69 Ohm
Implantable Lead Implant Date: 20191031
Implantable Lead Location: 753860
Implantable Lead Model: 6935
Implantable Pulse Generator Implant Date: 20191031
Lead Channel Impedance Value: 361 Ohm
Lead Channel Impedance Value: 418 Ohm
Lead Channel Pacing Threshold Amplitude: 0.625 V
Lead Channel Pacing Threshold Pulse Width: 0.4 ms
Lead Channel Sensing Intrinsic Amplitude: 11 mV
Lead Channel Sensing Intrinsic Amplitude: 11 mV
Lead Channel Setting Pacing Amplitude: 2 V
Lead Channel Setting Pacing Pulse Width: 0.4 ms
Lead Channel Setting Sensing Sensitivity: 0.45 mV

## 2021-11-14 NOTE — Progress Notes (Signed)
Remote ICD transmission.   

## 2021-11-17 ENCOUNTER — Other Ambulatory Visit (HOSPITAL_COMMUNITY): Payer: Self-pay

## 2021-11-26 ENCOUNTER — Other Ambulatory Visit (HOSPITAL_COMMUNITY): Payer: Self-pay

## 2021-11-26 ENCOUNTER — Other Ambulatory Visit (HOSPITAL_COMMUNITY): Payer: Self-pay | Admitting: Cardiology

## 2021-11-26 MED ORDER — SPIRONOLACTONE 25 MG PO TABS
25.0000 mg | ORAL_TABLET | Freq: Every morning | ORAL | 0 refills | Status: DC
  Filled 2021-11-26: qty 30, 30d supply, fill #0

## 2021-12-16 ENCOUNTER — Encounter (HOSPITAL_COMMUNITY): Payer: Self-pay | Admitting: Cardiology

## 2021-12-16 ENCOUNTER — Ambulatory Visit (HOSPITAL_BASED_OUTPATIENT_CLINIC_OR_DEPARTMENT_OTHER)
Admission: RE | Admit: 2021-12-16 | Discharge: 2021-12-16 | Disposition: A | Discharge status: Home / Self Care | Payer: Medicare Other | Source: Ambulatory Visit | Attending: Cardiology | Admitting: Cardiology

## 2021-12-16 ENCOUNTER — Ambulatory Visit (HOSPITAL_COMMUNITY)
Admission: RE | Admit: 2021-12-16 | Discharge: 2021-12-16 | Disposition: A | Discharge status: Home / Self Care | Payer: Medicare Other | Source: Ambulatory Visit | Attending: Internal Medicine | Admitting: Internal Medicine

## 2021-12-16 ENCOUNTER — Other Ambulatory Visit: Payer: Self-pay

## 2021-12-16 VITALS — BP 98/50 | HR 77 | Wt 289.0 lb

## 2021-12-16 DIAGNOSIS — E785 Hyperlipidemia, unspecified: Secondary | ICD-10-CM

## 2021-12-16 DIAGNOSIS — Z955 Presence of coronary angioplasty implant and graft: Secondary | ICD-10-CM | POA: Insufficient documentation

## 2021-12-16 DIAGNOSIS — R0789 Other chest pain: Secondary | ICD-10-CM | POA: Diagnosis not present

## 2021-12-16 DIAGNOSIS — Z5181 Encounter for therapeutic drug level monitoring: Secondary | ICD-10-CM | POA: Diagnosis not present

## 2021-12-16 DIAGNOSIS — I252 Old myocardial infarction: Secondary | ICD-10-CM | POA: Insufficient documentation

## 2021-12-16 DIAGNOSIS — J9801 Acute bronchospasm: Secondary | ICD-10-CM | POA: Insufficient documentation

## 2021-12-16 DIAGNOSIS — I2510 Atherosclerotic heart disease of native coronary artery without angina pectoris: Secondary | ICD-10-CM | POA: Diagnosis not present

## 2021-12-16 DIAGNOSIS — I5022 Chronic systolic (congestive) heart failure: Secondary | ICD-10-CM

## 2021-12-16 DIAGNOSIS — Z79899 Other long term (current) drug therapy: Secondary | ICD-10-CM | POA: Diagnosis not present

## 2021-12-16 DIAGNOSIS — J449 Chronic obstructive pulmonary disease, unspecified: Secondary | ICD-10-CM | POA: Diagnosis not present

## 2021-12-16 DIAGNOSIS — K0530 Chronic periodontitis, unspecified: Secondary | ICD-10-CM

## 2021-12-16 DIAGNOSIS — R7309 Other abnormal glucose: Secondary | ICD-10-CM

## 2021-12-16 DIAGNOSIS — Z7901 Long term (current) use of anticoagulants: Secondary | ICD-10-CM | POA: Insufficient documentation

## 2021-12-16 DIAGNOSIS — Z7984 Long term (current) use of oral hypoglycemic drugs: Secondary | ICD-10-CM | POA: Diagnosis not present

## 2021-12-16 DIAGNOSIS — I340 Nonrheumatic mitral (valve) insufficiency: Secondary | ICD-10-CM | POA: Diagnosis not present

## 2021-12-16 DIAGNOSIS — I255 Ischemic cardiomyopathy: Secondary | ICD-10-CM | POA: Diagnosis not present

## 2021-12-16 DIAGNOSIS — I5030 Unspecified diastolic (congestive) heart failure: Secondary | ICD-10-CM | POA: Diagnosis not present

## 2021-12-16 DIAGNOSIS — I342 Nonrheumatic mitral (valve) stenosis: Secondary | ICD-10-CM

## 2021-12-16 LAB — ECHOCARDIOGRAM COMPLETE
AR max vel: 2.28 cm2
AV Area VTI: 1.96 cm2
AV Area mean vel: 2.1 cm2
AV Mean grad: 5 mmHg
AV Peak grad: 9 mmHg
Ao pk vel: 1.5 m/s
Area-P 1/2: 2.46 cm2
Calc EF: 60.1 %
MV VTI: 1.13 cm2
S' Lateral: 4.4 cm
Single Plane A2C EF: 61.1 %
Single Plane A4C EF: 58.3 %

## 2021-12-16 LAB — BASIC METABOLIC PANEL
Anion gap: 8 (ref 5–15)
BUN: 15 mg/dL (ref 6–20)
CO2: 25 mmol/L (ref 22–32)
Calcium: 9.2 mg/dL (ref 8.9–10.3)
Chloride: 107 mmol/L (ref 98–111)
Creatinine, Ser: 1.19 mg/dL (ref 0.61–1.24)
GFR, Estimated: >60 mL/min (ref >60)
Glucose, Bld: 77 mg/dL (ref 70–99)
Potassium: 4.1 mmol/L (ref 3.5–5.1)
Sodium: 140 mmol/L (ref 135–145)

## 2021-12-16 LAB — BRAIN NATRIURETIC PEPTIDE: B Natriuretic Peptide: 40 pg/mL (ref 0.0–100.0)

## 2021-12-16 MED ORDER — PERFLUTREN LIPID MICROSPHERE
1.0000 mL | INTRAVENOUS | Status: DC | PRN
  Administered 2021-12-16: 3 mL via INTRAVENOUS

## 2021-12-16 NOTE — Patient Instructions (Addendum)
EKG done today.  Labs done today. We will contact you only if your labs are abnormal.  No medication changes were made. Please continue all current medications as prescribed.  Your physician recommends that you schedule a follow-up appointment in: 3 months  Non-Cardiac CT scanning, (CAT scanning), is a noninvasive, special x-ray that produces cross-sectional images of the body using x-rays and a computer. CT scans help physicians diagnose and treat medical conditions. For some CT exams, a contrast material is used to enhance visibility in the area of the body being studied. CT scans provide greater clarity and reveal more details than regular x-ray exams. This has to be approved through your insurance company prior to scheduling, once approved we will contact you to schedule an appointment.   You have been referred to Pulmonary rehab and to pulmonology. Both offices will contact you to schedule an appointment.    If you have any questions or concerns before your next appointment please send [place] a message through [place] or call our office at [phone].    TO LEAVE A MESSAGE FOR THE NURSE SELECT OPTION 2, PLEASE LEAVE A MESSAGE INCLUDING: YOUR NAME DATE OF BIRTH CALL BACK NUMBER REASON FOR CALL**this is important as we prioritize the call backs  YOU WILL RECEIVE A CALL BACK THE SAME DAY AS LONG AS YOU CALL BEFORE 4:00 PM   Do the following things EVERYDAY: Weigh yourself in the morning before breakfast. Write it down and keep it in a log. Take your medicines as prescribed Eat low salt foods--Limit salt (sodium) to 2000 mg per day.  Stay as active as you can everyday Limit all fluids for the day to less than 2 liters   At the [facility], you and your health needs are our priority. As part of our continuing mission to provide you with exceptional heart care, we have created designated Provider Care Teams. These Care Teams include your primary Cardiologist (physician)  and Advanced Practice Providers (APPs- Physician Assistants and Nurse Practitioners) who all work together to provide you with the care you need, when you need it.   You may see any of the following providers on your designated Care Team at your next follow up: Dr Arvilla Meres Dr Carron Curie, NP Robbie Lis, [place] Karle Plumber, PharmD   Please be sure to bring in all your medications bottles to every appointment.

## 2021-12-16 NOTE — Progress Notes (Signed)
PCP: None  HF Cardiology: Dr Shirlee Latch   HPI: Joshua May is a 46 y.o. male AA male with a history of CAD dating back to 2007 when he had a PCI in [place]. In 2010 he presented with a STEMI and had an RCA DES placed. He followed up for a year but then didn't present again till Oct 2017 when he presented with a CFX infarct. Cath revealed 50% ISR of the RCA, total mCFX and 85% OM3. He had CFX PCI with DES and OM3 POBA. His EF then was 50-55%.  Admitted 02/27/17 with increased dyspnea and chest pain. CTA confirmed PE. ECHO completed and showed reduced EF 20-25%. Underwent LHC as noted below. [place] no evidence DVTs. CT surgery consulted for severe MR. Plan to optimize HF medications.Set up TEE as an outpatient. Discharge weight 222 pounds.   Admitted 06/2017 with scheduled MV repair. S/P Minimally invasive mitral valve repair on 6/19. ECHO 07/12/17 post surgery, no MR was noted but EF remained 25-30%. Tachycardic at the time of discharge. Discharge weight 257 pounds.   CPX was submaximal in 8/19, suggestive of marked deconditioning.  RHC in 9/19 showed relatively preserved cardiac index with only mildly elevated filling pressures.  Echo in 9/19 showed EF 30-35%. MDT ICD placed in 2019.   Echo in 8/21 showed EF 45%, RV normal, s/p MV repair with mean gradient 5 and MVA 1.78 cm^2 (mild mitral stenosis), trivial MR.   Echo in 11/22 showed EF 45-50%, s/p MV repair with no MR, moderate mitral stenosis with mean gradient 7 mmHg and MVA 1.12 by VTI, IVC normal, RV normal.   Echo was done today and reviewed, EF 45% with global hypokinesis, mild RV dysfunction, s/p MV repair with no MR and mild-moderate MS mean gradient 5 mmHg, IVC normal.   He returns today for followup of CHF and CAD.  He uses Bipap every night. Weight up 7 lbs.  He has been using his inhalers more recently.  He gets chest tightness at times, not associated with exertion. The chest tightness is relieved by inhaler use.  He is trying to walk more but  gets short of breath if he walks fast.  Short of breath with stairs.  No orthopnea/PND.  No palpitations.  He takes Lasix about twice a week.   MDT device interrogation: Stable thoracic impedance, no AF/VT.   ECG (personally reviewed): NSR, LAFB, lateral/anterolateral TWIs  Labs (7/19): K 4.5, creatinine 0.95, LDL 51 Labs (8/19): K 3.8, creatinine 0.99 Labs (5/21): K 4.8, creatinine 1.08, LDL 34, TGs 381 Labs (10/21): LDL 78, HDL 74, TGs 178 Labs (12/21): K 4.2, creatinine 1.3 Labs (3/22): K 3.7, creatinine 1.12 Labs (4/22): LDL 89 Labs (10/22): AST 36, ALT 52, LDL 74, TGs 107 Labs (11/22): K 4, creatinine 1.03 Labs (7/23): LDL 59, BNP 57, K 4.2, creatinine 0.92  PMH: 1. CAD: Initial PCI in 2007 in [place], inferior MI.   - Inferior STEMI 2010 with RCA PCI.  - CFx infarct in 2017 with LCx DES and OM3 POBA.  - LHC (2/19): Nonobstructive CAD.  2. 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  - TEE (4/19): LVEF 35-40%, Severe MR with PISA ERO 0.43 cm^2. Suspect infarct related MR with tethering of the posterior leaflet.  - Minimally invasive MV repair in 6/19.   3. Chronic systolic CHF: Ischemic cardiomyopathy. Medtronic ICD.  - Echo (2/19): EF 25-30%, diffuse HK with inferolateral AK, mildly decreased RV systolic function with D-shaped septum, moderate-severe infarct-related MR.  - Echo (6/19):  EF 25-30%, apical and inferolateral hypokinesis, MV repair with mean gradient 9 mmHg with no MR, mildly decreased RV systolic function.  - CPX (8/19): VO2 13.2, VE/VCO2 slope 28, RER 0.87.  This study was suggestive of severe deconditioning.  - RHC (9/19): mean RA 9, PA 39/23 mean 24, mean PCWP 17, CI 2.2, PVR 1.35 WU - Echo (9/19): EF 30-35%, diffuse hypokinesis, normal RV, s/p MV repair with mean gradient 6 mmHg and PHT 104 msec => probably no significant stenosis.  - Echo (8/21): EF 45%, RV normal, s/p MV repair with mean gradient 5 and MVA 1.78 cm^2 (mild mitral stenosis), trivial MR.  - Echo  (11/22): EF 45-50%, s/p MV repair with no MR, moderate mitral stenosis with mean gradient 7 mmHg and MVA 1.12 by VTI, IVC normal, RV normal.  - Echo (11/23): EF 45% with global hypokinesis, mild RV dysfunction, s/p MV repair with no MR and mild-moderate MS mean gradient 5 mmHg, IVC normal.  4. Hyperlipidemia 5. PE: 2/19.  6. COPD: Moderate COPD on 6/19 PFTs.  7. OSA: Uses Bipap  Review of systems complete and found to be negative unless listed in HPI.    Social History   Socioeconomic History   Marital status: Married    Spouse name: Not on file   Number of children: Not on file   Years of education: Not on file   Highest education level: Not on file  Occupational History   Not on file  Tobacco Use   Smoking status: Former    Packs/day: 0.50    Years: 17.00    Total pack years: 8.50    Types: Cigarettes   Smokeless tobacco: Never   Tobacco comments:    quit since heart attack in 10/2015  Vaping Use   Vaping Use: Never used  Substance and Sexual Activity   Alcohol use: Not Currently   Drug use: Not Currently    Types: Marijuana   Sexual activity: Not on file  Other Topics Concern   Not on file  Social History Narrative   The patient is married with two children.   Social Determinants of Health   Financial Resource Strain: Not on file  Food Insecurity: Not on file  Transportation Needs: Not on file  Physical Activity: Not on file  Stress: Not on file  Social Connections: Not on file  Intimate Partner Violence: Not on file   Family History  Problem Relation Age of Onset   Heart attack Sister 43       S/P CABG   Heart attack Father    Deep vein thrombosis Father     Current Outpatient Medications  Medication Sig Dispense Refill   albuterol (VENTOLIN HFA) 108 (90 Base) MCG/ACT inhaler Inhale 2 puffs into the lungs every 6 (six) hours as needed for wheezing or shortness of breath. 18 g 1   apixaban (ELIQUIS) 5 MG TABS tablet Take 1 tablet (5 mg total) by mouth 2  (two) times daily. 180 tablet 3   atorvastatin (LIPITOR) 80 MG tablet Take 1 tablet (80 mg total) by mouth daily. 90 tablet 3   carvedilol (COREG) 12.5 MG tablet Take 1 tablet (12.5 mg total) by mouth 2 (two) times daily. 180 tablet 3   cyclobenzaprine (FLEXERIL) 5 MG tablet Take 1 tablet (5 mg total) by mouth 3 (three) times daily as needed for muscle spasms. 14 tablet 0   empagliflozin (JARDIANCE) 10 MG TABS tablet Take 1 tablet (10 mg total) by mouth daily before  breakfast. 90 tablet 3   ezetimibe (ZETIA) 10 MG tablet Take 1 tablet (10 mg total) by mouth daily. 30 tablet 11   fenofibrate (TRICOR) 145 MG tablet Take 1 tablet (145 mg total) by mouth daily. 90 tablet 3   ferrous sulfate 325 (65 FE) MG tablet Take 325 mg by mouth daily.     folic acid (FOLVITE) 1 MG tablet Take 1 mg by mouth daily.     furosemide (LASIX) 40 MG tablet Take 40 mg by mouth as needed.     potassium chloride SA (KLOR-CON M) 20 MEQ tablet Take 1 tablet (20 mEq total) by mouth 3 (three) times a week on Monday, Wednesday, and Friday. 60 tablet 3   sacubitril-valsartan (ENTRESTO) 97-103 MG Take 1 tablet by mouth 2 (two) times daily. 180 tablet 3   spironolactone (ALDACTONE) 25 MG tablet Take 1 tablet (25 mg total) by mouth in the morning. 30 tablet 0   tiotropium (SPIRIVA HANDIHALER) 18 MCG inhalation capsule Place 1 capsule (18 mcg total) into inhaler and inhale daily. 30 capsule 12   No current facility-administered medications for this encounter.   Vitals:   12/16/21 1409  BP: (!) 98/50  Pulse: 77  SpO2: 97%  Weight: 131.1 kg (289 lb)    Wt Readings from Last 3 Encounters:  12/16/21 131.1 kg (289 lb)  07/21/21 128.3 kg (282 lb 12.8 oz)  07/04/21 129.3 kg (285 lb)    PHYSICAL EXAM: General: NAD Neck: No JVD, no thyromegaly or thyroid nodule.  Lungs: Clear to auscultation bilaterally with normal respiratory effort. No wheezing CV: Nondisplaced PMI.  Heart regular S1/S2, no S3/S4, no murmur.  No peripheral  edema.  No carotid bruit.  Normal pedal pulses.  Abdomen: Soft, nontender, no hepatosplenomegaly, no distention.  Skin: Intact without lesions or rashes.  Neurologic: Alert and oriented x 3.  Psych: Normal affect. Extremities: No clubbing or cyanosis.  HEENT: Normal.   ASSESSMENT & PLAN: 1. Chronic systolic CHF: Ischemic cardiomyopathy. Echo in 6/19 with EF 25-30%, echo 9/19 with EF 30-35%.  Medtronic ICD.  Echo in 8/21 showed EF up to 45%.  Echo in 11/22 showed EF 45-50%. Echo today showed  EF 45% with global hypokinesis, mild RV dysfunction, s/p MV repair with no MR and mild-moderate MS mean gradient 5 mmHg, IVC normal.  He is not volume overloaded on exam or by Optivol. NYHA class III symptoms, COPD may be playing a larger role than CHF.  - Continue spironolactone 25 mg daily.   - Continue Coreg 12.5 mg bid.  No BP room to increase. - Continue Entresto 97/103 bid.   - Continue Lasix twice a week.  - Continue empagliflozin 10 mg daily.  2. CAD: Stents in LCx and RCA. LHC 02/2017 showed nonobstructive disease. He has atypical chest pain that is relieved with bronchodilators, ?bronchospasm.  - Continue atorvastatin 80 mg daily, good lipids in 7/23.   - He is on apixaban so no ASA 81.  - With atypical chest pain and h/o CAD, I will arrange for cardiac PET to assess for ischemia.  3. PE: In 2019, no definite trigger. Father with h/o VTE.  He should likely have long-term anticoagulation.   - Continue Eliquis.  4. Mitral regurgitation: Ischemic MR.  S/p minimally invasive MV repair in 6/19. Post-op echo 6/19 showed no MR but mean gradient 9 mmHg across MV. Echo (9/19) with mean MV gradient 6 mmHg but PHT short at 104 msec, probably no significant stenosis.  Echo in  8/21 showed mean gradient 5 mmHg across repaired MV with MVA 1.78 cm^2.  Echo in 11/22 showed mean gradient 7 mmHg across MV with MVA 1.12 cm^2, possible moderate mitral stenosis.  Only trivial MR.  Echo in 11/23 with no MR, mild-moderate  MS with mean gradient 5 mmHg.  5. COPD: I think this is a major source for dyspnea.  Having chest pain relieved by his inhaler, ?bronchospasm.  - I will refer to pulmonary.  - I will also refer for pulmonary rehab.  6. Hyperlipidemia: Lipids ok in 7/23.  - Continue atorvastatin 80 mg daily.  - Continue Zetia 10 mg daily.   7. OSA: Using Bipap.    Followup in 3 months with APP.   Loralie Champagne 12/16/2021

## 2021-12-16 NOTE — Progress Notes (Signed)
*  PRELIMINARY RESULTS* Echocardiogram 2D Echocardiogram has been performed.  Joshua May 12/16/2021, 2:06 PM

## 2021-12-17 LAB — HEMOGLOBIN A1C
Hgb A1c MFr Bld: 5.3 % (ref 4.8–5.6)
Mean Plasma Glucose: 105 mg/dL

## 2021-12-22 ENCOUNTER — Other Ambulatory Visit (HOSPITAL_COMMUNITY): Payer: Self-pay

## 2021-12-23 ENCOUNTER — Other Ambulatory Visit (HOSPITAL_COMMUNITY): Payer: Self-pay

## 2021-12-23 DIAGNOSIS — R079 Chest pain, unspecified: Secondary | ICD-10-CM

## 2021-12-23 DIAGNOSIS — I998 Other disorder of circulatory system: Secondary | ICD-10-CM

## 2021-12-23 NOTE — Progress Notes (Signed)
Ok will do.

## 2021-12-29 ENCOUNTER — Telehealth (HOSPITAL_COMMUNITY): Payer: Self-pay

## 2021-12-29 NOTE — Telephone Encounter (Signed)
Called pt to see if he would like to schedule for pulmonary rehab, pt stated that he would like to schedule but he will call back after speaking with his wife about his schedule. Placed pt ppw in the mailing folder to be closed after 12/26 if we havent heard from him.

## 2021-12-31 ENCOUNTER — Other Ambulatory Visit (HOSPITAL_COMMUNITY): Payer: Self-pay

## 2021-12-31 DIAGNOSIS — R079 Chest pain, unspecified: Secondary | ICD-10-CM

## 2022-01-05 ENCOUNTER — Other Ambulatory Visit (HOSPITAL_COMMUNITY): Payer: Self-pay | Admitting: Cardiology

## 2022-01-05 ENCOUNTER — Other Ambulatory Visit (HOSPITAL_COMMUNITY): Payer: Self-pay

## 2022-01-05 ENCOUNTER — Encounter: Payer: Self-pay | Admitting: Pulmonary Disease

## 2022-01-05 ENCOUNTER — Ambulatory Visit (INDEPENDENT_AMBULATORY_CARE_PROVIDER_SITE_OTHER): Payer: Medicare Other

## 2022-01-05 ENCOUNTER — Ambulatory Visit (INDEPENDENT_AMBULATORY_CARE_PROVIDER_SITE_OTHER): Payer: Medicare Other | Admitting: Pulmonary Disease

## 2022-01-05 VITALS — BP 128/72 | HR 70 | Ht 71.0 in | Wt 285.0 lb

## 2022-01-05 DIAGNOSIS — I5022 Chronic systolic (congestive) heart failure: Secondary | ICD-10-CM

## 2022-01-05 DIAGNOSIS — J4530 Mild persistent asthma, uncomplicated: Secondary | ICD-10-CM

## 2022-01-05 DIAGNOSIS — I255 Ischemic cardiomyopathy: Secondary | ICD-10-CM | POA: Diagnosis not present

## 2022-01-05 DIAGNOSIS — G4733 Obstructive sleep apnea (adult) (pediatric): Secondary | ICD-10-CM

## 2022-01-05 DIAGNOSIS — Z86711 Personal history of pulmonary embolism: Secondary | ICD-10-CM | POA: Diagnosis not present

## 2022-01-05 DIAGNOSIS — R5381 Other malaise: Secondary | ICD-10-CM

## 2022-01-05 DIAGNOSIS — R0609 Other forms of dyspnea: Secondary | ICD-10-CM | POA: Diagnosis not present

## 2022-01-05 LAB — NITRIC OXIDE: Nitric Oxide: 21

## 2022-01-05 MED ORDER — BREZTRI AEROSPHERE 160-9-4.8 MCG/ACT IN AERO
2.0000 | INHALATION_SPRAY | Freq: Two times a day (BID) | RESPIRATORY_TRACT | 4 refills | Status: AC
  Filled 2022-01-05 – 2022-01-14 (×3): qty 10.7, 30d supply, fill #0

## 2022-01-05 MED ORDER — SPIRONOLACTONE 25 MG PO TABS
25.0000 mg | ORAL_TABLET | Freq: Every morning | ORAL | 2 refills | Status: DC
  Filled 2022-01-05: qty 90, 90d supply, fill #0
  Filled 2022-04-07: qty 90, 90d supply, fill #1
  Filled 2022-07-06: qty 90, 90d supply, fill #2

## 2022-01-05 NOTE — Progress Notes (Signed)
Synopsis: Referred in December 2023 for dyspnea, possible COPD.  He has an extensive cardiac history and has been followed by Dr. Sherlie Ban in the advanced heart failure clinic here at [facility].  In 2007 he had an MI, required stenting of a coronary artery.  This was all in [place].  He moved here and was seen by the advanced heart failure clinic after a hospitalization in early 2019 in the context of a circumflex occlusion.  He was found during that time to have severe mitral regurgitation and an LVEF of 25 to 30%.  In June 2019 he underwent mitral valve repair and over the next several years had gradual recovery of his left ventricular function.  He has obstructive sleep apnea and uses BiPAP.  He had a PE in February 2019.   Subjective:   PATIENT ID: Joshua May GENDER: male DOB: [birth year], MRN: [id number]   HPI  Chief Complaint  Patient presents with   Consult    Referred by cardiologist for possible COPD. Also has a history of PE. Increased SOB with activity.     Joshua May is here to see me for dyspnea: > he notices it when he moves around or bends over > if he moves too fast  > he can walk perhaps just a few steps he will get short of breath > he is supposed to go to cardiac rehab > he'll get chest tightness when he was short of breath > he had childhood asthma and had been hospitalized  > he was intubated and put on a ventilator when he ate a crab when he was 46 years old, he had anaphylaxis because of that  He has albuterol that he needs to use every day.  He doesn't get bronchitis or pneumonia.  The cold weather makes his dyspnea worse.  He stays out of the heat as much as he can.    He takes advair.   He is currently not on any blood thinners.  No other allergies that he knows of.  No hay fever  He used to smoke cigarettes, quit 2016.  He used to smoke 1/2 pack a day, started age 46.  Smoked about 20 years.  His occupation was janitorial work, some Holiday representative,  Event organiser. Security.  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  Worked in street sweeping.  Never had to wear a respiratory.   Record review: Dr. Burnetta Sabin notes are summarized in the synopsis above.  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   PCI of LCx using DES   Pulmonary embolism (HCC) 02/28/2017   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   Carpentier-McCarthy Adams ring annuloplasty,   size 26     Tobacco abuse      Family History  Problem Relation Age of Onset   Heart attack Sister  30       S/P CABG   Heart attack Father    Deep vein thrombosis Father      Social History   Socioeconomic History   Marital status: Married    Spouse name: Not on file   Number of children: Not on file   Years of education: Not on file   Highest education level: Not on file  Occupational History   Not on file  Tobacco Use   Smoking status: Former    Packs/day: 0.50    Years: 17.00    Total pack years: 8.50    Types: Cigarettes   Smokeless tobacco: Never   Tobacco comments:    quit since heart attack in 10/2015  Vaping Use   Vaping Use: Never used  Substance and Sexual Activity   Alcohol use: Not Currently   Drug use: Not Currently    Types: Marijuana   Sexual activity: Not on file  Other Topics Concern   Not on file  Social History Narrative   The patient is married with two children.   Social Determinants of  Health   Financial Resource Strain: Not on file  Food Insecurity: Not on file  Transportation Needs: Not on file  Physical Activity: Not on file  Stress: Not on file  Social Connections: Not on file  Intimate Partner Violence: Not on file     Allergies  Allergen Reactions   Shellfish Allergy Anaphylaxis     Outpatient Medications Prior to Visit  Medication Sig Dispense Refill   albuterol (VENTOLIN HFA) 108 (90 Base) MCG/ACT inhaler Inhale 2 puffs into the lungs every 6 (six) hours as needed for wheezing or shortness of breath. 18 g 1   apixaban (ELIQUIS) 5 MG TABS tablet Take 1 tablet (5 mg total) by mouth 2 (two) times daily. 180 tablet 3   atorvastatin (LIPITOR) 80 MG tablet Take 1 tablet (80 mg total) by mouth daily. 90 tablet 3   carvedilol (COREG) 12.5 MG tablet Take 1 tablet (12.5 mg total) by mouth 2 (two) times daily. 180 tablet 3   cyclobenzaprine (FLEXERIL) 5 MG tablet Take 1 tablet (5 mg total) by mouth 3 (three) times daily as needed for muscle spasms. 14 tablet 0   empagliflozin (JARDIANCE) 10 MG TABS tablet Take 1 tablet (10 mg total) by mouth daily before breakfast. 90 tablet 3   ezetimibe (ZETIA) 10 MG tablet Take 1 tablet (10 mg total) by mouth daily. 30 tablet 11   fenofibrate (TRICOR) 145 MG tablet Take 1 tablet (145 mg total) by mouth daily. 90 tablet 3   ferrous sulfate 325 (65 FE) MG tablet Take 325 mg by mouth daily.     folic acid (FOLVITE) 1 MG tablet Take 1 mg by mouth daily.     furosemide (LASIX) 40 MG tablet Take 40 mg by mouth as needed.     potassium chloride SA (KLOR-CON M) 20 MEQ tablet Take 1 tablet (20 mEq total) by mouth 3 (three) times a week on Monday, Wednesday, and Friday. 60 tablet 3   sacubitril-valsartan (ENTRESTO) 97-103 MG Take 1 tablet by mouth 2 (two) times daily. 180 tablet 3   spironolactone (ALDACTONE) 25 MG tablet Take 1 tablet (25 mg total) by mouth in the morning. 90 tablet 2   tiotropium (SPIRIVA HANDIHALER) 18 MCG inhalation  capsule Place 1 capsule (18 mcg total) into inhaler and inhale daily. 30 capsule 12   No facility-administered medications prior to visit.    Review  of Systems  Constitutional:  Negative for chills, fever, malaise/fatigue and weight loss.  HENT:  Negative for congestion, nosebleeds, sinus pain and sore throat.   Eyes:  Negative for photophobia, pain and discharge.  Respiratory:  Positive for shortness of breath. Negative for cough, hemoptysis, sputum production and wheezing.   Cardiovascular:  Negative for chest pain, palpitations, orthopnea and leg swelling.  Gastrointestinal:  Negative for abdominal pain, constipation, diarrhea, nausea and vomiting.  Genitourinary:  Negative for dysuria, frequency, hematuria and urgency.  Musculoskeletal:  Positive for joint pain. Negative for back pain, myalgias and neck pain.  Skin:  Negative for itching and rash.  Neurological:  Negative for tingling, tremors, sensory change, speech change, focal weakness, seizures, weakness and headaches.  Psychiatric/Behavioral:  Negative for memory loss, substance abuse and suicidal ideas. The patient is not nervous/anxious.       Objective:  Physical Exam   Vitals:   01/05/22 1425  BP: 128/72  Pulse: 70  SpO2: 98%  Weight: 285 lb (129.3 kg)  Height: 5\' 11"  (1.803 m)   RA  Gen: well appearing, no acute distress HENT: NCAT, OP clear, neck supple without masses Eyes: PERRL, EOMi Lymph: no cervical lymphadenopathy PULM: CTA B CV: RRR, no mgr, no JVD GI: BS+, soft, nontender, no hsm Derm: no rash or skin breakdown MSK: normal bulk and tone Neuro: A&Ox4, CN II-XII intact, strength 5/5 in all 4 extremities Psyche: normal mood and affect   CBC    Component Value Date/Time   WBC 7.3 03/26/2020 1223   RBC 4.48 03/26/2020 1223   HGB 13.7 03/26/2020 1223   HGB 13.1 10/27/2017 1513   HCT 44.3 03/26/2020 1223   HCT 39.0 10/27/2017 1513   PLT 292 03/26/2020 1223   PLT 363 10/27/2017 1513   MCV  98.9 03/26/2020 1223   MCV 87 10/27/2017 1513   MCH 30.6 03/26/2020 1223   MCHC 30.9 03/26/2020 1223   RDW 12.5 03/26/2020 1223   RDW 14.3 10/27/2017 1513   LYMPHSABS 1.7 10/20/2015 1144   MONOABS 0.6 10/20/2015 1144   EOSABS 0.0 10/20/2015 1144   BASOSABS 0.0 10/20/2015 1144     Chest imaging: 2019 chest x-ray showed elevated right hemidiaphragm, normal cardiac silhouette, ICD in place, normal pulmonary parenchyma 2019 CT angiogram chest images independently reviewed showing no pulmonary embolism, very mild paraseptal emphysema in the upper lobes bilaterally.  PFT: 2019 full pulmonary function testing showed ratio of 100%, FEV1 2.68 L 72% predicted, 10% change with bronchodilator, total lung capacity 5.75 L 80% predicted, DLCO 21.51 63% predicted  Labs:  Path:  Echo: 2023 echocardiogram LVEF 45%, grade 2 diastolic dysfunction, RV size is normal and systolic function mildly reduced, status post mitral valve repair  Heart Catheterization:       Assessment & Plan:   Poorly controlled mild persistent asthma  History of pulmonary embolism  DOE (dyspnea on exertion)  Morbid obesity due to excess calories (HCC)  Physical deconditioning  OSA (obstructive sleep apnea)  Chronic systolic heart failure (HCC)  Discussion: Joshua May is here to see me for shortness of breath in the context of extensive cardiac history, obesity, heart failure, and mild emphysema seen on a CT scan of his chest from several years ago.  He also has a lifelong history of asthma and lung function testing several years ago showed positive bronchodilator response.  In this particular case well I think physical deconditioning and heart failure are the main reasons for his shortness of breath he likely  does have a component of asthma and centrilobular emphysema contributing to his dyspnea.  Plan: Asthma with significant dyspnea, not well-controlled: Stop Spiriva Start Breztri 2 puffs twice daily no  matter how you feel Continue albuterol as needed for chest tightness wheezing or shortness of breath Practice good hand hygiene Exhaled nitric oxide test Lung function testing  Lung emphysema seen on CT scan of the chest in 2019: Lung function testing Chest x-ray  Heart failure, physical deconditioning: Referral to cardiac rehab Continue medications as directed by the advanced heart failure clinic  We will see you back in 6 to 8 weeks after the lung function test or sooner if needed  Immunizations:  There is no immunization history on file for this patient.   Current Outpatient Medications:    albuterol (VENTOLIN HFA) 108 (90 Base) MCG/ACT inhaler, Inhale 2 puffs into the lungs every 6 (six) hours as needed for wheezing or shortness of breath., Disp: 18 g, Rfl: 1   apixaban (ELIQUIS) 5 MG TABS tablet, Take 1 tablet (5 mg total) by mouth 2 (two) times daily., Disp: 180 tablet, Rfl: 3   atorvastatin (LIPITOR) 80 MG tablet, Take 1 tablet (80 mg total) by mouth daily., Disp: 90 tablet, Rfl: 3   carvedilol (COREG) 12.5 MG tablet, Take 1 tablet (12.5 mg total) by mouth 2 (two) times daily., Disp: 180 tablet, Rfl: 3   cyclobenzaprine (FLEXERIL) 5 MG tablet, Take 1 tablet (5 mg total) by mouth 3 (three) times daily as needed for muscle spasms., Disp: 14 tablet, Rfl: 0   empagliflozin (JARDIANCE) 10 MG TABS tablet, Take 1 tablet (10 mg total) by mouth daily before breakfast., Disp: 90 tablet, Rfl: 3   ezetimibe (ZETIA) 10 MG tablet, Take 1 tablet (10 mg total) by mouth daily., Disp: 30 tablet, Rfl: 11   fenofibrate (TRICOR) 145 MG tablet, Take 1 tablet (145 mg total) by mouth daily., Disp: 90 tablet, Rfl: 3   ferrous sulfate 325 (65 FE) MG tablet, Take 325 mg by mouth daily., Disp: , Rfl:    folic acid (FOLVITE) 1 MG tablet, Take 1 mg by mouth daily., Disp: , Rfl:    furosemide (LASIX) 40 MG tablet, Take 40 mg by mouth as needed., Disp: , Rfl:    potassium chloride SA (KLOR-CON M) 20 MEQ  tablet, Take 1 tablet (20 mEq total) by mouth 3 (three) times a week on Monday, Wednesday, and Friday., Disp: 60 tablet, Rfl: 3   sacubitril-valsartan (ENTRESTO) 97-103 MG, Take 1 tablet by mouth 2 (two) times daily., Disp: 180 tablet, Rfl: 3   spironolactone (ALDACTONE) 25 MG tablet, Take 1 tablet (25 mg total) by mouth in the morning., Disp: 90 tablet, Rfl: 2   tiotropium (SPIRIVA HANDIHALER) 18 MCG inhalation capsule, Place 1 capsule (18 mcg total) into inhaler and inhale daily., Disp: 30 capsule, Rfl: 12

## 2022-01-05 NOTE — Patient Instructions (Signed)
Asthma with significant dyspnea, not well-controlled: Stop Spiriva Start Breztri 2 puffs twice daily no matter how you feel Continue albuterol as needed for chest tightness wheezing or shortness of breath Practice good hand hygiene Exhaled nitric oxide test Lung function testing  Lung emphysema seen on CT scan of the chest in 2019: Lung function testing Chest x-ray  Heart failure, physical deconditioning: Referral to cardiac rehab Continue medications as directed by the advanced heart failure clinic  We will see you back in 6 to 8 weeks after the lung function test or sooner if needed

## 2022-01-05 NOTE — Addendum Note (Signed)
Addended by: Maurene Capes on: 01/05/2022 03:04 PM   Modules accepted: Orders

## 2022-01-05 NOTE — Addendum Note (Signed)
Addended by: Maurene Capes on: 01/05/2022 03:12 PM   Modules accepted: Orders

## 2022-01-06 ENCOUNTER — Other Ambulatory Visit (HOSPITAL_COMMUNITY): Payer: Self-pay

## 2022-01-06 ENCOUNTER — Other Ambulatory Visit: Payer: Self-pay

## 2022-01-07 ENCOUNTER — Other Ambulatory Visit: Payer: Self-pay

## 2022-01-07 DIAGNOSIS — J986 Disorders of diaphragm: Secondary | ICD-10-CM

## 2022-01-13 ENCOUNTER — Other Ambulatory Visit (HOSPITAL_COMMUNITY): Payer: Self-pay

## 2022-01-14 ENCOUNTER — Other Ambulatory Visit: Payer: Self-pay

## 2022-01-14 ENCOUNTER — Other Ambulatory Visit (HOSPITAL_COMMUNITY): Payer: Self-pay

## 2022-01-14 DIAGNOSIS — I5042 Chronic combined systolic (congestive) and diastolic (congestive) heart failure: Secondary | ICD-10-CM

## 2022-01-15 ENCOUNTER — Ambulatory Visit (HOSPITAL_COMMUNITY)
Admission: RE | Admit: 2022-01-15 | Discharge: 2022-01-15 | Disposition: A | Discharge status: Home / Self Care | Payer: Medicare Other | Source: Ambulatory Visit | Attending: Pulmonary Disease | Admitting: Pulmonary Disease

## 2022-01-15 DIAGNOSIS — J986 Disorders of diaphragm: Secondary | ICD-10-CM | POA: Insufficient documentation

## 2022-01-21 ENCOUNTER — Telehealth (HOSPITAL_COMMUNITY): Payer: Self-pay

## 2022-01-21 NOTE — Telephone Encounter (Signed)
Pt insurance is active and benefits verified through Medicare A/B. Co-pay $0.00, DED $240.00/$0.00 met, out of pocket $0.00/$0.00 met, co-insurance 20%. No pre-authorization required. 01/21/22 @ 12:07PM

## 2022-01-21 NOTE — Telephone Encounter (Signed)
Called patient to see if he was interested in participating in the Pulmonary Rehab Program. Patient stated yes. Patient will come in for orientation on 01/23/22 @ 1PM and will attend the 1:15PM exercise class.

## 2022-01-23 ENCOUNTER — Encounter (HOSPITAL_COMMUNITY)
Admission: RE | Admit: 2022-01-23 | Discharge: 2022-01-23 | Disposition: A | Discharge status: Home / Self Care | Payer: Medicare Other | Source: Ambulatory Visit | Attending: Pulmonary Disease | Admitting: Pulmonary Disease

## 2022-01-23 NOTE — Progress Notes (Signed)
Pt and spouse came in for Pulmonary Rehab orientation today. RN informed patient about the program and presented patient and spouse with a courtesy quote for rehab. Pt has a deductible which has not been yet this year yet. His wife stated that they never pay anything for any doctor's appointments, only pay co-pays for medications. RN informed patient and spouse to call insurance and check if rehab would be covered. Pt and spouse will reschedule orientation after checking with their insurance.

## 2022-01-27 ENCOUNTER — Ambulatory Visit (HOSPITAL_COMMUNITY): Payer: Medicare Other

## 2022-01-29 ENCOUNTER — Ambulatory Visit (HOSPITAL_COMMUNITY): Payer: Medicare Other

## 2022-02-03 ENCOUNTER — Ambulatory Visit (HOSPITAL_COMMUNITY): Payer: Medicare Other

## 2022-02-05 ENCOUNTER — Other Ambulatory Visit (HOSPITAL_COMMUNITY): Payer: Self-pay

## 2022-02-05 ENCOUNTER — Ambulatory Visit (HOSPITAL_COMMUNITY): Payer: Medicare Other

## 2022-02-05 ENCOUNTER — Ambulatory Visit: Payer: Medicare Other | Attending: Cardiology

## 2022-02-05 ENCOUNTER — Other Ambulatory Visit: Payer: Self-pay

## 2022-02-05 DIAGNOSIS — I255 Ischemic cardiomyopathy: Secondary | ICD-10-CM

## 2022-02-05 LAB — CUP PACEART REMOTE DEVICE CHECK
Battery Remaining Longevity: 98 mo
Battery Voltage: 3.01 V
Brady Statistic RV Percent Paced: 0.01 %
Date Time Interrogation Session: 20240118022602
HighPow Impedance: 83 Ohm
Implantable Lead Connection Status: 753985
Implantable Lead Implant Date: 20191031
Implantable Lead Location: 753860
Implantable Lead Model: 6935
Implantable Pulse Generator Implant Date: 20191031
Lead Channel Impedance Value: 342 Ohm
Lead Channel Impedance Value: 456 Ohm
Lead Channel Pacing Threshold Amplitude: 0.5 V
Lead Channel Pacing Threshold Pulse Width: 0.4 ms
Lead Channel Sensing Intrinsic Amplitude: 9.875 mV
Lead Channel Sensing Intrinsic Amplitude: 9.875 mV
Lead Channel Setting Pacing Amplitude: 2 V
Lead Channel Setting Pacing Pulse Width: 0.4 ms
Lead Channel Setting Sensing Sensitivity: 0.45 mV
Zone Setting Status: 755011
Zone Setting Status: 755011

## 2022-02-09 ENCOUNTER — Telehealth (HOSPITAL_COMMUNITY): Payer: Self-pay | Admitting: Emergency Medicine

## 2022-02-09 NOTE — Telephone Encounter (Signed)
Reaching out to patient to offer assistance regarding upcoming cardiac imaging study; pt verbalizes understanding of appt date/time, parking situation and where to check in, pre-test NPO status and medications ordered, and verified current allergies; name and call back number provided for further questions should they arise Joshua Bond RN Navigator Cardiac Imaging [facility] [phone] office [phone] cell  Arrival 1220 WL entranace No food No caffeine  Holding carvedilol

## 2022-02-10 ENCOUNTER — Ambulatory Visit (HOSPITAL_COMMUNITY): Payer: Medicare Other

## 2022-02-10 ENCOUNTER — Encounter (HOSPITAL_COMMUNITY)
Admission: RE | Admit: 2022-02-10 | Discharge: 2022-02-10 | Disposition: A | Discharge status: Home / Self Care | Payer: Medicare Other | Source: Ambulatory Visit | Attending: Cardiology | Admitting: Cardiology

## 2022-02-10 ENCOUNTER — Encounter (HOSPITAL_COMMUNITY): Payer: Self-pay

## 2022-02-10 ENCOUNTER — Telehealth (HOSPITAL_COMMUNITY): Payer: Self-pay

## 2022-02-10 DIAGNOSIS — R079 Chest pain, unspecified: Secondary | ICD-10-CM | POA: Diagnosis present

## 2022-02-10 DIAGNOSIS — I998 Other disorder of circulatory system: Secondary | ICD-10-CM

## 2022-02-10 LAB — NM PET CT CARDIAC PERFUSION MULTI W/ABSOLUTE BLOODFLOW
LV dias vol: 135 mL (ref 62–150)
LV sys vol: 72 mL
MBFR: 2.25
Nuc Rest EF: 47 %
Nuc Stress EF: 47 %
Rest MBF: 0.81 ml/g/min
Rest Nuclear Isotope Dose: 30.1 mCi
SDS: 0
SRS: 8
SSS: 8
ST Depression (mm): 0 mm
Stress MBF: 1.82 ml/g/min
Stress Nuclear Isotope Dose: 30.1 mCi

## 2022-02-10 MED ORDER — RUBIDIUM RB82 GENERATOR (RUBYFILL)
30.1000 | PACK | Freq: Once | INTRAVENOUS | Status: AC
  Administered 2022-02-10: 30.1 via INTRAVENOUS

## 2022-02-10 MED ORDER — REGADENOSON 0.4 MG/5ML IV SOLN
INTRAVENOUS | Status: AC
  Administered 2022-02-10: 0.4 mg
  Filled 2022-02-10: qty 5

## 2022-02-10 NOTE — Progress Notes (Signed)
Pt denies symptoms post scan. Caffeine provided. Patient ambulatory to waiting room with designated driver Marchia Bond RN Navigator Cardiac Imaging [facility] [phone] Office  [phone] Cell

## 2022-02-10 NOTE — Telephone Encounter (Signed)
Patient aware of results.

## 2022-02-12 ENCOUNTER — Ambulatory Visit (HOSPITAL_COMMUNITY): Payer: Medicare Other

## 2022-02-17 ENCOUNTER — Ambulatory Visit (HOSPITAL_COMMUNITY): Payer: Medicare Other

## 2022-02-19 ENCOUNTER — Ambulatory Visit (HOSPITAL_COMMUNITY): Payer: Medicare Other

## 2022-02-23 NOTE — Progress Notes (Signed)
Remote ICD transmission.   

## 2022-02-24 ENCOUNTER — Ambulatory Visit (INDEPENDENT_AMBULATORY_CARE_PROVIDER_SITE_OTHER): Payer: Medicare Other | Admitting: Pulmonary Disease

## 2022-02-24 ENCOUNTER — Other Ambulatory Visit (HOSPITAL_COMMUNITY): Payer: Self-pay

## 2022-02-24 ENCOUNTER — Encounter: Payer: Self-pay | Admitting: Pulmonary Disease

## 2022-02-24 VITALS — BP 128/64 | HR 66 | Temp 98.0°F | Ht 71.0 in | Wt 286.8 lb

## 2022-02-24 DIAGNOSIS — I255 Ischemic cardiomyopathy: Secondary | ICD-10-CM | POA: Diagnosis not present

## 2022-02-24 DIAGNOSIS — J432 Centrilobular emphysema: Secondary | ICD-10-CM

## 2022-02-24 DIAGNOSIS — J986 Disorders of diaphragm: Secondary | ICD-10-CM

## 2022-02-24 DIAGNOSIS — J4530 Mild persistent asthma, uncomplicated: Secondary | ICD-10-CM

## 2022-02-24 DIAGNOSIS — G4733 Obstructive sleep apnea (adult) (pediatric): Secondary | ICD-10-CM | POA: Diagnosis not present

## 2022-02-24 DIAGNOSIS — I5042 Chronic combined systolic (congestive) and diastolic (congestive) heart failure: Secondary | ICD-10-CM | POA: Diagnosis not present

## 2022-02-24 LAB — PULMONARY FUNCTION TEST
DL/VA % pred: 110 %
DL/VA: 4.98 ml/min/mmHg/L
DLCO cor % pred: 76 %
DLCO cor: 23.82 ml/min/mmHg
DLCO unc % pred: 76 %
DLCO unc: 23.82 ml/min/mmHg
FEF 25-75 Post: 2.41 L/sec
FEF 25-75 Pre: 1.95 L/sec
FEF2575-%Change-Post: 23 %
FEF2575-%Pred-Post: 63 %
FEF2575-%Pred-Pre: 51 %
FEV1-%Change-Post: 7 %
FEV1-%Pred-Post: 54 %
FEV1-%Pred-Pre: 51 %
FEV1-Post: 2.31 L
FEV1-Pre: 2.15 L
FEV1FVC-%Change-Post: 7 %
FEV1FVC-%Pred-Pre: 96 %
FEV6-%Change-Post: 0 %
FEV6-%Pred-Post: 54 %
FEV6-%Pred-Pre: 54 %
FEV6-Post: 2.82 L
FEV6-Pre: 2.83 L
FEV6FVC-%Pred-Post: 103 %
FEV6FVC-%Pred-Pre: 103 %
FVC-%Change-Post: 0 %
FVC-%Pred-Post: 52 %
FVC-%Pred-Pre: 52 %
FVC-Post: 2.83 L
FVC-Pre: 2.83 L
Post FEV1/FVC ratio: 82 %
Post FEV6/FVC ratio: 100 %
Pre FEV1/FVC ratio: 76 %
Pre FEV6/FVC Ratio: 100 %
RV % pred: 125 %
RV: 2.48 L
TLC % pred: 76 %
TLC: 5.45 L

## 2022-02-24 MED ORDER — AEROCHAMBER PLUS FLO-VU MISC
0 refills | Status: AC
  Filled 2022-02-24: qty 1, 1d supply, fill #0

## 2022-02-24 MED ORDER — ALBUTEROL SULFATE HFA 108 (90 BASE) MCG/ACT IN AERS
2.0000 | INHALATION_SPRAY | Freq: Four times a day (QID) | RESPIRATORY_TRACT | 3 refills | Status: AC | PRN
  Filled 2022-02-24: qty 18, 25d supply, fill #0

## 2022-02-24 MED ORDER — ALBUTEROL SULFATE HFA 108 (90 BASE) MCG/ACT IN AERS
2.0000 | INHALATION_SPRAY | Freq: Four times a day (QID) | RESPIRATORY_TRACT | 11 refills | Status: DC | PRN
  Filled 2022-02-24: qty 8, 12d supply, fill #0

## 2022-02-24 NOTE — Progress Notes (Signed)
Synopsis: Referred in December 2023 for dyspnea, possible COPD.  He has an extensive cardiac history and has been followed by Dr. Algernon Huxley in the advanced heart failure clinic here at [facility].  In 2007 he had an MI, required stenting of a coronary artery.  This was all in [place].  He moved here and was seen by the advanced heart failure clinic after a hospitalization in early 2019 in the context of a circumflex occlusion.  He was found during that time to have severe mitral regurgitation and an LVEF of 25 to 30%.  In June 2019 he underwent mitral valve repair and over the next several years had gradual recovery of his left ventricular function.  He has obstructive sleep apnea and uses BiPAP.  He had a PE in February 2019.  Has paralyzed right hemidiaphragm  Subjective:   PATIENT ID: Joshua May GENDER: male DOB: [birth year], MRN: [id number]   HPI  Chief Complaint  Patient presents with   Follow-up    PFT performed today.  Pt states he has been doing okay since last visit and denies any complaints.    Judithann Sauger has helped some with his breathing He still has some daytime somnolence, his girlfriend says he will have apnea No bronchitis or pneumonia since the last visit He says Judithann Sauger has been helpful. He said that the inhaler that he used today was better than what he has been taking at home.  Past Medical History:  Diagnosis Date   Asthma    CAD (coronary artery disease)    Chronic systolic CHF (congestive heart failure) (HCC)    HLD (hyperlipidemia)    Qualifier: Diagnosis of  By: Percival Spanish, MD, Farrel Gordon     Hypercholesteremia    Inferior myocardial infarction [facility]) 2007   PCI of RCA   Inferior myocardial infarction ([place]) 03/31/2008   PCI of RCA with bare metal stent   Ischemic cardiomyopathy    Migraines    and dizziness   Mitral regurgitation    Obstructive sleep apnea    Qualifier: Diagnosis of  By: Percival Spanish, MD, FACC, James     Posterolateral myocardial infarction  ([place]) 10/20/2015   PCI of LCx using DES   Pulmonary embolism ([place]) 02/28/2017   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  HENT:  Negative for congestion, sinus pain and sore throat.   Respiratory:  Negative for cough, sputum production and shortness of breath.   Cardiovascular:  Negative for chest pain and leg swelling.      Objective:  Physical Exam   Vitals:   02/24/22 1355  BP: 128/64  Pulse: 66  Temp: 98 F (36.7 C)  TempSrc: Oral  SpO2: 97%  Weight: 286 lb 12.8 oz (130.1 kg)  Height: 5\' 11"  (1.803 m)   RA  Gen: well appearing HENT: OP clear, neck supple PULM: CTA B, normal effort  CV: RRR, no mgr GI: BS+, soft, nontender Derm: no cyanosis or rash Psyche: normal mood and affect    CBC    Component Value Date/Time   WBC 7.3 03/26/2020 1223   RBC 4.48 03/26/2020 1223   HGB 13.7 03/26/2020 1223   HGB 13.1 10/27/2017 1513   HCT 44.3 03/26/2020 1223   HCT 39.0 10/27/2017 1513   PLT 292 03/26/2020 1223  PLT 363 10/27/2017 1513   MCV 98.9 03/26/2020 1223   MCV 87 10/27/2017 1513   MCH 30.6 03/26/2020 1223   MCHC 30.9 03/26/2020 1223   RDW 12.5 03/26/2020 1223   RDW 14.3 10/27/2017 1513   LYMPHSABS 1.7 10/20/2015 1144   MONOABS 0.6 10/20/2015 1144   EOSABS 0.0 10/20/2015 1144   BASOSABS 0.0 10/20/2015 1144     Chest imaging: 2019 chest x-ray showed elevated right hemidiaphragm, normal cardiac silhouette, ICD in place, normal pulmonary parenchyma 2019 CT angiogram chest images independently reviewed showing no pulmonary embolism, very mild paraseptal emphysema in the upper lobes bilaterally. December 2023 sniff test fluoroscopy showed paralyzed right hemidiaphragm. 2024 cardiac PET consistent with prior ischemic injury, lungs with some subsegmental  atelectasis   PFT: 2019 full pulmonary function testing showed ratio of 100%, FEV1 2.68 L 72% predicted, 10% change with bronchodilator, total lung capacity 5.75 L 80% predicted, DLCO 21.51 63% predicted 2023 full PFT: Ratio 82%, FVC 2.83 L 52% predicted, total lung capacity 5.45 L 76% predicted, DLCO 23.8 76% predicted  Labs:  Path:  Echo: 2023 echocardiogram LVEF 40%, grade 2 diastolic dysfunction, RV size is normal and systolic function mildly reduced, status post mitral valve repair  Heart Catheterization:       Assessment & Plan:   Chronic combined systolic (congestive) and diastolic (congestive) heart failure (HCC)  Morbid obesity due to excess calories (HCC)  OSA (obstructive sleep apnea)  Centrilobular emphysema ([place])  Diaphragm paralysis  Discussion: Centrilobular emphysema complicated by obesity and a paralyzed right hemidiaphragm.  Obstructive sleep apnea also contributing and made more significant in the context of his obesity and hemidiaphragm paralysis.  The major treatment is weight loss and exercise which I encouraged today.  Though he has centrilobular emphysema he has no significant airflow obstruction nor does he have a severe COPD phenotype.  Judithann Sauger seems to be helpful so I think it is reasonable to can continue that for now given all the medical problems that he has.  Plan: Centrilobular emphysema: Continue Breztri 2 puffs twice daily as you are doing If symptoms improve, you do not need albuterol use then we could consider backing down on this  Obstructive sleep apnea: Continue using BiPAP nightly  Right hemidiaphragm paralysis: Weight loss encouraged Stay physically active Keep using BiPAP at night  Heart failure: Continue follow-up with the heart failure clinic  Follow-up with me in 1 year or sooner if needed.  Immunizations:  There is no immunization history on file for this patient.   Current Outpatient Medications:    apixaban  (ELIQUIS) 5 MG TABS tablet, Take 1 tablet (5 mg total) by mouth 2 (two) times daily., Disp: 180 tablet, Rfl: 3   atorvastatin (LIPITOR) 80 MG tablet, Take 1 tablet (80 mg total) by mouth daily., Disp: 90 tablet, Rfl: 3   Budeson-Glycopyrrol-Formoterol (BREZTRI AEROSPHERE) 160-9-4.8 MCG/ACT AERO, Inhale 2 puffs into the lungs in the morning and at bedtime., Disp: 10.7 g, Rfl: 4   carvedilol (COREG) 12.5 MG tablet, Take 1 tablet (12.5 mg total) by mouth 2 (two) times daily., Disp: 180 tablet, Rfl: 3   cyclobenzaprine (FLEXERIL) 5 MG tablet, Take 1 tablet (5 mg total) by mouth 3 (three) times daily as needed for muscle spasms., Disp: 14 tablet, Rfl: 0   empagliflozin (JARDIANCE) 10 MG TABS tablet, Take 1 tablet (10 mg total) by mouth daily before breakfast., Disp: 90 tablet, Rfl: 3   ezetimibe (ZETIA) 10 MG tablet, Take 1 tablet (10 mg total)  by mouth daily., Disp: 30 tablet, Rfl: 11   fenofibrate (TRICOR) 145 MG tablet, Take 1 tablet (145 mg total) by mouth daily., Disp: 90 tablet, Rfl: 3   ferrous sulfate 325 (65 FE) MG tablet, Take 325 mg by mouth daily., Disp: , Rfl:    folic acid (FOLVITE) 1 MG tablet, Take 1 mg by mouth daily., Disp: , Rfl:    furosemide (LASIX) 40 MG tablet, Take 40 mg by mouth as needed., Disp: , Rfl:    potassium chloride SA (KLOR-CON M) 20 MEQ tablet, Take 1 tablet (20 mEq total) by mouth 3 (three) times a week on Monday, Wednesday, and Friday., Disp: 60 tablet, Rfl: 3   sacubitril-valsartan (ENTRESTO) 97-103 MG, Take 1 tablet by mouth 2 (two) times daily., Disp: 180 tablet, Rfl: 3   spironolactone (ALDACTONE) 25 MG tablet, Take 1 tablet (25 mg total) by mouth in the morning., Disp: 90 tablet, Rfl: 2

## 2022-02-24 NOTE — Progress Notes (Signed)
Full PFT completed today 

## 2022-02-24 NOTE — Patient Instructions (Signed)
Centrilobular emphysema: Continue Breztri 2 puffs twice daily as you are doing If symptoms improve, you do not need albuterol use then we could consider backing down on this  Obstructive sleep apnea: Continue using BiPAP nightly  Right hemidiaphragm paralysis: Weight loss encouraged Stay physically active Keep using BiPAP at night  Heart failure: Continue follow-up with the heart failure clinic  Follow-up with me in 1 year or sooner if needed.

## 2022-02-24 NOTE — Addendum Note (Signed)
Addended by: Lorretta Harp on: 02/24/2022 03:03 PM   Modules accepted: Orders

## 2022-02-26 ENCOUNTER — Ambulatory Visit (HOSPITAL_COMMUNITY): Payer: Medicare Other

## 2022-03-02 ENCOUNTER — Other Ambulatory Visit (HOSPITAL_COMMUNITY): Payer: Self-pay

## 2022-03-03 ENCOUNTER — Ambulatory Visit (HOSPITAL_COMMUNITY): Payer: Medicare Other

## 2022-03-05 ENCOUNTER — Ambulatory Visit (HOSPITAL_COMMUNITY): Payer: Medicare Other

## 2022-03-10 ENCOUNTER — Ambulatory Visit (HOSPITAL_COMMUNITY): Payer: Medicare Other

## 2022-03-12 ENCOUNTER — Ambulatory Visit (HOSPITAL_COMMUNITY): Payer: Medicare Other

## 2022-03-17 ENCOUNTER — Ambulatory Visit (HOSPITAL_COMMUNITY): Payer: Medicare Other

## 2022-03-18 ENCOUNTER — Ambulatory Visit (HOSPITAL_COMMUNITY)
Admission: RE | Admit: 2022-03-18 | Discharge: 2022-03-18 | Disposition: A | Discharge status: Home / Self Care | Payer: Medicare Other | Source: Ambulatory Visit | Attending: Family Medicine | Admitting: Family Medicine

## 2022-03-18 ENCOUNTER — Encounter (HOSPITAL_COMMUNITY): Payer: Self-pay

## 2022-03-18 VITALS — BP 92/50 | HR 92 | Wt 284.6 lb

## 2022-03-18 DIAGNOSIS — Z79899 Other long term (current) drug therapy: Secondary | ICD-10-CM | POA: Insufficient documentation

## 2022-03-18 DIAGNOSIS — I340 Nonrheumatic mitral (valve) insufficiency: Secondary | ICD-10-CM | POA: Diagnosis not present

## 2022-03-18 DIAGNOSIS — Z7984 Long term (current) use of oral hypoglycemic drugs: Secondary | ICD-10-CM | POA: Insufficient documentation

## 2022-03-18 DIAGNOSIS — I5022 Chronic systolic (congestive) heart failure: Secondary | ICD-10-CM | POA: Diagnosis not present

## 2022-03-18 DIAGNOSIS — G4733 Obstructive sleep apnea (adult) (pediatric): Secondary | ICD-10-CM | POA: Insufficient documentation

## 2022-03-18 DIAGNOSIS — I2510 Atherosclerotic heart disease of native coronary artery without angina pectoris: Secondary | ICD-10-CM | POA: Insufficient documentation

## 2022-03-18 DIAGNOSIS — E669 Obesity, unspecified: Secondary | ICD-10-CM | POA: Insufficient documentation

## 2022-03-18 DIAGNOSIS — J449 Chronic obstructive pulmonary disease, unspecified: Secondary | ICD-10-CM | POA: Insufficient documentation

## 2022-03-18 DIAGNOSIS — Z86711 Personal history of pulmonary embolism: Secondary | ICD-10-CM

## 2022-03-18 DIAGNOSIS — Z6839 Body mass index (BMI) 39.0-39.9, adult: Secondary | ICD-10-CM | POA: Insufficient documentation

## 2022-03-18 DIAGNOSIS — I255 Ischemic cardiomyopathy: Secondary | ICD-10-CM | POA: Insufficient documentation

## 2022-03-18 DIAGNOSIS — Z7901 Long term (current) use of anticoagulants: Secondary | ICD-10-CM | POA: Diagnosis not present

## 2022-03-18 DIAGNOSIS — Z9889 Other specified postprocedural states: Secondary | ICD-10-CM

## 2022-03-18 DIAGNOSIS — I252 Old myocardial infarction: Secondary | ICD-10-CM | POA: Diagnosis not present

## 2022-03-18 DIAGNOSIS — E785 Hyperlipidemia, unspecified: Secondary | ICD-10-CM

## 2022-03-18 LAB — BASIC METABOLIC PANEL
Anion gap: 9 (ref 5–15)
BUN: 9 mg/dL (ref 6–20)
CO2: 24 mmol/L (ref 22–32)
Calcium: 9.3 mg/dL (ref 8.9–10.3)
Chloride: 105 mmol/L (ref 98–111)
Creatinine, Ser: 1.27 mg/dL — ABNORMAL HIGH (ref 0.61–1.24)
GFR, Estimated: >60 mL/min (ref >60)
Glucose, Bld: 163 mg/dL — ABNORMAL HIGH (ref 70–99)
Potassium: 3.4 mmol/L — ABNORMAL LOW (ref 3.5–5.1)
Sodium: 138 mmol/L (ref 135–145)

## 2022-03-18 NOTE — Patient Instructions (Signed)
It was great to see you today! No medication changes are needed at this time.   Labs today We will only contact you if something comes back abnormal or we need to make some changes. Otherwise no news is good news!  Your physician recommends that you schedule a follow-up appointment in: 6 months with Dr Aundra Dubin Please call in July for a August appointment   Do the following things EVERYDAY: Weigh yourself in the morning before breakfast. Write it down and keep it in a log. Take your medicines as prescribed Eat low salt foods--Limit salt (sodium) to 2000 mg per day.  Stay as active as you can everyday Limit all fluids for the day to less than 2 liters  At the [facility], you and your health needs are our priority. As part of our continuing mission to provide you with exceptional heart care, we have created designated Provider Care Teams. These Care Teams include your primary Cardiologist (physician) and Advanced Practice Providers (APPs- Physician Assistants and Nurse Practitioners) who all work together to provide you with the care you need, when you need it.   You may see any of the following providers on your designated Care Team at your next follow up: Dr Glori Bickers Dr Loralie Champagne Dr. Roxana Hires, NP Lyda Jester, [place] [facility] [place], [place] Forestine Na, NP Audry Riles, PharmD   Please be sure to bring in all your medications bottles to every appointment.    Thank you for choosing [facility]   If you have any questions or concerns before your next appointment please send [place] a message through [place] or call our office at [phone].    TO LEAVE A MESSAGE FOR THE NURSE SELECT OPTION 2, PLEASE LEAVE A MESSAGE INCLUDING: YOUR NAME DATE OF BIRTH CALL BACK NUMBER REASON FOR CALL**this is important as we prioritize the call backs  YOU WILL RECEIVE A CALL BACK THE SAME DAY AS  LONG AS YOU CALL BEFORE 4:00 PM .

## 2022-03-18 NOTE — Progress Notes (Signed)
PCP: Nicolette Bang, MD (Inactive) HF Cardiology: Dr Aundra Dubin   HPI: Joshua May is a 47 y.o. male AA male with a history of CAD dating back to 2007 when he had a PCI in [place]. In 2010 he presented with a STEMI and had an RCA DES placed. He followed up for a year but then didn't present again till Oct 2017 when he presented with a CFX infarct. Cath revealed 50% ISR of the RCA, total mCFX and 85% OM3. He had CFX PCI with DES and OM3 POBA. His EF then was 50-55%.  Admitted 02/27/17 with increased dyspnea and chest pain. CTA confirmed PE. ECHO completed and showed reduced EF 20-25%. Underwent LHC as noted below. [place] no evidence DVTs. CT surgery consulted for severe MR. Plan to optimize HF medications.Set up TEE as an outpatient. Discharge weight 222 pounds.   Admitted 06/2017 with scheduled MV repair. S/P Minimally invasive mitral valve repair on 6/19. ECHO 07/12/17 post surgery, no MR was noted but EF remained 25-30%. Tachycardic at the time of discharge. Discharge weight 257 pounds.   CPX was submaximal in 8/19, suggestive of marked deconditioning.  [place] in 9/19 showed relatively preserved cardiac index with only mildly elevated filling pressures.  Echo in 9/19 showed EF 30-35%. MDT ICD placed in 2019.   Echo in 8/21 showed EF 45%, RV normal, s/p MV repair with mean gradient 5 and MVA 1.78 cm^2 (mild mitral stenosis), trivial MR.   Echo in 11/22 showed EF 45-50%, s/p MV repair with no MR, moderate mitral stenosis with mean gradient 7 mmHg and MVA 1.12 by VTI, IVC normal, RV normal.   Echo 11/23 EF 45% with global hypokinesis, mild RV dysfunction, s/p MV repair with no MR and mild-moderate MS mean gradient 5 mmHg, IVC normal.   Follow up 11/23 with NYHA III symptoms and stable volume. Having atypical chest pain vs bronchospasm, and cardiac PET arranged to rule out ischemia.  Cardiac PET (1/24) shows EF 47%, old inferolateral MI, small area of ischemia at the apex. Overall low risk with minimal  ischemia and no indication for cath.   He returns today for followup of CHF and CAD.  He uses Bipap every night. Weight up 7 lbs.  He has been using his inhalers more recently.  He gets chest tightness at times, not associated with exertion. The chest tightness is relieved by inhaler use.  He is trying to walk more but gets short of breath if he walks fast.  Short of breath with stairs.  No orthopnea/PND.  No palpitations.  He takes Lasix about twice a week.   Today he returns for HF follow up. Overall feeling fine. He has SOB if he walks fast, does OK if he takes his time. Unable to afford co-pay for Pulmonary Rehab.  No further chest pain. Denies palpitations, abnormal bleeding, dizziness, edema, or PND/Orthopnea. Appetite ok. No fever or chills. He does not weigh regularly at home. Taking all medications. Wears BiPap. Takes Lasix MWF  MDT device interrogation: Stable thoracic impedance, no AF/V, 3-4 hr/day activity.   ECG (personally reviewed): none ordered today.  Labs (7/19): K 4.5, creatinine 0.95, LDL 51 Labs (8/19): K 3.8, creatinine 0.99 Labs (5/21): K 4.8, creatinine 1.08, LDL 34, TGs 381 Labs (10/21): LDL 78, HDL 74, TGs 178 Labs (12/21): K 4.2, creatinine 1.3 Labs (3/22): K 3.7, creatinine 1.12 Labs (4/22): LDL 89 Labs (10/22): AST 36, ALT 52, LDL 74, TGs 107 Labs (11/22): K 4, creatinine 1.03 Labs (  7/23): LDL 59, BNP 57, K 4.2, creatinine 0.92 Labs (11/23): K 4.1, creatinine 1.19  PMH: 1. CAD: Initial PCI in 2007 in [place], inferior MI.   - Inferior STEMI 2010 with RCA PCI.  - CFx infarct in 2017 with LCx DES and OM3 POBA.  - LHC (2/19): Nonobstructive CAD.  - Cardiac PET (1/24): EF 47%, old inferolateral MI, small area of ischemia at apex, overall low risk with minimal ischemia. 2. 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  - TEE (4/19): LVEF 35-40%, Severe MR with PISA ERO 0.43 cm^2. Suspect infarct related MR with tethering of the posterior leaflet.  - Minimally invasive MV  repair in 6/19.   3. Chronic systolic CHF: Ischemic cardiomyopathy. Medtronic ICD.  - Echo (2/19): EF 25-30%, diffuse HK with inferolateral AK, mildly decreased RV systolic function with D-shaped septum, moderate-severe infarct-related MR.  - Echo (6/19): EF 25-30%, apical and inferolateral hypokinesis, MV repair with mean gradient 9 mmHg with no MR, mildly decreased RV systolic function.  - CPX (8/19): VO2 13.2, VE/VCO2 slope 28, RER 0.87.  This study was suggestive of severe deconditioning.  - RHC (9/19): mean RA 9, PA 39/23 mean 24, mean PCWP 17, CI 2.2, PVR 1.35 WU - Echo (9/19): EF 30-35%, diffuse hypokinesis, normal RV, s/p MV repair with mean gradient 6 mmHg and PHT 104 msec => probably no significant stenosis.  - Echo (8/21): EF 45%, RV normal, s/p MV repair with mean gradient 5 and MVA 1.78 cm^2 (mild mitral stenosis), trivial MR.  - Echo (11/22): EF 45-50%, s/p MV repair with no MR, moderate mitral stenosis with mean gradient 7 mmHg and MVA 1.12 by VTI, IVC normal, RV normal.  - Echo (11/23): EF 45% with global hypokinesis, mild RV dysfunction, s/p MV repair with no MR and mild-moderate MS mean gradient 5 mmHg, IVC normal.  4. Hyperlipidemia 5. PE: 2/19.  6. COPD: Moderate COPD on 6/19 PFTs.  7. OSA: Uses Bipap  Review of systems complete and found to be negative unless listed in HPI.    Social History   Socioeconomic History   Marital status: Married    Spouse name: Not on file   Number of children: Not on file   Years of education: Not on file   Highest education level: Not on file  Occupational History   Not on file  Tobacco Use   Smoking status: Former    Packs/day: 0.50    Years: 17.00    Total pack years: 8.50    Types: Cigarettes   Smokeless tobacco: Never   Tobacco comments:    quit since heart attack in 10/2015  Vaping Use   Vaping Use: Never used  Substance and Sexual Activity   Alcohol use: Not Currently   Drug use: Not Currently    Types: Marijuana    Sexual activity: Not on file  Other Topics Concern   Not on file  Social History Narrative   The patient is married with two children.   Social Determinants of Health   Financial Resource Strain: Not on file  Food Insecurity: Not on file  Transportation Needs: Not on file  Physical Activity: Not on file  Stress: Not on file  Social Connections: Not on file  Intimate Partner Violence: Not on file   Family History  Problem Relation Age of Onset   Heart attack Sister 73       S/P CABG   Heart attack Father    Deep vein thrombosis Father  Current Outpatient Medications  Medication Sig Dispense Refill   albuterol (VENTOLIN HFA) 108 (90 Base) MCG/ACT inhaler Inhale 2 puffs into the lungs every 6 (six) hours as needed for wheezing or shortness of breath. Use this with a spacer to get Dufner effect when needed. 18 g 3   apixaban (ELIQUIS) 5 MG TABS tablet Take 1 tablet (5 mg total) by mouth 2 (two) times daily. 180 tablet 3   atorvastatin (LIPITOR) 80 MG tablet Take 1 tablet (80 mg total) by mouth daily. 90 tablet 3   Budeson-Glycopyrrol-Formoterol (BREZTRI AEROSPHERE) 160-9-4.8 MCG/ACT AERO Inhale 2 puffs into the lungs in the morning and at bedtime. 10.7 g 4   carvedilol (COREG) 12.5 MG tablet Take 1 tablet (12.5 mg total) by mouth 2 (two) times daily. 180 tablet 3   cyclobenzaprine (FLEXERIL) 5 MG tablet Take 1 tablet (5 mg total) by mouth 3 (three) times daily as needed for muscle spasms. 14 tablet 0   empagliflozin (JARDIANCE) 10 MG TABS tablet Take 1 tablet (10 mg total) by mouth daily before breakfast. 90 tablet 3   ezetimibe (ZETIA) 10 MG tablet Take 1 tablet (10 mg total) by mouth daily. 30 tablet 11   fenofibrate (TRICOR) 145 MG tablet Take 1 tablet (145 mg total) by mouth daily. 90 tablet 3   ferrous sulfate 325 (65 FE) MG tablet Take 325 mg by mouth daily.     folic acid (FOLVITE) 1 MG tablet Take 1 mg by mouth daily.     furosemide (LASIX) 40 MG tablet Take 40 mg by mouth  as needed.     potassium chloride SA (KLOR-CON M) 20 MEQ tablet Take 1 tablet (20 mEq total) by mouth 3 (three) times a week on Monday, Wednesday, and Friday. 60 tablet 3   sacubitril-valsartan (ENTRESTO) 97-103 MG Take 1 tablet by mouth 2 (two) times daily. 180 tablet 3   Spacer/Aero-Holding Chambers (AEROCHAMBER PLUS WITH MASK) inhaler use as directed 1 each 0   spironolactone (ALDACTONE) 25 MG tablet Take 1 tablet (25 mg total) by mouth in the morning. 90 tablet 2   No current facility-administered medications for this encounter.   BP (!) 92/50   Pulse 92   Wt 129.1 kg (284 lb 9.6 oz)   SpO2 97%   BMI 39.69 kg/m   Wt Readings from Last 3 Encounters:  03/18/22 129.1 kg (284 lb 9.6 oz)  02/24/22 130.1 kg (286 lb 12.8 oz)  01/23/22 129.7 kg (285 lb 15 oz)    PHYSICAL EXAM: General:  NAD. No resp difficulty, walked into clinic HEENT: Normal Neck: Supple. No JVD. Carotids 2+ bilat; no bruits. No lymphadenopathy or thryomegaly appreciated. Cor: PMI nondisplaced. Regular rate & rhythm. No rubs, gallops or murmurs. Lungs: Clear Abdomen: Soft, nontender, nondistended. No hepatosplenomegaly. No bruits or masses. Good bowel sounds. Extremities: No cyanosis, clubbing, rash, edema Neuro: Alert & oriented x 3, cranial nerves grossly intact. Moves all 4 extremities w/o difficulty. Affect pleasant.  ASSESSMENT & PLAN: 1. Chronic systolic CHF: Ischemic cardiomyopathy. Echo in 6/19 with EF 25-30%, echo 9/19 with EF 30-35%.  Medtronic ICD.  Echo in 8/21 showed EF up to 45%.  Echo in 11/22 showed EF 45-50%. Echo today showed  EF 45% with global hypokinesis, mild RV dysfunction, s/p MV repair with no MR and mild-moderate MS mean gradient 5 mmHg, IVC normal.  He is not volume overloaded on exam or by Optivol. Improved NYHA class II symptoms, COPD may be playing a larger role than CHF.  -  Continue spironolactone 25 mg daily.   - Continue Coreg 12.5 mg bid.  No BP room to increase. - Continue Entresto  97/103 bid.   - Continue Lasix (takes 3x/week). BMET today. - Continue empagliflozin 10 mg daily.  2. CAD: Stents in LCx and RCA. LHC 02/2017 showed nonobstructive disease. Cardiac PET showed EF 47%, minimal ischemia, low risk. No further CP. - Continue atorvastatin 80 mg daily, good lipids in 7/23.   - He is on apixaban, so no ASA 81.  3. PE: In 2019, no definite trigger. Father with h/o VTE.  He should likely have long-term anticoagulation.   - Continue Eliquis. No bleeding issues. 4. Mitral regurgitation: Ischemic MR.  S/p minimally invasive MV repair in 6/19. Post-op echo 6/19 showed no MR but mean gradient 9 mmHg across MV. Echo (9/19) with mean MV gradient 6 mmHg but PHT short at 104 msec, probably no significant stenosis.  Echo in 8/21 showed mean gradient 5 mmHg across repaired MV with MVA 1.78 cm^2.  Echo in 11/22 showed mean gradient 7 mmHg across MV with MVA 1.12 cm^2, possible moderate mitral stenosis.  Only trivial MR.  Echo in 11/23 with no MR, mild-moderate MS with mean gradient 5 mmHg.  5. 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 Contributory to his dyspnea.  - He is followed by Pulmonary. - Unable to afford co-pay for pulmonary rehab.  - Needs weight loss and exercise 6. Hyperlipidemia: Lipids ok in 7/23.  - Continue atorvastatin 80 mg daily.  - Continue Zetia 10 mg daily.   7. OSA: Using Bipap.   8. Obesity: Body mass index is 39.69 kg/m. - Consider referral to PharmD for semaglutide. He is not diabetic, A1c 5.3, so insurance coverage may be barrier.  Follow up in 6 months with Dr. Wynema Birch [facility] FNP-BC 03/18/2022

## 2022-03-19 ENCOUNTER — Ambulatory Visit (HOSPITAL_COMMUNITY): Payer: Medicare Other

## 2022-03-23 ENCOUNTER — Telehealth (HOSPITAL_COMMUNITY): Payer: Self-pay

## 2022-03-23 ENCOUNTER — Other Ambulatory Visit (HOSPITAL_COMMUNITY): Payer: Self-pay | Admitting: Cardiology

## 2022-03-23 ENCOUNTER — Other Ambulatory Visit (HOSPITAL_COMMUNITY): Payer: Self-pay

## 2022-03-23 DIAGNOSIS — I5022 Chronic systolic (congestive) heart failure: Secondary | ICD-10-CM

## 2022-03-23 MED ORDER — POTASSIUM CHLORIDE CRYS ER 20 MEQ PO TBCR
20.0000 meq | EXTENDED_RELEASE_TABLET | Freq: Every day | ORAL | 11 refills | Status: DC
  Filled 2022-03-23: qty 60, fill #0
  Filled 2022-05-01: qty 60, 60d supply, fill #0
  Filled 2022-07-06: qty 60, 60d supply, fill #1
  Filled 2022-09-10: qty 60, 60d supply, fill #2
  Filled 2022-11-09: qty 60, 60d supply, fill #3
  Filled 2023-01-11: qty 60, 60d supply, fill #4
  Filled 2023-03-18: qty 60, 60d supply, fill #5

## 2022-03-23 NOTE — Telephone Encounter (Signed)
Patient's med has been changed and up dated in his chart. In addition, pt's lab order placed and lab appointment has been scheduled and Pt aware, agreeable, and verbalized understanding.

## 2022-03-24 ENCOUNTER — Ambulatory Visit (HOSPITAL_COMMUNITY): Payer: Medicare Other

## 2022-03-24 ENCOUNTER — Other Ambulatory Visit (HOSPITAL_COMMUNITY): Payer: Self-pay

## 2022-03-24 MED ORDER — EMPAGLIFLOZIN 10 MG PO TABS
10.0000 mg | ORAL_TABLET | Freq: Every day | ORAL | 1 refills | Status: DC
  Filled 2022-03-24: qty 90, 90d supply, fill #0
  Filled 2022-06-22: qty 90, 90d supply, fill #1

## 2022-03-26 ENCOUNTER — Ambulatory Visit (HOSPITAL_COMMUNITY): Payer: Medicare Other

## 2022-04-01 ENCOUNTER — Ambulatory Visit (HOSPITAL_COMMUNITY)
Admission: EM | Admit: 2022-04-01 | Discharge: 2022-04-01 | Disposition: A | Discharge status: Home / Self Care | Payer: Medicare Other | Attending: Emergency Medicine | Admitting: Emergency Medicine

## 2022-04-01 ENCOUNTER — Ambulatory Visit: Payer: Self-pay

## 2022-04-01 ENCOUNTER — Encounter (HOSPITAL_COMMUNITY): Payer: Self-pay

## 2022-04-01 DIAGNOSIS — R81 Glycosuria: Secondary | ICD-10-CM

## 2022-04-01 DIAGNOSIS — R310 Gross hematuria: Secondary | ICD-10-CM | POA: Diagnosis present

## 2022-04-01 LAB — BASIC METABOLIC PANEL
Anion gap: 7 (ref 5–15)
BUN: 17 mg/dL (ref 6–20)
CO2: 24 mmol/L (ref 22–32)
Calcium: 9.1 mg/dL (ref 8.9–10.3)
Chloride: 107 mmol/L (ref 98–111)
Creatinine, Ser: 1.09 mg/dL (ref 0.61–1.24)
GFR, Estimated: >60 mL/min (ref >60)
Glucose, Bld: 149 mg/dL — ABNORMAL HIGH (ref 70–99)
Potassium: 4.2 mmol/L (ref 3.5–5.1)
Sodium: 138 mmol/L (ref 135–145)

## 2022-04-01 LAB — POCT URINALYSIS DIPSTICK, ED / UC
Bilirubin Urine: NEGATIVE
Glucose, UA: 500 mg/dL — AB
Ketones, ur: NEGATIVE mg/dL
Leukocytes,Ua: NEGATIVE
Nitrite: NEGATIVE
Protein, ur: NEGATIVE mg/dL
Specific Gravity, Urine: 1.015 (ref 1.005–1.030)
Urobilinogen, UA: 1 mg/dL (ref 0.0–1.0)
pH: 5.5 (ref 5.0–8.0)

## 2022-04-01 LAB — CBG MONITORING, ED: Glucose-Capillary: 167 mg/dL — ABNORMAL HIGH (ref 70–99)

## 2022-04-01 NOTE — ED Provider Notes (Signed)
[place]    CSN: [id number] Arrival date & time: 04/01/22  1315      History   Chief Complaint Chief Complaint  Patient presents with   Dysuria    HPI Lorenzo Billiter Gambrel is a 47 y.o. male.   Patient presents to clinic for concerns over an episode of hematuria yesterday morning. Reports urination has been normal since then. Unable to describe in detail, noticed blood in toilet after urinating yesterday morning, reports it was dark. Denies flank pain or fever. Had some discomfort while urinating. Since this, urination has been regular without dysuria, denies flank pain. Denies N/V, SOB or CP. Had taken his lasix on Monday, has not taken it today. Is on Eliquis.   Reports a fall Monday, he slipped and landed on his bottom in the yard. Denies LOC, hitting head or flank. Denies pain from fall. Ambulatory.   The history is provided by the patient.  Dysuria Presenting symptoms: no dysuria and no penile discharge   Associated symptoms: hematuria   Associated symptoms: no abdominal pain, no fever and no urinary frequency     Past Medical History:  Diagnosis Date   Asthma    CAD (coronary artery disease)    Chronic systolic CHF (congestive heart failure) (HCC)    HLD (hyperlipidemia)    Qualifier: Diagnosis of  By: Percival Spanish, MD, Farrel Gordon     Hypercholesteremia    Inferior myocardial infarction [facility]) 2007   PCI of RCA   Inferior myocardial infarction ([place]) 03/31/2008   PCI of RCA with bare metal stent   Ischemic cardiomyopathy    Migraines    and dizziness   Mitral regurgitation    Obstructive sleep apnea    Qualifier: Diagnosis of  By: Percival Spanish, MD, FACC, James     Posterolateral myocardial infarction ([place]) 10/20/2015   PCI of LCx using DES   Pulmonary embolism ([place]) 02/28/2017   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   Carpentier-McCarthy Adams ring annuloplasty,   size 26     Tobacco abuse     Patient Active Problem List   Diagnosis Date Noted   S/P MVR  (mitral valve repair) 07/07/2017   Dental caries 05/04/2017   Chronic periodontitis 05/04/2017   Lupus anticoagulant disorder ([place]) [id number]   Chronic systolic CHF (congestive heart failure) (HCC)    Mitral regurgitation    Ischemic cardiomyopathy    Pulmonary embolism ([place]) 02/28/2017   CAD (coronary artery disease)    DYSPHAGIA UNSPECIFIED 09/04/2008   HLD (hyperlipidemia) 04/27/2008   Obstructive sleep apnea 04/27/2008   ASTHMA 04/26/2008    Past Surgical History:  Procedure Laterality Date   CARDIAC CATHETERIZATION N/A 10/20/2015   Procedure: Left Heart Cath and Coronary Angiography;  Surgeon: Burnell Blanks, MD;  Location: [place] [facility];  Service: Cardiovascular;  Laterality: N/A;   CARDIAC CATHETERIZATION N/A 10/20/2015   Procedure: Coronary Stent Intervention;  Surgeon: Burnell Blanks, MD;  Location: [place] [facility];  Service: Cardiovascular;  Laterality: N/A;   ICD IMPLANT N/A 11/18/2017   Procedure: ICD IMPLANT;  Surgeon: Constance Haw, MD;  Location: [place] [facility];  Service: Cardiovascular;  Laterality: N/A;   INTRAVASCULAR PRESSURE WIRE/FFR STUDY N/A 03/03/2017   Procedure: INTRAVASCULAR PRESSURE WIRE/FFR STUDY;  Surgeon: Leonie Man, MD;  Location: [place] [facility];  Service: Cardiovascular;  Laterality: N/A;   LEFT HEART CATH AND CORONARY ANGIOGRAPHY N/A 03/03/2017   Procedure: LEFT HEART CATH AND CORONARY ANGIOGRAPHY;  Surgeon: Ellyn Hack,  Leonie Green, MD;  Location: [place] [facility];  Service: Cardiovascular;  Laterality: N/A;   MITRAL VALVE REPAIR Right 07/07/2017   Procedure: MINIMALLY INVASIVE MITRAL VALVE REPAIR (MVR) using Carpentier-McCarthy Adams Ring size 26;  Surgeon: Rexene Alberts, MD;  Location: [place];  Service: Open Heart Surgery;  Laterality: Right;   MULTIPLE EXTRACTIONS WITH ALVEOLOPLASTY N/A 05/13/2017   Procedure: Extraction of tooth #'s [phone] with alveoloplasty and gross debridement of remaining teeth.;  Surgeon:  Lenn Cal, DDS;  Location: [place];  Service: Oral Surgery;  Laterality: N/A;   none     OTHER SURGICAL HISTORY     NONE   PATENT FORAMEN OVALE(PFO) CLOSURE N/A 07/07/2017   Procedure: PATENT FORAMEN OVALE (PFO) CLOSURE;  Surgeon: Rexene Alberts, MD;  Location: [place];  Service: Open Heart Surgery;  Laterality: N/A;   RIGHT HEART CATH N/A 09/22/2017   Procedure: RIGHT HEART CATH;  Surgeon: Larey Dresser, MD;  Location: [place] [facility];  Service: Cardiovascular;  Laterality: N/A;   TEE WITHOUT CARDIOVERSION N/A 04/19/2017   Procedure: TRANSESOPHAGEAL ECHOCARDIOGRAM (TEE);  Surgeon: Larey Dresser, MD;  Location[facility] ENDOSCOPY;  Service: Cardiovascular;  Laterality: N/A;   TEE WITHOUT CARDIOVERSION N/A 07/07/2017   Procedure: TRANSESOPHAGEAL ECHOCARDIOGRAM (TEE);  Surgeon: Rexene Alberts, MD;  Location: [place];  Service: Open Heart Surgery;  Laterality: N/A;       Home Medications    Prior to Admission medications   Medication Sig Start Date End Date Taking? Authorizing Provider  albuterol (VENTOLIN HFA) 108 (90 Base) MCG/ACT inhaler Inhale 2 puffs into the lungs every 6 (six) hours as needed for wheezing or shortness of breath. Use this with a spacer to get Schubach effect when needed. 02/24/22  Yes Juanito Doom, MD  apixaban (ELIQUIS) 5 MG TABS tablet Take 1 tablet (5 mg total) by mouth 2 (two) times daily. 04/17/21  Yes Larey Dresser, MD  atorvastatin (LIPITOR) 80 MG tablet Take 1 tablet (80 mg total) by mouth daily. 05/06/21  Yes Larey Dresser, MD  Budeson-Glycopyrrol-Formoterol (BREZTRI AEROSPHERE) 160-9-4.8 MCG/ACT AERO Inhale 2 puffs into the lungs in the morning and at bedtime. 01/05/22  Yes Juanito Doom, MD  carvedilol (COREG) 12.5 MG tablet Take 1 tablet (12.5 mg total) by mouth 2 (two) times daily. 05/08/21  Yes Rosita Fire, Brittainy M, PA-C  empagliflozin (JARDIANCE) 10 MG TABS tablet Take 1 tablet (10 mg total) by mouth daily before breakfast. 03/24/22  Yes Turner,  Eber Hong, MD  ezetimibe (ZETIA) 10 MG tablet Take 1 tablet (10 mg total) by mouth daily. 08/22/21 08/22/22 Yes Larey Dresser, MD  fenofibrate (TRICOR) 145 MG tablet Take 1 tablet (145 mg total) by mouth daily. 05/21/21  Yes Larey Dresser, MD  ferrous sulfate 325 (65 FE) MG tablet Take 325 mg by mouth daily.   Yes [provider]  folic acid (FOLVITE) 1 MG tablet Take 1 mg by mouth daily.   Yes [provider]  furosemide (LASIX) 40 MG tablet Take 40 mg by mouth as needed.   Yes [provider]  potassium chloride SA (KLOR-CON M) 20 MEQ tablet Take 1 tablet by mouth daily. 03/23/22  Yes Mil[place], Maricela Bo, FNP  sacubitril-valsartan (ENTRESTO) 97-103 MG Take 1 tablet by mouth 2 (two) times daily. 03/26/21 04/16/22 Yes Lyda Jester M, PA-C  Spacer/Aero-Holding Chambers (AEROCHAMBER PLUS WITH MASK) inhaler use as directed 02/24/22  Yes Juanito Doom, MD  spironolactone (ALDACTONE) 25  MG tablet Take 1 tablet (25 mg total) by mouth in the morning. 01/05/22  Yes Larey Dresser, MD  cyclobenzaprine (FLEXERIL) 5 MG tablet Take 1 tablet (5 mg total) by mouth 3 (three) times daily as needed for muscle spasms. 08/23/20   Larey Dresser, MD    Family History Family History  Problem Relation Age of Onset   Heart attack Sister 28       S/P CABG   Heart attack Father    Deep vein thrombosis Father     Social History Social History   Tobacco Use   Smoking status: Former    Packs/day: 0.50    Years: 17.00    Total pack years: 8.50    Types: Cigarettes   Smokeless tobacco: Never   Tobacco comments:    quit since heart attack in 10/2015  Vaping Use   Vaping Use: Never used  Substance Use Topics   Alcohol use: Not Currently   Drug use: Not Currently    Types: Marijuana     Allergies   Shellfish allergy   Review of Systems Review of Systems  Constitutional:  Negative for fatigue and fever.  HENT:  Negative for sore throat.   Respiratory:  Negative for  cough and shortness of breath.   Cardiovascular:  Negative for chest pain.  Gastrointestinal:  Negative for abdominal pain.  Genitourinary:  Positive for hematuria. Negative for decreased urine volume, difficulty urinating, dysuria, frequency and penile discharge.  Musculoskeletal:  Negative for back pain.  Neurological:  Negative for syncope.     Physical Exam Triage Vital Signs ED Triage Vitals  Enc Vitals Group     BP 04/01/22 1424 91/60     Pulse Rate 04/01/22 1424 77     Resp 04/01/22 1424 12     Temp 04/01/22 1424 98 F (36.7 C)     Temp src --      SpO2 04/01/22 1424 94 %     Weight --      Height --      Head Circumference --      Peak Flow --      Pain Score 04/01/22 1421 0     Pain Loc --      Pain Edu? --      Excl. in [employer]? --    No data found.  Updated Vital Signs BP 91/60 (BP Location: Left Arm)   Pulse 77   Temp 98 F (36.7 C)   Resp 12   SpO2 94%   Visual Acuity Right Eye Distance:   Left Eye Distance:   Bilateral Distance:    Right Eye Near:   Left Eye Near:    Bilateral Near:     Physical Exam Vitals and nursing note reviewed.  Constitutional:      General: He is not in acute distress.    Appearance: He is well-developed.  HENT:     Head: Normocephalic and atraumatic.     Right Ear: External ear normal.     Left Ear: External ear normal.     Mouth/Throat:     Mouth: Mucous membranes are moist.  Eyes:     Conjunctiva/sclera: Conjunctivae normal.  Cardiovascular:     Rate and Rhythm: Normal rate and regular rhythm.     Heart sounds: Normal heart sounds, S1 normal and S2 normal. No murmur heard. Pulmonary:     Effort: Pulmonary effort is normal. No respiratory distress.     Breath sounds: Normal breath  sounds.     Comments: Lungs vesicular posteriorly. Abdominal:     General: Abdomen is flat.     Palpations: Abdomen is soft.     Tenderness: There is no abdominal tenderness. There is no right CVA tenderness, left CVA tenderness,  guarding or rebound.     Hernia: No hernia is present.  Musculoskeletal:        General: No swelling. Normal range of motion.     Cervical back: Normal range of motion and neck supple.  Skin:    General: Skin is warm and dry.     Capillary Refill: Capillary refill takes less than 2 seconds.  Neurological:     Mental Status: He is alert.  Psychiatric:        Mood and Affect: Mood normal.        Behavior: Behavior is cooperative.      UC Treatments / Results  Labs (all labs ordered are listed, but only abnormal results are displayed) Labs Reviewed  POCT URINALYSIS DIPSTICK, ED / UC - Abnormal; Notable for the following components:      Result Value   Glucose, UA 500 (*)    Hgb urine dipstick TRACE (*)    All other components within normal limits  CBG MONITORING, ED - Abnormal; Notable for the following components:   Glucose-Capillary 167 (*)    All other components within normal limits  URINE CULTURE  BASIC METABOLIC PANEL    EKG   Radiology No results found.  Procedures Procedures (including critical care time)  Medications Ordered in UC Medications - No data to display  Initial Impression / Assessment and Plan / UC Course  I have reviewed the triage vital signs and the nursing notes.  Pertinent labs & imaging results that were available during my care of the patient were reviewed by me and considered in my medical decision making (see chart for details).  Vitals in triage reviewed, patient is hemodynamically stable.  Blood pressure consistent with previous values.  Afebrile.  On Eliquis for mitral valve repair. Fall on Monday does not seem related as he did not hit his back or flank. Urinalysis in clinic positive for blood and glucose, CBG 167, had ginger ale in clinic, A1C 3 months prior of 5.3.  Will send urine for culture due to gross hematuria.  Checking BMP for kidney function, creatinine was borderline on 03/18/22.  Has a follow-up appointment with cardiology on  the 18th, advised to keep this.  Advised to return to clinic or seek immediate care if symptoms worsen, red flag symptoms develop, or any changes.  Patient verbalized understanding, no questions at this time.    Final Clinical Impressions(s) / UC Diagnoses   Final diagnoses:  Gross hematuria     Discharge Instructions      Your urine today was positive for blood and glucose.  Your blood sugar was 167.  We are sending your urine off for culture and we will call if antibiotics are indicated.  1 episode of hematuria could be due to your Eliquis, please keep your follow-up appointment with cardiology.  We are checking your kidney function today in clinic and we will call if further follow-up is indicated.  Please return to clinic or seek immediate care if you develop fever, start urinating blood again, or any changes in condition.     ED Prescriptions   None    I have reviewed the PDMP during this encounter.   Aidynn Krenn, [place] N, [place] 04/01/22 1606

## 2022-04-01 NOTE — Telephone Encounter (Signed)
      Chief Complaint: Blood in urine x 1. Requests to be worked in. Symptoms: Above Frequency: Yesterday Pertinent Negatives: Patient denies  Disposition: [] ED /[] Urgent Care (no appt availability in office) / [] Appointment(In office/virtual)/ []  [place] [facility]/ [] Home Care/ [] Refused Recommended Disposition /[] [place] [facility]/ [x]  Follow-up with PCP Additional Notes: No answer on FC line. Please advise pt.  Answer Assessment - Initial Assessment Questions 1. COLOR of URINE: "Describe the color of the urine."  (e.g., tea-colored, pink, red, bloody) "Do you have blood clots in your urine?" (e.g., none, pea, grape, small coin)     Dark red 2. ONSET: "When did the bleeding start?"      Yesterday 3. EPISODES: "How many times has there been blood in the urine?" or "How many times today?"      X 1  4. PAIN with URINATION: "Is there any pain with passing your urine?" If Yes, ask: "How bad is the pain?"  (Scale 1-10; or mild, moderate, severe)    - MILD: Complains slightly about urination hurting.    - MODERATE: Interferes with normal activities.      - SEVERE: Excruciating, unwilling or unable to urinate because of the pain.      Yes 5. FEVER: "Do you have a fever?" If Yes, ask: "What is your temperature, how was it measured, and when did it start?"     No 6. ASSOCIATED SYMPTOMS: "Are you passing urine more frequently than usual?"     No 7. OTHER SYMPTOMS: "Do you have any other symptoms?" (e.g., back/flank pain, abdomen pain, vomiting)     Stomach pain 8. PREGNANCY: "Is there any chance you are pregnant?" "When was your last menstrual period?"     N/a  Protocols used: Urine - Blood In-A-AH

## 2022-04-01 NOTE — ED Triage Notes (Signed)
Pt is currently on lasix , pt had blood in urine yesterday .

## 2022-04-01 NOTE — Discharge Instructions (Signed)
Your urine today was positive for blood and glucose.  Your blood sugar was 167.  We are sending your urine off for culture and we will call if antibiotics are indicated.  1 episode of hematuria could be due to your Eliquis, please keep your follow-up appointment with cardiology.  We are checking your kidney function today in clinic and we will call if further follow-up is indicated.  Please return to clinic or seek immediate care if you develop fever, start urinating blood again, or any changes in condition.

## 2022-04-01 NOTE — Telephone Encounter (Signed)
Patient was called  back and encouraged to go to UC for a quick er appt. Dr. Redmond Pulling is out today

## 2022-04-02 LAB — URINE CULTURE: Culture: NO GROWTH

## 2022-04-06 ENCOUNTER — Ambulatory Visit (HOSPITAL_COMMUNITY)
Admission: RE | Admit: 2022-04-06 | Discharge: 2022-04-06 | Disposition: A | Discharge status: Home / Self Care | Payer: Medicare Other | Source: Ambulatory Visit | Attending: Cardiology | Admitting: Cardiology

## 2022-04-06 DIAGNOSIS — I5022 Chronic systolic (congestive) heart failure: Secondary | ICD-10-CM | POA: Insufficient documentation

## 2022-04-06 LAB — BASIC METABOLIC PANEL
Anion gap: 5 (ref 5–15)
BUN: 17 mg/dL (ref 6–20)
CO2: 25 mmol/L (ref 22–32)
Calcium: 8.9 mg/dL (ref 8.9–10.3)
Chloride: 108 mmol/L (ref 98–111)
Creatinine, Ser: 1.11 mg/dL (ref 0.61–1.24)
GFR, Estimated: >60 mL/min (ref >60)
Glucose, Bld: 86 mg/dL (ref 70–99)
Potassium: 4 mmol/L (ref 3.5–5.1)
Sodium: 138 mmol/L (ref 135–145)

## 2022-04-21 ENCOUNTER — Other Ambulatory Visit (HOSPITAL_COMMUNITY): Payer: Self-pay | Admitting: Cardiology

## 2022-04-21 ENCOUNTER — Other Ambulatory Visit (HOSPITAL_COMMUNITY): Payer: Self-pay

## 2022-04-21 MED ORDER — ENTRESTO 97-103 MG PO TABS
1.0000 | ORAL_TABLET | Freq: Two times a day (BID) | ORAL | 3 refills | Status: DC
  Filled 2022-04-21: qty 180, 90d supply, fill #0
  Filled 2022-07-20: qty 180, 90d supply, fill #1
  Filled 2022-10-26: qty 180, 90d supply, fill #2
  Filled 2023-02-03: qty 180, 90d supply, fill #3

## 2022-05-01 ENCOUNTER — Other Ambulatory Visit (HOSPITAL_COMMUNITY): Payer: Self-pay

## 2022-05-04 ENCOUNTER — Other Ambulatory Visit: Payer: Self-pay | Admitting: Cardiology

## 2022-05-04 ENCOUNTER — Other Ambulatory Visit (HOSPITAL_COMMUNITY): Payer: Self-pay

## 2022-05-04 DIAGNOSIS — I5022 Chronic systolic (congestive) heart failure: Secondary | ICD-10-CM

## 2022-05-04 MED ORDER — ATORVASTATIN CALCIUM 80 MG PO TABS
80.0000 mg | ORAL_TABLET | Freq: Every day | ORAL | 3 refills | Status: DC
  Filled 2022-05-04: qty 90, 90d supply, fill #0
  Filled 2022-08-10: qty 90, 90d supply, fill #1
  Filled 2022-11-09: qty 90, 90d supply, fill #2
  Filled 2023-02-03: qty 90, 90d supply, fill #3

## 2022-05-07 ENCOUNTER — Ambulatory Visit (INDEPENDENT_AMBULATORY_CARE_PROVIDER_SITE_OTHER): Payer: Medicare Other

## 2022-05-07 DIAGNOSIS — I255 Ischemic cardiomyopathy: Secondary | ICD-10-CM

## 2022-05-07 LAB — CUP PACEART REMOTE DEVICE CHECK
Battery Remaining Longevity: 91 mo
Battery Voltage: 3.01 V
Brady Statistic RV Percent Paced: 0.01 %
Date Time Interrogation Session: 20240418033523
HighPow Impedance: 74 Ohm
Implantable Lead Connection Status: 753985
Implantable Lead Implant Date: 20191031
Implantable Lead Location: 753860
Implantable Lead Model: 6935
Implantable Pulse Generator Implant Date: 20191031
Lead Channel Impedance Value: 361 Ohm
Lead Channel Impedance Value: 399 Ohm
Lead Channel Pacing Threshold Amplitude: 0.625 V
Lead Channel Pacing Threshold Pulse Width: 0.4 ms
Lead Channel Sensing Intrinsic Amplitude: 10.25 mV
Lead Channel Sensing Intrinsic Amplitude: 10.25 mV
Lead Channel Setting Pacing Amplitude: 2 V
Lead Channel Setting Pacing Pulse Width: 0.4 ms
Lead Channel Setting Sensing Sensitivity: 0.45 mV
Zone Setting Status: 755011
Zone Setting Status: 755011

## 2022-05-09 ENCOUNTER — Other Ambulatory Visit (HOSPITAL_COMMUNITY): Payer: Self-pay | Admitting: Cardiology

## 2022-05-11 ENCOUNTER — Other Ambulatory Visit (HOSPITAL_COMMUNITY): Payer: Self-pay

## 2022-05-11 MED ORDER — APIXABAN 5 MG PO TABS
5.0000 mg | ORAL_TABLET | Freq: Two times a day (BID) | ORAL | 3 refills | Status: DC
  Filled 2022-05-11: qty 180, 90d supply, fill #0
  Filled 2022-08-11: qty 180, 90d supply, fill #1
  Filled 2022-11-16: qty 180, 90d supply, fill #2
  Filled 2023-02-17: qty 180, 90d supply, fill #3

## 2022-05-12 ENCOUNTER — Other Ambulatory Visit (HOSPITAL_COMMUNITY): Payer: Self-pay

## 2022-05-15 ENCOUNTER — Other Ambulatory Visit (HOSPITAL_COMMUNITY): Payer: Self-pay

## 2022-05-18 ENCOUNTER — Other Ambulatory Visit (HOSPITAL_COMMUNITY): Payer: Self-pay | Admitting: Cardiology

## 2022-05-18 ENCOUNTER — Other Ambulatory Visit: Payer: Self-pay

## 2022-05-18 ENCOUNTER — Other Ambulatory Visit (HOSPITAL_COMMUNITY): Payer: Self-pay

## 2022-05-18 DIAGNOSIS — I5022 Chronic systolic (congestive) heart failure: Secondary | ICD-10-CM

## 2022-05-18 MED ORDER — CARVEDILOL 12.5 MG PO TABS
12.5000 mg | ORAL_TABLET | Freq: Two times a day (BID) | ORAL | 3 refills | Status: DC
  Filled 2022-05-18: qty 180, 90d supply, fill #0

## 2022-05-18 MED ORDER — FENOFIBRATE 145 MG PO TABS
145.0000 mg | ORAL_TABLET | Freq: Every day | ORAL | 3 refills | Status: DC
  Filled 2022-05-18: qty 90, 90d supply, fill #0
  Filled 2022-08-25: qty 90, 90d supply, fill #1
  Filled 2022-11-16: qty 90, 90d supply, fill #2
  Filled 2023-02-17: qty 90, 90d supply, fill #3

## 2022-05-18 MED FILL — Furosemide Tab 40 MG: ORAL | 88 days supply | Qty: 38 | Fill #0 | Status: AC

## 2022-06-08 NOTE — Progress Notes (Signed)
Remote ICD transmission.   

## 2022-07-20 ENCOUNTER — Other Ambulatory Visit (HOSPITAL_COMMUNITY): Payer: Self-pay

## 2022-08-06 ENCOUNTER — Ambulatory Visit (INDEPENDENT_AMBULATORY_CARE_PROVIDER_SITE_OTHER): Payer: Medicare Other

## 2022-08-06 DIAGNOSIS — I255 Ischemic cardiomyopathy: Secondary | ICD-10-CM

## 2022-08-06 LAB — CUP PACEART REMOTE DEVICE CHECK
Battery Remaining Longevity: 92 mo
Battery Voltage: 3 V
Brady Statistic RV Percent Paced: 0.01 %
Date Time Interrogation Session: 20240718012404
HighPow Impedance: 81 Ohm
Implantable Lead Connection Status: 753985
Implantable Lead Implant Date: 20191031
Implantable Lead Location: 753860
Implantable Lead Model: 6935
Implantable Pulse Generator Implant Date: 20191031
Lead Channel Impedance Value: 342 Ohm
Lead Channel Impedance Value: 418 Ohm
Lead Channel Pacing Threshold Amplitude: 0.625 V
Lead Channel Pacing Threshold Pulse Width: 0.4 ms
Lead Channel Sensing Intrinsic Amplitude: 10.75 mV
Lead Channel Sensing Intrinsic Amplitude: 10.75 mV
Lead Channel Setting Pacing Amplitude: 2 V
Lead Channel Setting Pacing Pulse Width: 0.4 ms
Lead Channel Setting Sensing Sensitivity: 0.45 mV
Zone Setting Status: 755011
Zone Setting Status: 755011

## 2022-08-11 ENCOUNTER — Other Ambulatory Visit (HOSPITAL_COMMUNITY): Payer: Self-pay

## 2022-08-20 NOTE — Progress Notes (Signed)
Remote ICD transmission.   

## 2022-08-25 ENCOUNTER — Other Ambulatory Visit (HOSPITAL_COMMUNITY): Payer: Self-pay | Admitting: Cardiology

## 2022-08-25 ENCOUNTER — Other Ambulatory Visit (HOSPITAL_COMMUNITY): Payer: Self-pay

## 2022-08-25 MED ORDER — EZETIMIBE 10 MG PO TABS
10.0000 mg | ORAL_TABLET | Freq: Every day | ORAL | 11 refills | Status: DC
  Filled 2022-08-25: qty 30, 30d supply, fill #0
  Filled 2022-09-22: qty 30, 30d supply, fill #1
  Filled 2022-10-26: qty 30, 30d supply, fill #2
  Filled 2022-11-30: qty 30, 30d supply, fill #3
  Filled 2022-12-29: qty 30, 30d supply, fill #4
  Filled 2023-02-03: qty 30, 30d supply, fill #5
  Filled 2023-03-08: qty 30, 30d supply, fill #6
  Filled 2023-03-10: qty 90, 90d supply, fill #6
  Filled 2023-05-31: qty 90, 90d supply, fill #7

## 2022-08-28 ENCOUNTER — Ambulatory Visit: Payer: Medicare Other | Attending: Cardiology | Admitting: Cardiology

## 2022-08-28 ENCOUNTER — Encounter: Payer: Self-pay | Admitting: Cardiology

## 2022-08-28 VITALS — BP 110/66 | HR 81 | Ht 71.0 in | Wt 269.0 lb

## 2022-08-28 DIAGNOSIS — G4733 Obstructive sleep apnea (adult) (pediatric): Secondary | ICD-10-CM | POA: Insufficient documentation

## 2022-08-28 NOTE — Telephone Encounter (Signed)
DME has changed to [facility]  Upon patient request DME selection is [facility] Patient understands he will be contacted by [facility] to set up his cpap. Patient understands to call if [facility] does not contact him with new setup in a timely manner. Patient understands they will be called once confirmation has been received from [facility] that they have received their new machine to schedule 10 week follow up appointment.   [facility] notified of new cpap order  Please add to airview Patient was grateful for the call and thanked me.

## 2022-08-28 NOTE — Patient Instructions (Signed)
Medication Instructions:  Your physician recommends that you continue on your current medications as directed. Please refer to the Current Medication list given to you today.  *If you need a refill on your cardiac medications before your next appointment, please call your pharmacy*   Lab Work: None.  If you have labs (blood work) drawn today and your tests are completely normal, you will receive your results only by: MyChart Message (if you have MyChart) OR A paper copy in the mail If you have any lab test that is abnormal or we need to change your treatment, we will call you to review the results.   Testing/Procedures: None.   Follow-Up: At [facility], you and your health needs are our priority.  As part of our continuing mission to provide you with exceptional heart care, we have created designated Provider Care Teams.  These Care Teams include your primary Cardiologist (physician) and Advanced Practice Providers (APPs -  Physician Assistants and Nurse Practitioners) who all work together to provide you with the care you need, when you need it.  We recommend signing up for the patient portal called "MyChart".  Sign up information is provided on this After Visit Summary.  MyChart is used to connect with patients for Virtual Visits (Telemedicine).  Patients are able to view lab/test results, encounter notes, upcoming appointments, etc.  Non-urgent messages can be sent to your provider as well.   To learn more about what you can do with MyChart, go to [email or web address].    Your next appointment:   1 year(s)  Provider:   Dr. Armanda Magic, MD   Other Instructions Dr. Mayford Knife has ordered new cpap equipment for you. Someone from our sleep team or from your DME company may contact you regarding delivery of your equipment.

## 2022-08-28 NOTE — Progress Notes (Signed)
Date:  08/28/2022   ID:  Joshua May, DOB April 11, 1975, MRN [id number] T PCP:  Arvilla Market, MD (Inactive)   [facility] Providers Cardiologist:  Rollene Rotunda, MD Electrophysiologist:  Will [name], MD     Evaluation Performed:  Follow-Up Visit  Chief Complaint:  OSA  History of Present Illness:    Joshua May is a 47 y.o. male with with a history of CAD, chronic systolic CHF, hyperlipidemia, ischemic cardiomyopathy, pulmonary embolism and mitral valve repair.   He was referred for home sleep study by Dr. Marca Ancona for CHF.  This showed severe obstructive sleep apnea with an AHI of 68.6/h and moderate central sleep apnea with an pAHIc of 19.1/h with 4.8% of the time noted to be in Cheyne-Stokes respirations.  He had nocturnal hypoxemia and spent 215 minutes with O2 saturations less than 88%.  The patient underwent CPAP titration but due to ongoing respiratory events he was changed to BiPAP and eventually an auto BiPAP with IPAP max 20 cm H2O, EPAP min 5 cm H2O and pressure support 4 cm H2O was ordered.   He is doing well with his PAP device and thinks that he has gotten used to it.  He tolerates the full face mask and feels the pressure is adequate.  Since going on PAP he feels rested in the am and has no significant daytime sleepiness.  He denies any significant mouth or nasal dryness or nasal congestion.  He does not think that he snores.    Past Medical History:  Diagnosis Date   Asthma    CAD (coronary artery disease)    Chronic systolic CHF (congestive heart failure) (HCC)    HLD (hyperlipidemia)    Qualifier: Diagnosis of  By: Antoine Poche, MD, Gerrit Heck     Hypercholesteremia    Inferior myocardial infarction [facility]) 2007   PCI of RCA   Inferior myocardial infarction (HCC) 03/31/2008   PCI of RCA with bare metal stent   Ischemic cardiomyopathy    Migraines    and dizziness   Mitral regurgitation    Obstructive sleep apnea    Qualifier: Diagnosis  of  By: Antoine Poche, MD, FACC, James     Posterolateral myocardial infarction (HCC) 10/20/2015   PCI of LCx using DES   Pulmonary embolism (HCC) 02/28/2017   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   Carpentier-McCarthy Adams ring annuloplasty,   size 26     Tobacco abuse    Past Surgical History:  Procedure Laterality Date   CARDIAC CATHETERIZATION N/A 10/20/2015   Procedure: Left Heart Cath and Coronary Angiography;  Surgeon: Kathleene Hazel, MD;  Location: [facility] INVASIVE CV LAB;  Service: Cardiovascular;  Laterality: N/A;   CARDIAC CATHETERIZATION N/A 10/20/2015   Procedure: Coronary Stent Intervention;  Surgeon: Kathleene Hazel, MD;  Location: [facility] INVASIVE CV LAB;  Service: Cardiovascular;  Laterality: N/A;   CORONARY PRESSURE/FFR STUDY N/A 03/03/2017   Procedure: INTRAVASCULAR PRESSURE WIRE/FFR STUDY;  Surgeon: Marykay Lex, MD;  Location: [facility] INVASIVE CV LAB;  Service: Cardiovascular;  Laterality: N/A;   ICD IMPLANT N/A 11/18/2017   Procedure: ICD IMPLANT;  Surgeon: Regan Lemming, MD;  Location: [facility] INVASIVE CV LAB;  Service: Cardiovascular;  Laterality: N/A;   LEFT HEART CATH AND CORONARY ANGIOGRAPHY N/A 03/03/2017   Procedure: LEFT HEART CATH AND CORONARY ANGIOGRAPHY;  Surgeon: Marykay Lex, MD;  Location: [facility] INVASIVE CV LAB;  Service: Cardiovascular;  Laterality: N/A;   MITRAL VALVE REPAIR Right 07/07/2017  Procedure: MINIMALLY INVASIVE MITRAL VALVE REPAIR (MVR) using Carpentier-McCarthy Adams Ring size 26;  Surgeon: Purcell Nails, MD;  Location: [facility] OR;  Service: Open Heart Surgery;  Laterality: Right;   MULTIPLE EXTRACTIONS WITH ALVEOLOPLASTY N/A 05/13/2017   Procedure: Extraction of tooth #'s [id number] with alveoloplasty and gross debridement of remaining teeth.;  Surgeon: Charlynne Pander, DDS;  Location: [facility] OR;  Service: Oral Surgery;  Laterality: N/A;   none     OTHER SURGICAL HISTORY     NONE   PATENT FORAMEN OVALE(PFO) CLOSURE N/A 07/07/2017   Procedure:  PATENT FORAMEN OVALE (PFO) CLOSURE;  Surgeon: Purcell Nails, MD;  Location: [facility] OR;  Service: Open Heart Surgery;  Laterality: N/A;   RIGHT HEART CATH N/A 09/22/2017   Procedure: RIGHT HEART CATH;  Surgeon: Laurey Morale, MD;  Location: [facility] INVASIVE CV LAB;  Service: Cardiovascular;  Laterality: N/A;   TEE WITHOUT CARDIOVERSION N/A 04/19/2017   Procedure: TRANSESOPHAGEAL ECHOCARDIOGRAM (TEE);  Surgeon: Laurey Morale, MD;  Location: [facility] ENDOSCOPY;  Service: Cardiovascular;  Laterality: N/A;   TEE WITHOUT CARDIOVERSION N/A 07/07/2017   Procedure: TRANSESOPHAGEAL ECHOCARDIOGRAM (TEE);  Surgeon: Purcell Nails, MD;  Location: [facility] OR;  Service: Open Heart Surgery;  Laterality: N/A;     Current Meds  Medication Sig   albuterol (VENTOLIN HFA) 108 (90 Base) MCG/ACT inhaler Inhale 2 puffs into the lungs every 6 (six) hours as needed for wheezing or shortness of breath. Use this with a spacer to get Bevans effect when needed.   apixaban (ELIQUIS) 5 MG TABS tablet Take 1 tablet (5 mg total) by mouth 2 (two) times daily.   atorvastatin (LIPITOR) 80 MG tablet Take 1 tablet (80 mg total) by mouth daily.   Budeson-Glycopyrrol-Formoterol (BREZTRI AEROSPHERE) 160-9-4.8 MCG/ACT AERO Inhale 2 puffs into the lungs in the morning and at bedtime.   carvedilol (COREG) 12.5 MG tablet Take 1 tablet (12.5 mg total) by mouth 2 (two) times daily.   cyclobenzaprine (FLEXERIL) 5 MG tablet Take 1 tablet (5 mg total) by mouth 3 (three) times daily as needed for muscle spasms.   empagliflozin (JARDIANCE) 10 MG TABS tablet Take 1 tablet (10 mg total) by mouth daily before breakfast.   ezetimibe (ZETIA) 10 MG tablet Take 1 tablet (10 mg total) by mouth daily.   fenofibrate (TRICOR) 145 MG tablet Take 1 tablet (145 mg total) by mouth daily.   ferrous sulfate 325 (65 FE) MG tablet Take 325 mg by mouth daily.   folic acid (FOLVITE) 1 MG tablet Take 1 mg by mouth daily.   furosemide (LASIX) 40 MG tablet Take 1 tablet (40 mg total)  by mouth as directed every Monday, Wednesday, and Friday   potassium chloride SA (KLOR-CON M) 20 MEQ tablet Take 1 tablet (20 mEq total) by mouth daily.   sacubitril-valsartan (ENTRESTO) 97-103 MG Take 1 tablet by mouth 2 (two) times daily.   Spacer/Aero-Holding Chambers (AEROCHAMBER PLUS WITH MASK) inhaler use as directed   spironolactone (ALDACTONE) 25 MG tablet Take 1 tablet (25 mg total) by mouth in the morning.     Allergies:   Shellfish allergy   Social History   Tobacco Use   Smoking status: Former    Current packs/day: 0.50    Average packs/day: 0.5 packs/day for 17.0 years (8.5 ttl pk-yrs)    Types: Cigarettes   Smokeless tobacco: Never   Tobacco comments:    quit since heart attack in 10/2015  Vaping Use   Vaping status: Never Used  Substance Use Topics   Alcohol use: Not Currently   Drug use: Not Currently    Types: Marijuana     Family Hx: The patient's family history includes Deep vein thrombosis in his father; Heart attack in his father; Heart attack (age of onset: 52) in his sister.  ROS:   Please see the history of present illness.     All other systems reviewed and are negative.   Prior CV studies:   The following studies were reviewed today:  Home sleep study, CPAP titration, PAP download  Labs/Other Tests and Data Reviewed:    EKG:  No ECG reviewed.  Recent Labs: 12/16/2021: B Natriuretic Peptide 40.0 04/06/2022: BUN 17; Creatinine, Ser 1.11; Potassium 4.0; Sodium 138   Recent Lipid Panel Lab Results  Component Value Date/Time   CHOL 99 07/21/2021 12:12 PM   CHOL 136 04/24/2020 11:51 AM   TRIG 97 07/21/2021 12:12 PM   HDL 21 (L) 07/21/2021 12:12 PM   HDL 25 (L) 04/24/2020 11:51 AM   CHOLHDL 4.7 07/21/2021 12:12 PM   LDLCALC 59 07/21/2021 12:12 PM   LDLCALC 89 04/24/2020 11:51 AM   LDLDIRECT 149.8 11/15/2009 09:33 AM    Wt Readings from Last 3 Encounters:  08/28/22 269 lb (122 kg)  03/18/22 284 lb 9.6 oz (129.1 kg)  02/24/22 286 lb  12.8 oz (130.1 kg)     Risk Assessment/Calculations:          Objective:    Vital Signs:  BP 110/66   Pulse 81   Ht 5\' 11"  (1.803 m)   Wt 269 lb (122 kg)   SpO2 99%   BMI 37.52 kg/m   GEN: Well nourished, well developed in no acute distress HEENT: Normal NECK: No JVD; No carotid bruits LYMPHATICS: No lymphadenopathy CARDIAC:RRR, no murmurs, rubs, gallops RESPIRATORY:  Clear to auscultation without rales, wheezing or rhonchi  ABDOMEN: Soft, non-tender, non-distended MUSCULOSKELETAL:  No edema; No deformity  SKIN: Warm and dry NEUROLOGIC:  Alert and oriented x 3 PSYCHIATRIC:  Normal affect  ASSESSMENT & PLAN:    OSA - The patient is tolerating PAP therapy well without any problems. The PAP download performed by his DME was personally reviewed and interpreted by me today and showed an AHI of 3.5/hr on auto BiPAP cm H2O with 70% compliance in using more than 4 hours nightly.  The patient has been using and benefiting from PAP use and will continue to benefit from therapy.       Medication Adjustments/Labs and Tests Ordered: Current medicines are reviewed at length with the patient today.  Concerns regarding medicines are outlined above.   Tests Ordered: No orders of the defined types were placed in this encounter.   Medication Changes: No orders of the defined types were placed in this encounter.   Follow Up:  Followup with me in 1 year  Signed, Armanda Magic, MD  08/28/2022 2:50 PM    [facility]

## 2022-08-31 ENCOUNTER — Telehealth: Payer: Self-pay | Admitting: *Deleted

## 2022-08-31 ENCOUNTER — Ambulatory Visit (HOSPITAL_COMMUNITY)
Admission: RE | Admit: 2022-08-31 | Discharge: 2022-08-31 | Disposition: A | Discharge status: Home / Self Care | Payer: Medicare Other | Source: Ambulatory Visit | Attending: Cardiology | Admitting: Cardiology

## 2022-08-31 ENCOUNTER — Other Ambulatory Visit (HOSPITAL_COMMUNITY): Payer: Self-pay

## 2022-08-31 VITALS — BP 126/64 | HR 77 | Wt 274.0 lb

## 2022-08-31 DIAGNOSIS — I2510 Atherosclerotic heart disease of native coronary artery without angina pectoris: Secondary | ICD-10-CM | POA: Insufficient documentation

## 2022-08-31 DIAGNOSIS — J449 Chronic obstructive pulmonary disease, unspecified: Secondary | ICD-10-CM | POA: Diagnosis not present

## 2022-08-31 DIAGNOSIS — I5022 Chronic systolic (congestive) heart failure: Secondary | ICD-10-CM | POA: Diagnosis not present

## 2022-08-31 DIAGNOSIS — Z86711 Personal history of pulmonary embolism: Secondary | ICD-10-CM | POA: Diagnosis not present

## 2022-08-31 DIAGNOSIS — I255 Ischemic cardiomyopathy: Secondary | ICD-10-CM | POA: Diagnosis not present

## 2022-08-31 DIAGNOSIS — Z79899 Other long term (current) drug therapy: Secondary | ICD-10-CM | POA: Insufficient documentation

## 2022-08-31 DIAGNOSIS — Z7984 Long term (current) use of oral hypoglycemic drugs: Secondary | ICD-10-CM | POA: Insufficient documentation

## 2022-08-31 DIAGNOSIS — Z6838 Body mass index (BMI) 38.0-38.9, adult: Secondary | ICD-10-CM | POA: Insufficient documentation

## 2022-08-31 DIAGNOSIS — Z7901 Long term (current) use of anticoagulants: Secondary | ICD-10-CM | POA: Insufficient documentation

## 2022-08-31 DIAGNOSIS — I252 Old myocardial infarction: Secondary | ICD-10-CM | POA: Insufficient documentation

## 2022-08-31 DIAGNOSIS — Z87891 Personal history of nicotine dependence: Secondary | ICD-10-CM | POA: Insufficient documentation

## 2022-08-31 DIAGNOSIS — J4489 Other specified chronic obstructive pulmonary disease: Secondary | ICD-10-CM | POA: Diagnosis not present

## 2022-08-31 DIAGNOSIS — J439 Emphysema, unspecified: Secondary | ICD-10-CM | POA: Insufficient documentation

## 2022-08-31 DIAGNOSIS — Z955 Presence of coronary angioplasty implant and graft: Secondary | ICD-10-CM | POA: Diagnosis not present

## 2022-08-31 DIAGNOSIS — E782 Mixed hyperlipidemia: Secondary | ICD-10-CM | POA: Insufficient documentation

## 2022-08-31 DIAGNOSIS — G4733 Obstructive sleep apnea (adult) (pediatric): Secondary | ICD-10-CM

## 2022-08-31 DIAGNOSIS — E669 Obesity, unspecified: Secondary | ICD-10-CM | POA: Diagnosis not present

## 2022-08-31 DIAGNOSIS — R0789 Other chest pain: Secondary | ICD-10-CM | POA: Diagnosis not present

## 2022-08-31 DIAGNOSIS — Z95810 Presence of automatic (implantable) cardiac defibrillator: Secondary | ICD-10-CM | POA: Insufficient documentation

## 2022-08-31 LAB — LIPID PANEL
Cholesterol: 92 mg/dL (ref 0–200)
HDL: 26 mg/dL — ABNORMAL LOW (ref >40)
LDL Cholesterol: 43 mg/dL (ref 0–99)
Total CHOL/HDL Ratio: 3.5 RATIO
Triglycerides: 116 mg/dL (ref <150)
VLDL: 23 mg/dL (ref 0–40)

## 2022-08-31 LAB — BASIC METABOLIC PANEL
Anion gap: 8 (ref 5–15)
BUN: 20 mg/dL (ref 6–20)
CO2: 23 mmol/L (ref 22–32)
Calcium: 8.8 mg/dL — ABNORMAL LOW (ref 8.9–10.3)
Chloride: 105 mmol/L (ref 98–111)
Creatinine, Ser: 1.17 mg/dL (ref 0.61–1.24)
GFR, Estimated: >60 mL/min (ref >60)
Glucose, Bld: 122 mg/dL — ABNORMAL HIGH (ref 70–99)
Potassium: 3.9 mmol/L (ref 3.5–5.1)
Sodium: 136 mmol/L (ref 135–145)

## 2022-08-31 LAB — BRAIN NATRIURETIC PEPTIDE: B Natriuretic Peptide: 28.1 pg/mL (ref 0.0–100.0)

## 2022-08-31 MED ORDER — CARVEDILOL 12.5 MG PO TABS
18.7500 mg | ORAL_TABLET | Freq: Two times a day (BID) | ORAL | 3 refills | Status: DC
  Filled 2022-08-31: qty 90, 30d supply, fill #0
  Filled 2022-10-14: qty 90, 30d supply, fill #1
  Filled 2022-11-09: qty 90, 30d supply, fill #2
  Filled 2022-12-20: qty 90, 30d supply, fill #3

## 2022-08-31 NOTE — Patient Instructions (Signed)
Medication Changes:  Increase Carvedilol to 18.75 mg (1 & 1/2 tabs) Twice daily   Lab Work:  Labs done today, your results will be available in MyChart, we will contact you for abnormal readings.  Testing/Procedures:  Your physician has requested that you have an echocardiogram. Echocardiography is a painless test that uses sound waves to create images of your heart. It provides your doctor with information about the size and shape of your heart and how well your heart's chambers and valves are working. This procedure takes approximately one hour. There are no restrictions for this procedure. Please do NOT wear cologne, perfume, aftershave, or lotions (deodorant is allowed). Please arrive 15 minutes prior to your appointment time. [place]  Referrals:  none  Special Instructions // Education:  Do the following things EVERYDAY: Weigh yourself in the morning before breakfast. Write it down and keep it in a log. Take your medicines as prescribed Eat low salt foods--Limit salt (sodium) to 2000 mg per day.  Stay as active as you can everyday Limit all fluids for the day to less than 2 liters   Follow-Up in: 4 months  At the [facility], you and your health needs are our priority. We have a designated team specialized in the treatment of Heart Failure. This Care Team includes your primary Heart Failure Specialized Cardiologist (physician), Advanced Practice Providers (APPs- Physician Assistants and Nurse Practitioners), and Pharmacist who all work together to provide you with the care you need, when you need it.   You may see any of the following providers on your designated Care Team at your next follow up:  Dr. Arvilla Meres Dr. Marca Ancona Dr. Marcos Eke, NP Robbie Lis, [place] [facility] [place], [place] Brynda Peon, NP Karle Plumber, PharmD   Please be sure to bring in all your medications bottles to every appointment.    Need to Contact us:  If you have any questions or concerns before your next appointment please send [place] a message through [place] or call our office at [phone].    TO LEAVE A MESSAGE FOR THE NURSE SELECT OPTION 2, PLEASE LEAVE A MESSAGE INCLUDING: YOUR NAME DATE OF BIRTH CALL BACK NUMBER REASON FOR CALL**this is important as we prioritize the call backs  YOU WILL RECEIVE A CALL BACK THE SAME DAY AS LONG AS YOU CALL BEFORE 4:00 PM

## 2022-08-31 NOTE — Telephone Encounter (Signed)
-----   Message from Nurse Alcario Drought E sent at 08/28/2022  3:06 PM EDT ----- Regarding: Cpap supplies Dr. Mayford Knife would like to order new cpap supplies for this patient.  (This is the fellow who needs the phone # for his DME company so he can choose a new mask.) Alcario Drought

## 2022-08-31 NOTE — Telephone Encounter (Signed)
Supplies ordered via community message for supplies through [employer].

## 2022-09-01 NOTE — Progress Notes (Signed)
PCP: Arvilla Market, MD (Inactive) HF Cardiology: Dr Shirlee Latch   HPI: Joshua May is a 47 y.o. male AA male with a history of CAD dating back to 2007 when he had a PCI in [place]. In 2010 he presented with a STEMI and had an RCA DES placed. He followed up for a year but then didn't present again till Oct 2017 when he presented with a CFX infarct. Cath revealed 50% ISR of the RCA, total mCFX and 85% OM3. He had CFX PCI with DES and OM3 POBA. His EF then was 50-55%.  Admitted 02/27/17 with increased dyspnea and chest pain. CTA confirmed PE. ECHO completed and showed reduced EF 20-25%. Underwent LHC as noted below. [place] no evidence DVTs. CT surgery consulted for severe MR. Plan to optimize HF medications.Set up TEE as an outpatient. Discharge weight 222 pounds.   Admitted 06/2017 with scheduled MV repair. S/P Minimally invasive mitral valve repair on 6/19. ECHO 07/12/17 post surgery, no MR was noted but EF remained 25-30%. Tachycardic at the time of discharge. Discharge weight 257 pounds.   CPX was submaximal in 8/19, suggestive of marked deconditioning.  RHC in 9/19 showed relatively preserved cardiac index with only mildly elevated filling pressures.  Echo in 9/19 showed EF 30-35%. MDT ICD placed in 2019.   Echo in 8/21 showed EF 45%, RV normal, s/p MV repair with mean gradient 5 and MVA 1.78 cm^2 (mild mitral stenosis), trivial MR.   Echo in 11/22 showed EF 45-50%, s/p MV repair with no MR, moderate mitral stenosis with mean gradient 7 mmHg and MVA 1.12 by VTI, IVC normal, RV normal.   Echo 11/23 EF 45% with global hypokinesis, mild RV dysfunction, s/p MV repair with no MR and mild-moderate MS mean gradient 5 mmHg, IVC normal.   Follow up 11/23 with NYHA III symptoms and stable volume. Having atypical chest pain vs bronchospasm, and cardiac PET arranged to rule out ischemia.  Cardiac PET (1/24) shows EF 47%, old inferolateral MI, small area of ischemia at the apex. Overall low risk with minimal  ischemia and no indication for cath.  He returns today for followup of CHF and CAD.  Weight is down 10 lbs.  Rare atypical chest pain.  Bipap has helped with fatigue and sleepiness.  No change in breathing, still winded with heavy exertion and walking at a brisk pace up stairs, does ok with normal activity.  No orthopnea/PND.  No lightheadedness.    MDT device interrogation: Stable thoracic impedance, no AF/VT.   ECG (personally reviewed): NSR, old inferior MI, old anterolateral MI, inferolateral TWIs  Labs (7/19): K 4.5, creatinine 0.95, LDL 51 Labs (8/19): K 3.8, creatinine 0.99 Labs (5/21): K 4.8, creatinine 1.08, LDL 34, TGs 381 Labs (10/21): LDL 78, HDL 74, TGs 178 Labs (12/21): K 4.2, creatinine 1.3 Labs (3/22): K 3.7, creatinine 1.12 Labs (4/22): LDL 89 Labs (10/22): AST 36, ALT 52, LDL 74, TGs 107 Labs (11/22): K 4, creatinine 1.03 Labs (7/23): LDL 59, BNP 57, K 4.2, creatinine 0.92 Labs (11/23): K 4.1, creatinine 1.19 Labs (3/24): K 4, creatinine 1.11  PMH: 1. CAD: Initial PCI in 2007 in [place], inferior MI.   - Inferior STEMI 2010 with RCA PCI.  - CFx infarct in 2017 with LCx DES and OM3 POBA.  - LHC (2/19): Nonobstructive CAD.  - Cardiac PET (1/24): EF 47%, old inferolateral MI, small area of ischemia at apex, overall low risk with minimal ischemia. 2. 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  -  TEE (4/19): LVEF 35-40%, Severe MR with PISA ERO 0.43 cm^2. Suspect infarct related MR with tethering of the posterior leaflet.  - Minimally invasive MV repair in 6/19.   3. Chronic systolic CHF: Ischemic cardiomyopathy. Medtronic ICD.  - Echo (2/19): EF 25-30%, diffuse HK with inferolateral AK, mildly decreased RV systolic function with D-shaped septum, moderate-severe infarct-related MR.  - Echo (6/19): EF 25-30%, apical and inferolateral hypokinesis, MV repair with mean gradient 9 mmHg with no MR, mildly decreased RV systolic function.  - CPX (8/19): VO2 13.2, VE/VCO2 slope 28, RER  0.87.  This study was suggestive of severe deconditioning.  - RHC (9/19): mean RA 9, PA 39/23 mean 24, mean PCWP 17, CI 2.2, PVR 1.35 WU - Echo (9/19): EF 30-35%, diffuse hypokinesis, normal RV, s/p MV repair with mean gradient 6 mmHg and PHT 104 msec => probably no significant stenosis.  - Echo (8/21): EF 45%, RV normal, s/p MV repair with mean gradient 5 and MVA 1.78 cm^2 (mild mitral stenosis), trivial MR.  - Echo (11/22): EF 45-50%, s/p MV repair with no MR, moderate mitral stenosis with mean gradient 7 mmHg and MVA 1.12 by VTI, IVC normal, RV normal.  - Echo (11/23): EF 45% with global hypokinesis, mild RV dysfunction, s/p MV repair with no MR and mild-moderate MS mean gradient 5 mmHg, IVC normal.  4. Hyperlipidemia 5. PE: 2/19.  6. COPD: Moderate COPD on 6/19 PFTs.  7. OSA: Uses Bipap  Review of systems complete and found to be negative unless listed in HPI.    Social History   Socioeconomic History   Marital status: Married    Spouse name: Not on file   Number of children: Not on file   Years of education: Not on file   Highest education level: Not on file  Occupational History   Not on file  Tobacco Use   Smoking status: Former    Current packs/day: 0.50    Average packs/day: 0.5 packs/day for 17.0 years (8.5 ttl pk-yrs)    Types: Cigarettes   Smokeless tobacco: Never   Tobacco comments:    quit since heart attack in 10/2015  Vaping Use   Vaping status: Never Used  Substance and Sexual Activity   Alcohol use: Not Currently   Drug use: Not Currently    Types: Marijuana   Sexual activity: Not on file  Other Topics Concern   Not on file  Social History Narrative   The patient is married with two children.   Social Determinants of Health   Financial Resource Strain: Not on file  Food Insecurity: Not on file  Transportation Needs: Not on file  Physical Activity: Not on file  Stress: Not on file  Social Connections: Not on file  Intimate Partner Violence: Not on  file   Family History  Problem Relation Age of Onset   Heart attack Sister 67       S/P CABG   Heart attack Father    Deep vein thrombosis Father     Current Outpatient Medications  Medication Sig Dispense Refill   albuterol (VENTOLIN HFA) 108 (90 Base) MCG/ACT inhaler Inhale 2 puffs into the lungs every 6 (six) hours as needed for wheezing or shortness of breath. Use this with a spacer to get Fulop effect when needed. 18 g 3   apixaban (ELIQUIS) 5 MG TABS tablet Take 1 tablet (5 mg total) by mouth 2 (two) times daily. 180 tablet 3   atorvastatin (LIPITOR) 80 MG tablet Take  1 tablet (80 mg total) by mouth daily. 90 tablet 3   Budeson-Glycopyrrol-Formoterol (BREZTRI AEROSPHERE) 160-9-4.8 MCG/ACT AERO Inhale 2 puffs into the lungs in the morning and at bedtime. 10.7 g 4   cyclobenzaprine (FLEXERIL) 5 MG tablet Take 1 tablet (5 mg total) by mouth 3 (three) times daily as needed for muscle spasms. 14 tablet 0   empagliflozin (JARDIANCE) 10 MG TABS tablet Take 1 tablet (10 mg total) by mouth daily before breakfast. 90 tablet 1   ezetimibe (ZETIA) 10 MG tablet Take 1 tablet (10 mg total) by mouth daily. 30 tablet 11   fenofibrate (TRICOR) 145 MG tablet Take 1 tablet (145 mg total) by mouth daily. 90 tablet 3   ferrous sulfate 325 (65 FE) MG tablet Take 325 mg by mouth daily.     folic acid (FOLVITE) 1 MG tablet Take 1 mg by mouth daily.     furosemide (LASIX) 40 MG tablet Take 1 tablet (40 mg total) by mouth as directed every Monday, Wednesday, and Friday 60 tablet 3   potassium chloride SA (KLOR-CON M) 20 MEQ tablet Take 1 tablet (20 mEq total) by mouth daily. 60 tablet 11   sacubitril-valsartan (ENTRESTO) 97-103 MG Take 1 tablet by mouth 2 (two) times daily. 180 tablet 3   Spacer/Aero-Holding Chambers (AEROCHAMBER PLUS WITH MASK) inhaler use as directed 1 each 0   spironolactone (ALDACTONE) 25 MG tablet Take 1 tablet (25 mg total) by mouth in the morning. 90 tablet 2   carvedilol (COREG) 12.5  MG tablet Take 1.5 tablets (18.75 mg total) by mouth 2 (two) times daily. 90 tablet 3   No current facility-administered medications for this encounter.   BP 126/64   Pulse 77   Wt 124.3 kg (274 lb)   SpO2 97%   BMI 38.22 kg/m   Wt Readings from Last 3 Encounters:  08/31/22 124.3 kg (274 lb)  08/28/22 122 kg (269 lb)  03/18/22 129.1 kg (284 lb 9.6 oz)    PHYSICAL EXAM: General: NAD Neck: Thick. No JVD, no thyromegaly or thyroid nodule.  Lungs: Clear to auscultation bilaterally with normal respiratory effort. CV: Nondisplaced PMI.  Heart regular S1/S2, no S3/S4, no murmur.  No peripheral edema.  No carotid bruit.  Normal pedal pulses.  Abdomen: Soft, nontender, no hepatosplenomegaly, no distention.  Skin: Intact without lesions or rashes.  Neurologic: Alert and oriented x 3.  Psych: Normal affect. Extremities: No clubbing or cyanosis.  HEENT: Normal.   ASSESSMENT & PLAN: 1. Chronic systolic CHF: Ischemic cardiomyopathy. Echo in 6/19 with EF 25-30%, echo 9/19 with EF 30-35%.  Medtronic ICD.  Echo in 8/21 showed EF up to 45%.  Echo in 11/22 showed EF 45-50%. Echo in 11/23 showed  EF 45% with global hypokinesis, mild RV dysfunction, s/p MV repair with no MR and mild-moderate MS mean gradient 5 mmHg, IVC normal.  No volume overload by exam or by Optivol. NYHA class II, COPD also plays a role in dyspnea.  - Continue spironolactone 25 mg daily.   - Increase Coreg to 18.75 mg bid.  - Continue Entresto 97/103 bid.   - Continue Lasix (takes 3x/week). BMET/BNP today. - Continue empagliflozin 10 mg daily.  - Echo in 11/24.  2. CAD: Stents in LCx and RCA. LHC 02/2017 showed nonobstructive disease. Cardiac PET in 1/24 showed EF 47%, minimal ischemia, low risk. Rare atypical chest pain. - Continue atorvastatin 80 mg daily, check lipids today.    - He is on apixaban, so no ASA.  3. PE: In 2019, no definite trigger. Father with h/o VTE.  He should have long-term anticoagulation.   - Continue  Eliquis. No bleeding issues. 4. Mitral regurgitation: Ischemic MR.  S/p minimally invasive MV repair in 6/19. Post-op echo 6/19 showed no MR but mean gradient 9 mmHg across MV. Echo (9/19) with mean MV gradient 6 mmHg but PHT short at 104 msec, probably no significant stenosis.  Echo in 8/21 showed mean gradient 5 mmHg across repaired MV with MVA 1.78 cm^2.  Echo in 11/22 showed mean gradient 7 mmHg across MV with MVA 1.12 cm^2, possible moderate mitral stenosis.  Only trivial MR.  Echo in 11/23 with no MR, mild-moderate MS with mean gradient 5 mmHg.  5. 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 Contributory to his dyspnea.  - He is followed by Pulmonary. - Needs weight loss and exercise 6. Hyperlipidemia:  - Continue atorvastatin 80 mg daily.  - Continue Zetia 10 mg daily.   - Check lipids today.  7. OSA: Using Bipap.   8. Obesity: Body mass index is 38.22 kg/m. - Eventual referral to PharmD for semaglutide/tirzepatide. He is not diabetic, so insurance coverage is currently a barrier.  Follow up in 4 months APP  Marca Ancona  09/01/2022

## 2022-09-11 ENCOUNTER — Other Ambulatory Visit (HOSPITAL_COMMUNITY): Payer: Self-pay

## 2022-09-22 ENCOUNTER — Ambulatory Visit: Payer: Medicare Other | Attending: Cardiology | Admitting: Cardiology

## 2022-09-22 ENCOUNTER — Other Ambulatory Visit (HOSPITAL_COMMUNITY): Payer: Self-pay | Admitting: Cardiology

## 2022-09-22 ENCOUNTER — Other Ambulatory Visit (HOSPITAL_COMMUNITY): Payer: Self-pay

## 2022-09-23 ENCOUNTER — Other Ambulatory Visit: Payer: Self-pay

## 2022-09-24 ENCOUNTER — Other Ambulatory Visit (HOSPITAL_COMMUNITY): Payer: Self-pay

## 2022-09-24 ENCOUNTER — Encounter: Payer: Self-pay | Admitting: Cardiology

## 2022-09-24 MED ORDER — EMPAGLIFLOZIN 10 MG PO TABS
10.0000 mg | ORAL_TABLET | Freq: Every day | ORAL | 3 refills | Status: DC
  Filled 2022-09-24: qty 90, 90d supply, fill #0
  Filled 2022-12-29: qty 90, 90d supply, fill #1
  Filled 2023-04-01: qty 90, 90d supply, fill #2
  Filled 2023-07-09: qty 90, 90d supply, fill #3

## 2022-10-14 ENCOUNTER — Other Ambulatory Visit (HOSPITAL_COMMUNITY): Payer: Self-pay | Admitting: Cardiology

## 2022-10-15 ENCOUNTER — Other Ambulatory Visit (HOSPITAL_COMMUNITY): Payer: Self-pay

## 2022-10-15 MED ORDER — SPIRONOLACTONE 25 MG PO TABS
25.0000 mg | ORAL_TABLET | Freq: Every morning | ORAL | 2 refills | Status: DC
  Filled 2022-10-15: qty 90, 90d supply, fill #0
  Filled 2023-01-15: qty 90, 90d supply, fill #1
  Filled 2023-04-17: qty 90, 90d supply, fill #2

## 2022-11-05 ENCOUNTER — Ambulatory Visit (INDEPENDENT_AMBULATORY_CARE_PROVIDER_SITE_OTHER): Payer: Medicare Other

## 2022-11-05 DIAGNOSIS — I255 Ischemic cardiomyopathy: Secondary | ICD-10-CM

## 2022-11-05 DIAGNOSIS — I5022 Chronic systolic (congestive) heart failure: Secondary | ICD-10-CM

## 2022-11-05 LAB — CUP PACEART REMOTE DEVICE CHECK
Battery Remaining Longevity: 89 mo
Battery Voltage: 2.99 V
Brady Statistic RV Percent Paced: 0.01 %
Date Time Interrogation Session: 20241017073825
HighPow Impedance: 71 Ohm
Implantable Lead Connection Status: 753985
Implantable Lead Implant Date: 20191031
Implantable Lead Location: 753860
Implantable Lead Model: 6935
Implantable Pulse Generator Implant Date: 20191031
Lead Channel Impedance Value: 304 Ohm
Lead Channel Impedance Value: 399 Ohm
Lead Channel Pacing Threshold Amplitude: 0.625 V
Lead Channel Pacing Threshold Pulse Width: 0.4 ms
Lead Channel Sensing Intrinsic Amplitude: 11.625 mV
Lead Channel Sensing Intrinsic Amplitude: 11.625 mV
Lead Channel Setting Pacing Amplitude: 2 V
Lead Channel Setting Pacing Pulse Width: 0.4 ms
Lead Channel Setting Sensing Sensitivity: 0.45 mV
Zone Setting Status: 755011
Zone Setting Status: 755011

## 2022-11-24 NOTE — Progress Notes (Signed)
Remote ICD transmission.   

## 2022-12-01 ENCOUNTER — Ambulatory Visit (HOSPITAL_COMMUNITY)
Admission: RE | Admit: 2022-12-01 | Discharge: 2022-12-01 | Disposition: A | Discharge status: Home / Self Care | Payer: Medicare Other | Source: Ambulatory Visit | Attending: Cardiology | Admitting: Cardiology

## 2022-12-01 DIAGNOSIS — E785 Hyperlipidemia, unspecified: Secondary | ICD-10-CM | POA: Insufficient documentation

## 2022-12-01 DIAGNOSIS — I2510 Atherosclerotic heart disease of native coronary artery without angina pectoris: Secondary | ICD-10-CM | POA: Diagnosis not present

## 2022-12-01 DIAGNOSIS — I5022 Chronic systolic (congestive) heart failure: Secondary | ICD-10-CM | POA: Insufficient documentation

## 2022-12-01 DIAGNOSIS — G4730 Sleep apnea, unspecified: Secondary | ICD-10-CM | POA: Insufficient documentation

## 2022-12-01 LAB — ECHOCARDIOGRAM COMPLETE
Area-P 1/2: 2 cm2
Calc EF: 29.5 %
MV VTI: 0.98 cm2
S' Lateral: 4.7 cm
Single Plane A2C EF: 32.5 %
Single Plane A4C EF: 27.1 %

## 2022-12-01 MED ORDER — PERFLUTREN LIPID MICROSPHERE
1.0000 mL | INTRAVENOUS | Status: AC | PRN
  Administered 2022-12-01: 3 mL via INTRAVENOUS

## 2022-12-31 ENCOUNTER — Ambulatory Visit (HOSPITAL_COMMUNITY)
Admission: RE | Admit: 2022-12-31 | Discharge: 2022-12-31 | Disposition: A | Discharge status: Home / Self Care | Payer: Medicare Other | Source: Ambulatory Visit | Attending: Physician Assistant | Admitting: Physician Assistant

## 2022-12-31 ENCOUNTER — Encounter (HOSPITAL_COMMUNITY): Payer: Self-pay

## 2022-12-31 VITALS — BP 102/74 | HR 72 | Wt 274.4 lb

## 2022-12-31 DIAGNOSIS — I5022 Chronic systolic (congestive) heart failure: Secondary | ICD-10-CM | POA: Diagnosis not present

## 2022-12-31 DIAGNOSIS — E669 Obesity, unspecified: Secondary | ICD-10-CM | POA: Diagnosis not present

## 2022-12-31 DIAGNOSIS — Z7901 Long term (current) use of anticoagulants: Secondary | ICD-10-CM | POA: Diagnosis not present

## 2022-12-31 DIAGNOSIS — E785 Hyperlipidemia, unspecified: Secondary | ICD-10-CM | POA: Insufficient documentation

## 2022-12-31 DIAGNOSIS — I2510 Atherosclerotic heart disease of native coronary artery without angina pectoris: Secondary | ICD-10-CM | POA: Diagnosis present

## 2022-12-31 DIAGNOSIS — J449 Chronic obstructive pulmonary disease, unspecified: Secondary | ICD-10-CM | POA: Insufficient documentation

## 2022-12-31 DIAGNOSIS — I255 Ischemic cardiomyopathy: Secondary | ICD-10-CM

## 2022-12-31 DIAGNOSIS — Z7984 Long term (current) use of oral hypoglycemic drugs: Secondary | ICD-10-CM | POA: Insufficient documentation

## 2022-12-31 DIAGNOSIS — R42 Dizziness and giddiness: Secondary | ICD-10-CM | POA: Insufficient documentation

## 2022-12-31 DIAGNOSIS — Z955 Presence of coronary angioplasty implant and graft: Secondary | ICD-10-CM | POA: Diagnosis not present

## 2022-12-31 DIAGNOSIS — Z79899 Other long term (current) drug therapy: Secondary | ICD-10-CM | POA: Diagnosis not present

## 2022-12-31 DIAGNOSIS — R0789 Other chest pain: Secondary | ICD-10-CM | POA: Diagnosis not present

## 2022-12-31 DIAGNOSIS — I252 Old myocardial infarction: Secondary | ICD-10-CM | POA: Insufficient documentation

## 2022-12-31 DIAGNOSIS — Z6838 Body mass index (BMI) 38.0-38.9, adult: Secondary | ICD-10-CM | POA: Insufficient documentation

## 2022-12-31 DIAGNOSIS — Z95810 Presence of automatic (implantable) cardiac defibrillator: Secondary | ICD-10-CM | POA: Insufficient documentation

## 2022-12-31 DIAGNOSIS — G4733 Obstructive sleep apnea (adult) (pediatric): Secondary | ICD-10-CM

## 2022-12-31 DIAGNOSIS — I340 Nonrheumatic mitral (valve) insufficiency: Secondary | ICD-10-CM | POA: Insufficient documentation

## 2022-12-31 LAB — BRAIN NATRIURETIC PEPTIDE: B Natriuretic Peptide: 95.3 pg/mL (ref 0.0–100.0)

## 2022-12-31 NOTE — Progress Notes (Addendum)
PCP: Arvilla Market, MD (Inactive) HF Cardiology: Dr Shirlee Latch   HPI: Joshua May is a 47 y.o. male AA male with a history of CAD dating back to 2007 when he had a PCI in [place]. In 2010 he presented with a STEMI and had an RCA DES placed. He followed up for a year but then didn't present again till Oct 2017 when he presented with a CX infarct. He had CX PCI with DES and OM3 POBA. His EF then was 50-55%.  Admitted 02/27/17 with increased dyspnea and chest pain. CTA confirmed PE. ECHO showed EF 20-25%. LHC as noted below.   S/P Minimally invasive mitral valve repair on 6/19. ECHO 07/12/17 post surgery, no MR was noted but EF remained 25-30%.   CPX was submaximal in 8/19, suggestive of marked deconditioning.  RHC in 9/19 showed relatively preserved cardiac index with only mildly elevated filling pressures.  Echo in 9/19 showed EF 30-35%. MDT ICD placed in 2019.   Echo 8/21 EF 45%, RV normal, s/p MV repair with mean gradient 5 and MVA 1.78 cm^2 (mild mitral stenosis), trivial MR.   Echo in 11/22: EF 45-50%, s/p MV repair with no MR, moderate mitral stenosis with mean gradient 7 mmHg and MVA 1.12 by VTI, IVC normal, RV normal.   Echo 11/23 EF 45% with global hypokinesis, mild RV dysfunction, s/p MV repair with no MR and mild-moderate MS mean gradient 5 mmHg, IVC normal.   Cardiac PET (1/24) EF 47%, old inferolateral MI, small area of ischemia at the apex. Overall low risk with minimal ischemia and no indication for cath.  Echo 11/24: EF 35-40%, RV mildly reduced, mean gradient of 5.5 mmHg across mitral valve  Here today for 4 month CHF follow-up. He has been stable from HF standpoint. Gets some shortness of breath when walking fast, climbing up a flight of stairs or walking up an incline. When outside in colder temperatures his chest often feels tight. No chest discomfort similar to prior angina. No orthopnea, PND or lower extremity edema. Gets lightheaded if he bends over then stands up too  quickly. No presyncope or syncope. Compliant with all medications. Uses BiPAP nightly.   Quit smoking cigarettes in 2017. No ETOH use. No drug use.  MDT device interrogation: OptiVol fluid index is 0, thoracic impedance above threshold, activity level 5-6 hrs/day, no VT or AF   PMH: 1. CAD: Initial PCI in 2007 in [place], inferior MI.   - Inferior STEMI 2010 with RCA PCI.  - CFx infarct in 2017 with LCx DES and OM3 POBA.  - LHC (2/19): Nonobstructive CAD.  - Cardiac PET (1/24): EF 47%, old inferolateral MI, small area of ischemia at apex, overall low risk with minimal ischemia. 2. 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  - TEE (4/19): LVEF 35-40%, Severe MR with PISA ERO 0.43 cm^2. Suspect infarct related MR with tethering of the posterior leaflet.  - Minimally invasive MV repair in 6/19.   3. Chronic systolic CHF: Ischemic cardiomyopathy. Medtronic ICD.  - Echo (2/19): EF 25-30%, diffuse HK with inferolateral AK, mildly decreased RV systolic function with D-shaped septum, moderate-severe infarct-related MR.  - Echo (6/19): EF 25-30%, apical and inferolateral hypokinesis, MV repair with mean gradient 9 mmHg with no MR, mildly decreased RV systolic function.  - CPX (8/19): VO2 13.2, VE/VCO2 slope 28, RER 0.87.  This study was suggestive of severe deconditioning.  - RHC (9/19): mean RA 9, PA 39/23 mean 24, mean PCWP 17, CI 2.2, PVR 1.35 WU -  Echo (9/19): EF 30-35%, diffuse hypokinesis, normal RV, s/p MV repair with mean gradient 6 mmHg and PHT 104 msec => probably no significant stenosis.  - Echo (8/21): EF 45%, RV normal, s/p MV repair with mean gradient 5 and MVA 1.78 cm^2 (mild mitral stenosis), trivial MR.  - Echo (11/22): EF 45-50%, s/p MV repair with no MR, moderate mitral stenosis with mean gradient 7 mmHg and MVA 1.12 by VTI, IVC normal, RV normal.  - Echo (11/23): EF 45% with global hypokinesis, mild RV dysfunction, s/p MV repair with no MR and mild-moderate MS mean gradient 5 mmHg, IVC  normal.  - Echo 11/24: EF 35-40%, RV mildly reduced, mean gradient of 5.5 mmhg across mitral valve 4. Hyperlipidemia 5. PE: 2/19.  6. COPD: Moderate COPD on 6/19 PFTs.  7. OSA: Uses Bipap  Review of systems complete and found to be negative unless listed in HPI.    Social History   Socioeconomic History   Marital status: Married    Spouse name: Not on file   Number of children: Not on file   Years of education: Not on file   Highest education level: Not on file  Occupational History   Not on file  Tobacco Use   Smoking status: Former    Current packs/day: 0.50    Average packs/day: 0.5 packs/day for 17.0 years (8.5 ttl pk-yrs)    Types: Cigarettes   Smokeless tobacco: Never   Tobacco comments:    quit since heart attack in 10/2015  Vaping Use   Vaping status: Never Used  Substance and Sexual Activity   Alcohol use: Not Currently   Drug use: Not Currently    Types: Marijuana   Sexual activity: Not on file  Other Topics Concern   Not on file  Social History Narrative   The patient is married with two children.   Social Drivers of Corporate investment banker Strain: Not on file  Food Insecurity: Not on file  Transportation Needs: Not on file  Physical Activity: Not on file  Stress: Not on file  Social Connections: Not on file  Intimate Partner Violence: Not on file   Family History  Problem Relation Age of Onset   Heart attack Sister 46       S/P CABG   Heart attack Father    Deep vein thrombosis Father     Current Outpatient Medications  Medication Sig Dispense Refill   albuterol (VENTOLIN HFA) 108 (90 Base) MCG/ACT inhaler Inhale 2 puffs into the lungs every 6 (six) hours as needed for wheezing or shortness of breath. Use this with a spacer to get [place] effect when needed. 18 g 3   apixaban (ELIQUIS) 5 MG TABS tablet Take 1 tablet (5 mg total) by mouth 2 (two) times daily. 180 tablet 3   atorvastatin (LIPITOR) 80 MG tablet Take 1 tablet (80 mg total) by  mouth daily. 90 tablet 3   Budeson-Glycopyrrol-Formoterol (BREZTRI AEROSPHERE) 160-9-4.8 MCG/ACT AERO Inhale 2 puffs into the lungs in the morning and at bedtime. 10.7 g 4   carvedilol (COREG) 12.5 MG tablet Take 1.5 tablets (18.75 mg total) by mouth 2 (two) times daily. 90 tablet 3   empagliflozin (JARDIANCE) 10 MG TABS tablet Take 1 tablet (10 mg total) by mouth daily before breakfast. 90 tablet 3   ezetimibe (ZETIA) 10 MG tablet Take 1 tablet (10 mg total) by mouth daily. 30 tablet 11   fenofibrate (TRICOR) 145 MG tablet Take 1 tablet (145 mg  total) by mouth daily. 90 tablet 3   ferrous sulfate 325 (65 FE) MG tablet Take 325 mg by mouth daily.     folic acid (FOLVITE) 1 MG tablet Take 1 mg by mouth daily.     furosemide (LASIX) 40 MG tablet Take 1 tablet (40 mg total) by mouth as directed every Monday, Wednesday, and Friday 60 tablet 3   potassium chloride SA (KLOR-CON M) 20 MEQ tablet Take 1 tablet (20 mEq total) by mouth daily. 60 tablet 11   sacubitril-valsartan (ENTRESTO) 97-103 MG Take 1 tablet by mouth 2 (two) times daily. 180 tablet 3   Spacer/Aero-Holding Chambers (AEROCHAMBER PLUS WITH MASK) inhaler use as directed 1 each 0   spironolactone (ALDACTONE) 25 MG tablet Take 1 tablet (25 mg total) by mouth in the morning. 90 tablet 2   No current facility-administered medications for this encounter.   BP 102/74   Pulse 72   Wt 124.5 kg (274 lb 6.4 oz)   SpO2 97%   BMI 38.27 kg/m   Wt Readings from Last 3 Encounters:  12/31/22 124.5 kg (274 lb 6.4 oz)  08/31/22 124.3 kg (274 lb)  08/28/22 122 kg (269 lb)    PHYSICAL EXAM: General: NAD Neck: Thick. No JVD, no thyromegaly or thyroid nodule.  Lungs: Clear to auscultation bilaterally with normal respiratory effort. CV: Nondisplaced PMI.  Heart regular S1/S2, no S3/S4, no murmur.  No peripheral edema.  No carotid bruit.  Normal pedal pulses.  Abdomen: Soft, nontender, no hepatosplenomegaly, no distention.  Skin: Intact without  lesions or rashes.  Neurologic: Alert and oriented x 3.  Psych: Normal affect. Extremities: No clubbing or cyanosis.  HEENT: Normal.   ASSESSMENT & PLAN: 1. Chronic systolic CHF: Ischemic cardiomyopathy. Echo in 6/19 with EF 25-30%, echo 9/19 with EF 30-35%.  Medtronic ICD.  Echo in 8/21 showed EF up to 45%.  Echo in 11/22 showed EF 45-50%. Echo in 11/23 showed  EF 45% with global hypokinesis, mild RV dysfunction, s/p MV repair with no MR and mild-moderate MS mean gradient 5 mmHg, IVC normal.  Echo 11/24: EF 35-40%, RV mildly reduced, mean gradient of 5.5 mmHg across mitral valve - NYHA II/early III. Dyspnea likely multifactorial, COPD/asthma, paralyzed right hemidiaphragm and BMI likely all playing a role. Volume stable on exam and by device. Continue lasix 40 mg three times a week.  - Continue spironolactone 25 mg daily.   - Continue Coreg 18.75 mg bid.  - Continue Entresto 97/103 bid.   - Continue empagliflozin 10 mg daily.  - Has some orthostatic dizziness. Not overly bothersome. Will continue current GDMT. He will let us know if more of an issue before follow-up. - BMET/BNP today. 2. CAD: Stents in LCx and RCA. LHC 02/2017 showed nonobstructive disease. Cardiac PET in 1/24 showed EF 47%, minimal ischemia, low risk. Rare atypical chest pain. - Continue atorvastatin 80 mg daily - He is on apixaban, so no ASA.  3. PE: In 2019, no definite trigger. Father with h/o VTE.  He should have long-term anticoagulation.   - Continue Eliquis. No bleeding issues. 4. Mitral regurgitation: Ischemic MR.  S/p minimally invasive MV repair in 6/19. Post-op echo 6/19 showed no MR but mean gradient 9 mmHg across MV. Echo 11/24: No MR, mean gradient 5.5 mmHg across MV 5. 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 Contributory to his dyspnea.  - He is followed by Pulmonary. - Needs weight loss and exercise 6. Hyperlipidemia:  - Continue atorvastatin 80  mg daily.  - Continue Zetia 10  mg daily.   - LDL at goal, 43 in 08/24 7. OSA: Using Bipap.   8. Obesity: Body mass index is 38.27 kg/m. - Discussed referral to PharmD for semaglutide/tirzepatide. He is not diabetic, so insurance coverage is currently a barrier. He is working on diet and has lost about 10 lb. He would prefer to try to lose weight on his own before considering another medication.  Follow up 4-6 months with Dr. Shirlee Latch  [facility], [place]  12/31/2022

## 2022-12-31 NOTE — Patient Instructions (Signed)
Routine lab work today. Will notify you of abnormal results  Follow up in 4 months with Dr.McLean   Do the following things EVERYDAY: Weigh yourself in the morning before breakfast. Write it down and keep it in a log. Take your medicines as prescribed Eat low salt foods--Limit salt (sodium) to 2000 mg per day.  Stay as active as you can everyday Limit all fluids for the day to less than 2 liters

## 2023-01-01 ENCOUNTER — Telehealth (HOSPITAL_COMMUNITY): Payer: Self-pay

## 2023-01-01 DIAGNOSIS — I5022 Chronic systolic (congestive) heart failure: Secondary | ICD-10-CM

## 2023-01-01 NOTE — Telephone Encounter (Signed)
Spoke with patient regarding the following results. Patient made aware and patient verbalized understanding. Patient scheduled for repeat BMET next Tuesday. Patient aware of appointment time and date.

## 2023-01-01 NOTE — Telephone Encounter (Signed)
-----   Message from [facility] [place] sent at 01/01/2023 11:16 AM EST ----- BNP okay. No BMET? Looks like order cancelled? Suggest BMET soon

## 2023-01-05 ENCOUNTER — Ambulatory Visit (HOSPITAL_COMMUNITY)
Admission: RE | Admit: 2023-01-05 | Discharge: 2023-01-05 | Disposition: A | Discharge status: Home / Self Care | Payer: Medicare Other | Source: Ambulatory Visit | Attending: Cardiology | Admitting: Cardiology

## 2023-01-05 ENCOUNTER — Other Ambulatory Visit (HOSPITAL_COMMUNITY): Payer: Medicare Other

## 2023-01-05 DIAGNOSIS — I5022 Chronic systolic (congestive) heart failure: Secondary | ICD-10-CM | POA: Diagnosis present

## 2023-01-05 LAB — BASIC METABOLIC PANEL
Anion gap: 8 (ref 5–15)
BUN: 12 mg/dL (ref 6–20)
CO2: 25 mmol/L (ref 22–32)
Calcium: 9.1 mg/dL (ref 8.9–10.3)
Chloride: 108 mmol/L (ref 98–111)
Creatinine, Ser: 1.16 mg/dL (ref 0.61–1.24)
GFR, Estimated: >60 mL/min (ref >60)
Glucose, Bld: 107 mg/dL — ABNORMAL HIGH (ref 70–99)
Potassium: 4.2 mmol/L (ref 3.5–5.1)
Sodium: 141 mmol/L (ref 135–145)

## 2023-01-21 ENCOUNTER — Encounter: Payer: Self-pay | Admitting: Cardiology

## 2023-01-22 ENCOUNTER — Other Ambulatory Visit (HOSPITAL_COMMUNITY): Payer: Self-pay

## 2023-01-22 ENCOUNTER — Other Ambulatory Visit (HOSPITAL_COMMUNITY): Payer: Self-pay | Admitting: Cardiology

## 2023-01-22 MED ORDER — CARVEDILOL 12.5 MG PO TABS
18.7500 mg | ORAL_TABLET | Freq: Two times a day (BID) | ORAL | 3 refills | Status: DC
  Filled 2023-01-22: qty 270, 90d supply, fill #0
  Filled 2023-05-09: qty 270, 90d supply, fill #1
  Filled 2023-08-10: qty 270, 90d supply, fill #2
  Filled 2023-08-11: qty 180, 60d supply, fill #2

## 2023-02-04 ENCOUNTER — Ambulatory Visit (INDEPENDENT_AMBULATORY_CARE_PROVIDER_SITE_OTHER): Payer: Medicare Other

## 2023-02-04 ENCOUNTER — Other Ambulatory Visit (HOSPITAL_COMMUNITY): Payer: Self-pay

## 2023-02-04 DIAGNOSIS — I255 Ischemic cardiomyopathy: Secondary | ICD-10-CM | POA: Diagnosis not present

## 2023-02-04 LAB — CUP PACEART REMOTE DEVICE CHECK
Battery Remaining Longevity: 85 mo
Battery Voltage: 3 V
Brady Statistic RV Percent Paced: 0 %
Date Time Interrogation Session: 20250116022601
HighPow Impedance: 74 Ohm
Implantable Lead Connection Status: 753985
Implantable Lead Implant Date: 20191031
Implantable Lead Location: 753860
Implantable Lead Model: 6935
Implantable Pulse Generator Implant Date: 20191031
Lead Channel Impedance Value: 361 Ohm
Lead Channel Impedance Value: 418 Ohm
Lead Channel Pacing Threshold Amplitude: 0.625 V
Lead Channel Pacing Threshold Pulse Width: 0.4 ms
Lead Channel Sensing Intrinsic Amplitude: 10.125 mV
Lead Channel Sensing Intrinsic Amplitude: 10.125 mV
Lead Channel Setting Pacing Amplitude: 2 V
Lead Channel Setting Pacing Pulse Width: 0.4 ms
Lead Channel Setting Sensing Sensitivity: 0.45 mV
Zone Setting Status: 755011
Zone Setting Status: 755011

## 2023-03-08 ENCOUNTER — Ambulatory Visit (HOSPITAL_COMMUNITY)
Admission: RE | Admit: 2023-03-08 | Discharge: 2023-03-08 | Disposition: A | Discharge status: Home / Self Care | Payer: Medicare Other | Source: Ambulatory Visit | Attending: Family Medicine | Admitting: Family Medicine

## 2023-03-08 ENCOUNTER — Telehealth (HOSPITAL_COMMUNITY): Payer: Self-pay | Admitting: Cardiology

## 2023-03-08 ENCOUNTER — Other Ambulatory Visit (HOSPITAL_COMMUNITY): Payer: Self-pay

## 2023-03-08 ENCOUNTER — Encounter (HOSPITAL_COMMUNITY): Payer: Self-pay

## 2023-03-08 VITALS — BP 98/64 | HR 73 | Wt 277.0 lb

## 2023-03-08 DIAGNOSIS — Z7984 Long term (current) use of oral hypoglycemic drugs: Secondary | ICD-10-CM | POA: Diagnosis not present

## 2023-03-08 DIAGNOSIS — Z79899 Other long term (current) drug therapy: Secondary | ICD-10-CM | POA: Insufficient documentation

## 2023-03-08 DIAGNOSIS — Z6838 Body mass index (BMI) 38.0-38.9, adult: Secondary | ICD-10-CM | POA: Diagnosis not present

## 2023-03-08 DIAGNOSIS — I5022 Chronic systolic (congestive) heart failure: Secondary | ICD-10-CM | POA: Diagnosis present

## 2023-03-08 DIAGNOSIS — E669 Obesity, unspecified: Secondary | ICD-10-CM | POA: Diagnosis not present

## 2023-03-08 DIAGNOSIS — I252 Old myocardial infarction: Secondary | ICD-10-CM | POA: Diagnosis not present

## 2023-03-08 DIAGNOSIS — Z7901 Long term (current) use of anticoagulants: Secondary | ICD-10-CM | POA: Insufficient documentation

## 2023-03-08 DIAGNOSIS — E785 Hyperlipidemia, unspecified: Secondary | ICD-10-CM | POA: Insufficient documentation

## 2023-03-08 DIAGNOSIS — R42 Dizziness and giddiness: Secondary | ICD-10-CM | POA: Insufficient documentation

## 2023-03-08 DIAGNOSIS — I340 Nonrheumatic mitral (valve) insufficiency: Secondary | ICD-10-CM | POA: Diagnosis not present

## 2023-03-08 DIAGNOSIS — G4733 Obstructive sleep apnea (adult) (pediatric): Secondary | ICD-10-CM | POA: Insufficient documentation

## 2023-03-08 DIAGNOSIS — Z87891 Personal history of nicotine dependence: Secondary | ICD-10-CM | POA: Insufficient documentation

## 2023-03-08 DIAGNOSIS — I2510 Atherosclerotic heart disease of native coronary artery without angina pectoris: Secondary | ICD-10-CM | POA: Diagnosis not present

## 2023-03-08 DIAGNOSIS — I255 Ischemic cardiomyopathy: Secondary | ICD-10-CM | POA: Diagnosis not present

## 2023-03-08 DIAGNOSIS — R0789 Other chest pain: Secondary | ICD-10-CM | POA: Diagnosis not present

## 2023-03-08 DIAGNOSIS — Z86711 Personal history of pulmonary embolism: Secondary | ICD-10-CM | POA: Diagnosis not present

## 2023-03-08 DIAGNOSIS — R042 Hemoptysis: Secondary | ICD-10-CM

## 2023-03-08 DIAGNOSIS — J449 Chronic obstructive pulmonary disease, unspecified: Secondary | ICD-10-CM | POA: Diagnosis not present

## 2023-03-08 LAB — CBC
HCT: 43.8 % (ref 39.0–52.0)
Hemoglobin: 14.2 g/dL (ref 13.0–17.0)
MCH: 31.7 pg (ref 26.0–34.0)
MCHC: 32.4 g/dL (ref 30.0–36.0)
MCV: 97.8 fL (ref 80.0–100.0)
Platelets: 211 10*3/uL (ref 150–400)
RBC: 4.48 MIL/uL (ref 4.22–5.81)
RDW: 12.2 % (ref 11.5–15.5)
WBC: 7.6 10*3/uL (ref 4.0–10.5)
nRBC: 0 % (ref 0.0–0.2)

## 2023-03-08 LAB — BASIC METABOLIC PANEL
Anion gap: 6 (ref 5–15)
BUN: 15 mg/dL (ref 6–20)
CO2: 25 mmol/L (ref 22–32)
Calcium: 9 mg/dL (ref 8.9–10.3)
Chloride: 108 mmol/L (ref 98–111)
Creatinine, Ser: 1.08 mg/dL (ref 0.61–1.24)
GFR, Estimated: >60 mL/min (ref >60)
Glucose, Bld: 83 mg/dL (ref 70–99)
Potassium: 4.2 mmol/L (ref 3.5–5.1)
Sodium: 139 mmol/L (ref 135–145)

## 2023-03-08 LAB — IRON AND TIBC
Iron: 81 ug/dL (ref 45–182)
Saturation Ratios: 21 % (ref 17.9–39.5)
TIBC: 384 ug/dL (ref 250–450)
UIBC: 303 ug/dL

## 2023-03-08 LAB — FERRITIN: Ferritin: 684 ng/mL — ABNORMAL HIGH (ref 24–336)

## 2023-03-08 LAB — BRAIN NATRIURETIC PEPTIDE: B Natriuretic Peptide: 37.6 pg/mL (ref 0.0–100.0)

## 2023-03-08 MED ORDER — BLOOD PRESSURE MONITOR MISC
0 refills | Status: AC
  Filled 2023-03-08: qty 1, 30d supply, fill #0

## 2023-03-08 MED ORDER — SACUBITRIL-VALSARTAN 49-51 MG PO TABS
1.0000 | ORAL_TABLET | Freq: Two times a day (BID) | ORAL | 5 refills | Status: DC
Start: 2023-03-08 — End: 2023-12-09
  Filled 2023-03-08 – 2023-07-09 (×2): qty 60, 30d supply, fill #0
  Filled 2023-08-10: qty 60, 30d supply, fill #1
  Filled 2023-09-13: qty 60, 30d supply, fill #2
  Filled 2023-10-14: qty 60, 30d supply, fill #3
  Filled 2023-11-10: qty 60, 30d supply, fill #4

## 2023-03-08 NOTE — Telephone Encounter (Signed)
Pts wife called to request an appt  Mild Dizziness and spitting up blood x 1 event  Add on 2/17 @ 230

## 2023-03-08 NOTE — Patient Instructions (Addendum)
Thank you for coming in today  If you had labs drawn today, any labs that are abnormal the clinic will call you No news is good news  Medications: Decrease Entresto to 49/51 by mouth 1 tablet  twice a day.    Follow up appointments: 4 months with Dr. Shirlee Latch Call our office in March to get scheduled    Do the following things EVERYDAY: Weigh yourself in the morning before breakfast. Write it down and keep it in a log. Take your medicines as prescribed Eat low salt foods--Limit salt (sodium) to 2000 mg per day.  Stay as active as you can everyday Limit all fluids for the day to less than 2 liters   At the [facility], you and your health needs are our priority. As part of our continuing mission to provide you with exceptional heart care, we have created designated Provider Care Teams. These Care Teams include your primary Cardiologist (physician) and Advanced Practice Providers (APPs- Physician Assistants and Nurse Practitioners) who all work together to provide you with the care you need, when you need it.   You may see any of the following providers on your designated Care Team at your next follow up: Dr Arvilla Meres Dr Marca Ancona Dr. Marcos Eke, NP Robbie Lis, [place] [facility] [place], [place] Brynda Peon, NP Karle Plumber, PharmD   Please be sure to bring in all your medications bottles to every appointment.    Thank you for choosing [facility]  If you have any questions or concerns before your next appointment please send [place] a message through [place] or call our office at [phone].    TO LEAVE A MESSAGE FOR THE NURSE SELECT OPTION 2, PLEASE LEAVE A MESSAGE INCLUDING: YOUR NAME DATE OF BIRTH CALL BACK NUMBER REASON FOR CALL**this is important as we prioritize the call backs  YOU WILL RECEIVE A CALL BACK THE SAME DAY AS LONG AS YOU CALL BEFORE 4:00 PM

## 2023-03-08 NOTE — Progress Notes (Signed)
PCP: Arvilla Market, MD (Inactive) HF Cardiology: Dr Shirlee Latch   HPI: Joshua May is a 48 y.o. male AA male with a history of CAD dating back to 2007 when he had a PCI in [place]. In 2010 he presented with a STEMI and had an RCA DES placed. He followed up for a year but then didn't present again till Oct 2017 when he presented with a CX infarct. He had CX PCI with DES and OM3 POBA. His EF then was 50-55%.  Admitted 02/27/17 with increased dyspnea and chest pain. CTA confirmed PE. ECHO showed EF 20-25%. LHC as noted below.   S/P Minimally invasive mitral valve repair on 6/19. ECHO 07/12/17 post surgery, no MR was noted but EF remained 25-30%.   CPX was submaximal in 8/19, suggestive of marked deconditioning.  RHC in 9/19 showed relatively preserved cardiac index with only mildly elevated filling pressures.  Echo in 9/19 showed EF 30-35%. MDT ICD placed in 2019.   Echo 8/21 EF 45%, RV normal, s/p MV repair with mean gradient 5 and MVA 1.78 cm^2 (mild mitral stenosis), trivial MR.   Echo in 11/22: EF 45-50%, s/p MV repair with no MR, moderate mitral stenosis with mean gradient 7 mmHg and MVA 1.12 by VTI, IVC normal, RV normal.   Echo 11/23 EF 45% with global hypokinesis, mild RV dysfunction, s/p MV repair with no MR and mild-moderate MS mean gradient 5 mmHg, IVC normal.   Cardiac PET (1/24) EF 47%, old inferolateral MI, small area of ischemia at the apex. Overall low risk with minimal ischemia and no indication for cath.  Echo 11/24 EF 35-40%, RV mildly reduced, mean gradient of 5.5 mmHg across mitral valve.  Today he returns for an acute visit with complaints of "spitting up blood". Woke up this past Friday and spit up blood. CPAP has been running out of water. Occasional dizziness. Had sharp pain in left arm and chest felt tight, pain resolved spontaneously. He has SOB walking up steps. Denies palpitations, edema, or PND/Orthopnea. Appetite ok. No fever or chills. Weight at home 273 pounds.  Taking all medications. Quit smoking cigarettes in 2017. No ETOH or drug use.   ECG (personally reviewed): NSR  MDT device interrogation (personally reviewed): OptiVol ok, thoracic impedence stable, no AF, 2.5 hr/day activity, 100% VS  PMH: 1. CAD: Initial PCI in 2007 in [place], inferior MI.   - Inferior STEMI 2010 with RCA PCI.  - CFx infarct in 2017 with LCx DES and OM3 POBA.  - LHC (2/19): Nonobstructive CAD.  - Cardiac PET (1/24): EF 47%, old inferolateral MI, small area of ischemia at apex, overall low risk with minimal ischemia. 2. 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  - TEE (4/19): LVEF 35-40%, Severe MR with PISA ERO 0.43 cm^2. Suspect infarct related MR with tethering of the posterior leaflet.  - Minimally invasive MV repair in 6/19.   3. Chronic systolic CHF: Ischemic cardiomyopathy. Medtronic ICD.  - Echo (2/19): EF 25-30%, diffuse HK with inferolateral AK, mildly decreased RV systolic function with D-shaped septum, moderate-severe infarct-related MR.  - Echo (6/19): EF 25-30%, apical and inferolateral hypokinesis, MV repair with mean gradient 9 mmHg with no MR, mildly decreased RV systolic function.  - CPX (8/19): VO2 13.2, VE/VCO2 slope 28, RER 0.87.  This study was suggestive of severe deconditioning.  - RHC (9/19): mean RA 9, PA 39/23 mean 24, mean PCWP 17, CI 2.2, PVR 1.35 WU - Echo (9/19): EF 30-35%, diffuse hypokinesis, normal RV, s/p MV  repair with mean gradient 6 mmHg and PHT 104 msec => probably no significant stenosis.  - Echo (8/21): EF 45%, RV normal, s/p MV repair with mean gradient 5 and MVA 1.78 cm^2 (mild mitral stenosis), trivial MR.  - Echo (11/22): EF 45-50%, s/p MV repair with no MR, moderate mitral stenosis with mean gradient 7 mmHg and MVA 1.12 by VTI, IVC normal, RV normal.  - Echo (11/23): EF 45% with global hypokinesis, mild RV dysfunction, s/p MV repair with no MR and mild-moderate MS mean gradient 5 mmHg, IVC normal.  - Echo 11/24: EF 35-40%, RV mildly  reduced, mean gradient of 5.5 mmhg across mitral valve 4. Hyperlipidemia 5. PE: 2/19.  6. COPD: Moderate COPD on 6/19 PFTs.  7. OSA: Uses Bipap  Review of systems complete and found to be negative unless listed in HPI.    Social History   Socioeconomic History   Marital status: Married    Spouse name: Not on file   Number of children: Not on file   Years of education: Not on file   Highest education level: Not on file  Occupational History   Not on file  Tobacco Use   Smoking status: Former    Current packs/day: 0.50    Average packs/day: 0.5 packs/day for 17.0 years (8.5 ttl pk-yrs)    Types: Cigarettes   Smokeless tobacco: Never   Tobacco comments:    quit since heart attack in 10/2015  Vaping Use   Vaping status: Never Used  Substance and Sexual Activity   Alcohol use: Not Currently   Drug use: Not Currently    Types: Marijuana   Sexual activity: Not on file  Other Topics Concern   Not on file  Social History Narrative   The patient is married with two children.   Social Drivers of Corporate investment banker Strain: Not on file  Food Insecurity: Not on file  Transportation Needs: Not on file  Physical Activity: Not on file  Stress: Not on file  Social Connections: Not on file  Intimate Partner Violence: Not on file   Family History  Problem Relation Age of Onset   Heart attack Sister 48       S/P CABG   Heart attack Father    Deep vein thrombosis Father     Current Outpatient Medications  Medication Sig Dispense Refill   albuterol (VENTOLIN HFA) 108 (90 Base) MCG/ACT inhaler Inhale 2 puffs into the lungs every 6 (six) hours as needed for wheezing or shortness of breath. Use this with a spacer to get [place] effect when needed. 18 g 3   apixaban (ELIQUIS) 5 MG TABS tablet Take 1 tablet (5 mg total) by mouth 2 (two) times daily. 180 tablet 3   atorvastatin (LIPITOR) 80 MG tablet Take 1 tablet (80 mg total) by mouth daily. 90 tablet 3    Budeson-Glycopyrrol-Formoterol (BREZTRI AEROSPHERE) 160-9-4.8 MCG/ACT AERO Inhale 2 puffs into the lungs in the morning and at bedtime. 10.7 g 4   carvedilol (COREG) 12.5 MG tablet Take 1.5 tablets (18.75 mg total) by mouth 2 (two) times daily. 180 tablet 3   empagliflozin (JARDIANCE) 10 MG TABS tablet Take 1 tablet (10 mg total) by mouth daily before breakfast. 90 tablet 3   ezetimibe (ZETIA) 10 MG tablet Take 1 tablet (10 mg total) by mouth daily. 30 tablet 11   fenofibrate (TRICOR) 145 MG tablet Take 1 tablet (145 mg total) by mouth daily. 90 tablet 3   ferrous  sulfate 325 (65 FE) MG tablet Take 325 mg by mouth daily.     folic acid (FOLVITE) 1 MG tablet Take 1 mg by mouth daily.     furosemide (LASIX) 40 MG tablet Take 1 tablet (40 mg total) by mouth as directed every Monday, Wednesday, and Friday 60 tablet 3   potassium chloride SA (KLOR-CON M) 20 MEQ tablet Take 1 tablet (20 mEq total) by mouth daily. 60 tablet 11   sacubitril-valsartan (ENTRESTO) 97-103 MG Take 1 tablet by mouth 2 (two) times daily. 180 tablet 3   Spacer/Aero-Holding Chambers (AEROCHAMBER PLUS WITH MASK) inhaler use as directed 1 each 0   spironolactone (ALDACTONE) 25 MG tablet Take 1 tablet (25 mg total) by mouth in the morning. 90 tablet 2   No current facility-administered medications for this encounter.   BP 98/64   Pulse 73   Wt 125.6 kg (277 lb)   SpO2 97%   BMI 38.63 kg/m   Wt Readings from Last 3 Encounters:  03/08/23 125.6 kg (277 lb)  12/31/22 124.5 kg (274 lb 6.4 oz)  08/31/22 124.3 kg (274 lb)    PHYSICAL EXAM: General:  NAD. No resp difficulty, walked into clinic, fatigued-appearing HEENT: Normal Neck: Supple. No JVD. Cor: Regular rate & rhythm. No rubs, gallops or murmurs. Lungs: Clear, diminished in bases. Abdomen: Soft, nontender, nondistended.  Extremities: No cyanosis, clubbing, rash, edema Neuro: Alert & oriented x 3, moves all 4 extremities w/o difficulty. Affect pleasant.  ASSESSMENT &  PLAN: 1. Chronic systolic CHF: Ischemic cardiomyopathy. Echo in 6/19 with EF 25-30%, echo 9/19 with EF 30-35%.  Medtronic ICD.  Echo in 8/21 showed EF up to 45%.  Echo in 11/22 showed EF 45-50%. Echo in 11/23 showed  EF 45% with global hypokinesis, mild RV dysfunction, s/p MV repair with no MR and mild-moderate MS mean gradient 5 mmHg, IVC normal.  Echo 11/24: EF 35-40%, RV mildly reduced, mean gradient of 5.5 mmHg across mitral valve. NYHA II/early III. Dyspnea likely multifactorial, COPD/asthma, paralyzed right hemidiaphragm and BMI likely all playing a role. Volume stable on exam and by device interrogation. - With soft BP and dizziness, decrease Entresto to 49/51 mg bid. - Continue Lasix 40 mg 3x/week. BMET and BNP today. - Continue spironolactone 25 mg daily.   - Continue Coreg 18.75 mg bid.  - Continue empagliflozin 10 mg daily.  2. CAD: Stents in LCx and RCA. LHC 02/2017 showed nonobstructive disease. Cardiac PET in 1/24 showed EF 47%, minimal ischemia, low risk. Rare atypical chest pain. ECG today without acute changes. - Continue atorvastatin 80 mg daily. - He is on apixaban, so no ASA.  3. PE: In 2019, no definite trigger. Father with h/o VTE.  He should have long-term anticoagulation.   - Continue Eliquis. Had recent hemoptysis. CBC today (see below) 4. Mitral regurgitation: Ischemic MR.  S/p minimally invasive MV repair in 6/19. Post-op echo 6/19 showed no MR but mean gradient 9 mmHg across MV. Echo 11/24: No MR, mean gradient 5.5 mmHg across MV 5. 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 Contributory to his dyspnea.  - He is followed by Pulmonary. - Needs weight loss and exercise 6. Hyperlipidemia:  - Continue atorvastatin 80 mg daily. LDL 43 8/24. - Continue Zetia 10 mg daily.   7. OSA: Using Bipap.  No change. 8. Obesity: Body mass index is 38.63 kg/m. - Continue weight loss efforts. Consider GLP1RA. 9. Hemoptysis: 1 episode, self resolved. ?  If  related to  decreased humidification on BiPAP. I asked him to call DME company to help troubleshoot. - Check iron panel and CBC.  - Follow up with PCP  Follow up 4 months with Dr. Shirlee Latch.  Anderson Malta [facility] FNP-BC 03/08/2023

## 2023-03-10 ENCOUNTER — Telehealth (HOSPITAL_COMMUNITY): Payer: Self-pay

## 2023-03-10 ENCOUNTER — Other Ambulatory Visit (HOSPITAL_COMMUNITY): Payer: Self-pay

## 2023-03-10 NOTE — Telephone Encounter (Signed)
-----   Message from [place] sent at 03/10/2023  9:56 AM EST ----- Ferritin levels are elevated but other labs stable.  Elevated ferritin is a non-specific inflammatory marker. Recommend follow up with PCP

## 2023-03-10 NOTE — Telephone Encounter (Signed)
Spoke with patient regarding the following results. Patient made aware and patient verbalized understanding.   Will forward results over to patients PCP- patient aware and verbalized understanding.

## 2023-03-11 ENCOUNTER — Other Ambulatory Visit (HOSPITAL_COMMUNITY): Payer: Self-pay

## 2023-03-15 NOTE — Progress Notes (Signed)
 Remote ICD transmission.

## 2023-03-30 ENCOUNTER — Encounter: Payer: Self-pay | Admitting: Cardiology

## 2023-05-06 ENCOUNTER — Ambulatory Visit (INDEPENDENT_AMBULATORY_CARE_PROVIDER_SITE_OTHER): Payer: Medicare Other

## 2023-05-06 DIAGNOSIS — I255 Ischemic cardiomyopathy: Secondary | ICD-10-CM

## 2023-05-07 LAB — CUP PACEART REMOTE DEVICE CHECK
Battery Remaining Longevity: 78 mo
Battery Voltage: 2.99 V
Brady Statistic RV Percent Paced: 0.04 %
Date Time Interrogation Session: 20250417001605
HighPow Impedance: 79 Ohm
Implantable Lead Connection Status: 753985
Implantable Lead Implant Date: 20191031
Implantable Lead Location: 753860
Implantable Lead Model: 6935
Implantable Pulse Generator Implant Date: 20191031
Lead Channel Impedance Value: 361 Ohm
Lead Channel Impedance Value: 418 Ohm
Lead Channel Pacing Threshold Amplitude: 0.75 V
Lead Channel Pacing Threshold Pulse Width: 0.4 ms
Lead Channel Sensing Intrinsic Amplitude: 11 mV
Lead Channel Sensing Intrinsic Amplitude: 11 mV
Lead Channel Setting Pacing Amplitude: 2 V
Lead Channel Setting Pacing Pulse Width: 0.4 ms
Lead Channel Setting Sensing Sensitivity: 0.45 mV
Zone Setting Status: 755011
Zone Setting Status: 755011

## 2023-05-09 ENCOUNTER — Other Ambulatory Visit (HOSPITAL_COMMUNITY): Payer: Self-pay | Admitting: Family Medicine

## 2023-05-09 ENCOUNTER — Other Ambulatory Visit: Payer: Self-pay | Admitting: Cardiology

## 2023-05-09 DIAGNOSIS — I5022 Chronic systolic (congestive) heart failure: Secondary | ICD-10-CM

## 2023-05-10 ENCOUNTER — Other Ambulatory Visit (HOSPITAL_COMMUNITY): Payer: Self-pay

## 2023-05-12 ENCOUNTER — Other Ambulatory Visit (HOSPITAL_COMMUNITY): Payer: Self-pay

## 2023-05-12 MED ORDER — POTASSIUM CHLORIDE CRYS ER 20 MEQ PO TBCR
20.0000 meq | EXTENDED_RELEASE_TABLET | Freq: Every day | ORAL | 11 refills | Status: AC
  Filled 2023-05-12: qty 60, 60d supply, fill #0
  Filled 2023-05-20 – 2023-07-19 (×2): qty 60, 60d supply, fill #1
  Filled 2023-09-13: qty 60, 60d supply, fill #2
  Filled 2023-11-15: qty 60, 60d supply, fill #3
  Filled 2024-01-18: qty 60, 60d supply, fill #4

## 2023-05-12 MED ORDER — ATORVASTATIN CALCIUM 80 MG PO TABS
80.0000 mg | ORAL_TABLET | Freq: Every day | ORAL | 3 refills | Status: AC
  Filled 2023-05-12: qty 90, 90d supply, fill #0
  Filled 2023-05-20 – 2023-08-10 (×2): qty 90, 90d supply, fill #1
  Filled 2023-11-15: qty 90, 90d supply, fill #2
  Filled 2024-02-16: qty 90, 90d supply, fill #3

## 2023-05-20 ENCOUNTER — Other Ambulatory Visit (HOSPITAL_COMMUNITY): Payer: Self-pay

## 2023-05-31 ENCOUNTER — Other Ambulatory Visit (HOSPITAL_COMMUNITY): Payer: Self-pay | Admitting: Cardiology

## 2023-06-02 ENCOUNTER — Other Ambulatory Visit (HOSPITAL_COMMUNITY): Payer: Self-pay

## 2023-06-02 ENCOUNTER — Other Ambulatory Visit (HOSPITAL_COMMUNITY): Payer: Self-pay | Admitting: Cardiology

## 2023-06-02 MED ORDER — FENOFIBRATE 145 MG PO TABS
145.0000 mg | ORAL_TABLET | Freq: Every day | ORAL | 3 refills | Status: AC
  Filled 2023-06-02: qty 90, 90d supply, fill #0
  Filled 2023-09-01 (×2): qty 90, 90d supply, fill #1
  Filled 2023-11-29: qty 90, 90d supply, fill #2

## 2023-06-02 MED ORDER — APIXABAN 5 MG PO TABS
5.0000 mg | ORAL_TABLET | Freq: Two times a day (BID) | ORAL | 3 refills | Status: AC
  Filled 2023-06-02: qty 180, 90d supply, fill #0
  Filled 2023-09-01: qty 180, 90d supply, fill #1
  Filled 2023-12-09: qty 180, 90d supply, fill #2

## 2023-06-16 NOTE — Progress Notes (Signed)
 Remote ICD transmission.

## 2023-06-16 NOTE — Addendum Note (Signed)
 Addended by: Lott Rouleau A on: 06/16/2023 09:49 AM   Modules accepted: Orders

## 2023-07-09 ENCOUNTER — Other Ambulatory Visit: Payer: Self-pay

## 2023-07-09 ENCOUNTER — Other Ambulatory Visit (HOSPITAL_COMMUNITY): Payer: Self-pay

## 2023-07-19 ENCOUNTER — Other Ambulatory Visit (HOSPITAL_COMMUNITY): Payer: Self-pay | Admitting: Cardiology

## 2023-07-20 ENCOUNTER — Other Ambulatory Visit (HOSPITAL_COMMUNITY): Payer: Self-pay

## 2023-07-20 ENCOUNTER — Other Ambulatory Visit (HOSPITAL_COMMUNITY): Payer: Self-pay | Admitting: Cardiology

## 2023-07-21 ENCOUNTER — Other Ambulatory Visit (HOSPITAL_COMMUNITY): Payer: Self-pay

## 2023-07-21 MED ORDER — SPIRONOLACTONE 25 MG PO TABS
25.0000 mg | ORAL_TABLET | Freq: Every morning | ORAL | 2 refills | Status: AC
  Filled 2023-07-21: qty 90, 90d supply, fill #0
  Filled 2023-10-21: qty 90, 90d supply, fill #1
  Filled 2024-01-18 (×2): qty 90, 90d supply, fill #2

## 2023-08-05 ENCOUNTER — Ambulatory Visit: Payer: Medicare Other

## 2023-08-05 DIAGNOSIS — I255 Ischemic cardiomyopathy: Secondary | ICD-10-CM | POA: Diagnosis not present

## 2023-08-06 LAB — CUP PACEART REMOTE DEVICE CHECK
Battery Remaining Longevity: 75 mo
Battery Voltage: 2.99 V
Brady Statistic RV Percent Paced: 0.03 %
Date Time Interrogation Session: 20250717044223
HighPow Impedance: 72 Ohm
Implantable Lead Connection Status: 753985
Implantable Lead Implant Date: 20191031
Implantable Lead Location: 753860
Implantable Lead Model: 6935
Implantable Pulse Generator Implant Date: 20191031
Lead Channel Impedance Value: 361 Ohm
Lead Channel Impedance Value: 456 Ohm
Lead Channel Pacing Threshold Amplitude: 0.625 V
Lead Channel Pacing Threshold Pulse Width: 0.4 ms
Lead Channel Sensing Intrinsic Amplitude: 10.75 mV
Lead Channel Sensing Intrinsic Amplitude: 10.75 mV
Lead Channel Setting Pacing Amplitude: 2 V
Lead Channel Setting Pacing Pulse Width: 0.4 ms
Lead Channel Setting Sensing Sensitivity: 0.45 mV
Zone Setting Status: 755011
Zone Setting Status: 755011

## 2023-08-10 ENCOUNTER — Other Ambulatory Visit: Payer: Self-pay

## 2023-08-10 ENCOUNTER — Other Ambulatory Visit (HOSPITAL_COMMUNITY): Payer: Self-pay

## 2023-08-11 ENCOUNTER — Other Ambulatory Visit (HOSPITAL_COMMUNITY): Payer: Self-pay

## 2023-08-21 ENCOUNTER — Ambulatory Visit: Payer: Self-pay | Admitting: Cardiology

## 2023-09-01 ENCOUNTER — Other Ambulatory Visit (HOSPITAL_COMMUNITY): Payer: Self-pay | Admitting: Cardiology

## 2023-09-02 ENCOUNTER — Other Ambulatory Visit (HOSPITAL_COMMUNITY): Payer: Self-pay

## 2023-09-02 ENCOUNTER — Other Ambulatory Visit (HOSPITAL_COMMUNITY): Payer: Self-pay | Admitting: Cardiology

## 2023-09-02 MED ORDER — EZETIMIBE 10 MG PO TABS
10.0000 mg | ORAL_TABLET | Freq: Every day | ORAL | 11 refills | Status: AC
  Filled 2023-09-02: qty 30, 30d supply, fill #0
  Filled 2023-09-30: qty 30, 30d supply, fill #1
  Filled 2023-11-15: qty 30, 30d supply, fill #2
  Filled 2023-12-09: qty 30, 30d supply, fill #3
  Filled 2024-01-10: qty 30, 30d supply, fill #4
  Filled 2024-02-10: qty 30, 30d supply, fill #5

## 2023-09-06 ENCOUNTER — Telehealth (HOSPITAL_COMMUNITY): Payer: Self-pay | Admitting: Cardiology

## 2023-09-06 NOTE — Telephone Encounter (Signed)
 Called to confirm/remind patient of their appointment at the [facility] on 09/06/2023.   Appointment:   [x] Confirmed  [] Left mess   [] No answer/No voice mail  [] VM Full/unable to leave message  [] Phone not in service  Patient reminded to bring all medications and/or complete list.  Confirmed patient has transportation. Gave directions, instructed to utilize valet parking.

## 2023-09-07 ENCOUNTER — Ambulatory Visit (HOSPITAL_COMMUNITY): Payer: Self-pay | Admitting: Cardiology

## 2023-09-07 ENCOUNTER — Encounter (HOSPITAL_COMMUNITY): Payer: Self-pay | Admitting: Cardiology

## 2023-09-07 ENCOUNTER — Ambulatory Visit (HOSPITAL_COMMUNITY)
Admission: RE | Admit: 2023-09-07 | Discharge: 2023-09-07 | Disposition: A | Discharge status: Home / Self Care | Source: Ambulatory Visit | Attending: Cardiology | Admitting: Cardiology

## 2023-09-07 VITALS — BP 100/60 | HR 75 | Wt 270.8 lb

## 2023-09-07 DIAGNOSIS — I2510 Atherosclerotic heart disease of native coronary artery without angina pectoris: Secondary | ICD-10-CM | POA: Diagnosis not present

## 2023-09-07 DIAGNOSIS — J986 Disorders of diaphragm: Secondary | ICD-10-CM | POA: Diagnosis not present

## 2023-09-07 DIAGNOSIS — J45909 Unspecified asthma, uncomplicated: Secondary | ICD-10-CM | POA: Insufficient documentation

## 2023-09-07 DIAGNOSIS — Z79899 Other long term (current) drug therapy: Secondary | ICD-10-CM | POA: Insufficient documentation

## 2023-09-07 DIAGNOSIS — E669 Obesity, unspecified: Secondary | ICD-10-CM | POA: Diagnosis not present

## 2023-09-07 DIAGNOSIS — E782 Mixed hyperlipidemia: Secondary | ICD-10-CM | POA: Diagnosis not present

## 2023-09-07 DIAGNOSIS — Z86711 Personal history of pulmonary embolism: Secondary | ICD-10-CM | POA: Insufficient documentation

## 2023-09-07 DIAGNOSIS — I340 Nonrheumatic mitral (valve) insufficiency: Secondary | ICD-10-CM | POA: Diagnosis not present

## 2023-09-07 DIAGNOSIS — I5022 Chronic systolic (congestive) heart failure: Secondary | ICD-10-CM | POA: Diagnosis not present

## 2023-09-07 DIAGNOSIS — R0609 Other forms of dyspnea: Secondary | ICD-10-CM | POA: Diagnosis present

## 2023-09-07 DIAGNOSIS — I509 Heart failure, unspecified: Secondary | ICD-10-CM | POA: Diagnosis present

## 2023-09-07 DIAGNOSIS — Z7901 Long term (current) use of anticoagulants: Secondary | ICD-10-CM | POA: Diagnosis not present

## 2023-09-07 DIAGNOSIS — Z6837 Body mass index (BMI) 37.0-37.9, adult: Secondary | ICD-10-CM | POA: Insufficient documentation

## 2023-09-07 DIAGNOSIS — Z7984 Long term (current) use of oral hypoglycemic drugs: Secondary | ICD-10-CM | POA: Insufficient documentation

## 2023-09-07 DIAGNOSIS — Z955 Presence of coronary angioplasty implant and graft: Secondary | ICD-10-CM | POA: Diagnosis not present

## 2023-09-07 DIAGNOSIS — G4733 Obstructive sleep apnea (adult) (pediatric): Secondary | ICD-10-CM | POA: Insufficient documentation

## 2023-09-07 DIAGNOSIS — J439 Emphysema, unspecified: Secondary | ICD-10-CM | POA: Insufficient documentation

## 2023-09-07 DIAGNOSIS — I252 Old myocardial infarction: Secondary | ICD-10-CM | POA: Insufficient documentation

## 2023-09-07 LAB — BASIC METABOLIC PANEL WITH GFR
Anion gap: 6 (ref 5–15)
BUN: 16 mg/dL (ref 6–20)
CO2: 25 mmol/L (ref 22–32)
Calcium: 8.8 mg/dL — ABNORMAL LOW (ref 8.9–10.3)
Chloride: 105 mmol/L (ref 98–111)
Creatinine, Ser: 0.99 mg/dL (ref 0.61–1.24)
GFR, Estimated: >60 mL/min (ref >60)
Glucose, Bld: 107 mg/dL — ABNORMAL HIGH (ref 70–99)
Potassium: 4.4 mmol/L (ref 3.5–5.1)
Sodium: 136 mmol/L (ref 135–145)

## 2023-09-07 LAB — LIPID PANEL
Cholesterol: 102 mg/dL (ref 0–200)
HDL: 23 mg/dL — ABNORMAL LOW (ref >40)
LDL Cholesterol: 47 mg/dL (ref 0–99)
Total CHOL/HDL Ratio: 4.4 ratio
Triglycerides: 159 mg/dL — ABNORMAL HIGH (ref <150)
VLDL: 32 mg/dL (ref 0–40)

## 2023-09-07 LAB — BRAIN NATRIURETIC PEPTIDE: B Natriuretic Peptide: 42.9 pg/mL (ref 0.0–100.0)

## 2023-09-07 NOTE — Patient Instructions (Signed)
 Medication Changes:  None, continue current medications  Lab Work:  Labs done today, your results will be available in MyChart, we will contact you for abnormal readings.   Testing/Procedures:  Your physician has requested that you have an echocardiogram. Echocardiography is a painless test that uses sound waves to create images of your heart. It provides your doctor with information about the size and shape of your heart and how well your heart's chambers and valves are working. This procedure takes approximately one hour. There are no restrictions for this procedure. Please do NOT wear cologne, perfume, aftershave, or lotions (deodorant is allowed). Please arrive 15 minutes prior to your appointment time. IN 4 MONTHS   Please note: We ask at that you not bring children with you during ultrasound (echo/ vascular) testing. Due to room size and safety concerns, children are not allowed in the ultrasound rooms during exams. Our front office staff cannot provide observation of children in our lobby area while testing is being conducted. An adult accompanying a patient to their appointment will only be allowed in the ultrasound room at the discretion of the ultrasound technician under special circumstances. We apologize for any inconvenience.   Special Instructions // Education:  Do the following things EVERYDAY: Weigh yourself in the morning before breakfast. Write it down and keep it in a log. Take your medicines as prescribed Eat low salt foods--Limit salt (sodium) to 2000 mg per day.  Stay as active as you can everyday Limit all fluids for the day to less than 2 liters   Follow-Up in: 4 months   At the [facility], you and your health needs are our priority. We have a designated team specialized in the treatment of Heart Failure. This Care Team includes your primary Heart Failure Specialized Cardiologist (physician), Advanced Practice Providers (APPs- Physician  Assistants and Nurse Practitioners), and Pharmacist who all work together to provide you with the care you need, when you need it.   You may see any of the following providers on your designated Care Team at your next follow up:  Dr. Toribio Fuel Dr. Ezra Shuck Dr. Ria Commander Dr. Odis Brownie Greig Mosses, NP Caffie Shed, [place] [facility] [place], [place] Beckey Coe, NP [place] Lee, NP Tinnie Redman, PharmD   Please be sure to bring in all your medications bottles to every appointment.   Need to Contact Us :  If you have any questions or concerns before your next appointment please send us  a message through [facility] or call our office at [phone].    TO LEAVE A MESSAGE FOR THE NURSE SELECT OPTION 2, PLEASE LEAVE A MESSAGE INCLUDING: YOUR NAME DATE OF BIRTH CALL BACK NUMBER REASON FOR CALL**this is important as we prioritize the call backs  YOU WILL RECEIVE A CALL BACK THE SAME DAY AS LONG AS YOU CALL BEFORE 4:00 PM

## 2023-09-07 NOTE — Progress Notes (Signed)
 PCP: Prentiss Dorothyann Maxwell, MD (Inactive) HF Cardiology: Dr Rolan   Chief complaint: CHF  HPI: Joshua May is a 48 y.o. male AA male with a history of CAD dating back to 2007 when he had a PCI in [place]. In 2010 he presented with a STEMI and had an RCA DES placed. He followed up for a year but then didn't present again till Oct 2017 when he presented with a CX infarct. He had CX PCI with DES and OM3 POBA. His EF then was 50-55%.  Admitted 02/27/17 with increased dyspnea and chest pain. CTA confirmed PE. ECHO showed EF 20-25%. LHC as noted below.   S/P Minimally invasive mitral valve repair on 6/19. ECHO 07/12/17 post surgery, no MR was noted but EF remained 25-30%.   CPX was submaximal in 8/19, suggestive of marked deconditioning.  RHC in 9/19 showed relatively preserved cardiac index with only mildly elevated filling pressures.  Echo in 9/19 showed EF 30-35%. MDT ICD placed in 2019.   Echo 8/21 EF 45%, RV normal, s/p MV repair with mean gradient 5 and MVA 1.78 cm^2 (mild mitral stenosis), trivial MR.   Echo in 11/22: EF 45-50%, s/p MV repair with no MR, moderate mitral stenosis with mean gradient 7 mmHg and MVA 1.12 by VTI, IVC normal, RV normal.   Echo 11/23 EF 45% with global hypokinesis, mild RV dysfunction, s/p MV repair with no MR and mild-moderate MS mean gradient 5 mmHg, IVC normal.   Cardiac PET (1/24) EF 47%, old inferolateral MI, small area of ischemia at the apex. Overall low risk with minimal ischemia and no indication for cath.  Echo 11/24 EF 35-40%, RV mildly reduced, mean gradient of 5.5 mmHg across mitral valve.  He returns today for followup of CHF.  He has shortness of breath walking up hills but does fine on flat ground.  Weight down 7 lbs with diet and exercise.  Rare atypical chest pain (feels twinges around his ICD).  He has been doing more walking than in the past.  He is limited by low back pain and knee pain.  He is using Bipap at night.  No lightheadedness or  palpitations.   ECG (personally reviewed): NSR, poor RWP  MDT device interrogation (personally reviewed): Fluid index < threshold, no AF/VT.   PMH: 1. CAD: Initial PCI in 2007 in [place], inferior MI.   - Inferior STEMI 2010 with RCA PCI.  - CFx infarct in 2017 with LCx DES and OM3 POBA.  - LHC (2/19): Nonobstructive CAD.  - Cardiac PET (1/24): EF 47%, old inferolateral MI, small area of ischemia at apex, overall low risk with minimal ischemia. 2. 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  - TEE (4/19): LVEF 35-40%, Severe MR with PISA ERO 0.43 cm^2. Suspect infarct related MR with tethering of the posterior leaflet.  - Minimally invasive MV repair in 6/19.   3. Chronic systolic CHF: Ischemic cardiomyopathy. Medtronic ICD.  - Echo (2/19): EF 25-30%, diffuse HK with inferolateral AK, mildly decreased RV systolic function with D-shaped septum, moderate-severe infarct-related MR.  - Echo (6/19): EF 25-30%, apical and inferolateral hypokinesis, MV repair with mean gradient 9 mmHg with no MR, mildly decreased RV systolic function.  - CPX (8/19): VO2 13.2, VE/VCO2 slope 28, RER 0.87.  This study was suggestive of severe deconditioning.  - RHC (9/19): mean RA 9, PA 39/23 mean 24, mean PCWP 17, CI 2.2, PVR 1.35 WU - Echo (9/19): EF 30-35%, diffuse hypokinesis, normal RV, s/p MV repair with mean  gradient 6 mmHg and PHT 104 msec => probably no significant stenosis.  - Echo (8/21): EF 45%, RV normal, s/p MV repair with mean gradient 5 and MVA 1.78 cm^2 (mild mitral stenosis), trivial MR.  - Echo (11/22): EF 45-50%, s/p MV repair with no MR, moderate mitral stenosis with mean gradient 7 mmHg and MVA 1.12 by VTI, IVC normal, RV normal.  - Echo (11/23): EF 45% with global hypokinesis, mild RV dysfunction, s/p MV repair with no MR and mild-moderate MS mean gradient 5 mmHg, IVC normal.  - Echo 11/24: EF 35-40%, RV mildly reduced, mean gradient of 5.5 mmhg across mitral valve 4. Hyperlipidemia 5. PE: 2/19.  6.  COPD: Moderate COPD on 6/19 PFTs.  7. OSA: Uses Bipap  Review of systems complete and found to be negative unless listed in HPI.    Social History   Socioeconomic History   Marital status: Married    Spouse name: Not on file   Number of children: Not on file   Years of education: Not on file   Highest education level: Not on file  Occupational History   Not on file  Tobacco Use   Smoking status: Former    Current packs/day: 0.50    Average packs/day: 0.5 packs/day for 17.0 years (8.5 ttl pk-yrs)    Types: Cigarettes   Smokeless tobacco: Never   Tobacco comments:    quit since heart attack in 10/2015  Vaping Use   Vaping status: Never Used  Substance and Sexual Activity   Alcohol use: Not Currently   Drug use: Not Currently    Types: Marijuana   Sexual activity: Not on file  Other Topics Concern   Not on file  Social History Narrative   The patient is married with two children.   Social Drivers of Corporate investment banker Strain: Not on file  Food Insecurity: Not on file  Transportation Needs: Not on file  Physical Activity: Not on file  Stress: Not on file  Social Connections: Not on file  Intimate Partner Violence: Not on file   Family History  Problem Relation Age of Onset   Heart attack Sister 17       S/P CABG   Heart attack Father    Deep vein thrombosis Father     Current Outpatient Medications  Medication Sig Dispense Refill   albuterol  (VENTOLIN  HFA) 108 (90 Base) MCG/ACT inhaler Inhale 2 puffs into the lungs every 6 (six) hours as needed for wheezing or shortness of breath. Use this with a spacer to get Brandau effect when needed. 18 g 3   apixaban  (ELIQUIS ) 5 MG TABS tablet Take 1 tablet (5 mg total) by mouth 2 (two) times daily. 180 tablet 3   atorvastatin  (LIPITOR ) 80 MG tablet Take 1 tablet (80 mg total) by mouth daily. 90 tablet 3   Budeson-Glycopyrrol-Formoterol  (BREZTRI  AEROSPHERE) 160-9-4.8 MCG/ACT AERO Inhale 2 puffs into the lungs in the  morning and at bedtime. 10.7 g 4   carvedilol  (COREG ) 12.5 MG tablet Take 1.5 tablets (18.75 mg total) by mouth 2 (two) times daily. 180 tablet 3   empagliflozin  (JARDIANCE ) 10 MG TABS tablet Take 1 tablet (10 mg total) by mouth daily before breakfast. 90 tablet 3   ezetimibe  (ZETIA ) 10 MG tablet Take 1 tablet (10 mg total) by mouth daily. 30 tablet 11   fenofibrate  (TRICOR ) 145 MG tablet Take 1 tablet (145 mg total) by mouth daily. 90 tablet 3   ferrous sulfate  325 (65  FE) MG tablet Take 325 mg by mouth daily.     folic acid  (FOLVITE ) 1 MG tablet Take 1 mg by mouth daily.     furosemide  (LASIX ) 40 MG tablet Take 1 tablet (40 mg total) by mouth as directed every Monday, Wednesday, and Friday 60 tablet 3   potassium chloride  SA (KLOR-CON  M) 20 MEQ tablet Take 1 tablet (20 mEq total) by mouth daily. 60 tablet 11   sacubitril -valsartan  (ENTRESTO ) 49-51 MG Take 1 tablet by mouth 2 (two) times daily. 60 tablet 5   Spacer/Aero-Holding Chambers (AEROCHAMBER PLUS WITH MASK) inhaler use as directed 1 each 0   spironolactone  (ALDACTONE ) 25 MG tablet Take 1 tablet (25 mg total) by mouth in the morning. 90 tablet 2   Blood Pressure Monitor MISC Use as directed to monitor blood pressure. (Patient not taking: Reported on 09/07/2023) 1 each 0   No current facility-administered medications for this encounter.   BP 100/60   Pulse 75   Wt 122.8 kg (270 lb 12.8 oz)   SpO2 98%   BMI 37.77 kg/m   Wt Readings from Last 3 Encounters:  09/07/23 122.8 kg (270 lb 12.8 oz)  03/08/23 125.6 kg (277 lb)  12/31/22 124.5 kg (274 lb 6.4 oz)    PHYSICAL EXAM: General: NAD Neck: No JVD, no thyromegaly or thyroid nodule.  Lungs: Clear to auscultation bilaterally with normal respiratory effort. CV: Nondisplaced PMI.  Heart regular S1/S2, no S3/S4, no murmur.  No peripheral edema.  No carotid bruit.  Normal pedal pulses.  Abdomen: Soft, nontender, no hepatosplenomegaly, no distention.  Skin: Intact without lesions or  rashes.  Neurologic: Alert and oriented x 3.  Psych: Normal affect. Extremities: No clubbing or cyanosis.  HEENT: Normal.   ASSESSMENT & PLAN: 1. Chronic systolic CHF: Ischemic cardiomyopathy. Echo in 6/19 with EF 25-30%, echo 9/19 with EF 30-35%.  Medtronic ICD.  Echo in 8/21 showed EF up to 45%.  Echo in 11/22 showed EF 45-50%. Echo in 11/23 showed  EF 45% with global hypokinesis, mild RV dysfunction, s/p MV repair with no MR and mild-moderate MS mean gradient 5 mmHg, IVC normal.  Echo 11/24 showed EF 35-40%, RV mildly reduced, mean gradient of 5.5 mmHg across mitral valve.  NYHA class II, symptomatically better.  Dyspnea likely multifactorial, COPD/asthma, paralyzed right hemidiaphragm and BMI likely all playing a role. He is not volume overloaded by exam or Optivol.  - Continue Entresto  49/51 mg bid, BP was too low on higher dose.  - Continue Lasix  40 mg 3x/week. BMET and BNP today. - Continue spironolactone  25 mg daily.   - Continue Coreg  18.75 mg bid, no BP room to increase.  - Continue empagliflozin  10 mg daily.  - Repeat echo in 11/25.  2. CAD: Stents in LCx and RCA. LHC 02/2017 showed nonobstructive disease. Cardiac PET in 1/24 showed EF 47%, minimal ischemia, low risk. Rare atypical chest pain.  - Continue atorvastatin  80 mg daily, check lipids. - He is on apixaban , so no ASA.  3. PE: In 2019, no definite trigger. Father with h/o VTE.  He should have long-term anticoagulation.   - Continue Eliquis .  4. Mitral regurgitation: Ischemic MR.  S/p minimally invasive MV repair in 6/19. Post-op echo 6/19 showed no MR but mean gradient 9 mmHg across MV. Echo 11/24 with no MR, mean gradient 5.5 mmHg across MV 5. 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 Contributory to his dyspnea.  - He is followed by Pulmonary. 6. Hyperlipidemia:  -  Continue atorvastatin  80 mg daily. Check lipids today.  - Continue Zetia  10 mg daily.   7. OSA: Using Bipap.  No change. 8.  Obesity: Body mass index is 37.77 kg/m. - Weight is trending down.  He wants to continue to lose weight on his own and is not interested in GLP-1 receptor agonist at this point.   Follow up 4 months with APP  I spent 32 minutes reviewing records, interviewing/examining patient, and managing orders.   Ezra Shuck  09/07/2023

## 2023-09-13 ENCOUNTER — Other Ambulatory Visit (HOSPITAL_COMMUNITY): Payer: Self-pay

## 2023-09-30 ENCOUNTER — Other Ambulatory Visit (HOSPITAL_COMMUNITY): Payer: Self-pay | Admitting: Cardiology

## 2023-10-01 ENCOUNTER — Other Ambulatory Visit (HOSPITAL_COMMUNITY): Payer: Self-pay

## 2023-10-01 ENCOUNTER — Other Ambulatory Visit: Payer: Self-pay

## 2023-10-01 MED ORDER — EMPAGLIFLOZIN 10 MG PO TABS
10.0000 mg | ORAL_TABLET | Freq: Every day | ORAL | 3 refills | Status: AC
  Filled 2023-10-01: qty 90, 90d supply, fill #0
  Filled 2024-01-10: qty 90, 90d supply, fill #1

## 2023-10-06 ENCOUNTER — Other Ambulatory Visit (HOSPITAL_COMMUNITY): Payer: Self-pay

## 2023-10-21 ENCOUNTER — Other Ambulatory Visit (HOSPITAL_COMMUNITY): Payer: Self-pay

## 2023-10-21 ENCOUNTER — Other Ambulatory Visit (HOSPITAL_COMMUNITY): Payer: Self-pay | Admitting: Cardiology

## 2023-10-21 MED ORDER — CARVEDILOL 12.5 MG PO TABS
18.7500 mg | ORAL_TABLET | Freq: Two times a day (BID) | ORAL | 3 refills | Status: AC
  Filled 2023-10-21: qty 180, 60d supply, fill #0
  Filled 2023-12-27: qty 180, 60d supply, fill #1

## 2023-10-26 NOTE — Progress Notes (Signed)
 Remote ICD Transmission

## 2023-11-04 ENCOUNTER — Ambulatory Visit: Payer: Medicare Other

## 2023-11-04 DIAGNOSIS — I5022 Chronic systolic (congestive) heart failure: Secondary | ICD-10-CM

## 2023-11-05 ENCOUNTER — Telehealth: Payer: Self-pay | Admitting: *Deleted

## 2023-11-05 NOTE — Telephone Encounter (Signed)
 ICD scheduled remote reviewed. Normal device function.  Presenting rhythm: VS 1 NSVT x 8 beats 1 High Rate-NS that appears TWOS. No hx of TWOS found in PA encounters. Routing to triage for awareness.  Some VF/FVT/VT therapies have 1 or more AX>B pathways programmed. AX>B pathway is not recommended for this device.  Next remote transmission per protocol. AB, CVRS  __________________________________________________________________________________________  Patient overdue for in clinic follow up  Last OV in 2022  Needs to come in for program adjustments to shock pathways and possible sensitivity adjust for TWOS  Message sent to EP scheduling to get patient an appointment  Routing to device pool to call patient Monday

## 2023-11-08 ENCOUNTER — Ambulatory Visit: Payer: Self-pay | Admitting: Cardiology

## 2023-11-08 LAB — CUP PACEART REMOTE DEVICE CHECK
Battery Remaining Longevity: 67 mo
Battery Voltage: 2.99 V
Brady Statistic RV Percent Paced: 0.05 %
Date Time Interrogation Session: 20251016012303
HighPow Impedance: 75 Ohm
Implantable Lead Connection Status: 753985
Implantable Lead Implant Date: 20191031
Implantable Lead Location: 753860
Implantable Lead Model: 6935
Implantable Pulse Generator Implant Date: 20191031
Lead Channel Impedance Value: 342 Ohm
Lead Channel Impedance Value: 418 Ohm
Lead Channel Pacing Threshold Amplitude: 0.625 V
Lead Channel Pacing Threshold Pulse Width: 0.4 ms
Lead Channel Sensing Intrinsic Amplitude: 10.75 mV
Lead Channel Sensing Intrinsic Amplitude: 10.75 mV
Lead Channel Setting Pacing Amplitude: 2 V
Lead Channel Setting Pacing Pulse Width: 0.4 ms
Lead Channel Setting Sensing Sensitivity: 0.45 mV
Zone Setting Status: 755011
Zone Setting Status: 755011

## 2023-11-08 NOTE — Telephone Encounter (Signed)
 Called and got patient scheduled for appointment with Daphne Barrack, NP Wednesday 11/10/23 at 0850  New address and location along with date and time of appointment provided and read back to this RN to confirm patient understanding   All questions and concerns addressed at this time   Patient appreciative of phone call  Routing to provider for awareness

## 2023-11-09 NOTE — Progress Notes (Unsigned)
  Electrophysiology Office Note:   Date:  11/10/2023  ID:  Joshua May, DOB December 27, 1975, MRN [id number]  Primary Cardiologist: Lynwood Schilling, MD Primary Heart Failure: None Electrophysiologist: Will Gladis Norton, MD       History of Present Illness:   Joshua May is a 48 y.o. male with h/o HFrEF / ICM s/p ICD, CAD, MV regurgitation s/p repair, HLD, PE, asthma, OSA,  seen today for routine electrophysiology followup.   Remote ICD review raised concern for possible TWOS and B>AX pathway.  Pt scheduled for device interrogation.   Since last being seen in our clinic the patient reports doing well. No device related concerns.  He does not work. Follows in [facility].     He denies chest pain, palpitations, dyspnea, PND, orthopnea, nausea, vomiting, dizziness, syncope, edema, weight gain, or early satiety.   Review of systems complete and found to be negative unless listed in HPI.    EP Information / Studies Reviewed:    EKG is not ordered today. EKG from 09/07/23 reviewed which showed NSR 72 bpm      ICD Interrogation-  reviewed in detail today,  See PACEART report.  Device History: Medtronic Single Chamber ICD implanted 10/37/19 for HFrEF / ICM  History of appropriate therapy: No History of AAD therapy: No   Risk Assessment/Calculations:              Physical Exam:   VS:  BP 100/68   Pulse 71   Ht 5' 11 (1.803 m)   Wt 270 lb 3.2 oz (122.6 kg)   SpO2 97%   BMI 37.69 kg/m    Wt Readings from Last 3 Encounters:  11/10/23 270 lb 3.2 oz (122.6 kg)  09/07/23 270 lb 12.8 oz (122.8 kg)  03/08/23 277 lb (125.6 kg)     GEN: Well nourished, well developed in no acute distress NECK: No JVD; No carotid bruits CARDIAC: Regular rate and rhythm, no murmurs, rubs, gallops RESPIRATORY:  Clear to auscultation without rales, wheezing or rhonchi  ABDOMEN: Soft, non-tender, non-distended EXTREMITIES:  No edema; No deformity   ASSESSMENT AND PLAN:    Chronic Systolic  Dysfunction due to ICM s/p Medtronic single chamber ICD  -euvolemic on exam / by device   -Stable on an appropriate medical regimen -Normal ICD function -See Pace Art report -No changes today -follows in [facility] Clinic  -TWOS on device > one episode with concern for TWOS, baseline sensitivity set at 0.45 mV, checked bipolar and Tip to Coil with no TWOS > left in Bipolar. Episode is one second long. B>AX programming addressed.  VHD: MV Regurgitation s/p Repair  -per Cardiology   CAD  HLD  -per Cardiology   Lupus Anticoagulant Disorder  -Eliquis  5mg  BID, appropriate dose by age / wt   OSA  -CPAP compliant   Former Smoker  -ongoing cessation encouraged   Disposition:   Follow up with Dr. Norton in 12 months   Signed, Daphne Barrack, NP-C, AGACNP-BC [facility] - Electrophysiology  11/10/2023, 12:26 PM

## 2023-11-10 ENCOUNTER — Ambulatory Visit: Payer: Self-pay | Admitting: Cardiology

## 2023-11-10 ENCOUNTER — Other Ambulatory Visit (HOSPITAL_COMMUNITY): Payer: Self-pay

## 2023-11-10 ENCOUNTER — Ambulatory Visit: Attending: Pulmonary Disease | Admitting: Pulmonary Disease

## 2023-11-10 ENCOUNTER — Encounter: Payer: Self-pay | Admitting: Pulmonary Disease

## 2023-11-10 VITALS — BP 100/68 | HR 71 | Ht 71.0 in | Wt 270.2 lb

## 2023-11-10 DIAGNOSIS — G4733 Obstructive sleep apnea (adult) (pediatric): Secondary | ICD-10-CM | POA: Insufficient documentation

## 2023-11-10 DIAGNOSIS — Z95810 Presence of automatic (implantable) cardiac defibrillator: Secondary | ICD-10-CM | POA: Insufficient documentation

## 2023-11-10 DIAGNOSIS — I255 Ischemic cardiomyopathy: Secondary | ICD-10-CM | POA: Diagnosis not present

## 2023-11-10 DIAGNOSIS — E782 Mixed hyperlipidemia: Secondary | ICD-10-CM | POA: Diagnosis present

## 2023-11-10 DIAGNOSIS — I2510 Atherosclerotic heart disease of native coronary artery without angina pectoris: Secondary | ICD-10-CM | POA: Diagnosis not present

## 2023-11-10 DIAGNOSIS — I5022 Chronic systolic (congestive) heart failure: Secondary | ICD-10-CM | POA: Diagnosis present

## 2023-11-10 LAB — CUP PACEART INCLINIC DEVICE CHECK
Date Time Interrogation Session: 20251022122659
Implantable Lead Connection Status: 753985
Implantable Lead Implant Date: 20191031
Implantable Lead Location: 753860
Implantable Lead Model: 6935
Implantable Pulse Generator Implant Date: 20191031

## 2023-11-10 NOTE — Progress Notes (Signed)
 Remote ICD Transmission

## 2023-11-10 NOTE — Patient Instructions (Signed)
 Medication Instructions:  Your physician recommends that you continue on your current medications as directed. Please refer to the Current Medication list given to you today.  *If you need a refill on your cardiac medications before your next appointment, please call your pharmacy*  Lab Work: None ordered If you have labs (blood work) drawn today and your tests are completely normal, you will receive your results only by: MyChart Message (if you have MyChart) OR A paper copy in the mail If you have any lab test that is abnormal or we need to change your treatment, we will call you to review the results.  Follow-Up: At [facility], you and your health needs are our priority.  As part of our continuing mission to provide you with exceptional heart care, our providers are all part of one team.  This team includes your primary Cardiologist (physician) and Advanced Practice Providers or APPs (Physician Assistants and Nurse Practitioners) who all work together to provide you with the care you need, when you need it.  Your next appointment:   1 year(s)  Provider:   You may see Will Gladis Norton, MD or one of the following Advanced Practice Providers on your designated Care Team:   Charlies Arthur, [place] Ozell Jodie Passey, PA-C Suzann Riddle, NP Daphne Barrack, NP Artist Pouch, PA-C

## 2023-12-02 ENCOUNTER — Telehealth: Payer: Self-pay

## 2023-12-08 ENCOUNTER — Ambulatory Visit (HOSPITAL_COMMUNITY)
Admission: RE | Admit: 2023-12-08 | Discharge: 2023-12-08 | Disposition: A | Discharge status: Home / Self Care | Source: Ambulatory Visit | Attending: Cardiology | Admitting: Cardiology

## 2023-12-08 DIAGNOSIS — I2510 Atherosclerotic heart disease of native coronary artery without angina pectoris: Secondary | ICD-10-CM | POA: Insufficient documentation

## 2023-12-08 DIAGNOSIS — I5022 Chronic systolic (congestive) heart failure: Secondary | ICD-10-CM | POA: Diagnosis present

## 2023-12-08 DIAGNOSIS — I342 Nonrheumatic mitral (valve) stenosis: Secondary | ICD-10-CM | POA: Insufficient documentation

## 2023-12-08 LAB — ECHOCARDIOGRAM COMPLETE
Area-P 1/2: 2.21 cm2
Calc EF: 29.6 %
S' Lateral: 4.4 cm
Single Plane A2C EF: 29.4 %
Single Plane A4C EF: 27.5 %

## 2023-12-08 MED ORDER — PERFLUTREN LIPID MICROSPHERE
1.0000 mL | INTRAVENOUS | Status: AC | PRN
  Administered 2023-12-08: 2 mL via INTRAVENOUS

## 2023-12-09 ENCOUNTER — Ambulatory Visit: Admitting: Cardiology

## 2023-12-09 ENCOUNTER — Ambulatory Visit: Attending: Cardiology | Admitting: Cardiology

## 2023-12-09 ENCOUNTER — Other Ambulatory Visit (HOSPITAL_COMMUNITY): Payer: Self-pay

## 2023-12-09 ENCOUNTER — Telehealth (HOSPITAL_COMMUNITY): Payer: Self-pay

## 2023-12-09 ENCOUNTER — Encounter: Payer: Self-pay | Admitting: Cardiology

## 2023-12-09 VITALS — BP 84/62 | HR 67 | Ht 71.0 in | Wt 270.2 lb

## 2023-12-09 DIAGNOSIS — G4733 Obstructive sleep apnea (adult) (pediatric): Secondary | ICD-10-CM | POA: Diagnosis present

## 2023-12-09 DIAGNOSIS — I951 Orthostatic hypotension: Secondary | ICD-10-CM | POA: Diagnosis not present

## 2023-12-09 MED ORDER — SACUBITRIL-VALSARTAN 24-26 MG PO TABS
1.0000 | ORAL_TABLET | Freq: Two times a day (BID) | ORAL | 11 refills | Status: AC
  Filled 2023-12-09: qty 60, 30d supply, fill #0
  Filled 2024-01-10: qty 60, 30d supply, fill #1
  Filled 2024-02-10: qty 60, 30d supply, fill #2

## 2023-12-09 NOTE — Progress Notes (Signed)
 Date:  12/09/2023   ID:  RICKE KIMOTO, DOB 13-Aug-1975, MRN [id number] T PCP:  Prentiss Dorothyann Maxwell, MD (Inactive)   [facility] Providers Cardiologist:  Lynwood Schilling, MD Electrophysiologist:  Will Gladis Norton, MD     Evaluation Performed:  Follow-Up Visit  Chief Complaint:  OSA  History of Present Illness:    Joshua May is a 48 y.o. male with with a history of CAD, chronic systolic CHF, hyperlipidemia, ischemic cardiomyopathy, pulmonary embolism and mitral valve repair.   He was referred for home sleep study by Dr. Ezra Shuck for CHF.  This showed severe obstructive sleep apnea with an AHI of 68.6/h and moderate central sleep apnea with an pAHIc of 19.1/h with 4.8% of the time noted to be in Cheyne-Stokes respirations.  He had nocturnal hypoxemia and spent 215 minutes with O2 saturations less than 88%.  The patient underwent CPAP titration but due to ongoing respiratory events he was changed to BiPAP and eventually an auto BiPAP with IPAP max 20 cm H2O, EPAP min 5 cm H2O and pressure support 4 cm H2O was ordered.   He is doing well with his PAP device and thinks that he has gotten used to it.  He tolerates the full face mask and feels the pressure is adequate.  Since going on PAP his feels rested in the am and has no significant daytime sleepiness.  He occasionally has some nasal dryness.  An Epworth Sleepiness Scale score was calculated the office today and this endorsed at 14 arguing for some residual daytime sleepiness. Patient denies any episodes of bruxism, restless legs, No gagging hallucinations or cataplectic events.    Past Medical History:  Diagnosis Date   Asthma    CAD (coronary artery disease)    Chronic systolic CHF (congestive heart failure) (HCC)    HLD (hyperlipidemia)    Qualifier: Diagnosis of  By: Schilling, MD, CODY Lynwood     Hypercholesteremia    Inferior myocardial infarction [facility]) 2007   PCI of RCA   Inferior myocardial infarction (HCC)  03/31/2008   PCI of RCA with bare metal stent   Ischemic cardiomyopathy    Migraines    and dizziness   Mitral regurgitation    Obstructive sleep apnea    Qualifier: Diagnosis of  By: Schilling, MD, FACC, James     Posterolateral myocardial infarction (HCC) 10/20/2015   PCI of LCx using DES   Pulmonary embolism (HCC) 02/28/2017   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   Carpentier-McCarthy Adams ring annuloplasty,   size 26     Tobacco abuse    Past Surgical History:  Procedure Laterality Date   CARDIAC CATHETERIZATION N/A 10/20/2015   Procedure: Left Heart Cath and Coronary Angiography;  Surgeon: Lonni JONETTA Cash, MD;  Location: [facility] INVASIVE CV LAB;  Service: Cardiovascular;  Laterality: N/A;   CARDIAC CATHETERIZATION N/A 10/20/2015   Procedure: Coronary Stent Intervention;  Surgeon: Lonni JONETTA Cash, MD;  Location: [facility] INVASIVE CV LAB;  Service: Cardiovascular;  Laterality: N/A;   CORONARY PRESSURE/FFR STUDY N/A 03/03/2017   Procedure: INTRAVASCULAR PRESSURE WIRE/FFR STUDY;  Surgeon: Anner Alm ORN, MD;  Location: [facility] INVASIVE CV LAB;  Service: Cardiovascular;  Laterality: N/A;   ICD IMPLANT N/A 11/18/2017   Procedure: ICD IMPLANT;  Surgeon: Norton Soyla Gladis, MD;  Location: [facility] INVASIVE CV LAB;  Service: Cardiovascular;  Laterality: N/A;   LEFT HEART CATH AND CORONARY ANGIOGRAPHY N/A 03/03/2017   Procedure: LEFT HEART CATH AND CORONARY ANGIOGRAPHY;  Surgeon: Anner,  Alm ORN, MD;  Location: [facility] INVASIVE CV LAB;  Service: Cardiovascular;  Laterality: N/A;   MITRAL VALVE REPAIR Right 07/07/2017   Procedure: MINIMALLY INVASIVE MITRAL VALVE REPAIR (MVR) using Carpentier-McCarthy Adams Ring size 26;  Surgeon: Dusty Sudie DEL, MD;  Location: [facility] OR;  Service: Open Heart Surgery;  Laterality: Right;   MULTIPLE EXTRACTIONS WITH ALVEOLOPLASTY N/A 05/13/2017   Procedure: Extraction of tooth #'s [phone] with alveoloplasty and gross debridement of remaining teeth.;  Surgeon: Cyndee Tanda FALCON, DDS;  Location: [facility] OR;  Service: Oral Surgery;  Laterality: N/A;   none     OTHER SURGICAL HISTORY     NONE   PATENT FORAMEN OVALE(PFO) CLOSURE N/A 07/07/2017   Procedure: PATENT FORAMEN OVALE (PFO) CLOSURE;  Surgeon: Dusty Sudie DEL, MD;  Location: [facility] OR;  Service: Open Heart Surgery;  Laterality: N/A;   RIGHT HEART CATH N/A 09/22/2017   Procedure: RIGHT HEART CATH;  Surgeon: Rolan Ezra RAMAN, MD;  Location: [facility] INVASIVE CV LAB;  Service: Cardiovascular;  Laterality: N/A;   TEE WITHOUT CARDIOVERSION N/A 04/19/2017   Procedure: TRANSESOPHAGEAL ECHOCARDIOGRAM (TEE);  Surgeon: Rolan Ezra RAMAN, MD;  Location: [facility] ENDOSCOPY;  Service: Cardiovascular;  Laterality: N/A;   TEE WITHOUT CARDIOVERSION N/A 07/07/2017   Procedure: TRANSESOPHAGEAL ECHOCARDIOGRAM (TEE);  Surgeon: Dusty Sudie DEL, MD;  Location: [facility] OR;  Service: Open Heart Surgery;  Laterality: N/A;     Current Meds  Medication Sig   albuterol  (VENTOLIN  HFA) 108 (90 Base) MCG/ACT inhaler Inhale 2 puffs into the lungs every 6 (six) hours as needed for wheezing or shortness of breath. Use this with a spacer to get Pehrson effect when needed.   apixaban  (ELIQUIS ) 5 MG TABS tablet Take 1 tablet (5 mg total) by mouth 2 (two) times daily.   atorvastatin  (LIPITOR ) 80 MG tablet Take 1 tablet (80 mg total) by mouth daily.   Blood Pressure Monitor MISC Use as directed to monitor blood pressure.   Budeson-Glycopyrrol-Formoterol  (BREZTRI  AEROSPHERE) 160-9-4.8 MCG/ACT AERO Inhale 2 puffs into the lungs in the morning and at bedtime.   carvedilol  (COREG ) 12.5 MG tablet Take 1 & 1/2 tablets (18.75 mg total) by mouth 2 (two) times daily.   empagliflozin  (JARDIANCE ) 10 MG TABS tablet Take 1 tablet (10 mg total) by mouth daily before breakfast.   ezetimibe  (ZETIA ) 10 MG tablet Take 1 tablet (10 mg total) by mouth daily.   fenofibrate  (TRICOR ) 145 MG tablet Take 1 tablet (145 mg total) by mouth daily.   ferrous sulfate  325 (65 FE) MG tablet Take 325 mg by  mouth daily.   folic acid  (FOLVITE ) 1 MG tablet Take 1 mg by mouth daily.   furosemide  (LASIX ) 40 MG tablet Take 1 tablet (40 mg total) by mouth as directed every Monday, Wednesday, and Friday   potassium chloride  SA (KLOR-CON  M) 20 MEQ tablet Take 1 tablet (20 mEq total) by mouth daily.   sacubitril -valsartan  (ENTRESTO ) 49-51 MG Take 1 tablet by mouth 2 (two) times daily.   Spacer/Aero-Holding Chambers (AEROCHAMBER PLUS WITH MASK) inhaler use as directed   spironolactone  (ALDACTONE ) 25 MG tablet Take 1 tablet (25 mg total) by mouth in the morning.     Allergies:   Shellfish allergy   Social History   Tobacco Use   Smoking status: Former    Current packs/day: 0.50    Average packs/day: 0.5 packs/day for 17.0 years (8.5 ttl pk-yrs)    Types: Cigarettes   Smokeless tobacco: Never   Tobacco comments:  quit since heart attack in 10/2015  Vaping Use   Vaping status: Never Used  Substance Use Topics   Alcohol use: Not Currently   Drug use: Not Currently    Types: Marijuana     Family Hx: The patient's family history includes Deep vein thrombosis in his father; Heart attack in his father; Heart attack (age of onset: 52) in his sister.  ROS:   Please see the history of present illness.     All other systems reviewed and are negative.   Prior CV studies:   The following studies were reviewed today:  Home sleep study, CPAP titration, PAP download  Labs/Other Tests and Data Reviewed:    EKG:  No ECG reviewed.  Recent Labs: 03/08/2023: Hemoglobin 14.2; Platelets 211 09/07/2023: B Natriuretic Peptide 42.9; BUN 16; Creatinine, Ser 0.99; Potassium 4.4; Sodium 136   Recent Lipid Panel Lab Results  Component Value Date/Time   CHOL 102 09/07/2023 10:22 AM   CHOL 136 04/24/2020 11:51 AM   TRIG 159 (H) 09/07/2023 10:22 AM   HDL 23 (L) 09/07/2023 10:22 AM   HDL 25 (L) 04/24/2020 11:51 AM   CHOLHDL 4.4 09/07/2023 10:22 AM   LDLCALC 47 09/07/2023 10:22 AM   LDLCALC 89 04/24/2020  11:51 AM   LDLDIRECT 149.8 11/15/2009 09:33 AM    Wt Readings from Last 3 Encounters:  12/09/23 270 lb 3.2 oz (122.6 kg)  11/10/23 270 lb 3.2 oz (122.6 kg)  09/07/23 270 lb 12.8 oz (122.8 kg)     Risk Assessment/Calculations:          Objective:    Vital Signs:  BP (!) 84/62   Pulse 67   Ht 5' 11 (1.803 m)   Wt 270 lb 3.2 oz (122.6 kg)   SpO2 98%   BMI 37.69 kg/m   GEN: Well nourished, well developed in no acute distress HEENT: Normal NECK: No JVD; No carotid bruits LYMPHATICS: No lymphadenopathy CARDIAC:RRR, no murmurs, rubs, gallops RESPIRATORY:  Clear to auscultation without rales, wheezing or rhonchi  ABDOMEN: Soft, non-tender, non-distended MUSCULOSKELETAL:  No edema; No deformity  SKIN: Warm and dry NEUROLOGIC:  Alert and oriented x 3 PSYCHIATRIC:  Normal affect   ASSESSMENT & PLAN:    OSA - The patient is tolerating PAP therapy well without any problems. The PAP download performed by his DME was personally reviewed and interpreted by me today and showed an AHI of 2.5/hr on auto BIPAP with IPAP max 20cm H2O, EPAP min 2.5cm H2O and PS 4 cm H2O with 100% compliance in using more than 4 hours nightly.  The patient has been using and benefiting from PAP use and will continue to benefit from therapy.  -I encouraged him to get a humidifier for his room so his device does not run out of H2O -also encouraged him to use nasal saline spray 2 sprays each nostril BID along with adjusting humidity on device    Hypotension - BP 84/62 mm today which he says has been occurring recently - I sent a note to Dr. Rolan about his blood pressure and for him to contact patient about further instructions on medication therapy for his chronic systolic CHF   Medication Adjustments/Labs and Tests Ordered: Current medicines are reviewed at length with the patient today.  Concerns regarding medicines are outlined above.   Tests Ordered: No orders of the defined types were placed in this  encounter.   Medication Changes: No orders of the defined types were placed in this encounter.  Follow Up:  Followup with me in 1 year  Signed, Wilbert Bihari, MD  12/09/2023 10:27 AM    [facility]

## 2023-12-09 NOTE — Telephone Encounter (Signed)
 Per Dr. Rolan , hold next dose of Entresto  and cut it down to 24/26 bid. Patient aware and agreeable. Also instructed patient that if his symptoms persist to call the office and let us  know. Med list updated

## 2023-12-09 NOTE — Patient Instructions (Signed)

## 2023-12-29 ENCOUNTER — Other Ambulatory Visit (HOSPITAL_COMMUNITY): Payer: Self-pay

## 2024-01-05 NOTE — Progress Notes (Incomplete)
 PCP: Prentiss Dorothyann Maxwell, MD (Inactive) HF Cardiology: Dr Rolan   Chief complaint: CHF  HPI: Joshua May is a 48 y.o. male AA male with a history of CAD dating back to 2007 when he had a PCI in [place]. In 2010 he presented with a STEMI and had an RCA DES placed. He followed up for a year but then didn't present again till Oct 2017 when he presented with a CX infarct. He had CX PCI with DES and OM3 POBA. His EF then was 50-55%.  Admitted 02/27/17 with increased dyspnea and chest pain. CTA confirmed PE. ECHO showed EF 20-25%. LHC as noted below.   S/P Minimally invasive mitral valve repair on 6/19. ECHO 07/12/17 post surgery, no MR was noted but EF remained 25-30%.   CPX was submaximal in 8/19, suggestive of marked deconditioning.  RHC in 9/19 showed relatively preserved cardiac index with only mildly elevated filling pressures.  Echo in 9/19 showed EF 30-35%. MDT ICD placed in 2019.   Echo 8/21 EF 45%, RV normal, s/p MV repair with mean gradient 5 and MVA 1.78 cm^2 (mild mitral stenosis), trivial MR.   Echo in 11/22: EF 45-50%, s/p MV repair with no MR, moderate mitral stenosis with mean gradient 7 mmHg and MVA 1.12 by VTI, IVC normal, RV normal.   Echo 11/23 EF 45% with global hypokinesis, mild RV dysfunction, s/p MV repair with no MR and mild-moderate MS mean gradient 5 mmHg, IVC normal.   Cardiac PET (1/24) EF 47%, old inferolateral MI, small area of ischemia at the apex. Overall low risk with minimal ischemia and no indication for cath.  Echo 11/24 EF 35-40%, RV mildly reduced, mean gradient of 5.5 mmHg across mitral valve.  He returns today for followup of CHF.  He has shortness of breath walking up hills but does fine on flat ground.  Weight down 7 lbs with diet and exercise.  Rare atypical chest pain (feels twinges around his ICD).  He has been doing more walking than in the past.  He is limited by low back pain and knee pain.  He is using Bipap at night.  No lightheadedness or  palpitations.   ECG (personally reviewed): NSR, poor RWP  MDT device interrogation (personally reviewed): Fluid index < threshold, no AF/VT.   PMH: 1. CAD: Initial PCI in 2007 in [place], inferior MI.   - Inferior STEMI 2010 with RCA PCI.  - CFx infarct in 2017 with LCx DES and OM3 POBA.  - LHC (2/19): Nonobstructive CAD.  - Cardiac PET (1/24): EF 47%, old inferolateral MI, small area of ischemia at apex, overall low risk with minimal ischemia. 2. 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  - TEE (4/19): LVEF 35-40%, Severe MR with PISA ERO 0.43 cm^2. Suspect infarct related MR with tethering of the posterior leaflet.  - Minimally invasive MV repair in 6/19.   3. Chronic systolic CHF: Ischemic cardiomyopathy. Medtronic ICD.  - Echo (2/19): EF 25-30%, diffuse HK with inferolateral AK, mildly decreased RV systolic function with D-shaped septum, moderate-severe infarct-related MR.  - Echo (6/19): EF 25-30%, apical and inferolateral hypokinesis, MV repair with mean gradient 9 mmHg with no MR, mildly decreased RV systolic function.  - CPX (8/19): VO2 13.2, VE/VCO2 slope 28, RER 0.87.  This study was suggestive of severe deconditioning.  - RHC (9/19): mean RA 9, PA 39/23 mean 24, mean PCWP 17, CI 2.2, PVR 1.35 WU - Echo (9/19): EF 30-35%, diffuse hypokinesis, normal RV, s/p MV repair with mean  gradient 6 mmHg and PHT 104 msec => probably no significant stenosis.  - Echo (8/21): EF 45%, RV normal, s/p MV repair with mean gradient 5 and MVA 1.78 cm^2 (mild mitral stenosis), trivial MR.  - Echo (11/22): EF 45-50%, s/p MV repair with no MR, moderate mitral stenosis with mean gradient 7 mmHg and MVA 1.12 by VTI, IVC normal, RV normal.  - Echo (11/23): EF 45% with global hypokinesis, mild RV dysfunction, s/p MV repair with no MR and mild-moderate MS mean gradient 5 mmHg, IVC normal.  - Echo 11/24: EF 35-40%, RV mildly reduced, mean gradient of 5.5 mmhg across mitral valve 4. Hyperlipidemia 5. PE: 2/19.  6.  COPD: Moderate COPD on 6/19 PFTs.  7. OSA: Uses Bipap  Review of systems complete and found to be negative unless listed in HPI.    Social History   Socioeconomic History   Marital status: Married    Spouse name: Not on file   Number of children: Not on file   Years of education: Not on file   Highest education level: Not on file  Occupational History   Not on file  Tobacco Use   Smoking status: Former    Current packs/day: 0.50    Average packs/day: 0.5 packs/day for 17.0 years (8.5 ttl pk-yrs)    Types: Cigarettes   Smokeless tobacco: Never   Tobacco comments:    quit since heart attack in 10/2015  Vaping Use   Vaping status: Never Used  Substance and Sexual Activity   Alcohol use: Not Currently   Drug use: Not Currently    Types: Marijuana   Sexual activity: Not on file  Other Topics Concern   Not on file  Social History Narrative   The patient is married with two children.   Social Drivers of Health   Tobacco Use: Medium Risk (12/09/2023)   Patient History    Smoking Tobacco Use: Former    Smokeless Tobacco Use: Never    Passive Exposure: Not on Actuary Strain: Not on file  Food Insecurity: Not on file  Transportation Needs: Not on file  Physical Activity: Not on file  Stress: Not on file  Social Connections: Not on file  Intimate Partner Violence: Not on file  Depression (PHQ2-9): Not on file  Alcohol Screen: Not on file  Housing: Not on file  Utilities: Not on file  Health Literacy: Not on file   Family History  Problem Relation Age of Onset   Heart attack Sister 39       S/P CABG   Heart attack Father    Deep vein thrombosis Father     Current Outpatient Medications  Medication Sig Dispense Refill   albuterol  (VENTOLIN  HFA) 108 (90 Base) MCG/ACT inhaler Inhale 2 puffs into the lungs every 6 (six) hours as needed for wheezing or shortness of breath. Use this with a spacer to get Prude effect when needed. 18 g 3   apixaban   (ELIQUIS ) 5 MG TABS tablet Take 1 tablet (5 mg total) by mouth 2 (two) times daily. 180 tablet 3   atorvastatin  (LIPITOR ) 80 MG tablet Take 1 tablet (80 mg total) by mouth daily. 90 tablet 3   Blood Pressure Monitor MISC Use as directed to monitor blood pressure. 1 each 0   Budeson-Glycopyrrol-Formoterol  (BREZTRI  AEROSPHERE) 160-9-4.8 MCG/ACT AERO Inhale 2 puffs into the lungs in the morning and at bedtime. 10.7 g 4   carvedilol  (COREG ) 12.5 MG tablet Take 1 & 1/2 tablets (  18.75 mg total) by mouth 2 (two) times daily. 180 tablet 3   empagliflozin  (JARDIANCE ) 10 MG TABS tablet Take 1 tablet (10 mg total) by mouth daily before breakfast. 90 tablet 3   ezetimibe  (ZETIA ) 10 MG tablet Take 1 tablet (10 mg total) by mouth daily. 30 tablet 11   fenofibrate  (TRICOR ) 145 MG tablet Take 1 tablet (145 mg total) by mouth daily. 90 tablet 3   ferrous sulfate  325 (65 FE) MG tablet Take 325 mg by mouth daily.     folic acid  (FOLVITE ) 1 MG tablet Take 1 mg by mouth daily.     furosemide  (LASIX ) 40 MG tablet Take 1 tablet (40 mg total) by mouth as directed every Monday, Wednesday, and Friday 60 tablet 3   potassium chloride  SA (KLOR-CON  M) 20 MEQ tablet Take 1 tablet (20 mEq total) by mouth daily. 60 tablet 11   sacubitril -valsartan  (ENTRESTO ) 24-26 MG Take 1 tablet by mouth 2 (two) times daily. 60 tablet 11   Spacer/Aero-Holding Chambers (AEROCHAMBER PLUS WITH MASK) inhaler use as directed 1 each 0   spironolactone  (ALDACTONE ) 25 MG tablet Take 1 tablet (25 mg total) by mouth in the morning. 90 tablet 2   No current facility-administered medications for this visit.   There were no vitals taken for this visit.  Wt Readings from Last 3 Encounters:  12/09/23 122.6 kg (270 lb 3.2 oz)  11/10/23 122.6 kg (270 lb 3.2 oz)  09/07/23 122.8 kg (270 lb 12.8 oz)    PHYSICAL EXAM: General: NAD Neck: No JVD, no thyromegaly or thyroid nodule.  Lungs: Clear to auscultation bilaterally with normal respiratory effort. CV:  Nondisplaced PMI.  Heart regular S1/S2, no S3/S4, no murmur.  No peripheral edema.  No carotid bruit.  Normal pedal pulses.  Abdomen: Soft, nontender, no hepatosplenomegaly, no distention.  Skin: Intact without lesions or rashes.  Neurologic: Alert and oriented x 3.  Psych: Normal affect. Extremities: No clubbing or cyanosis.  HEENT: Normal.   ASSESSMENT & PLAN: 1. Chronic systolic CHF: Ischemic cardiomyopathy. Echo in 6/19 with EF 25-30%, echo 9/19 with EF 30-35%.  Medtronic ICD.  Echo in 8/21 showed EF up to 45%.  Echo in 11/22 showed EF 45-50%. Echo in 11/23 showed  EF 45% with global hypokinesis, mild RV dysfunction, s/p MV repair with no MR and mild-moderate MS mean gradient 5 mmHg, IVC normal.  Echo 11/24 showed EF 35-40%, RV mildly reduced, mean gradient of 5.5 mmHg across mitral valve.  NYHA class II, symptomatically better.  Dyspnea likely multifactorial, COPD/asthma, paralyzed right hemidiaphragm and BMI likely all playing a role. He is not volume overloaded by exam or Optivol.  - Continue Entresto  49/51 mg bid, BP was too low on higher dose.  - Continue Lasix  40 mg 3x/week. BMET and BNP today. - Continue spironolactone  25 mg daily.   - Continue Coreg  18.75 mg bid, no BP room to increase.  - Continue empagliflozin  10 mg daily.  - Repeat echo in 11/25.  2. CAD: Stents in LCx and RCA. LHC 02/2017 showed nonobstructive disease. Cardiac PET in 1/24 showed EF 47%, minimal ischemia, low risk. Rare atypical chest pain.  - Continue atorvastatin  80 mg daily, check lipids. - He is on apixaban , so no ASA.  3. PE: In 2019, no definite trigger. Father with h/o VTE.  He should have long-term anticoagulation.   - Continue Eliquis .  4. Mitral regurgitation: Ischemic MR.  S/p minimally invasive MV repair in 6/19. Post-op echo 6/19 showed no MR  but mean gradient 9 mmHg across MV. Echo 11/24 with no MR, mean gradient 5.5 mmHg across MV 5. 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 Contributory to his dyspnea.  - He is followed by Pulmonary. 6. Hyperlipidemia:  - Continue atorvastatin  80 mg daily. Check lipids today.  - Continue Zetia  10 mg daily.   7. OSA: Using Bipap.  No change. 8. Obesity: There is no height or weight on file to calculate BMI. - Weight is trending down.  He wants to continue to lose weight on his own and is not interested in GLP-1 receptor agonist at this point.   Follow up 4 months with APP  I spent 32 minutes reviewing records, interviewing/examining patient, and managing orders.   Harlene HERO [facility]  01/05/2024

## 2024-01-06 ENCOUNTER — Telehealth (HOSPITAL_COMMUNITY): Payer: Self-pay

## 2024-01-06 NOTE — Telephone Encounter (Signed)
 Called to confirm/remind patient of their appointment at the [facility] on 01/07/24.   Appointment:   [x] Confirmed  [] Left mess   [] No answer/No voice mail  [] VM Full/unable to leave message  [] Phone not in service  Patient reminded to bring all medications and/or complete list.  Confirmed patient has transportation. Gave directions, instructed to utilize valet parking.

## 2024-01-07 ENCOUNTER — Encounter (HOSPITAL_COMMUNITY): Payer: Self-pay

## 2024-01-07 ENCOUNTER — Ambulatory Visit (HOSPITAL_COMMUNITY): Payer: Self-pay | Admitting: Family Medicine

## 2024-01-07 ENCOUNTER — Ambulatory Visit (HOSPITAL_COMMUNITY)
Admission: RE | Admit: 2024-01-07 | Discharge: 2024-01-07 | Disposition: A | Discharge status: Home / Self Care | Source: Ambulatory Visit | Attending: Family Medicine | Admitting: Family Medicine

## 2024-01-07 VITALS — BP 114/70 | HR 71 | Ht 71.0 in | Wt 279.2 lb

## 2024-01-07 DIAGNOSIS — Z87891 Personal history of nicotine dependence: Secondary | ICD-10-CM | POA: Insufficient documentation

## 2024-01-07 DIAGNOSIS — I2510 Atherosclerotic heart disease of native coronary artery without angina pectoris: Secondary | ICD-10-CM | POA: Insufficient documentation

## 2024-01-07 DIAGNOSIS — Z86711 Personal history of pulmonary embolism: Secondary | ICD-10-CM

## 2024-01-07 DIAGNOSIS — Z7984 Long term (current) use of oral hypoglycemic drugs: Secondary | ICD-10-CM | POA: Insufficient documentation

## 2024-01-07 DIAGNOSIS — J449 Chronic obstructive pulmonary disease, unspecified: Secondary | ICD-10-CM

## 2024-01-07 DIAGNOSIS — Z95810 Presence of automatic (implantable) cardiac defibrillator: Secondary | ICD-10-CM | POA: Insufficient documentation

## 2024-01-07 DIAGNOSIS — Z79899 Other long term (current) drug therapy: Secondary | ICD-10-CM | POA: Diagnosis not present

## 2024-01-07 DIAGNOSIS — E785 Hyperlipidemia, unspecified: Secondary | ICD-10-CM | POA: Diagnosis not present

## 2024-01-07 DIAGNOSIS — E782 Mixed hyperlipidemia: Secondary | ICD-10-CM

## 2024-01-07 DIAGNOSIS — Z955 Presence of coronary angioplasty implant and graft: Secondary | ICD-10-CM | POA: Diagnosis not present

## 2024-01-07 DIAGNOSIS — E669 Obesity, unspecified: Secondary | ICD-10-CM

## 2024-01-07 DIAGNOSIS — I252 Old myocardial infarction: Secondary | ICD-10-CM | POA: Diagnosis not present

## 2024-01-07 DIAGNOSIS — I5022 Chronic systolic (congestive) heart failure: Secondary | ICD-10-CM | POA: Diagnosis not present

## 2024-01-07 DIAGNOSIS — I340 Nonrheumatic mitral (valve) insufficiency: Secondary | ICD-10-CM | POA: Diagnosis not present

## 2024-01-07 DIAGNOSIS — Z7901 Long term (current) use of anticoagulants: Secondary | ICD-10-CM | POA: Insufficient documentation

## 2024-01-07 DIAGNOSIS — I255 Ischemic cardiomyopathy: Secondary | ICD-10-CM | POA: Insufficient documentation

## 2024-01-07 DIAGNOSIS — Z8249 Family history of ischemic heart disease and other diseases of the circulatory system: Secondary | ICD-10-CM | POA: Insufficient documentation

## 2024-01-07 LAB — BASIC METABOLIC PANEL WITH GFR
Anion gap: 12 (ref 5–15)
BUN: 19 mg/dL (ref 6–20)
CO2: 20 mmol/L — ABNORMAL LOW (ref 22–32)
Calcium: 8.8 mg/dL — ABNORMAL LOW (ref 8.9–10.3)
Chloride: 108 mmol/L (ref 98–111)
Creatinine, Ser: 0.98 mg/dL (ref 0.61–1.24)
GFR, Estimated: >60 mL/min
Glucose, Bld: 109 mg/dL — ABNORMAL HIGH (ref 70–99)
Potassium: 4.1 mmol/L (ref 3.5–5.1)
Sodium: 139 mmol/L (ref 135–145)

## 2024-01-07 LAB — PRO BRAIN NATRIURETIC PEPTIDE: Pro Brain Natriuretic Peptide: 78 pg/mL

## 2024-01-07 NOTE — Patient Instructions (Signed)
 CHANGE Lasix  to 40 mg daily for the next 7 days. Then on 12/17 go back to Monday, Wednesday and Friday.  Labs done today, your results will be available in MyChart, we will contact you for abnormal readings.  Your physician recommends that you schedule a follow-up appointment in: 4 months ( April 2026) ** PLEASE CALL THE OFFICE IN FEBRUARY TO ARRANGE YOUR FOLLOW UP APPOINTMENT.**  If you have any questions or concerns before your next appointment please send us  a message through [place] or call our office at [phone].    TO LEAVE A MESSAGE FOR THE NURSE SELECT OPTION 2, PLEASE LEAVE A MESSAGE INCLUDING: YOUR NAME DATE OF BIRTH CALL BACK NUMBER REASON FOR CALL**this is important as we prioritize the call backs  YOU WILL RECEIVE A CALL BACK THE SAME DAY AS LONG AS YOU CALL BEFORE 4:00 PM  At the [facility], you and your health needs are our priority. As part of our continuing mission to provide you with exceptional heart care, we have created designated Provider Care Teams. These Care Teams include your primary Cardiologist (physician) and Advanced Practice Providers (APPs- Physician Assistants and Nurse Practitioners) who all work together to provide you with the care you need, when you need it.   You may see any of the following providers on your designated Care Team at your next follow up: Dr Toribio Fuel Dr Ezra Shuck Dr. Morene Brownie Greig Mosses, NP Caffie Shed, [place] [facility] [place], [place] Beckey Coe, NP Jordan Lee, NP Ellouise Class, NP Tinnie Redman, PharmD Jaun Bash, PharmD   Please be sure to bring in all your medications bottles to every appointment.    Thank you for choosing [facility]

## 2024-01-18 ENCOUNTER — Other Ambulatory Visit: Payer: Self-pay

## 2024-01-18 ENCOUNTER — Other Ambulatory Visit (HOSPITAL_COMMUNITY): Payer: Self-pay

## 2024-02-03 ENCOUNTER — Ambulatory Visit: Payer: Medicare (Managed Care) | Attending: Cardiology

## 2024-02-03 DIAGNOSIS — I5022 Chronic systolic (congestive) heart failure: Secondary | ICD-10-CM

## 2024-02-03 LAB — CUP PACEART REMOTE DEVICE CHECK
Battery Remaining Longevity: 62 mo
Battery Voltage: 2.99 V
Brady Statistic RV Percent Paced: 0.01 %
Date Time Interrogation Session: 20260115012505
HighPow Impedance: 77 Ohm
Implantable Lead Connection Status: 753985
Implantable Lead Implant Date: 20191031
Implantable Lead Location: 753860
Implantable Lead Model: 6935
Implantable Pulse Generator Implant Date: 20191031
Lead Channel Impedance Value: 361 Ohm
Lead Channel Impedance Value: 418 Ohm
Lead Channel Pacing Threshold Amplitude: 0.625 V
Lead Channel Pacing Threshold Pulse Width: 0.4 ms
Lead Channel Sensing Intrinsic Amplitude: 10.25 mV
Lead Channel Sensing Intrinsic Amplitude: 10.25 mV
Lead Channel Setting Pacing Amplitude: 2 V
Lead Channel Setting Pacing Pulse Width: 0.4 ms
Lead Channel Setting Sensing Sensitivity: 0.45 mV
Zone Setting Status: 755011
Zone Setting Status: 755011

## 2024-02-04 ENCOUNTER — Ambulatory Visit: Payer: Self-pay | Admitting: Cardiology

## 2024-02-10 ENCOUNTER — Other Ambulatory Visit (HOSPITAL_COMMUNITY): Payer: Self-pay

## 2024-02-10 NOTE — Progress Notes (Signed)
 Remote ICD Transmission

## 2024-02-25 ENCOUNTER — Telehealth (HOSPITAL_COMMUNITY): Payer: Self-pay | Admitting: Licensed Clinical Social Worker

## 2024-02-25 NOTE — Telephone Encounter (Signed)
 H&V Care Navigation CSW Progress Note  Pt requested assistance with getting physician attestation form completed to help him get extra benefit from his insurance.  Assisted physician in submitting form for review.  Andriette HILARIO Leech, LCSW Clinical Social Worker [facility] Desk#: [phone] Cell#: [phone]

## 2024-05-04 ENCOUNTER — Ambulatory Visit

## 2024-08-03 ENCOUNTER — Ambulatory Visit

## 2024-11-02 ENCOUNTER — Ambulatory Visit

## 2025-02-01 ENCOUNTER — Ambulatory Visit
